# Patient Record
Sex: Male | Born: 1945 | Race: Black or African American | Hispanic: No | Marital: Married | Smoking: Former smoker
Health system: Southern US, Community
[De-identification: ages and names within clinical notes are randomized; demographics above are authoritative.]

## PROBLEM LIST (undated history)

## (undated) DIAGNOSIS — I219 Acute myocardial infarction, unspecified: Secondary | ICD-10-CM

## (undated) DIAGNOSIS — I509 Heart failure, unspecified: Secondary | ICD-10-CM

## (undated) DIAGNOSIS — K219 Gastro-esophageal reflux disease without esophagitis: Secondary | ICD-10-CM

## (undated) DIAGNOSIS — I10 Essential (primary) hypertension: Secondary | ICD-10-CM

## (undated) DIAGNOSIS — I739 Peripheral vascular disease, unspecified: Secondary | ICD-10-CM

## (undated) DIAGNOSIS — E785 Hyperlipidemia, unspecified: Secondary | ICD-10-CM

## (undated) DIAGNOSIS — E559 Vitamin D deficiency, unspecified: Secondary | ICD-10-CM

## (undated) DIAGNOSIS — I214 Non-ST elevation (NSTEMI) myocardial infarction: Secondary | ICD-10-CM

## (undated) DIAGNOSIS — I2510 Atherosclerotic heart disease of native coronary artery without angina pectoris: Secondary | ICD-10-CM

## (undated) HISTORY — DX: Non-ST elevation (NSTEMI) myocardial infarction: I21.4

## (undated) HISTORY — DX: Peripheral vascular disease, unspecified: I73.9

## (undated) HISTORY — DX: Gastro-esophageal reflux disease without esophagitis: K21.9

## (undated) HISTORY — DX: Hyperlipidemia, unspecified: E78.5

---
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
## 2001-07-07 ENCOUNTER — Ambulatory Visit (HOSPITAL_COMMUNITY): Admission: RE | Admit: 2001-07-07 | Discharge: 2001-07-07 | Payer: Self-pay | Admitting: Pulmonary Disease

## 2001-07-22 ENCOUNTER — Ambulatory Visit (HOSPITAL_COMMUNITY): Admission: RE | Admit: 2001-07-22 | Discharge: 2001-07-22 | Payer: Self-pay | Admitting: Pulmonary Disease

## 2001-07-26 ENCOUNTER — Ambulatory Visit (HOSPITAL_COMMUNITY): Admission: RE | Admit: 2001-07-26 | Discharge: 2001-07-26 | Payer: Self-pay | Admitting: Internal Medicine

## 2001-10-26 ENCOUNTER — Ambulatory Visit (HOSPITAL_COMMUNITY): Admission: RE | Admit: 2001-10-26 | Discharge: 2001-10-26 | Payer: Self-pay | Admitting: Pulmonary Disease

## 2001-11-05 ENCOUNTER — Ambulatory Visit (HOSPITAL_COMMUNITY): Admission: RE | Admit: 2001-11-05 | Discharge: 2001-11-05 | Payer: Self-pay | Admitting: Pulmonary Disease

## 2003-03-08 ENCOUNTER — Ambulatory Visit (HOSPITAL_COMMUNITY): Admission: RE | Admit: 2003-03-08 | Discharge: 2003-03-08 | Payer: Self-pay | Admitting: Urology

## 2003-03-08 ENCOUNTER — Encounter: Payer: Self-pay | Admitting: Urology

## 2004-12-16 ENCOUNTER — Ambulatory Visit (HOSPITAL_COMMUNITY): Admission: RE | Admit: 2004-12-16 | Discharge: 2004-12-16 | Payer: Self-pay | Admitting: *Deleted

## 2004-12-23 HISTORY — PX: CARDIAC CATHETERIZATION: SHX172

## 2005-02-27 ENCOUNTER — Ambulatory Visit (HOSPITAL_COMMUNITY): Admission: RE | Admit: 2005-02-27 | Discharge: 2005-02-28 | Payer: Self-pay | Admitting: Cardiovascular Disease

## 2005-02-27 HISTORY — PX: FEMORAL ARTERY STENT: SHX1583

## 2005-09-29 ENCOUNTER — Ambulatory Visit (HOSPITAL_COMMUNITY): Admission: RE | Admit: 2005-09-29 | Discharge: 2005-09-29 | Payer: Self-pay | Admitting: Internal Medicine

## 2007-08-25 ENCOUNTER — Encounter: Payer: Self-pay | Admitting: Emergency Medicine

## 2007-08-25 ENCOUNTER — Observation Stay (HOSPITAL_COMMUNITY): Admission: AD | Admit: 2007-08-25 | Discharge: 2007-08-26 | Payer: Self-pay | Admitting: Cardiology

## 2007-08-26 HISTORY — PX: CARDIAC CATHETERIZATION: SHX172

## 2008-01-14 ENCOUNTER — Ambulatory Visit (HOSPITAL_COMMUNITY): Admission: RE | Admit: 2008-01-14 | Discharge: 2008-01-14 | Payer: Self-pay | Admitting: Pulmonary Disease

## 2008-05-27 ENCOUNTER — Emergency Department (HOSPITAL_COMMUNITY): Admission: EM | Admit: 2008-05-27 | Discharge: 2008-05-27 | Payer: Self-pay | Admitting: Emergency Medicine

## 2008-08-09 ENCOUNTER — Ambulatory Visit (HOSPITAL_COMMUNITY): Admission: RE | Admit: 2008-08-09 | Discharge: 2008-08-09 | Payer: Self-pay | Admitting: Urology

## 2009-12-04 ENCOUNTER — Ambulatory Visit (HOSPITAL_COMMUNITY): Admission: RE | Admit: 2009-12-04 | Discharge: 2009-12-04 | Payer: Self-pay | Admitting: General Surgery

## 2009-12-09 HISTORY — PX: COLONOSCOPY: SHX174

## 2010-01-01 ENCOUNTER — Ambulatory Visit (HOSPITAL_COMMUNITY): Admission: RE | Admit: 2010-01-01 | Discharge: 2010-01-01 | Payer: Self-pay | Admitting: Pulmonary Disease

## 2010-01-10 ENCOUNTER — Ambulatory Visit: Payer: Self-pay | Admitting: Internal Medicine

## 2010-01-10 ENCOUNTER — Ambulatory Visit (HOSPITAL_COMMUNITY): Admission: RE | Admit: 2010-01-10 | Discharge: 2010-01-10 | Payer: Self-pay | Admitting: Internal Medicine

## 2010-01-10 DIAGNOSIS — I739 Peripheral vascular disease, unspecified: Secondary | ICD-10-CM | POA: Insufficient documentation

## 2010-01-10 DIAGNOSIS — K219 Gastro-esophageal reflux disease without esophagitis: Secondary | ICD-10-CM

## 2010-01-10 DIAGNOSIS — M79609 Pain in unspecified limb: Secondary | ICD-10-CM | POA: Insufficient documentation

## 2010-02-19 ENCOUNTER — Ambulatory Visit: Payer: Self-pay | Admitting: Internal Medicine

## 2010-02-19 ENCOUNTER — Ambulatory Visit (HOSPITAL_COMMUNITY): Admission: RE | Admit: 2010-02-19 | Discharge: 2010-02-19 | Payer: Self-pay | Admitting: Internal Medicine

## 2010-02-19 HISTORY — PX: ESOPHAGOGASTRODUODENOSCOPY: SHX1529

## 2010-06-13 ENCOUNTER — Encounter (INDEPENDENT_AMBULATORY_CARE_PROVIDER_SITE_OTHER): Payer: Self-pay | Admitting: *Deleted

## 2010-08-27 ENCOUNTER — Emergency Department (HOSPITAL_COMMUNITY)
Admission: EM | Admit: 2010-08-27 | Discharge: 2010-08-28 | Payer: Self-pay | Source: Home / Self Care | Admitting: Emergency Medicine

## 2010-08-28 ENCOUNTER — Observation Stay (HOSPITAL_COMMUNITY)
Admission: EM | Admit: 2010-08-28 | Discharge: 2010-08-29 | Payer: Self-pay | Source: Home / Self Care | Attending: Cardiovascular Disease | Admitting: Cardiovascular Disease

## 2010-08-28 ENCOUNTER — Encounter (INDEPENDENT_AMBULATORY_CARE_PROVIDER_SITE_OTHER): Payer: Self-pay | Admitting: Cardiovascular Disease

## 2010-08-28 DIAGNOSIS — I2510 Atherosclerotic heart disease of native coronary artery without angina pectoris: Secondary | ICD-10-CM

## 2010-08-28 HISTORY — DX: Atherosclerotic heart disease of native coronary artery without angina pectoris: I25.10

## 2010-08-28 HISTORY — PX: CORONARY STENT INTERVENTION: CATH118234

## 2010-08-28 HISTORY — PX: CARDIAC CATHETERIZATION: SHX172

## 2010-08-28 HISTORY — PX: LEFT HEART CATH AND CORONARY ANGIOGRAPHY: CATH118249

## 2010-08-28 LAB — DIFFERENTIAL
Basophils Absolute: 0 10*3/uL (ref 0.0–0.1)
Basophils Relative: 0 % (ref 0–1)
Eosinophils Absolute: 0.1 10*3/uL (ref 0.0–0.7)
Eosinophils Relative: 1 % (ref 0–5)
Lymphocytes Relative: 53 % — ABNORMAL HIGH (ref 12–46)
Lymphs Abs: 3 10*3/uL (ref 0.7–4.0)
Monocytes Absolute: 0.3 10*3/uL (ref 0.1–1.0)
Monocytes Relative: 5 % (ref 3–12)
Neutro Abs: 2.4 10*3/uL (ref 1.7–7.7)
Neutrophils Relative %: 42 % — ABNORMAL LOW (ref 43–77)

## 2010-08-28 LAB — CBC
HCT: 38.6 % — ABNORMAL LOW (ref 39.0–52.0)
Hemoglobin: 13.6 g/dL (ref 13.0–17.0)
MCH: 32.9 pg (ref 26.0–34.0)
MCHC: 35.2 g/dL (ref 30.0–36.0)
MCV: 93.2 fL (ref 78.0–100.0)
Platelets: 231 10*3/uL (ref 150–400)
RBC: 4.14 MIL/uL — ABNORMAL LOW (ref 4.22–5.81)
RDW: 12.6 % (ref 11.5–15.5)
WBC: 5.8 10*3/uL (ref 4.0–10.5)

## 2010-08-28 LAB — POCT CARDIAC MARKERS
CKMB, poc: <1 ng/mL — ABNORMAL LOW (ref 1.0–8.0)
CKMB, poc: 1.5 ng/mL (ref 1.0–8.0)
Myoglobin, poc: 128 ng/mL (ref 12–200)
Myoglobin, poc: 131 ng/mL (ref 12–200)
Troponin i, poc: <0.05 ng/mL (ref 0.00–0.09)
Troponin i, poc: <0.05 ng/mL (ref 0.00–0.09)

## 2010-08-28 LAB — BASIC METABOLIC PANEL
BUN: 12 mg/dL (ref 6–23)
CO2: 27 mEq/L (ref 19–32)
Calcium: 10 mg/dL (ref 8.4–10.5)
Chloride: 105 mEq/L (ref 96–112)
Creatinine, Ser: 1.41 mg/dL (ref 0.4–1.5)
GFR calc Af Amer: >60 mL/min (ref >60)
GFR calc non Af Amer: 51 mL/min — ABNORMAL LOW (ref >60)
Glucose, Bld: 97 mg/dL (ref 70–99)
Potassium: 4.2 mEq/L (ref 3.5–5.1)
Sodium: 138 mEq/L (ref 135–145)

## 2010-08-28 LAB — PROTIME-INR
INR: 1.02 (ref 0.00–1.49)
Prothrombin Time: 13.6 seconds (ref 11.6–15.2)

## 2010-08-28 LAB — APTT: aPTT: 29 seconds (ref 24–37)

## 2010-08-28 LAB — D-DIMER, QUANTITATIVE: D-Dimer, Quant: 0.22 ug/mL-FEU (ref 0.00–0.48)

## 2010-09-02 LAB — GLUCOSE, CAPILLARY
Glucose-Capillary: 100 mg/dL — ABNORMAL HIGH (ref 70–99)
Glucose-Capillary: 157 mg/dL — ABNORMAL HIGH (ref 70–99)
Glucose-Capillary: 202 mg/dL — ABNORMAL HIGH (ref 70–99)
Glucose-Capillary: 113 mg/dL — ABNORMAL HIGH (ref 70–99)
Glucose-Capillary: 62 mg/dL — ABNORMAL LOW (ref 70–99)
Glucose-Capillary: 125 mg/dL — ABNORMAL HIGH (ref 70–99)
Glucose-Capillary: 126 mg/dL — ABNORMAL HIGH (ref 70–99)
Glucose-Capillary: 171 mg/dL — ABNORMAL HIGH (ref 70–99)

## 2010-09-02 LAB — CBC
HCT: 39.2 % (ref 39.0–52.0)
HCT: 44.2 % (ref 39.0–52.0)
Hemoglobin: 13 g/dL (ref 13.0–17.0)
Hemoglobin: 14.9 g/dL (ref 13.0–17.0)
MCH: 31.7 pg (ref 26.0–34.0)
MCHC: 33.2 g/dL (ref 30.0–36.0)
MCHC: 33.7 g/dL (ref 30.0–36.0)
MCV: 95.6 fL (ref 78.0–100.0)
Platelets: 234 10*3/uL (ref 150–400)
RBC: 4.1 MIL/uL — ABNORMAL LOW (ref 4.22–5.81)
RDW: 12.5 % (ref 11.5–15.5)
WBC: 5.8 10*3/uL (ref 4.0–10.5)
WBC: 7.3 10*3/uL (ref 4.0–10.5)

## 2010-09-02 LAB — BASIC METABOLIC PANEL
Calcium: 9.7 mg/dL (ref 8.4–10.5)
GFR calc Af Amer: 59 mL/min — ABNORMAL LOW (ref >60)
GFR calc non Af Amer: 49 mL/min — ABNORMAL LOW (ref >60)
Potassium: 3.8 mEq/L (ref 3.5–5.1)
Sodium: 137 mEq/L (ref 135–145)

## 2010-09-02 LAB — MRSA PCR SCREENING: MRSA by PCR: NEGATIVE

## 2010-09-02 LAB — T4, FREE: Free T4: 0.89 ng/dL (ref 0.80–1.80)

## 2010-09-02 LAB — CARDIAC PANEL(CRET KIN+CKTOT+MB+TROPI)
CK, MB: 3.4 ng/mL (ref 0.3–4.0)
CK, MB: 3.5 ng/mL (ref 0.3–4.0)
Relative Index: 1 (ref 0.0–2.5)
Total CK: 324 U/L — ABNORMAL HIGH (ref 7–232)
Troponin I: 0.01 ng/mL (ref 0.00–0.06)

## 2010-09-02 LAB — TSH: TSH: 1.218 u[IU]/mL (ref 0.350–4.500)

## 2010-09-02 LAB — MAGNESIUM: Magnesium: 2 mg/dL (ref 1.5–2.5)

## 2010-09-02 LAB — HEMOGLOBIN A1C: Mean Plasma Glucose: 140 mg/dL — ABNORMAL HIGH (ref <117)

## 2010-09-02 LAB — BRAIN NATRIURETIC PEPTIDE: Pro B Natriuretic peptide (BNP): <30 pg/mL (ref 0.0–100.0)

## 2010-09-05 LAB — POCT ACTIVATED CLOTTING TIME: Activated Clotting Time: 435 seconds

## 2010-09-10 ENCOUNTER — Observation Stay (HOSPITAL_COMMUNITY)
Admission: EM | Admit: 2010-09-10 | Discharge: 2010-09-11 | Disposition: A | Discharge status: Home / Self Care | Payer: 59 | Attending: Internal Medicine | Admitting: Internal Medicine

## 2010-09-10 DIAGNOSIS — R079 Chest pain, unspecified: Principal | ICD-10-CM | POA: Insufficient documentation

## 2010-09-10 DIAGNOSIS — E785 Hyperlipidemia, unspecified: Secondary | ICD-10-CM | POA: Insufficient documentation

## 2010-09-10 DIAGNOSIS — R0602 Shortness of breath: Secondary | ICD-10-CM | POA: Insufficient documentation

## 2010-09-10 DIAGNOSIS — E119 Type 2 diabetes mellitus without complications: Secondary | ICD-10-CM | POA: Insufficient documentation

## 2010-09-10 DIAGNOSIS — J449 Chronic obstructive pulmonary disease, unspecified: Secondary | ICD-10-CM | POA: Insufficient documentation

## 2010-09-10 DIAGNOSIS — I509 Heart failure, unspecified: Secondary | ICD-10-CM | POA: Insufficient documentation

## 2010-09-10 DIAGNOSIS — I2510 Atherosclerotic heart disease of native coronary artery without angina pectoris: Secondary | ICD-10-CM | POA: Insufficient documentation

## 2010-09-10 DIAGNOSIS — J4489 Other specified chronic obstructive pulmonary disease: Secondary | ICD-10-CM | POA: Insufficient documentation

## 2010-09-10 DIAGNOSIS — R42 Dizziness and giddiness: Secondary | ICD-10-CM | POA: Insufficient documentation

## 2010-09-10 LAB — COMPREHENSIVE METABOLIC PANEL WITH GFR
ALT: 21 U/L (ref 0–53)
AST: 22 U/L (ref 0–37)
Albumin: 4 g/dL (ref 3.5–5.2)
Alkaline Phosphatase: 39 U/L (ref 39–117)
Chloride: 104 meq/L (ref 96–112)
GFR calc Af Amer: >60 mL/min (ref >60)
Potassium: 3.8 meq/L (ref 3.5–5.1)
Sodium: 137 meq/L (ref 135–145)
Total Bilirubin: 1.1 mg/dL (ref 0.3–1.2)

## 2010-09-10 LAB — URINALYSIS, ROUTINE W REFLEX MICROSCOPIC
Ketones, ur: NEGATIVE mg/dL
Nitrite: NEGATIVE
Protein, ur: NEGATIVE mg/dL
Urine Glucose, Fasting: NEGATIVE mg/dL
Urobilinogen, UA: 0.2 mg/dL (ref 0.0–1.0)

## 2010-09-10 LAB — CBC
HCT: 37.4 % — ABNORMAL LOW (ref 39.0–52.0)
Hemoglobin: 12.9 g/dL — ABNORMAL LOW (ref 13.0–17.0)
Hemoglobin: 12.3 g/dL — ABNORMAL LOW (ref 13.0–17.0)
MCH: 31.9 pg (ref 26.0–34.0)
MCHC: 34.5 g/dL (ref 30.0–36.0)
MCHC: 34 g/dL (ref 30.0–36.0)
MCV: 92.6 fL (ref 78.0–100.0)
Platelets: 206 K/uL (ref 150–400)
Platelets: 184 10*3/uL (ref 150–400)
RBC: 4.04 MIL/uL — ABNORMAL LOW (ref 4.22–5.81)
RDW: 12.2 % (ref 11.5–15.5)
RDW: 12.4 % (ref 11.5–15.5)
WBC: 5.6 10*3/uL (ref 4.0–10.5)

## 2010-09-10 LAB — DIFFERENTIAL
Basophils Absolute: 0 10*3/uL (ref 0.0–0.1)
Basophils Relative: 0 % (ref 0–1)
Eosinophils Absolute: 0 K/uL (ref 0.0–0.7)
Eosinophils Relative: 0 % (ref 0–5)
Lymphocytes Relative: 35 % (ref 12–46)
Lymphs Abs: 2 K/uL (ref 0.7–4.0)
Monocytes Absolute: 0.3 10*3/uL (ref 0.1–1.0)
Monocytes Relative: 6 % (ref 3–12)
Neutro Abs: 3.3 10*3/uL (ref 1.7–7.7)
Neutrophils Relative %: 59 % (ref 43–77)

## 2010-09-10 LAB — COMPREHENSIVE METABOLIC PANEL
BUN: 11 mg/dL (ref 6–23)
CO2: 26 mEq/L (ref 19–32)
Calcium: 9.9 mg/dL (ref 8.4–10.5)
Creatinine, Ser: 1.05 mg/dL (ref 0.4–1.5)
GFR calc non Af Amer: >60 mL/min (ref >60)
Glucose, Bld: 138 mg/dL — ABNORMAL HIGH (ref 70–99)
Total Protein: 6.9 g/dL (ref 6.0–8.3)

## 2010-09-10 LAB — PROTIME-INR
INR: 1.1 (ref 0.00–1.49)
Prothrombin Time: 14.4 seconds (ref 11.6–15.2)

## 2010-09-10 LAB — APTT: aPTT: 26 seconds (ref 24–37)

## 2010-09-10 LAB — POCT CARDIAC MARKERS
CKMB, poc: 1.7 ng/mL (ref 1.0–8.0)
Myoglobin, poc: 77.2 ng/mL (ref 12–200)

## 2010-09-10 LAB — TROPONIN I: Troponin I: <0.01 ng/mL (ref 0.00–0.06)

## 2010-09-10 LAB — GLUCOSE, CAPILLARY
Glucose-Capillary: 130 mg/dL — ABNORMAL HIGH (ref 70–99)
Glucose-Capillary: 140 mg/dL — ABNORMAL HIGH (ref 70–99)

## 2010-09-10 NOTE — Letter (Signed)
Summary: EGD ORDER  EGD ORDER   Imported By: Ave Filter 01/10/2010 11:53:33  _____________________________________________________________________  External Attachment:    Type:   Image     Comment:   External Document

## 2010-09-10 NOTE — Letter (Signed)
Summary: STAT DOPPLER LEG ORDER  STAT DOPPLER LEG ORDER   Imported By: Ave Filter 01/10/2010 11:52:44  _____________________________________________________________________  External Attachment:    Type:   Image     Comment:   External Document

## 2010-09-10 NOTE — Letter (Signed)
Summary: Recall Office Visit  [facility]  [address]   [place], [place] [address]   Phone: [phone]  Fax: [phone]      June 13, 2010   [name] [address] [place], [place]  [address] 13-Aug-1945   Dear [name],   According to our records, it is time for you to schedule a follow-up office visit with [place].   At your convenience, please call [phone] to schedule an office visit. If you have any questions, concerns, or feel that this letter is in error, we would appreciate your call.   Sincerely,    Diana Eves  [facility] Ph: [phone]   Fax: [phone]

## 2010-09-10 NOTE — Assessment & Plan Note (Signed)
Summary: ACID REFLUX,HURTS TO SWALLOW/SS   Visit Type:  Initial Consult Referring Provider:  Dr Juanetta Gosling Primary Care Provider:  Dr Rulon Sera  Chief Complaint:  acid reflux and burning in throat.  History of Present Illness: 65 y/o black male w/ hx GERD over 10 yrs.  Well-controlled on pantroprazole x 5 yrs.  Started fenofribic acid x 55month ago.  Started having breakthrough chest pain, indigestion, heartburn worse than ever before.  Saw Dr Juanetta Gosling and took z-pack & prednisone, off x 1 week now.  Lots of belching.  Denies dysphagia or odynophagia.  c/o nausea without vomiting.  Occ has regurg.  Appetite ok.  Denies lower abd pain.  BM q1-2 days, without rectal bleeding or melena.  Wt loss 15# intentionally w/ walking 3 mi per day & cutting back on calories.  Taking Gaviscon & pepcid w good relief.  Cut back on daily caffeine use.  Also c/o severe left calf pain, worse w/ movement x 3 days.  Denies redness, swelling or warmth.  Has not been seen or told Dr Juanetta Gosling yet.  Hx PVD, followed by Dr Allyson Sabal.    Current Problems (verified): 1)  Pvd  (ICD-443.9) 2)  Gastroesophageal Reflux Disease, Severe  (ICD-530.81) 3)  Calf Pain, Left  (ICD-729.5)  Current Medications (verified): 1)  Ramipril 5 Mg Caps (Ramipril) .... Once Daily 2)  Glyburide-Metformin 5-500 Mg Tabs (Glyburide-Metformin) .... Once Daily 3)  Plavix 75 Mg Tabs (Clopidogrel Bisulfate) .... Once Daily 4)  Fexofenadine Hcl 180 Mg Tabs (Fexofenadine Hcl) .... Once Daily 5)  Pantoprazole Sodium 40 Mg Tbec (Pantoprazole Sodium) .... Once Daily 6)  Trilipix 135 Mg Cpdr (Choline Fenofibrate) .... Once Daily  Allergies (verified): 1)  ! Darvocet 2)  ! Pcn 3)  ! Diflucan (Fluconazole)  Past History:  Past Medical History: PVD s/p stent left leg Diabetes Hyperlipidemia Hypertension Colonoscopy Dr Lovell Sheehan 12/2009->normal 2002 Dr Elmer Ramp GERD  Past Surgical History: Unremarkable  Family History: No known family history of  colorectal carcinoma, IBD, liver or chronic GI problems.  Social History: married 3 grown healthy children [employer] Alcohol Use - no Daily Caffeine Use Patient is a former smoker. 50 pkyr, quit 23yrs ago Illicit Drug Use - no Patient gets regular exercise. Smoking Status:  quit Drug Use:  no Does Patient Exercise:  yes  Review of Systems General:  Complains of fatigue; denies fever, chills, sweats, anorexia, weakness, malaise, weight loss, and sleep disorder. CV:  Denies angina, palpitations, syncope, dyspnea on exertion, orthopnea, PND, peripheral edema, and claudication. Resp:  Denies dyspnea at rest, dyspnea with exercise, cough, sputum, wheezing, coughing up blood, and pleurisy. GI:  Denies difficulty swallowing, pain on swallowing, jaundice, gas/bloating, bloody BM's, black BMs, and fecal incontinence. GU:  Denies urinary burning, blood in urine, urinary frequency, urinary hesitancy, nocturnal urination, and urinary incontinence. MS:  Denies joint pain / LOM, joint swelling, joint stiffness, joint deformity, low back pain, muscle weakness, muscle cramps, muscle atrophy, leg pain at night, leg pain with exertion, and shoulder pain / LOM hand / wrist pain (CTS); c/o left calf pain x 3 days, no swelling or erythema. Derm:  Denies rash, itching, dry skin, hives, moles, warts, and unhealing ulcers. Psych:  Denies depression, anxiety, memory loss, suicidal ideation, hallucinations, paranoia, phobia, and confusion. Heme:  Denies bruising, bleeding, and enlarged lymph nodes.  Vital Signs:  Patient profile:   65 year old male Height:      72 inches Weight:      225 pounds  BMI:     30.63 Temp:     97.7 degrees F oral Pulse rate:   60 / minute BP sitting:   128 / 84  (left arm) Cuff size:   regular  Vitals Entered By: Hendricks Limes LPN (January 10, 1609 11:02 AM)  Physical Exam  General:  Well developed, well nourished, no acute distress. Head:  Normocephalic and  atraumatic. Eyes:  Sclera clear, no icterus. Ears:  Normal auditory acuity. Nose:  No deformity, discharge,  or lesions. Mouth:  No deformity or lesions, dentition normal. Neck:  Supple; no masses or thyromegaly. Lungs:  Clear throughout to auscultation. Heart:  Regular rate and rhythm; no murmurs, rubs,  or bruits. Abdomen:  Soft, nontender and nondistended. No masses, hepatosplenomegaly or hernias noted. Normal bowel sounds.without guarding and without rebound.   Msk:  Symmetrical with no gross deformities. Normal posture. Pulses:  Normal pulses noted. Extremities:  Post calf tenderness on exam left LE, no warmth or erythema.  Negative Homan's.   trace ankle edma B/ Neurologic:  Alert and  oriented x4;  grossly normal neurologically. Skin:  Intact without significant lesions or rashes. Cervical Nodes:  No significant cervical adenopathy. Psych:  Alert and cooperative. Normal mood and affect.  Impression & Recommendations:  Problem # 1:  GASTROESOPHAGEAL REFLUX DISEASE, SEVERE (ICD-530.81) 65 y/o black male here for evaluation of refractory GERD.  Will need EGD to r/o erosive/ulcerative esophagitis, PUD & malignancy.  EGD to be performed by Dr. Jonathon Bellows in the near future.  I have discussed risks and benefits which include, but are not limited to, bleeding, infection, perforation, or medication reaction.  The patient agrees with this plan and consent will be obtained.  Orders: Consultation Level III (96045)  Problem # 2:  CALF PAIN, LEFT (ICD-729.5) Stat doppler Ultrasound today to r/o DVT Left lower extremity at [facility] revealed SVT greater saphenous.  I contacted Dr Juanetta Gosling @1330  who will follow-up w/ management.

## 2010-09-11 ENCOUNTER — Observation Stay (HOSPITAL_COMMUNITY): Payer: 59

## 2010-09-11 HISTORY — PX: CARDIAC CATHETERIZATION: SHX172

## 2010-09-11 HISTORY — PX: LEFT HEART CATH AND CORONARY ANGIOGRAPHY: CATH118249

## 2010-09-11 LAB — CARDIAC PANEL(CRET KIN+CKTOT+MB+TROPI): Troponin I: <0.01 ng/mL (ref 0.00–0.06)

## 2010-09-11 LAB — GLUCOSE, CAPILLARY
Glucose-Capillary: 128 mg/dL — ABNORMAL HIGH (ref 70–99)
Glucose-Capillary: 149 mg/dL — ABNORMAL HIGH (ref 70–99)
Glucose-Capillary: 153 mg/dL — ABNORMAL HIGH (ref 70–99)

## 2010-09-11 LAB — CBC
MCHC: 33.3 g/dL (ref 30.0–36.0)
Platelets: 177 10*3/uL (ref 150–400)
RDW: 12.7 % (ref 11.5–15.5)
WBC: 5.8 10*3/uL (ref 4.0–10.5)

## 2010-09-11 LAB — MRSA PCR SCREENING: MRSA by PCR: NEGATIVE

## 2010-09-16 NOTE — Procedures (Signed)
NAMEOVIE, CORNELIO NO.:  [id number]  MEDICAL RECORD NO.:  [id number]           PATIENT TYPE:  I  LOCATION:  2927                         FACILITY:  [facility]  PHYSICIAN:  Landry Corporal, MD DATE OF BIRTH:  June 13, 1946  DATE OF PROCEDURE:  09/11/2010 DATE OF DISCHARGE:  09/11/2010                           CARDIAC CATHETERIZATION   PRIMARY CARDIOLOGIST:  Nanetta Batty, MD  PRIMARY CARE PHYSICIAN:  Annia Friendly. Hill, MD in [place], [place].  PROCEDURE PERFORMED:  Selective native coronary angiography.  INDICATIONS:  Chest pain status post recent PCI.  BRIEF HISTORY:  Mr. Valdes is a 65 year old gentleman with a history of peripheral vascular disease status post stenting two of his SFAs in the past.  He was admitted on the August 27, 2010, with signs and symptoms concerning for unstable angina.  He did rule out for myocardial infarction, but he did undergo cardiac catheterization where he was noted to have a 99% stenosis in the first obtuse marginal branch of a large circumflex system.  This was treated with a Mini Vision 2.0 x 50 mm bare-metal stent of size 2.25 mm with a noncompliant balloon.  The patient has noted having some nagging chest discomfort since discharge, and came in the day preceding this procedure with the more episodes of chest discomfort.  He is referred for diagnostic catheterization to reassess the status of his recent stent that it shows no complications.  The procedure with risks, benefits, alternatives, and indication of procedure were explained to the patient in detail.  Informed consent was obtained with a signed form placed on the chart, and the patient's questions answered.  PROCEDURE:  The patient was brought to the second floor of [facility] cardiac  catheterization lab, prepped and draped in usual sterile fashion.  After a modified Allen/Barbeau, the study was performed demonstrating adequate perfusion of the right hand via the  ulnar artery collaterals.  The right wrist was then draped out for radial access. The patient was sedated with intravenous Versed and fentanyl, and the right wrist was anesthetized using 1% subcutaneous lidocaine, and the right radial artery was accessed using the Seldinger technique with placement of 5-French Glidesheath via micropuncture access.  The sheath was aspirated, flushed, and infiltrated with 10 mL of standard radial cocktail as described below.  The patient had been given treatment of enoxaparin subcutaneously in the morning and therefore was not given any additional anticoagulation for the procedure.  A 5-French TIG 4.0 diagnostic catheter was advanced over a wire into the ascending aorta and was used to engage first the left and right coronary artery.  Multiple angiographic views of the left and right coronary artery system were obtained.  The catheter was then removed completely out of the body over a wire without any complications.  Left heart catheterization with left ventriculogram was not performed as he just had one done recently.  There is no evidence of pullback gradient at that time, and there was normal ejection fraction noted on left ventriculogram.  After the catheter was removed completely out of the body, the sheath was removed with a TR band  placed at 12 o'clock noon with 15 mL of air.  The plan will be for this to be removed sccording to the post PCI protocol, since the patient was given treatment doses of Lovenox.  CATHETERIZATION STATISTICS: 1. For sedation, a total of 1 mg IV Versed and 50 mcg of IV fentanyl     were given. 2. Contrast, 30 mL was given. 3. Radial cocktail consisting of 400 mcg of nitroglycerin, 2 mL of 1%     lidocaine, and 5 mg of Versed diluted in 10 mL of normal saline.  HEMODYNAMICS:  The aortic pressure was 88/59 mmHg with a mean of  72 mmHg.  SELECTIVE CORONARY ANGIOGRAPHY DATA: 1. Left main was a short vessel that bifurcates  immediately into a     large caliber LAD and then circumflex branch. 2. LAD is angiographically normal and is very tortuous in this     proximal nature.  It gives rise to two diagonal branches.  The     second of which is more prominent.  There is a slight myocardial     bridging just after the second diagonal branch, but no significant     stenosis noted.  There is no significant angiographic disease in     the LAD system. 3. Left circumflex gives rise to several marked large caliber obtuse     marginal branches, the first of which underwent PCI on August 28, 2010.  The stent in this vessel was widely patent with brisk TIMI 3     flow.  There is mild impingement of a small superior branch in     this, but there is still TIMI 3 flow in this vessel.  The rest of     the circumflex system has minimal luminal irregularities in the     interventricular groove circumflex goes on to provide what appeared     to be part of the left posterolateral system in almost a codominant     manner.  The second obtuse marginal is a large branching vessel     reached down near the inferior border of the heart and is free of     any significant disease. 4. Right coronary artery is a moderate caliber vessel.  It does appear     to be dominant, and has only minimal luminal irregularities and a     small posterolateral system, and posterior descending artery.  IMPRESSION: 1. Widely patent bare-metal stent to the first obtuse marginal with     TIMI 3 flow distally. 2. No notable additional coronary artery disease to suggest any     coronary artery disease explanation for his chest pain as an     anginal equivalent.  RECOMMENDATIONS: 1. He will receive a standard post radial care, and the TR band     removed according to the post PCI protocol based on his Lovenox     treatment.  He will be transferred to the 2900 step-down unit just     for bed availability.  We will likely discharge later on today,  and     he will follow up with Dr. Allyson Sabal in a week or two at the [facility]     office at the [facility]. 2. He also was borderline hypotensive and somewhat dizzy with that.     We may actually need to back off his beta-blocker dose and     potentially discontinue the beta-blocker dose on discharge  as he     was notably dizzy with this prior to admission.          ______________________________ Landry Corporal, MD     DWH/MEDQ  D:  09/11/2010  T:  09/12/2010  Job:  [id number]  cc:   Nanetta Batty, M.D. Annia Friendly. Loleta Chance, MD  Electronically Signed by Bryan Lemma MD on 09/16/2010 12:26:18 AM

## 2010-09-20 ENCOUNTER — Ambulatory Visit (HOSPITAL_COMMUNITY): Payer: 59 | Attending: Cardiovascular Disease

## 2010-09-20 DIAGNOSIS — Z9861 Coronary angioplasty status: Secondary | ICD-10-CM | POA: Insufficient documentation

## 2010-09-20 DIAGNOSIS — I2510 Atherosclerotic heart disease of native coronary artery without angina pectoris: Secondary | ICD-10-CM | POA: Insufficient documentation

## 2010-09-20 DIAGNOSIS — I200 Unstable angina: Secondary | ICD-10-CM | POA: Insufficient documentation

## 2010-09-20 DIAGNOSIS — Z5189 Encounter for other specified aftercare: Secondary | ICD-10-CM | POA: Insufficient documentation

## 2010-09-20 NOTE — Procedures (Signed)
NAMEADRIELL, [name]                 ACCOUNT NO.:  [id number]  MEDICAL RECORD NO.:  [id number]          PATIENT TYPE:  INP  LOCATION:  2925                         FACILITY:  [facility]  PHYSICIAN:  Nanetta Batty, M.D.   DATE OF BIRTH:  1946/05/27  DATE OF PROCEDURE:  08/28/2010 DATE OF DISCHARGE:                           CARDIAC CATHETERIZATION   PROCEDURE:  Cardiac catheterization/percutaneous coronary intervention stent placement.  OPERATOR:  Nanetta Batty, MD  INDICATIONS FOR PROCEDURE:  [name] is a 65 year old mildly overweight African American male admitted 1:30 this morning with unstable angina. His history is remarkable for known PVOD status post stenting of his SFAs.  His other problems include diabetes, hypertension, and hypertension.  He ruled out for myocardial infarction.  He presents now for diagnostic coronary arteriography to define his anatomy and rule out ischemic etiology.  DESCRIPTION OF PROCEDURE:  The patient was brought to the second floor [place] cardiac cath lab in a postabsorptive state.  He was premedicated with p.o. Valium, IV Versed, and fentanyl.  His right groin was prepped and shaved in the usual sterile fashion.  Xylocaine 1% was used for local anesthesia.  A 5-French sheath was inserted into the right femoral artery using the standard Seldinger technique.  A 5-French right and left Judkins diagnostic catheters as well as a 5-French pigtail catheter were used for selective coronary angiography, left ventriculography respectively.  Visipaque dye was used for the entirety of the case.  Retrograde aortic, left ventricular pullback pressures were recorded.  HEMODYNAMICS: 1. Aortic systolic pressure 98 and diastolic pressure 59. 2. Left ventricular systolic pressure 103 and end-diastolic pressure     12.  SELECTIVE CORONARY ANGIOGRAPHY: 1. Left main normal. 2. LAD normal. 3. Left circumflex; left circumflex gave off several moderate-sized   marginal branches.  The first marginal branch had 99% proximal     stenosis with TIMI 1 flow. 4. Right coronary; dominant and free of significant disease. 5. Left ventriculography; RAO left ventriculogram was performed using     25 mL of Visipaque dye at 12 mL per second.  The overall LVEF is     estimated at greater than 60% without focal wall motion     abnormalities.  IMPRESSION:  [name] has a high-grade obtuse marginal 1 lesion, probably responsible for symptoms.  We will proceed with percutaneous coronary intervention and stenting.  The 5-French sheath was exchanged over wire for a 6-French sheath.  The patient was on aspirin and Plavix, received an additional 300 mg of p.o. Plavix and Angiomax bolus with an ACT of 435.  Using a 6-French XB-4 guide catheter along with 0.014 x 190 [employer] soft wire and 2.0 x 12 TREK angioplasty was performed.  Stenting was performed with a 2.0 x 15 Mini Vision at 14 atmospheres and postdilatation with a 2.25 x 12 [place] TREK at 16 atmospheres (2.33 mm) resulting reduction of 99% stenosis to 0% residual.  The patient tolerated the procedure well without hemodynamic or electrocardiographic sequelae.  IMPRESSION:  Successful percutaneous coronary intervention and stenting of the obtuse marginal 1 using bare metal stent and Angiomax.  The guidewire  and catheter were removed.  The sheath was sewn securely in place.  The patient left the lab in stable condition.  He will be gently hydrated overnight, treated with aspirin and Plavix, and discharged home in the morning.  He left the lab in stable condition.     Nanetta Batty, M.D.     JB/MEDQ  D:  08/28/2010  T:  08/29/2010  Job:  [id number]  cc:   Second Floor [facility] Annia Friendly. Loleta Chance, MD  Electronically Signed by Nanetta Batty M.D. on 09/20/2010 08:06:30 AM

## 2010-09-20 NOTE — Discharge Summary (Signed)
[name], [name]                 ACCOUNT NO.:  [id number]  MEDICAL RECORD NO.:  [id number]           PATIENT TYPE:  I  LOCATION:  2927                         FACILITY:  [facility]  PHYSICIAN:  Nanetta Batty, M.D.   DATE OF BIRTH:  Jul 17, 1946  DATE OF ADMISSION:  09/10/2010 DATE OF DISCHARGE:  09/11/2010                              DISCHARGE SUMMARY   DISCHARGE DIAGNOSES: 1. Chest pain, worrisome for unstable angina, catheterization this     admission revealing patent first obtuse marginal site. 2. Known coronary artery disease with recent admission and unstable     angina with subsequent catheterization and first obtuse marginal     stenting with a drug-eluting stent on August 28, 2010. 3. Type 2 non-insulin-dependent diabetes. 4. Dyslipidemia with statin intolerance. 5. Preserved left ventricular function. 6. Chronic obstructive pulmonary disease by x-ray.  HOSPITAL COURSE:  [name] is a pleasant 65 year old male who was just admitted in January 2012, with unstable angina as a transfer from [facility].  He was cathed.  He had a 99% OM, but no other significant disease.  He underwent OM dilatation and stenting by Dr. Allyson Sabal on August 28, 2010.  He was discharged home a day or so later in stable condition.  We did feel he had some bronchitis and sent him home on Z- Pak for a couple of days.  He saw Dr. Rennis Golden in the office on September 09, 2010, and had complained of a nagging left-sided ache.  Dr. Rennis Golden reassured him that this was different from his anginal symptoms and the patient agreed with that assessment.  The next day on September 10, 2010, he mentioned to someone at work that he had left-sided chest pain.  They suggested that he take a nitroglycerin.  He did this and he says his symptoms got better and his coworkers suggested that he come to the emergency room.  He was seen in the emergency room.  His EKG showed no acute changes.  His enzymes were negative.  He was started  on Lovenox and admitted.  Initial plan was to proceed with a [facility], but the patient was seen by Dr. Herbie Baltimore the next morning on September 11, 2010, and was still having chest pain, so we decided to proceed with diagnostic catheterization.  This showed patency of the OM1 site.  The patient will be discharged later today.  He did have a radial cath.  He had received his Lovenox the morning of this case, so he will be kept until 2112 p.m.  We did add a very low-dose beta-blocker as he has baseline bradycardia.  LABORATORY DATA:  White count 5.8, hemoglobin 11.9, hematocrit 35.7, platelets 177.  Sodium 137, potassium 3.8, BUN 11, creatinine 1.05. LFTs are normal.  CK-MB and troponins are negative x4.  Urinalysis is unremarkable.  Chest x-ray shows COPD with no other acute changes.  EKG shows sinus rhythm, sinus bradycardia without acute changes.  DISPOSITION:  The patient is discharged in stable condition.  The patient wants to follow up with Dr. Allyson Sabal in [place] and this will be arranged.  Please see med  rec for complete discharge medications.     Abelino Derrick, P.A.   ______________________________ Nanetta Batty, M.D.    Lenard Lance  D:  09/11/2010  T:  09/12/2010  Job:  [id number]  Electronically Signed by Corine Shelter P.A. on 09/17/2010 12:28:25 PM Electronically Signed by Nanetta Batty M.D. on 09/20/2010 08:06:32 AM

## 2010-09-23 ENCOUNTER — Ambulatory Visit (HOSPITAL_COMMUNITY): Payer: 59 | Attending: Cardiovascular Disease

## 2010-09-23 DIAGNOSIS — Z5189 Encounter for other specified aftercare: Secondary | ICD-10-CM | POA: Insufficient documentation

## 2010-09-23 DIAGNOSIS — I200 Unstable angina: Secondary | ICD-10-CM | POA: Insufficient documentation

## 2010-09-23 DIAGNOSIS — I2510 Atherosclerotic heart disease of native coronary artery without angina pectoris: Secondary | ICD-10-CM | POA: Insufficient documentation

## 2010-09-23 DIAGNOSIS — Z9861 Coronary angioplasty status: Secondary | ICD-10-CM | POA: Insufficient documentation

## 2010-09-25 ENCOUNTER — Ambulatory Visit (HOSPITAL_COMMUNITY): Payer: 59 | Attending: Cardiovascular Disease

## 2010-09-25 DIAGNOSIS — Z5189 Encounter for other specified aftercare: Secondary | ICD-10-CM | POA: Insufficient documentation

## 2010-09-25 DIAGNOSIS — I2510 Atherosclerotic heart disease of native coronary artery without angina pectoris: Secondary | ICD-10-CM | POA: Insufficient documentation

## 2010-09-25 DIAGNOSIS — Z9861 Coronary angioplasty status: Secondary | ICD-10-CM | POA: Insufficient documentation

## 2010-09-25 DIAGNOSIS — I200 Unstable angina: Secondary | ICD-10-CM | POA: Insufficient documentation

## 2010-09-25 NOTE — H&P (Signed)
[name], [name]                 ACCOUNT NO.:  [id number]  MEDICAL RECORD NO.:  [id number]          PATIENT TYPE:  OBV  LOCATION:  1830                         FACILITY:  [facility]  PHYSICIAN:  [place] Kaylon Laroche, MD         DATE OF BIRTH:  1945/11/14  DATE OF ADMISSION:  09/10/2010 DATE OF DISCHARGE:                             HISTORY & PHYSICAL   CHIEF COMPLAINT:  Chest pain.  HISTORY OF PRESENT ILLNESS:  Mr. 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  He was recently admitted August 28, 2010 as a transfer from [facility].  He presented there with complaints of substernal chest pain and dyspnea on exertion for several days.  He was admitted and had diagnostic catheterization which revealed a 99% OM1 but no other significant disease.  The OM1 was dilated and stented by Dr. Allyson Sabal.  The patient felt improved but continues to have some nagging left upper chest pain.  He does not have dyspnea on exertion or exertional chest pain.  He does say when he takes nitroglycerin it feels better.  He mentioned this at work today and they told him to come to the emergency room where he is now seen.  His troponins are negative. He is admitted now for further evaluation.  PAST MEDICAL HISTORY:  Remarkable for: 1. Previous catheterization July 2009 which showed mild coronary     artery disease in the LAD. 2. He has had a previous left lower extremity PTA in 2005. 3. He has dyslipidemia but is intolerant to statins. 4. He has type 2 non-insulin-dependent diabetes.  MEDICATIONS:  His current medications are: 1. Allegra p.r.n. 2. Altace 5 mg a day. 3. Aspirin 325 mg a day. 4. Dexilant 30 mg a day. 5. Glyburide/metformin 5/500 b.i.d. 6. Lopressor 25 b.i.d. 7. Plavix 75 mg a day.  ALLERGIES:  He has reported allergies to PENICILLIN, DARVOCET, and LEVAQUIN.  SOCIAL HISTORY:  He is a nonsmoker, he is here with his son.  FAMILY HISTORY:  Unremarkable for immediate family coronary  artery disease.  REVIEW OF SYSTEMS:  Essentially unremarkable, except noted above.  PHYSICAL EXAMINATION:  VITAL SIGNS:  Blood pressure 140/70, pulse 86, temperature 98.3. GENERAL:  He is a well-developed, well-nourished African American male in no acute distress. HEENT: Normocephalic.  Extraocular movements are intact.  Sclerae are anicteric.  Lids and conjunctivae are within normal limits.  NECK: Without bruit or JVD. CHEST:  Clear to auscultation, percussion. CARDIAC:  Regular rate and rhythm without murmur, rub, or gallop. Normal S1. S2. ABDOMEN:  Not tender, not distended. EXTREMITIES:  Without edema.  He has a small area of ecchymosis in the right femoral artery site but no hematoma.  Distal pulses are intact. NEURO:  Grossly intact.  He is awake, alert, oriented, cooperative. Moves all extremities without obvious deficit. SKIN:  Cool and dry.  LABORATORY DATA:  Sodium 137, potassium 3.8, BUN 11, creatinine 1.05. White count 5.6, hemoglobin 12.9, hematocrit 37.4, platelets 206. Troponin is negative.  Chest x-ray shows COPD.  EKG shows sinus rhythm without acute changes.  IMPRESSION: 1.  Left chest pain, rule out cardiac. 2. Recent admission with unstable angina by history and subsequent     catheterization in obtuse marginal 1 stenting with a drug-eluting     stent. 3. Type 2 non-insulin-dependent diabetes. 4. Dyslipidemia with statin intolerance. 5. Renal insufficiency, his creatinine was 1.46 at discharge, although     today it is 1.05. 6. Preserved left ventricular function. 7. Chronic obstructive pulmonary disease by chest x-ray  PLAN:  The patient will be admitted for observation.  He may need diagnostic catheterization versus Lexiscan Myoview pending his enzymes.     Abelino Derrick, P.A.   ______________________________ [place] Annamary Buschman, MD    LKK/MEDQ  D:  09/10/2010  T:  09/10/2010  Job:  [id number]  cc:   Annia Friendly. Loleta Chance, MD  Electronically Signed by Corine Shelter P.A. on 09/17/2010 12:28:10 PM Electronically Signed by Kirtland Bouchard. Bryn Saline M.D. on 09/25/2010 10:10:08 AM

## 2010-09-25 NOTE — Discharge Summary (Signed)
  NAMECRAYTON, SAVARESE                 ACCOUNT NO.:  [id number]  MEDICAL RECORD NO.:  [id number]           PATIENT TYPE:  I  LOCATION:  2927                         FACILITY:  [facility]  PHYSICIAN:  [place] Anarie Kalish, MD         DATE OF BIRTH:  Feb 22, 1946  DATE OF ADMISSION:  09/10/2010 DATE OF DISCHARGE:  09/11/2010                              DISCHARGE SUMMARY   ADDENDUM  Please note, Mr. Catania is aware that he will be out of work, so he will not return to work until after his visit with Dr. Allyson Sabal in [place], September 20, 2010 at 1:45 p.m. and our office is faxing a work note to that office as I dictate this.  This will give Mr. Tucciarone a time to recover from his bronchitis and recover from any chest pain that he may have.  The patient is agreeable to that amount of time out of work.  He would like to do cardiac rehab, unsure if he will be able to with his work schedule.  If he is able to get into a rehab program the week of February 6, then he can go ahead and start cardiac rehab, but not to go to work until after February 10.     Darcella Gasman. Annie Paras, N.P.   ______________________________ [place] Gaelan Glennon, MD    LRI/MEDQ  D:  09/11/2010  T:  09/12/2010  Job:  [id number]  cc:   Nanetta Batty, M.D. Annia Friendly. Loleta Chance, MD  Electronically Signed by Nada Boozer N.P. on 09/17/2010 10:53:57 AM Electronically Signed by Kirtland Bouchard. Yocelin Vanlue M.D. on 09/25/2010 10:09:56 AM

## 2010-09-27 ENCOUNTER — Encounter (HOSPITAL_COMMUNITY): Payer: 59

## 2010-09-30 ENCOUNTER — Ambulatory Visit (HOSPITAL_COMMUNITY): Payer: 59

## 2010-10-02 ENCOUNTER — Ambulatory Visit (HOSPITAL_COMMUNITY): Payer: 59

## 2010-10-04 ENCOUNTER — Ambulatory Visit (HOSPITAL_COMMUNITY): Payer: 59

## 2010-10-07 ENCOUNTER — Ambulatory Visit (HOSPITAL_COMMUNITY): Payer: 59

## 2010-10-09 ENCOUNTER — Ambulatory Visit (HOSPITAL_COMMUNITY): Payer: 59

## 2010-10-11 ENCOUNTER — Ambulatory Visit (HOSPITAL_COMMUNITY): Payer: 59 | Attending: Cardiovascular Disease

## 2010-10-11 DIAGNOSIS — Z9861 Coronary angioplasty status: Secondary | ICD-10-CM | POA: Insufficient documentation

## 2010-10-11 DIAGNOSIS — Z5189 Encounter for other specified aftercare: Secondary | ICD-10-CM | POA: Insufficient documentation

## 2010-10-11 DIAGNOSIS — I200 Unstable angina: Secondary | ICD-10-CM | POA: Insufficient documentation

## 2010-10-11 DIAGNOSIS — I2510 Atherosclerotic heart disease of native coronary artery without angina pectoris: Secondary | ICD-10-CM | POA: Insufficient documentation

## 2010-10-14 ENCOUNTER — Ambulatory Visit (HOSPITAL_COMMUNITY): Payer: 59

## 2010-10-16 ENCOUNTER — Ambulatory Visit (HOSPITAL_COMMUNITY): Payer: 59

## 2010-10-18 ENCOUNTER — Ambulatory Visit (HOSPITAL_COMMUNITY): Payer: 59

## 2010-10-21 ENCOUNTER — Ambulatory Visit (HOSPITAL_COMMUNITY): Payer: 59

## 2010-10-23 ENCOUNTER — Ambulatory Visit (HOSPITAL_COMMUNITY): Payer: 59

## 2010-10-25 ENCOUNTER — Ambulatory Visit (HOSPITAL_COMMUNITY): Payer: 59

## 2010-10-27 LAB — GLUCOSE, CAPILLARY: Glucose-Capillary: 142 mg/dL — ABNORMAL HIGH (ref 70–99)

## 2010-10-28 ENCOUNTER — Ambulatory Visit (HOSPITAL_COMMUNITY): Payer: 59

## 2010-10-30 ENCOUNTER — Ambulatory Visit (HOSPITAL_COMMUNITY): Payer: 59

## 2010-11-01 ENCOUNTER — Ambulatory Visit (HOSPITAL_COMMUNITY): Payer: 59

## 2010-11-04 ENCOUNTER — Ambulatory Visit (HOSPITAL_COMMUNITY): Payer: 59

## 2010-11-06 ENCOUNTER — Ambulatory Visit (HOSPITAL_COMMUNITY): Payer: 59

## 2010-11-08 ENCOUNTER — Ambulatory Visit (HOSPITAL_COMMUNITY): Payer: 59

## 2010-11-11 ENCOUNTER — Ambulatory Visit (HOSPITAL_COMMUNITY): Payer: 59 | Attending: Cardiovascular Disease

## 2010-11-11 DIAGNOSIS — Z9861 Coronary angioplasty status: Secondary | ICD-10-CM | POA: Insufficient documentation

## 2010-11-11 DIAGNOSIS — I200 Unstable angina: Secondary | ICD-10-CM | POA: Insufficient documentation

## 2010-11-11 DIAGNOSIS — I2510 Atherosclerotic heart disease of native coronary artery without angina pectoris: Secondary | ICD-10-CM | POA: Insufficient documentation

## 2010-11-11 DIAGNOSIS — Z5189 Encounter for other specified aftercare: Secondary | ICD-10-CM | POA: Insufficient documentation

## 2010-11-12 NOTE — Discharge Summary (Signed)
[name], [name]                 ACCOUNT NO.:  [id number]  MEDICAL RECORD NO.:  [id number]          PATIENT TYPE:  INP  LOCATION:  2925                         FACILITY:  [facility]  PHYSICIAN:  Nanetta Batty, M.D.   DATE OF BIRTH:  Feb 23, 1946  DATE OF ADMISSION:  08/28/2010 DATE OF DISCHARGE:  08/29/2010                              DISCHARGE SUMMARY   DISCHARGE DIAGNOSES: 1. Unstable angina. 2. Mild congestive heart failure secondary to ischemia, resolved. 3. Bronchitis, improving. 4. Diabetes mellitus type 2, stable. 5. Normal left ventricular function. 6. Allergy to STATINS, unable to give. 7. Peripheral vascular disease with history of percutaneous     transluminal angioplasty and stent.  DISCHARGE CONDITION:  Improved.  PROCEDURES: 1. Combined left heart cath on August 28, 2010 by Dr. Nanetta Batty. 2. PTCA and stent deployment to the OM1 with a bare-metal stent     reducing 99% stenosis to 0.  HISTORY OF PRESENT ILLNESS:  A 65 year old gentleman, who is normally followed by Dr. Nanetta Batty and primary care by Dr. Mirna Mires presented to [facility] Emergency Room with chest pain, dyspnea on exertion of several days' duration.  He is also complained of decreased stamina, nausea, and diaphoresis.  The symptoms were resolved with rest. He also had chest pain now with rest.  He would wake at night, and the night of admission, this chest pain awoken him from sleep.  He has had a negative cardiac functional study in the past.  He also had a heart cath 3 years ago that was stable.  Other history includes diabetes and peripheral vascular disease with stent to the left lower extremity in 2005.  He was admitted to rule out MI.  His initial chest x-ray showed vascular congestion and also some perihilar elevation.  On followup, chest x-ray only showed atelectasis.  The patient was diuresed with Bumex 2 mg IV twice a day for his possible infiltrate versus atelectasis.  He was  placed on Rocephin and Zithromax, but the patient does not have pneumonia and he had been treated for upper respiratory infection, we felt this is bronchitis, and he is being discharged with Zithromax.  DISCHARGE MEDICATIONS:  New are Zithromax, he will continue his Plavix, and increase his aspirin.  No statin secondary to allergy.  DISCHARGE INSTRUCTIONS:  No work for 2 weeks.  He will see Dr. Rennis Golden back in [place] for a one-time visit and then follow up with Dr. Allyson Sabal thereafter.  OTHER INSTRUCTIONS: 1. Increase activity slowly.  May shower.  No lifting for 1 week.  No     driving for 2 days.  No sexual activity for 2 days. 2. Low-sodium, heart-healthy diabetic diet.  Wash cath site with soap     and water.  Call us if any bleeding, swelling, or drainage. 3. See Dr. Rennis Golden on September 09, 2010 at 9:45 a.m.  Please note the patient was complaining of cramping in his hands and legs, most likely related to the diuretics, which have been discontinued.  LABORATORY DATA:  Hemoglobin at discharge 14.9, hematocrit 44.2, WBC 7.3, and platelets 230.  He did not  have an elevated white count the whole admission, neutrophils on admission were 42, and lymphs were 53. Pro time 13.6, INR of 1.02, PTT 29, and D-dimer was 0.22.  Accu-Cheks here has been stable, running from 113-170.  Chemistry:  Sodium 137, potassium 3.8, chloride 97, CO2 of 30, glucose 133, BUN 13, creatinine 1.46, calcium 9.7, magnesium 2.0.  Hemoglobin A1c 6.5.  Cardiac enzymes:  CKs were elevated 324, 327, 332.  MBs were all negative at 3.4, 3.5, and 3.0.  Troponin I's were all negative at 0.01 and less than 0.01.  Negative myocardial infarction.  BNP was low, less than 30.  Free T4 was 0.89.  TSH was 1.218.  MRSA was also negative.  Two-D echo revealed EF of 65%.  There were no regional wall motion abnormalities.  He had grade 1 diastolic dysfunction, trivial pericardial effusion.  RADIOLOGY:  Initial chest x-ray at  [facility]; pulmonary vascular congestion, most notable centrally.  Question of eventration right hemidiaphragm versus overlap structures, a right base mass.  They recommended two-view x-ray.  Portable chest x-ray was done the next day with mild bibasilar atelectasis versus less likely infiltrates and COPD. PA and lateral chest x-ray done on August 29, 2010; COPD, no acute cardiopulmonary disease, improved aeration in the lung bases compared to yesterday.  No edema, no effusion, no pneumonia.  No heart failure. There is a metal foreign body overlying the right chest, which is unchanged.  The patient is stable, ambulated.  Initially, sats ran a little low, but in the morning, ambulated and the sats remained elevated.  He is stable to return home now.  He will follow up as an outpatient as described. He will hold his metformin for 2 days, so it does not interfere with a cath dye.     Darcella Gasman. Annie Paras, N.P.   ______________________________ Nanetta Batty, M.D.    LRI/MEDQ  D:  08/29/2010  T:  08/30/2010  Job:  [id number]  cc:   Nanetta Batty, M.D. Annia Friendly. Loleta Chance, MD  Electronically Signed by Nada Boozer N.P. on 10/15/2010 05:40:45 PM Electronically Signed by Nanetta Batty M.D. on 10/23/2010 01:57:25 PM

## 2010-11-13 ENCOUNTER — Ambulatory Visit (HOSPITAL_COMMUNITY): Payer: 59

## 2010-11-15 ENCOUNTER — Ambulatory Visit (HOSPITAL_COMMUNITY): Payer: 59

## 2010-11-18 ENCOUNTER — Ambulatory Visit (HOSPITAL_COMMUNITY): Payer: 59

## 2010-11-20 ENCOUNTER — Ambulatory Visit (HOSPITAL_COMMUNITY): Payer: 59

## 2010-11-22 ENCOUNTER — Ambulatory Visit (HOSPITAL_COMMUNITY): Payer: 59

## 2010-11-25 ENCOUNTER — Ambulatory Visit (HOSPITAL_COMMUNITY): Payer: 59

## 2010-11-27 ENCOUNTER — Ambulatory Visit (HOSPITAL_COMMUNITY): Payer: 59

## 2010-11-29 ENCOUNTER — Ambulatory Visit (HOSPITAL_COMMUNITY): Payer: 59

## 2010-12-02 ENCOUNTER — Ambulatory Visit (HOSPITAL_COMMUNITY): Payer: 59

## 2010-12-04 ENCOUNTER — Ambulatory Visit (HOSPITAL_COMMUNITY): Payer: 59

## 2010-12-06 ENCOUNTER — Ambulatory Visit (HOSPITAL_COMMUNITY): Payer: 59

## 2010-12-09 ENCOUNTER — Ambulatory Visit (HOSPITAL_COMMUNITY): Payer: 59

## 2010-12-11 ENCOUNTER — Ambulatory Visit (HOSPITAL_COMMUNITY): Payer: 59 | Attending: Cardiovascular Disease

## 2010-12-11 DIAGNOSIS — Z9861 Coronary angioplasty status: Secondary | ICD-10-CM | POA: Insufficient documentation

## 2010-12-11 DIAGNOSIS — I200 Unstable angina: Secondary | ICD-10-CM | POA: Insufficient documentation

## 2010-12-11 DIAGNOSIS — I2510 Atherosclerotic heart disease of native coronary artery without angina pectoris: Secondary | ICD-10-CM | POA: Insufficient documentation

## 2010-12-11 DIAGNOSIS — Z5189 Encounter for other specified aftercare: Secondary | ICD-10-CM | POA: Insufficient documentation

## 2010-12-13 ENCOUNTER — Encounter (HOSPITAL_COMMUNITY): Payer: 59 | Attending: Cardiovascular Disease

## 2010-12-13 DIAGNOSIS — Z9861 Coronary angioplasty status: Secondary | ICD-10-CM | POA: Insufficient documentation

## 2010-12-13 DIAGNOSIS — Z5189 Encounter for other specified aftercare: Secondary | ICD-10-CM | POA: Insufficient documentation

## 2010-12-13 DIAGNOSIS — I200 Unstable angina: Secondary | ICD-10-CM | POA: Insufficient documentation

## 2010-12-13 DIAGNOSIS — I2510 Atherosclerotic heart disease of native coronary artery without angina pectoris: Secondary | ICD-10-CM | POA: Insufficient documentation

## 2010-12-16 ENCOUNTER — Ambulatory Visit (HOSPITAL_COMMUNITY): Payer: 59

## 2010-12-24 NOTE — Discharge Summary (Signed)
[name], [name]                 ACCOUNT NO.:  [id number]   MEDICAL RECORD NO.:  [id number]          PATIENT TYPE:  INP   LOCATION:  3736                         FACILITY:  [facility]   PHYSICIAN:  Nanetta Batty, M.D.   DATE OF BIRTH:  04-18-1946   DATE OF ADMISSION:  08/25/2007  DATE OF DISCHARGE:  08/26/2007                               DISCHARGE SUMMARY   DISCHARGE DIAGNOSES:  1. Chest pain with multiple cardiac risk factors, catheterization this      admission showing normal coronaries and normal left ventricular      function.  2. Non-insulin-dependent diabetes.  3. Dyslipidemia with a history of statin intolerance.  4. Peripheral vascular disease with previous left SFA stenting in      2006.  5. Gastroesophageal reflux.   HOSPITAL COURSE:  The patient is a 65 year old male followed by Dr.  Allyson Sabal and Dr. Juanetta Gosling with peripheral vascular disease.  He has had a  previous catheterization in July 2006 after an abnormal Myoview that  showed no significant coronary disease.  The patient presented August 25, 2007, with chest discomfort worrisome for unstable angina.  He was  admitted to telemetry.  He was seen by Dr. Lynnea Ferrier on admission.  He was  put on Lovenox and set up for diagnostic catheterization,  which was  done August 26, 2007.  This revealed no significant coronary disease  and preserved LV function with an EF of 50%.  Enzymes were negative.  His troponin was elevated at 636, but his MB was negative for an MI.   Labs:  White count 5.8, hemoglobin 12.3, hematocrit 35.7, platelets 195.  Sodium 137, potassium 3.7, BUN 12, creatinine 1.0.  LFTs were normal.  D-  dimer was less than 0.22.  Hemoglobin A1c is pending.  TSH is pending.  BNP was 30.  UA was unremarkable.  Pro time is 1.2.  EKG shows sinus  rhythm, sinus bradycardia, first-degree AV block.   DISCHARGE MEDICATIONS:  Glyburide/metformin 5/500; we will put this on  hold until August 29, 2007.  Ramipril 5 mg a  day, Plavix 75 mg a day,  fexofenadine 180 mg a day, and Protonix 40 mg a day.  We did put him  on low-dose beta blocker when he was admitted but since he does not have  coronary disease and he is bradycardic at baseline we will go ahead and  stop this at discharge.   He will follow up with Dr. Allyson Sabal and Dr. Juanetta Gosling as an outpatient.  He  was StarClosed.      Abelino Derrick, P.A.      Nanetta Batty, M.D.  Electronically Signed    LKK/MEDQ  D:  08/26/2007  T:  08/26/2007  Job:  [id number]   cc:   Nanetta Batty, M.D.  Edward L. Juanetta Gosling, M.D.

## 2010-12-24 NOTE — Cardiovascular Report (Signed)
NAMEJOZEPH, [name]                 ACCOUNT NO.:  [id number]   MEDICAL RECORD NO.:  [id number]          PATIENT TYPE:  INP   LOCATION:  3736                         FACILITY:  [facility]   PHYSICIAN:  Cristy Hilts. Jacinto Halim, MD       DATE OF BIRTH:  [birth year]   DATE OF PROCEDURE:  08/26/2007  DATE OF DISCHARGE:  08/26/2007                            CARDIAC CATHETERIZATION   PROCEDURES PERFORMED:  1. Left ventriculography.  2. Selective right and left coronary arteriography.  3. Right femoral arteriography and closure of the right femoral      arterial access with Star close.   INDICATIONS:  Mr. Viola Placeres is a 61-year gentleman with diabetes,  hyperlipidemia who has known peripheral arterial disease and had  undergone stenting to his left SFA in the past.  He was admitted to  [facility] with chest pain suggestive of unstable angina.  Given his multiple cardiovascular factors, he was brought to the  catheterization lab to evaluate his coronary anatomy.   HEMODYNAMIC DATA.:  Left ventricle pressure was 107/15, end diastolic  pressure of 24 mmHg.  Aortic pressure was 108/67 with mean of 84  milliseconds.  There was no pressure gradient across the aortic valve.   ANGIOGRAPHIC DATA:  Left ventricle:  Left ventricular systolic function  was in the lower limit of normal with mild global hypokinesis.  Ejection  fraction estimated around 50%.  There was no significant mitral  regurgitation.   Right coronary artery is a large dominant vessel.  Has mild luminal  irregularity, but otherwise no significant stenosis.  There was no  plaque rupture or haziness in the right coronary artery.   Left main coronary artery:  Left main coronary artery is a large vessel,  immediately bifurcates.   Circumflex:  Circumflex was a large-caliber vessel.  Gives origin to  large obtuse marginal and OM-2 and a small OM-3.  It has mild luminal  irregularity.   Left anterior descending.  Gives origin to a small  to moderate-sized  diagonal one, a small diagonal two and a very large diagonal three which  works like an optional LAD.  The LAD system has mild luminal  irregularity, but no significant stenosis.   IMPRESSION:  1. No significant coronary artery disease by cardiac catheterization.  2. Low normal LV systolic function, ejection fraction 50%.  Elevated      left ventricle and diastolic pressure.  I suspect this is related      to diabetes microvascular disease and obesity.   RECOMMENDATIONS:  Continue risk modification as indicated.  A total of  145 mL of contrast was utilized for diagnostic angiography.   TECHNIQUE OF THE PROCEDURE:  Under usual sterile precautions, using a 6-  French right femoral arterial access, 6-French multipurpose B2 catheter  was advanced to the ascending aorta and then into the left ventricle.  Left ventriculography was performed in LAO and RAO projection.  Catheter  pulled back to the ascending aorta, right coronary selectively engaged  and angiography was performed.  Then the left main coronary artery was  selective engaged  and angiography was performed.  Then the catheter was  pulled out of body and a 6-French angled pigtail catheter was introduced  into the left ventricle over a J-wire and therefore the above was  performed both in LAO and RAO projection.  The catheter was then pulled  out of body in the usual fashion.  Right femoral arteriography was  performed through the arterial access sheath and access closed with Star  close with excellent hemostasis.  The patient also suffered no immediate  complications.      Cristy Hilts. Jacinto Halim, MD  Electronically Signed     JRG/MEDQ  D:  08/26/2007  T:  08/27/2007  Job:  [id number]   cc:   Dani Gobble, MD  Gaspar Garbe, M.D.

## 2010-12-27 NOTE — Cardiovascular Report (Signed)
[name], LIZOTTE NO.:  [id number]   MEDICAL RECORD NO.:  [id number]          PATIENT TYPE:  OIB   LOCATION:  4706                         FACILITY:  [facility]   PHYSICIAN:  Nanetta Batty, M.D.   DATE OF BIRTH:  05/24/1946   DATE OF PROCEDURE:  02/27/2005  DATE OF DISCHARGE:                              CARDIAC CATHETERIZATION   Mr. Schexnayder is a 65 year old moderately overweight African American male with a  history of diabetes, borderline hypertension, hyperlipidemia, obstructive  sleep apnea.  He underwent cardiac catheterization on Dec 23, 2004, at  [facility] because of a mildly abnormal Cardiolite which was  entirely normal.  He was found to have a 70% mid left SFA lesion with  Dopplers that suggested this to be physiologically significant.  He does  complain of left lower extremity claudication.  He presents now for  angiography and percutaneous intervention.   DESCRIPTION OF PROCEDURE:  The patient was brought to the sixth floor [facility] in the postabsorptive state.  He  was premedicated with p.o. Valium.  His right groin was prepped and shaved  in the usual sterile fashion.  1% Xylocaine was used for local anesthesia.  A 5 French sheath was inserted into the right femoral artery using standard  Seldinger technique.  A 5 [place] tennis racquet catheter, cross over  catheter, were used for mid stream and distal abdominal aortography and bi-  femoral run off.  Visipaque dye was used for the entirety of the case.  Retrograde aortic pressures were monitored during the case.   ANGIOGRAPHIC RESULTS  1.  Abdominal aorta normal.  2.  Left lower extremity:  70% segmental mid left SFA with three vessel run      off.  3.  Right lower extremity:  Normal.   The patient received 3500 units heparin intravenously.  Contralateral access  was obtained with a short IMA catheter, angled glide-wire, followed by a  Wholey  wire, and a 7 French Terumo cross over sheath.  Visipaque dye was  used for the entirety of the intervention.  The Wholey was advanced down the  SFA across the lesion.  Predilatation was performed with a 4 by 4 Powerflex  at minimal pressures.  Stenting with a 7 by 4 Smart and post dilatation with  a 6 by 4 Powerflex at 4 atmospheres.  Resulting reduction of 70% segmental  mid left SFA stenosis to 0% residual.  The patient tolerated the procedure  well.  The guide-wire was removed.  ACT was measured at less than 200.  The  sheath  was removed and pressure was held on the groin to achieve hemostasis.  The  patient left the lab in stable condition.  He will be hydrated overnight and  discharged home in the morning.  He will have follow up Dopplers and ABIs  after which we will see him back in the office for follow up.  He left the  lab in stable condition.       JB/MEDQ  D:  02/27/2005  T:  02/27/2005  Job:  [id number]   cc:   Tesfaye D. Felecia Shelling, MD  [address]  [place]  [place] [address]  Fax: [phone]   Dani Gobble, MD  Fax: [phone]

## 2010-12-27 NOTE — Op Note (Signed)
[facility]  Patient:    [name], [name] Visit Number: [id number] MRN: [id number]          Service Type: END Location: DAY Attending Physician:  Jonathon Bellows Dictated by:   Roetta Sessions, M.D. Proc. Date: 07/26/01 Admit Date:  07/26/2001                             Operative Report  PROCEDURE:  Screening colonoscopy.  ENDOSCOPIST:  Roetta Sessions, M.D.  INDICATION FOR PROCEDURE:  Patient is a 65 year old gentleman with no bowel symptoms, referred by Dr. Kari Baars for colorectal cancer screening.  He has no family history of colorectal neoplasia and has never had his colon imaged.  Colonoscopy is now being done as a standard screening maneuver.  This approach has been discussed with [name] at length at the bedside.  Potential risks, benefits and alternatives have been reviewed and questions answered.  I feel he is low risk for conscious sedation with Versed and Demerol.  DESCRIPTION OF PROCEDURE:  Patient was placed in the left lateral decubitus position.  O2 saturation, blood pressure, pulse and respirations were monitored throughout the entire procedure.  Conscious sedation:  Versed 3 mg IV, Demerol 75 mg IV in divided doses.  INSTRUMENT:  Olympus therapeutic colonoscope.  FINDINGS:  Digital rectal examination revealed no abnormalities.  ENDOSCOPIC FINDINGS:  Prep was good.  Rectum:  Examination of the rectal mucosa including a retroflexed view of the anal verge revealed no abnormalities.  Colon:  Colonic mucosa was surveyed from the rectosigmoid junction through the left, transverse and right colon to the area of the appendiceal orifice, ileocecal valve and cecum.  No colonic mucosal abnormalities were noted upon upon advancing the scope to the cecum.  From the level of the cecum and ileocecal valve (see photos), the scope was slowly withdrawn and all previously mentioned mucosal surfaces were again seen and again no abnormalities  were observed.  Patient tolerated the procedure well and was reactive at endoscopy.  IMPRESSION: 1. Normal rectum. 2. Normal colon.  RECOMMENDATIONS: 1. Follow up with Dr. Kari Baars. 2. Repeat colonoscopy in 10 years. Dictated by:   Roetta Sessions, M.D. Attending Physician:  Jonathon Bellows DD:  07/26/01 TD:  07/26/01 Job: [id number]

## 2011-02-07 ENCOUNTER — Emergency Department (HOSPITAL_COMMUNITY)
Admission: EM | Admit: 2011-02-07 | Discharge: 2011-02-07 | Disposition: A | Discharge status: Nursing Home (Includes ICF) | Payer: 59 | Source: Home / Self Care | Attending: Emergency Medicine | Admitting: Emergency Medicine

## 2011-02-07 ENCOUNTER — Emergency Department (HOSPITAL_COMMUNITY): Payer: 59

## 2011-02-07 ENCOUNTER — Inpatient Hospital Stay (HOSPITAL_COMMUNITY)
Admission: AD | Admit: 2011-02-07 | Discharge: 2011-02-11 | DRG: 251 | Disposition: A | Discharge status: Home / Self Care | Payer: 59 | Source: Other Acute Inpatient Hospital | Attending: Cardiovascular Disease | Admitting: Cardiovascular Disease

## 2011-02-07 DIAGNOSIS — R0609 Other forms of dyspnea: Secondary | ICD-10-CM | POA: Insufficient documentation

## 2011-02-07 DIAGNOSIS — I509 Heart failure, unspecified: Secondary | ICD-10-CM | POA: Insufficient documentation

## 2011-02-07 DIAGNOSIS — J4489 Other specified chronic obstructive pulmonary disease: Secondary | ICD-10-CM | POA: Diagnosis present

## 2011-02-07 DIAGNOSIS — R079 Chest pain, unspecified: Secondary | ICD-10-CM | POA: Insufficient documentation

## 2011-02-07 DIAGNOSIS — I2510 Atherosclerotic heart disease of native coronary artery without angina pectoris: Secondary | ICD-10-CM | POA: Diagnosis present

## 2011-02-07 DIAGNOSIS — E785 Hyperlipidemia, unspecified: Secondary | ICD-10-CM | POA: Diagnosis present

## 2011-02-07 DIAGNOSIS — Y831 Surgical operation with implant of artificial internal device as the cause of abnormal reaction of the patient, or of later complication, without mention of misadventure at the time of the procedure: Secondary | ICD-10-CM | POA: Diagnosis present

## 2011-02-07 DIAGNOSIS — E119 Type 2 diabetes mellitus without complications: Secondary | ICD-10-CM | POA: Diagnosis present

## 2011-02-07 DIAGNOSIS — I200 Unstable angina: Secondary | ICD-10-CM | POA: Diagnosis present

## 2011-02-07 DIAGNOSIS — T82897A Other specified complication of cardiac prosthetic devices, implants and grafts, initial encounter: Principal | ICD-10-CM | POA: Diagnosis present

## 2011-02-07 DIAGNOSIS — Z79899 Other long term (current) drug therapy: Secondary | ICD-10-CM | POA: Insufficient documentation

## 2011-02-07 DIAGNOSIS — Z7982 Long term (current) use of aspirin: Secondary | ICD-10-CM

## 2011-02-07 DIAGNOSIS — R0989 Other specified symptoms and signs involving the circulatory and respiratory systems: Secondary | ICD-10-CM | POA: Diagnosis present

## 2011-02-07 DIAGNOSIS — J449 Chronic obstructive pulmonary disease, unspecified: Secondary | ICD-10-CM | POA: Diagnosis present

## 2011-02-07 DIAGNOSIS — I739 Peripheral vascular disease, unspecified: Secondary | ICD-10-CM | POA: Diagnosis present

## 2011-02-07 DIAGNOSIS — I10 Essential (primary) hypertension: Secondary | ICD-10-CM | POA: Insufficient documentation

## 2011-02-07 DIAGNOSIS — K219 Gastro-esophageal reflux disease without esophagitis: Secondary | ICD-10-CM | POA: Insufficient documentation

## 2011-02-07 LAB — TROPONIN I: Troponin I: <0.3 ng/mL (ref <0.30)

## 2011-02-07 LAB — BASIC METABOLIC PANEL
CO2: 27 mEq/L (ref 19–32)
Calcium: 9.9 mg/dL (ref 8.4–10.5)
Potassium: 3.3 mEq/L — ABNORMAL LOW (ref 3.5–5.1)
Sodium: 140 mEq/L (ref 135–145)

## 2011-02-07 LAB — GLUCOSE, CAPILLARY
Glucose-Capillary: 103 mg/dL — ABNORMAL HIGH (ref 70–99)
Glucose-Capillary: 91 mg/dL (ref 70–99)
Glucose-Capillary: 136 mg/dL — ABNORMAL HIGH (ref 70–99)
Glucose-Capillary: 75 mg/dL (ref 70–99)

## 2011-02-07 LAB — CARDIAC PANEL(CRET KIN+CKTOT+MB+TROPI)
CK, MB: 4.7 ng/mL — ABNORMAL HIGH (ref 0.3–4.0)
CK, MB: 3.9 ng/mL (ref 0.3–4.0)
Relative Index: 1.5 (ref 0.0–2.5)
Relative Index: 1.4 (ref 0.0–2.5)
Total CK: 322 U/L — ABNORMAL HIGH (ref 7–232)
Total CK: 283 U/L — ABNORMAL HIGH (ref 7–232)
Troponin I: <0.3 ng/mL (ref <0.30)
Troponin I: <0.3 ng/mL (ref <0.30)

## 2011-02-07 LAB — COMPREHENSIVE METABOLIC PANEL
ALT: 16 U/L (ref 0–53)
AST: 19 U/L (ref 0–37)
Albumin: 3.3 g/dL — ABNORMAL LOW (ref 3.5–5.2)
CO2: 29 mEq/L (ref 19–32)
Calcium: 9.2 mg/dL (ref 8.4–10.5)
GFR calc non Af Amer: >60 mL/min (ref >60)
Sodium: 140 mEq/L (ref 135–145)

## 2011-02-07 LAB — CBC
MCH: 32.6 pg (ref 26.0–34.0)
MCH: 33 pg (ref 26.0–34.0)
Platelets: 168 10*3/uL (ref 150–400)
Platelets: 176 10*3/uL (ref 150–400)
RBC: 4.03 MIL/uL — ABNORMAL LOW (ref 4.22–5.81)
RBC: 4.23 MIL/uL (ref 4.22–5.81)
WBC: 5.4 10*3/uL (ref 4.0–10.5)
WBC: 6.1 10*3/uL (ref 4.0–10.5)

## 2011-02-07 LAB — CK TOTAL AND CKMB (NOT AT ARMC)
CK, MB: 7.8 ng/mL (ref 0.3–4.0)
Relative Index: 1.6 (ref 0.0–2.5)
Total CK: 474 U/L — ABNORMAL HIGH (ref 7–232)

## 2011-02-07 LAB — PROTIME-INR
INR: 1.1 (ref 0.00–1.49)
Prothrombin Time: 14.4 seconds (ref 11.6–15.2)

## 2011-02-07 LAB — HEPARIN LEVEL (UNFRACTIONATED): Heparin Unfractionated: 0.24 IU/mL — ABNORMAL LOW (ref 0.30–0.70)

## 2011-02-08 ENCOUNTER — Inpatient Hospital Stay (HOSPITAL_COMMUNITY): Payer: 59

## 2011-02-08 LAB — LIPID PANEL
LDL Cholesterol: 96 mg/dL (ref 0–99)
Total CHOL/HDL Ratio: 4.1 RATIO
VLDL: 26 mg/dL (ref 0–40)

## 2011-02-08 LAB — CBC
MCHC: 34.6 g/dL (ref 30.0–36.0)
Platelets: 184 10*3/uL (ref 150–400)
RDW: 12.5 % (ref 11.5–15.5)

## 2011-02-08 LAB — GLUCOSE, CAPILLARY
Glucose-Capillary: 116 mg/dL — ABNORMAL HIGH (ref 70–99)
Glucose-Capillary: 158 mg/dL — ABNORMAL HIGH (ref 70–99)
Glucose-Capillary: 92 mg/dL (ref 70–99)

## 2011-02-08 LAB — CARDIAC PANEL(CRET KIN+CKTOT+MB+TROPI)
CK, MB: 3.7 ng/mL (ref 0.3–4.0)
Total CK: 257 U/L — ABNORMAL HIGH (ref 7–232)

## 2011-02-08 MED ORDER — TECHNETIUM TC 99M TETROFOSMIN IV KIT
30.0000 | PACK | Freq: Once | INTRAVENOUS | Status: AC | PRN
  Administered 2011-02-08: 30 via INTRAVENOUS

## 2011-02-08 MED ORDER — TECHNETIUM TC 99M TETROFOSMIN IV KIT
10.0000 | PACK | Freq: Once | INTRAVENOUS | Status: AC | PRN
  Administered 2011-02-08: 10 via INTRAVENOUS

## 2011-02-09 ENCOUNTER — Other Ambulatory Visit (HOSPITAL_COMMUNITY): Payer: 59

## 2011-02-09 LAB — CBC
MCV: 93.3 fL (ref 78.0–100.0)
Platelets: 187 10*3/uL (ref 150–400)
RBC: 4.3 MIL/uL (ref 4.22–5.81)
RDW: 12.7 % (ref 11.5–15.5)
WBC: 7.1 10*3/uL (ref 4.0–10.5)

## 2011-02-09 LAB — HEPARIN LEVEL (UNFRACTIONATED): Heparin Unfractionated: 0.38 IU/mL (ref 0.30–0.70)

## 2011-02-09 LAB — GLUCOSE, CAPILLARY
Glucose-Capillary: 140 mg/dL — ABNORMAL HIGH (ref 70–99)
Glucose-Capillary: 62 mg/dL — ABNORMAL LOW (ref 70–99)
Glucose-Capillary: 101 mg/dL — ABNORMAL HIGH (ref 70–99)

## 2011-02-10 HISTORY — PX: LEFT HEART CATH AND CORONARY ANGIOGRAPHY: CATH118249

## 2011-02-10 HISTORY — PX: CORONARY BALLOON ANGIOPLASTY: CATH118233

## 2011-02-10 HISTORY — PX: CARDIAC CATHETERIZATION: SHX172

## 2011-02-10 LAB — CBC
HCT: 40.4 % (ref 39.0–52.0)
Hemoglobin: 13.8 g/dL (ref 13.0–17.0)
MCH: 31.8 pg (ref 26.0–34.0)
MCV: 93.1 fL (ref 78.0–100.0)
Platelets: 187 10*3/uL (ref 150–400)
RBC: 4.34 MIL/uL (ref 4.22–5.81)
WBC: 5.9 10*3/uL (ref 4.0–10.5)

## 2011-02-10 LAB — BASIC METABOLIC PANEL
CO2: 29 mEq/L (ref 19–32)
Calcium: 9.9 mg/dL (ref 8.4–10.5)
Chloride: 105 mEq/L (ref 96–112)
Creatinine, Ser: 1.08 mg/dL (ref 0.50–1.35)
Glucose, Bld: 112 mg/dL — ABNORMAL HIGH (ref 70–99)

## 2011-02-10 LAB — GLUCOSE, CAPILLARY
Glucose-Capillary: 123 mg/dL — ABNORMAL HIGH (ref 70–99)
Glucose-Capillary: 118 mg/dL — ABNORMAL HIGH (ref 70–99)

## 2011-02-10 LAB — HEPARIN LEVEL (UNFRACTIONATED): Heparin Unfractionated: 0.61 IU/mL (ref 0.30–0.70)

## 2011-02-11 LAB — CBC
HCT: 38.2 % — ABNORMAL LOW (ref 39.0–52.0)
Platelets: 182 10*3/uL (ref 150–400)
RBC: 4.09 MIL/uL — ABNORMAL LOW (ref 4.22–5.81)
RDW: 12.7 % (ref 11.5–15.5)
WBC: 6.5 10*3/uL (ref 4.0–10.5)

## 2011-02-11 LAB — GLUCOSE, CAPILLARY

## 2011-02-11 LAB — BASIC METABOLIC PANEL
Chloride: 105 mEq/L (ref 96–112)
GFR calc Af Amer: >60 mL/min (ref >60)
GFR calc non Af Amer: >60 mL/min (ref >60)
Glucose, Bld: 136 mg/dL — ABNORMAL HIGH (ref 70–99)
Potassium: 4.3 mEq/L (ref 3.5–5.1)
Sodium: 138 mEq/L (ref 135–145)

## 2011-02-25 NOTE — Cardiovascular Report (Signed)
[name], [name] NO.:  [id number]  MEDICAL RECORD NO.:  [id number]  LOCATION:  6525                         FACILITY:  [facility]  PHYSICIAN:  Nicki Guadalajara, M.D.     DATE OF BIRTH:  [birth year]  DATE OF PROCEDURE:  02/10/2011 DATE OF DISCHARGE:                           CARDIAC CATHETERIZATION   INDICATIONS:  [name] is a 65 year old African American gentleman who has known coronary as well as peripheral vascular disease, status post remote left SFA PTA and stenting in July 2006.  In January 2012, he had placement of a 2.0 x 15-mm MiniVision stent in his circumflex marginal vessel.  The patient has a history of diabetes mellitus, hyperlipidemia, and COPD.  He was recently admitted to [facility] with chest pain suggestive of unstable angina.  He now presents for cardiac catheterization.  PROCEDURE:  After premedication with Versed 2 mg plus fentanyl 25 mcg, the patient was prepped and draped in usual fashion.  His right femoral artery was punctured anteriorly and a 5-French sheath was inserted without difficulty.  Diagnostic catheterization was done utilizing 5- [place] Judkins for left and right coronary catheters.  A 5-French pigtail catheter was used for left ventriculography.  With a demonstration of 95% in-stent restenosis in the circumflex marginal stent, the decision was made to perform intervention.  Arterial sheath was exchanged for 6-French sheath.  Bivalirudin was administered in bolus plus infusion.  The patient had been on chronic Plavix, but received an additional 150 mg oral dose in the laboratory.  He did receive IC nitroglycerin down the coronary artery.  A 6-French Voda 3.5 guide was used for the guiding catheter.  A Prowater wire was used and was able to be passed down into the circumflex marginal vessel.  Initial attempts with insertion of a 2.0 x 10-mm cutting balloon were made and there was unsuccessful to cross the  subtotal in-stent restenotic lesion. The cutting balloon was then removed.  A 1.5 x 12-mm Mini-Trek balloon was then inserted and multiple dilatations were made at the ostium and within the stented segment of this marginal vessel.  The Mini-Trek balloon was then removed and the 2.0 x 10-mm cutting balloon was then reinserted and at this time, I was able to cross into the stenotic region.  Multiple dilatations were made within the stented segment up to a maximum of 9 atmospheres.  In addition at the ostium and just beyond the stented segment, low-level dilatation was made at 3 and 4 atmospheres.  Scout angiography confirmed excellent angiographic result. There was brisk TIMI 3 flow.  There was no evidence for dissection.  HEMODYNAMIC DATA:  Central aortic pressure was 122/62.  Left ventricular pressure was 122/10.  ANGIOGRAPHIC DATA:  Left main coronary artery was angiographically normal and bifurcated into an LAD and left circumflex system.  The LAD was angiographically normal and gave rise to two major diagonal vessels and several septal perforating arteries.  Circumflex vessel was large-caliber AV groove circumflex system.  There was 95% in-stent restenosis in the first marginal branch.  There was mild 10-20% narrowing in the AV groove circumflex and left atrial circumflex branch.  The right coronary artery  was angiographically normal.  Biplane cine left ventriculography revealed preserved global LV function.  On the LAO projection, there was focal mid posterolateral hypocontractility.  Following coronary intervention to the circumflex marginal vessel with PTCA, and cutting balloon arthrotomy, the 95% in-stent restenosis was reduced to 0%.  There was brisk TIMI 3 flow.  There was no evidence for dissection.          ______________________________ Nicki Guadalajara, M.D.     TK/MEDQ  D:  02/10/2011  T:  02/11/2011  Job:  [id number]  cc:   Nanetta Batty, M.D. Annia Friendly. Loleta Chance,  MD  Electronically Signed by Nicki Guadalajara M.D. on 02/25/2011 03:30:52 PM

## 2011-03-05 NOTE — Discharge Summary (Signed)
  [name], [name] NO.:  [id number]  MEDICAL RECORD NO.:  [id number]  LOCATION:  6525                         FACILITY:  [facility]  PHYSICIAN:  [name], M.D.   DATE OF BIRTH:  1946/07/28  DATE OF ADMISSION:  02/07/2011 DATE OF DISCHARGE:  02/11/2011                              DISCHARGE SUMMARY   DISCHARGE DIAGNOSES: 1. Unstable angina, abnormal Myoview this admission, followed by     catheterization and subsequent intervention with cutting balloon     to an in-stent restenosis to an obtuse marginal 1 stent. 2. Known coronary disease with obtuse marginal 1 bare-metal stenting     January 2012 with documented patency of the site on early relook     January 2012. 3. Good left ventricular function. 4. Type 2 non-insulin diabetes. 5. Vascular disease with history of left superficial femoral artery     stenting 2006. 6. Treated hypertension. 7. History of dyslipidemia with statin intolerance. HOSPITAL COURSE:  [name] is a pleasant 65 year old male followed by Dr. Allyson Sabal and Dr. Loleta Chance with coronary disease and vascular disease.  He presented on February 07, 2011 with chest pain worrisome for unstable angina.  He was admitted to telemetry, started on heparin and nitrates. His enzymes were negative.  He was set up for a Myoview which was positive suggesting lateral ischemia.  It was decided to proceed with diagnostic catheterization.  This was done February 10, 2011.  This revealed a normal RCA, normal left main, normal LAD with a large diagonal and normal circumflex with 95% in-stent restenosis in the OM1 branch.  This was treated with a cutting balloon by Dr. Tresa Endo.  The patient tolerated procedure well and we feel he can be discharged February 11, 2011.  He will follow up with Dr. Allyson Sabal as an outpatient.  He has been instructed not to take his metformin for 48 hours.  Please see med rec for complete discharge medications.  LABS AT DISCHARGE:  White count 6.5,  hemoglobin 13.2, hematocrit 38.2, platelets 182,000.  Sodium 138, potassium 4.3, BUN 13, creatinine 1.12.  OTHER LABS:  Liver functions were normal.  INR was 1.10.  CKs were elevated at 474 but the MB fraction, relative index was 1.6, and troponins were negative.  Hemoglobin A1c was 6.2.  Cholesterol is 162, HDL 40, LDL 96.  Chest x-ray showed no evidence of acute disease.  DISPOSITION:  The patient is discharged in stable condition.  As an outpatient we may want to try and consider Crestor 5 mg twice a week, he can discuss this with Dr. Allyson Sabal.     [name], P.A.   ______________________________ [name], M.D.    [name]  D:  02/11/2011  T:  02/11/2011  Job:  [id number]  cc:   [name], M.D.  Electronically Signed by [name] P.A. on 02/13/2011 12:00:06 PM Electronically Signed by [name] M.D. on 03/05/2011 03:17:56 PM

## 2011-05-01 LAB — BASIC METABOLIC PANEL
BUN: 15
BUN: 11
CO2: 29
CO2: 28
Chloride: 104
Chloride: 106
Creatinine, Ser: 1.12
GFR calc non Af Amer: >60
Glucose, Bld: 102 — ABNORMAL HIGH
Potassium: 3.5
Potassium: 3.9

## 2011-05-01 LAB — COMPREHENSIVE METABOLIC PANEL
ALT: 19
Albumin: 3.3 — ABNORMAL LOW
Alkaline Phosphatase: 32 — ABNORMAL LOW
BUN: 12
Calcium: 8.7
Potassium: 3.7
Sodium: 137
Total Protein: 5.9 — ABNORMAL LOW

## 2011-05-01 LAB — DIFFERENTIAL
Basophils Absolute: 0
Basophils Relative: 1
Eosinophils Absolute: 0
Eosinophils Relative: 1
Monocytes Absolute: 0.4

## 2011-05-01 LAB — PROTIME-INR
INR: 1.1
Prothrombin Time: 15.2
Prothrombin Time: 14.1

## 2011-05-01 LAB — CBC
HCT: 36.7 — ABNORMAL LOW
HCT: 35.7 — ABNORMAL LOW
Hemoglobin: 12.7 — ABNORMAL LOW
MCHC: 34.5
MCHC: 34.4
MCV: 92.3
MCV: 93.7
Platelets: 221
Platelets: 195
RDW: 13.2
WBC: 4.8

## 2011-05-01 LAB — POCT CARDIAC MARKERS: Operator id: 267321

## 2011-05-01 LAB — APTT: aPTT: 39 — ABNORMAL HIGH

## 2011-05-01 LAB — TROPONIN I: Troponin I: 0.01

## 2011-05-01 LAB — URINALYSIS, ROUTINE W REFLEX MICROSCOPIC
Bilirubin Urine: NEGATIVE
Hgb urine dipstick: NEGATIVE
Nitrite: NEGATIVE
Specific Gravity, Urine: 1.018
Urobilinogen, UA: 0.2
pH: 7

## 2011-05-01 LAB — CK TOTAL AND CKMB (NOT AT ARMC)
Total CK: 636 — ABNORMAL HIGH
Total CK: 577 — ABNORMAL HIGH

## 2011-05-01 LAB — TSH: TSH: 0.993

## 2011-05-01 LAB — HEMOGLOBIN A1C: Hgb A1c MFr Bld: 6.4 — ABNORMAL HIGH

## 2011-07-16 ENCOUNTER — Inpatient Hospital Stay (HOSPITAL_COMMUNITY)
Admission: EM | Admit: 2011-07-16 | Discharge: 2011-07-18 | DRG: 247 | Disposition: A | Discharge status: Home / Self Care | Payer: 59 | Attending: Cardiovascular Disease | Admitting: Cardiovascular Disease

## 2011-07-16 ENCOUNTER — Other Ambulatory Visit: Payer: Self-pay

## 2011-07-16 ENCOUNTER — Emergency Department (HOSPITAL_COMMUNITY): Payer: 59

## 2011-07-16 DIAGNOSIS — Z9861 Coronary angioplasty status: Secondary | ICD-10-CM | POA: Diagnosis present

## 2011-07-16 DIAGNOSIS — Y831 Surgical operation with implant of artificial internal device as the cause of abnormal reaction of the patient, or of later complication, without mention of misadventure at the time of the procedure: Secondary | ICD-10-CM | POA: Diagnosis present

## 2011-07-16 DIAGNOSIS — Z7982 Long term (current) use of aspirin: Secondary | ICD-10-CM

## 2011-07-16 DIAGNOSIS — Z955 Presence of coronary angioplasty implant and graft: Secondary | ICD-10-CM

## 2011-07-16 DIAGNOSIS — I509 Heart failure, unspecified: Secondary | ICD-10-CM | POA: Diagnosis present

## 2011-07-16 DIAGNOSIS — I200 Unstable angina: Secondary | ICD-10-CM | POA: Diagnosis present

## 2011-07-16 DIAGNOSIS — I2510 Atherosclerotic heart disease of native coronary artery without angina pectoris: Secondary | ICD-10-CM | POA: Diagnosis present

## 2011-07-16 DIAGNOSIS — E785 Hyperlipidemia, unspecified: Secondary | ICD-10-CM | POA: Diagnosis present

## 2011-07-16 DIAGNOSIS — E1151 Type 2 diabetes mellitus with diabetic peripheral angiopathy without gangrene: Secondary | ICD-10-CM | POA: Diagnosis present

## 2011-07-16 DIAGNOSIS — E119 Type 2 diabetes mellitus without complications: Secondary | ICD-10-CM | POA: Diagnosis present

## 2011-07-16 DIAGNOSIS — E118 Type 2 diabetes mellitus with unspecified complications: Secondary | ICD-10-CM | POA: Diagnosis present

## 2011-07-16 DIAGNOSIS — T82897A Other specified complication of cardiac prosthetic devices, implants and grafts, initial encounter: Principal | ICD-10-CM | POA: Diagnosis present

## 2011-07-16 DIAGNOSIS — I10 Essential (primary) hypertension: Secondary | ICD-10-CM | POA: Diagnosis present

## 2011-07-16 HISTORY — DX: Atherosclerotic heart disease of native coronary artery without angina pectoris: I25.10

## 2011-07-16 HISTORY — DX: Essential (primary) hypertension: I10

## 2011-07-16 HISTORY — DX: Heart failure, unspecified: I50.9

## 2011-07-16 LAB — COMPREHENSIVE METABOLIC PANEL
AST: 18 U/L (ref 0–37)
BUN: 10 mg/dL (ref 6–23)
CO2: 29 mEq/L (ref 19–32)
Chloride: 105 mEq/L (ref 96–112)
Creatinine, Ser: 0.99 mg/dL (ref 0.50–1.35)
GFR calc Af Amer: >90 mL/min (ref >90)
GFR calc non Af Amer: 85 mL/min — ABNORMAL LOW (ref >90)
Glucose, Bld: 100 mg/dL — ABNORMAL HIGH (ref 70–99)
Total Bilirubin: 0.8 mg/dL (ref 0.3–1.2)

## 2011-07-16 LAB — BASIC METABOLIC PANEL
CO2: 28 mEq/L (ref 19–32)
Glucose, Bld: 102 mg/dL — ABNORMAL HIGH (ref 70–99)
Potassium: 4.1 mEq/L (ref 3.5–5.1)
Sodium: 139 mEq/L (ref 135–145)

## 2011-07-16 LAB — CBC
HCT: 42.2 % (ref 39.0–52.0)
HCT: 40.9 % (ref 39.0–52.0)
Hemoglobin: 14.5 g/dL (ref 13.0–17.0)
MCV: 94.7 fL (ref 78.0–100.0)
RBC: 4.45 MIL/uL (ref 4.22–5.81)
RBC: 4.32 MIL/uL (ref 4.22–5.81)
WBC: 6.7 10*3/uL (ref 4.0–10.5)

## 2011-07-16 LAB — POCT I-STAT TROPONIN I

## 2011-07-16 LAB — HEMOGLOBIN A1C
Hgb A1c MFr Bld: 6 % — ABNORMAL HIGH (ref <5.7)
Mean Plasma Glucose: 126 mg/dL — ABNORMAL HIGH (ref <117)

## 2011-07-16 LAB — CARDIAC PANEL(CRET KIN+CKTOT+MB+TROPI): Relative Index: 1.7 (ref 0.0–2.5)

## 2011-07-16 LAB — GLUCOSE, CAPILLARY

## 2011-07-16 MED ORDER — RAMIPRIL 5 MG PO CAPS
5.0000 mg | ORAL_CAPSULE | Freq: Every day | ORAL | Status: DC
  Administered 2011-07-17 – 2011-07-18 (×2): 5 mg via ORAL
  Filled 2011-07-16 (×3): qty 1

## 2011-07-16 MED ORDER — NITROGLYCERIN 0.4 MG SL SUBL
0.4000 mg | SUBLINGUAL_TABLET | SUBLINGUAL | Status: DC | PRN
  Administered 2011-07-16 (×2): 0.4 mg via SUBLINGUAL

## 2011-07-16 MED ORDER — ONDANSETRON HCL 4 MG/2ML IJ SOLN: 4.0000 mg | Freq: Four times a day (QID) | INTRAMUSCULAR | Status: DC | PRN

## 2011-07-16 MED ORDER — ACETAMINOPHEN 325 MG PO TABS: 650.0000 mg | ORAL_TABLET | ORAL | Status: DC | PRN

## 2011-07-16 MED ORDER — CLOPIDOGREL BISULFATE 75 MG PO TABS
75.0000 mg | ORAL_TABLET | ORAL | Status: AC
  Administered 2011-07-16: 75 mg via ORAL

## 2011-07-16 MED ORDER — CLOPIDOGREL BISULFATE 75 MG PO TABS
75.0000 mg | ORAL_TABLET | Freq: Every day | ORAL | Status: DC
  Filled 2011-07-16 (×2): qty 1

## 2011-07-16 MED ORDER — PITAVASTATIN CALCIUM 2 MG PO TABS: 1.0000 | ORAL_TABLET | ORAL | Status: DC

## 2011-07-16 MED ORDER — NITROGLYCERIN 0.4 MG SL SUBL
SUBLINGUAL_TABLET | SUBLINGUAL | Status: AC
  Filled 2011-07-16: qty 25

## 2011-07-16 MED ORDER — METFORMIN HCL 500 MG PO TABS
500.0000 mg | ORAL_TABLET | Freq: Every day | ORAL | Status: DC
  Filled 2011-07-16 (×2): qty 1

## 2011-07-16 MED ORDER — ASPIRIN 81 MG PO CHEW
CHEWABLE_TABLET | ORAL | Status: AC
  Administered 2011-07-16: 324 mg
  Filled 2011-07-16: qty 4

## 2011-07-16 MED ORDER — ASPIRIN EC 325 MG PO TBEC: 325.0000 mg | DELAYED_RELEASE_TABLET | Freq: Every day | ORAL | Status: DC

## 2011-07-16 MED ORDER — PANTOPRAZOLE SODIUM 40 MG PO TBEC
40.0000 mg | DELAYED_RELEASE_TABLET | Freq: Every day | ORAL | Status: DC
  Filled 2011-07-16: qty 1

## 2011-07-16 MED ORDER — ASPIRIN 325 MG PO TABS: 325.0000 mg | ORAL_TABLET | Freq: Every day | ORAL | Status: DC

## 2011-07-16 MED ORDER — METOPROLOL SUCCINATE ER 25 MG PO TB24
25.0000 mg | ORAL_TABLET | Freq: Every day | ORAL | Status: DC
  Administered 2011-07-17 – 2011-07-18 (×2): 25 mg via ORAL
  Filled 2011-07-16 (×3): qty 1

## 2011-07-16 MED ORDER — METOPROLOL SUCCINATE ER 25 MG PO TB24
25.0000 mg | ORAL_TABLET | ORAL | Status: AC
  Administered 2011-07-16: 25 mg via ORAL
  Filled 2011-07-16 (×2): qty 1

## 2011-07-16 MED ORDER — GLYBURIDE-METFORMIN 5-500 MG PO TABS: 1.0000 | ORAL_TABLET | Freq: Every day | ORAL | Status: DC

## 2011-07-16 MED ORDER — GLYBURIDE 5 MG PO TABS
5.0000 mg | ORAL_TABLET | Freq: Every day | ORAL | Status: DC
  Filled 2011-07-16 (×2): qty 1

## 2011-07-16 MED ORDER — ASPIRIN 81 MG PO CHEW
324.0000 mg | CHEWABLE_TABLET | ORAL | Status: DC
  Filled 2011-07-16: qty 1

## 2011-07-16 MED ORDER — PANTOPRAZOLE SODIUM 40 MG PO TBEC
40.0000 mg | DELAYED_RELEASE_TABLET | ORAL | Status: AC
  Administered 2011-07-16: 40 mg via ORAL
  Filled 2011-07-16: qty 1

## 2011-07-16 NOTE — ED Notes (Signed)
Patient transported to X-ray 

## 2011-07-16 NOTE — ED Notes (Addendum)
Per pt report, pt with sudden onset of chest pain at 1300 2 hours after eating salad and apple.  Pt describes pain as intermittent dull with burning sensation.  Per pt report, pt took one sublingual nitroglycerin at 1300 today which completely resolved pain.  Per pt report, pain returned about 1 hour ago.  Pt currently endorses pain at this time.  EKG done prior to arrival in pt treatment room.

## 2011-07-16 NOTE — ED Notes (Signed)
Pt presents with sudden onset of L sided chest pain while seated at work today at 1300.  Pt reports pain is to L side, has been intermittently sharp but now is a dull ache.  Pt reports he was seated at work at time of onset.  -shortness of breath, nausea or diaphoresis.  Pt reports episode began with a "burning" in chest.  Pt reports h/o stent placement in January and revised in June with similar symptoms except shortness of breath.

## 2011-07-16 NOTE — ED Notes (Signed)
Pt with no acute distress at this time. Per pt report, pt with decreased pain and increased comfort.  No needs requested when assistance offered at this time.  MD at bedside for assessment.

## 2011-07-16 NOTE — ED Notes (Addendum)
Charge RN from 3700 down to check patient's status.  Per 3700 Charge RN, report can be called for pt once pt is pain free. Per 3700 Charge RN, pt cannot go to 3700 if pain persists. Pt report dull burning sensation in chest at this time.  Pt endorses 3/10 pain.

## 2011-07-16 NOTE — H&P (Signed)
Reason for Admission: Chest pain/Unstable angina  Chief complaint: Chest pain   HPI: This is a 65 y.o. male with a past medical history significant for coronary artery disease status post stent to the first oblique marginal artery performed in January of 2012 (15 x 2.0 mm mini vision stent performed by Dr. Nanetta Batty). Repeat cardiac catheterization performed in February for recurrence of symptoms showed a widely patent stent. In July the patient again returned with anterior chest "burning" and a nuclear stress that showed evidence of ischemia. Cardiac catheterization confirmed the presence of 90% in-stent restenosis which was treated by Dr. Daphene Jaeger with Cutting Balloon angioplasty.   He has been asymptomatic until today when he developed chest pain at rest improved with sublingual nitroglycerin. He has been walking on a daily basis and this is the first time he has experienced a recurrence of the burning-like chest discomfort since the last coronary procedure. Previous episodes of angina were associated with shortness of breath on exertion which has not been manifest on this admission.  He does not smoke and has diabetes mellitus which is reportedly well controlled. Unfortunately has been intolerant of most statins. Most recently he started taking Livalo 4 mg daily and had to reduce the dose to 2 mg daily since he again developed leg pain and cramps. He has tried virtually every other statin in the past.  PMHx:  Past Medical History  Diagnosis Date  . Coronary artery disease   . Diabetes mellitus   . Hypertension   . CHF (congestive heart failure)    Past Surgical History  Procedure Date  . Coronary stent placement   . Femoral artery stent     FAMHx: No family history on file.there is no family history of premature coronary disease.  SOCHx:  reports that he has quit smoking. He does not have any smokeless tobacco history on file. He reports that he does not drink alcohol or use  illicit drugs. He is married. He works as a Research scientist (medical).   ALLERGIES: Allergies  Allergen Reactions  . Fluconazole   . Penicillins   . Propoxyphene N-Acetaminophen     ROS: Constitutional: negative for chills, fevers and night sweats Eyes: negative for visual disturbance Ears, nose, mouth, throat, and face: negative for epistaxis, hearing loss, hoarseness, sore throat and tinnitus Respiratory: negative for cough, dyspnea on exertion, hemoptysis, sputum and wheezing Cardiovascular: negative for claudication, lower extremity edema, orthopnea, palpitations, paroxysmal nocturnal dyspnea and syncope Gastrointestinal: negative for abdominal pain, constipation, diarrhea, dysphagia, melena, nausea and vomiting Genitourinary:negative Integument/breast: negative Hematologic/lymphatic: negative for bleeding, easy bruising and petechiae Musculoskeletal:positive for myalgias Neurological: negative for dizziness, gait problems, headaches, seizures, speech problems, tremors and weakness Behavioral/Psych: negative for anxiety, depression and mood swings Endocrine: negative for diabetic symptoms including polydipsia, polyphagia and polyuria   HOME MEDICATIONS:  (Not in a hospital admission)  HOSPITAL MEDICATIONS: Prior to Admission: reviewed  VITALS: Blood pressure 153/77, pulse 54, temperature 98.3 F (36.8 C), temperature source Oral, resp. rate 16, SpO2 100.00%.  PHYSICAL EXAM: General appearance: alert, cooperative and no distress Neck: no adenopathy, no carotid bruit, no JVD, supple, symmetrical, trachea midline and thyroid not enlarged, symmetric, no tenderness/mass/nodules Lungs: clear to auscultation bilaterally Heart: regular rate and rhythm, S1, S2 normal, no murmur, click, rub or gallop Abdomen: soft, non-tender; bowel sounds normal; no masses,  no organomegaly Extremities: extremities normal, atraumatic, no cyanosis or edema Pulses: 2+ and symmetric; A small chronic cyst  is located over the radial artery  on the right and predated previous radial cath Skin: Skin color, texture, turgor normal. No rashes or lesions Neurologic: Alert and oriented X 3, normal strength and tone. Normal symmetric reflexes. Normal coordination and gait  LABS: Results for orders placed during the hospital encounter of 07/16/11 (from the past 48 hour(s))  CBC     Status: Normal   Collection Time   07/16/11  4:18 PM      Component Value Range Comment   WBC 6.2  4.0 - 10.5 (K/uL)    RBC 4.45  4.22 - 5.81 (MIL/uL)    Hemoglobin 14.5  13.0 - 17.0 (g/dL)    HCT 82.9  56.2 - 13.0 (%)    MCV 94.8  78.0 - 100.0 (fL)    MCH 32.6  26.0 - 34.0 (pg)    MCHC 34.4  30.0 - 36.0 (g/dL)    RDW 86.5  78.4 - 69.6 (%)    Platelets 189  150 - 400 (K/uL)   BASIC METABOLIC PANEL     Status: Abnormal   Collection Time   07/16/11  4:18 PM      Component Value Range Comment   Sodium 139  135 - 145 (mEq/L)    Potassium 4.1  3.5 - 5.1 (mEq/L)    Chloride 105  96 - 112 (mEq/L)    CO2 28  19 - 32 (mEq/L)    Glucose, Bld 102 (*) 70 - 99 (mg/dL)    BUN 11  6 - 23 (mg/dL)    Creatinine, Ser 2.95  0.50 - 1.35 (mg/dL)    Calcium 28.4  8.4 - 10.5 (mg/dL)    GFR calc non Af Amer 85 (*) >90 (mL/min)    GFR calc Af Amer >90  >90 (mL/min)   POCT I-STAT TROPONIN I     Status: Normal   Collection Time   07/16/11  4:44 PM      Component Value Range Comment   Troponin i, poc 0.00  0.00 - 0.08 (ng/mL)    Comment 3              IMAGING: Dg Chest 2 View  07/16/2011  *RADIOLOGY REPORT*  Clinical Data: Chest pain.  CHEST - 2 VIEW  Comparison: Chest x-ray 02/07/2011.  Findings: The cardiac silhouette, mediastinal and hilar contours are within normal limits and stable.  There are chronic bronchitic type lung changes but no acute overlying pulmonary process.  No pleural effusion.  The bony thorax is intact.  IMPRESSION: Chronic lung changes but no acute overlying pulmonary process.  Original Report Authenticated By: P.  Loralie Champagne, M.D.    IMPRESSION: 1. Unstable angina pectoris in a patient with known previous instent restenosis and diabetes mellitus. The risk of repeat restenosis is therefore at least 60%. The timing is slightly premature at 5 months this is the most likely diagnosis.  There are no high-risk features. There are no electrocardiographic changes. The cardiac enzymes are normal (at least the initial set) and he no longer has ongoing chest discomfort.for now we will not start heparin or Integrilin.  Recommend left heart catheterization and coronary angiography tomorrow. If the diagnosis of in stent restenosis confirmed options include a referral for intracoronary brachii therapy (this will require transfer to [facility]) for placement of a drug-eluting stent. The latter option is not very attractive due to the small size of the initial bare-metal stent (only 2 mm in diameter). Since this is the only vessel involved and it is not of  critical importance single-vessel coronary bypass surgery while considered is not felt to be an optimal solution.  The risks and benefits of diagnostic cardiac catheterization and potential repeat percutaneous intervention were discussed in detail he understands and agrees to proceed.  His inability to tolerate statin therapy continues to be a big problem. While it may not be directly involved in his recurrent restenosis it does place him at high risk of recurrent coronary disease in the future.  He has implemented the number of healthy lifestyle changes and is managed with 15 pounds since his last hospitalization. He does appear to be committed to healthy lifestyle changes.  RECOMMENDATION: 1. Repeat coronary angiography in a.m. He prefers radial approach. We'll schedule with Dr. Herbie Baltimore.  Time Spent Directly with Patient: 60 minutes  Thurmon Fair, MD, [facility] [phone] 07/16/2011, 6:10 PM

## 2011-07-16 NOTE — ED Notes (Signed)
Patient transported from X-ray 

## 2011-07-16 NOTE — ED Provider Notes (Signed)
History     CSN: [id number] Arrival date & time: 07/16/2011  2:07 PM   First MD Initiated Contact with Patient 07/16/11 1630      Chief Complaint  Patient presents with  . Chest Pain    Patient is a 65 y.o. male presenting with chest pain. The history is provided by the patient.  Chest Pain The chest pain began 3 - 5 hours ago. Episode Length: a minute. Chest pain occurs frequently. The chest pain is unchanged. Associated with: nothing. The severity of the pain is moderate. The quality of the pain is described as burning. The pain does not radiate. Exacerbated by: nothing. Pertinent negatives for primary symptoms include no shortness of breath and no dizziness.  Pertinent negatives for associated symptoms include no diaphoresis and no weakness. He tried nitroglycerin for the symptoms.   Pt tells me this is very similar to prior episodes of his ACS  Past Medical History  Diagnosis Date  . Coronary artery disease   . Diabetes mellitus   . Hypertension   . CHF (congestive heart failure)     Past Surgical History  Procedure Date  . Coronary stent placement   . Femoral artery stent     No family history on file.  History  Substance Use Topics  . Smoking status: Former Games developer  . Smokeless tobacco: Not on file  . Alcohol Use: No      Review of Systems  Constitutional: Negative for diaphoresis.  Respiratory: Negative for shortness of breath.   Cardiovascular: Positive for chest pain.  Neurological: Negative for dizziness and weakness.  All other systems reviewed and are negative.    Allergies  Fluconazole; Penicillins; and Propoxyphene n-acetaminophen  Home Medications   Current Outpatient Rx  Name Route Sig Dispense Refill  . ASPIRIN EC 325 MG PO TBEC Oral Take 325 mg by mouth daily.      Marland Kitchen CLOPIDOGREL BISULFATE 75 MG PO TABS Oral Take 75 mg by mouth daily.      . DEXLANSOPRAZOLE 60 MG PO CPDR Oral Take 60 mg by mouth daily.      . GLYBURIDE-METFORMIN 5-500 MG  PO TABS Oral Take 1 tablet by mouth daily with breakfast.      . METOPROLOL SUCCINATE ER 25 MG PO TB24 Oral Take 25 mg by mouth daily.      Marland Kitchen NITROGLYCERIN 0.4 MG SL SUBL Sublingual Place 0.4 mg under the tongue every 5 (five) minutes as needed. For chest pain     . PITAVASTATIN CALCIUM 2 MG PO TABS Oral Take 1 tablet by mouth 2 (two) times a week. Mondays and Thursdays     . RAMIPRIL 5 MG PO CAPS Oral Take 5 mg by mouth daily.        BP 157/72  Pulse 60  Temp(Src) 98.3 F (36.8 C) (Oral)  Resp 16  SpO2 100%  Physical Exam  CONSTITUTIONAL: Well developed/well nourished HEAD AND FACE: Normocephalic/atraumatic EYES: EOMI/PERRL ENMT: Mucous membranes moist NECK: supple no meningeal signs CV: S1/S2 noted, no murmurs/rubs/gallops noted LUNGS: Lungs are clear to auscultation bilaterally, no apparent distress ABDOMEN: soft, nontender, no rebound or guarding GU:no cva tenderness NEURO: Pt is awake/alert, moves all extremitiesx4 EXTREMITIES: pulses normal, full ROM SKIN: warm, color normal PSYCH: no abnormalities of mood noted   ED Course  Procedures   Labs Reviewed  I-STAT TROPONIN I  CBC  BASIC METABOLIC PANEL   8:65 PM Pt stable at this time, will start cardiac workup  5:37 PM  Pt given ASA No longer having CP D/w dr Benedict Needy, will see patient in ED Pt stable in the ED  MDM  Nursing notes reviewed and considered in documentation All labs/vitals reviewed and considered Previous records reviewed and considered xrays reviewed and considered        Date: 07/16/2011  Rate: 63  Rhythm: normal sinus rhythm  QRS Axis: normal  Intervals: normal  ST/T Wave abnormalities: nonspecific ST changes  Conduction Disutrbances:none  Narrative Interpretation:   Old EKG Reviewed: unchanged    Joya Gaskins, MD 07/16/11 [phone]

## 2011-07-16 NOTE — ED Notes (Signed)
Pt to be admitted.  Pt still without bed assignment at this time. Plan of care discussed with pt. Pt verbalized understanding.

## 2011-07-17 ENCOUNTER — Other Ambulatory Visit: Payer: Self-pay

## 2011-07-17 ENCOUNTER — Encounter (HOSPITAL_COMMUNITY)
Admission: EM | Disposition: A | Discharge status: Home / Self Care | Payer: Self-pay | Source: Home / Self Care | Attending: Cardiovascular Disease

## 2011-07-17 HISTORY — PX: LEFT HEART CATHETERIZATION WITH CORONARY ANGIOGRAM: SHX5451

## 2011-07-17 HISTORY — PX: CARDIAC CATHETERIZATION: SHX172

## 2011-07-17 HISTORY — PX: CORONARY STENT INTERVENTION: CATH118234

## 2011-07-17 LAB — GLUCOSE, CAPILLARY
Glucose-Capillary: 134 mg/dL — ABNORMAL HIGH (ref 70–99)
Glucose-Capillary: 119 mg/dL — ABNORMAL HIGH (ref 70–99)
Glucose-Capillary: 119 mg/dL — ABNORMAL HIGH (ref 70–99)

## 2011-07-17 LAB — CARDIAC PANEL(CRET KIN+CKTOT+MB+TROPI)
CK, MB: 3.4 ng/mL (ref 0.3–4.0)
Relative Index: 1.3 (ref 0.0–2.5)
Total CK: 313 U/L — ABNORMAL HIGH (ref 7–232)
Total CK: 291 U/L — ABNORMAL HIGH (ref 7–232)
Troponin I: <0.3 ng/mL (ref <0.30)

## 2011-07-17 LAB — LIPID PANEL: Total CHOL/HDL Ratio: 4.8 RATIO

## 2011-07-17 LAB — POCT ACTIVATED CLOTTING TIME: Activated Clotting Time: 788 seconds

## 2011-07-17 SURGERY — LEFT HEART CATHETERIZATION WITH CORONARY ANGIOGRAM
Anesthesia: LOCAL

## 2011-07-17 MED ORDER — SODIUM CHLORIDE 0.9 % IV SOLN: 250.0000 mL | INTRAVENOUS | Status: DC | PRN

## 2011-07-17 MED ORDER — ASPIRIN 81 MG PO CHEW: 324.0000 mg | CHEWABLE_TABLET | ORAL | Status: DC

## 2011-07-17 MED ORDER — SODIUM CHLORIDE 0.9 % IV SOLN: 1.0000 mL/kg/h | INTRAVENOUS | Status: AC

## 2011-07-17 MED ORDER — MORPHINE SULFATE 2 MG/ML IJ SOLN
1.0000 mg | INTRAMUSCULAR | Status: DC | PRN
  Administered 2011-07-17 (×2): 1 mg via INTRAVENOUS

## 2011-07-17 MED ORDER — ACETAMINOPHEN 325 MG PO TABS
650.0000 mg | ORAL_TABLET | ORAL | Status: DC | PRN
  Administered 2011-07-17 – 2011-07-18 (×3): 650 mg via ORAL
  Filled 2011-07-17: qty 1
  Filled 2011-07-17 (×2): qty 2

## 2011-07-17 MED ORDER — SODIUM CHLORIDE 0.9 % IV SOLN
INTRAVENOUS | Status: DC
  Administered 2011-07-17: 02:00:00 via INTRAVENOUS

## 2011-07-17 MED ORDER — VERAPAMIL HCL 2.5 MG/ML IV SOLN
INTRAVENOUS | Status: AC
  Filled 2011-07-17: qty 2

## 2011-07-17 MED ORDER — CLOPIDOGREL BISULFATE 75 MG PO TABS
300.0000 mg | ORAL_TABLET | ORAL | Status: AC
  Administered 2011-07-17: 300 mg via ORAL
  Filled 2011-07-17: qty 4

## 2011-07-17 MED ORDER — MIDAZOLAM HCL 2 MG/2ML IJ SOLN
INTRAMUSCULAR | Status: AC
  Filled 2011-07-17: qty 2

## 2011-07-17 MED ORDER — ASPIRIN 81 MG PO CHEW
324.0000 mg | CHEWABLE_TABLET | ORAL | Status: AC
  Administered 2011-07-17: 324 mg via ORAL

## 2011-07-17 MED ORDER — FENTANYL CITRATE 0.05 MG/ML IJ SOLN
INTRAMUSCULAR | Status: AC
  Filled 2011-07-17: qty 2

## 2011-07-17 MED ORDER — NITROGLYCERIN 0.2 MG/ML ON CALL CATH LAB
INTRAVENOUS | Status: AC
  Filled 2011-07-17: qty 1

## 2011-07-17 MED ORDER — BIVALIRUDIN 250 MG IV SOLR
INTRAVENOUS | Status: AC
  Filled 2011-07-17: qty 250

## 2011-07-17 MED ORDER — SODIUM CHLORIDE 0.9 % IJ SOLN
3.0000 mL | Freq: Two times a day (BID) | INTRAMUSCULAR | Status: DC
  Administered 2011-07-18: 3 mL via INTRAVENOUS

## 2011-07-17 MED ORDER — ONDANSETRON HCL 4 MG/2ML IJ SOLN: 4.0000 mg | Freq: Four times a day (QID) | INTRAMUSCULAR | Status: DC | PRN

## 2011-07-17 MED ORDER — LIDOCAINE HCL (PF) 1 % IJ SOLN
INTRAMUSCULAR | Status: AC
  Filled 2011-07-17: qty 30

## 2011-07-17 MED ORDER — MORPHINE SULFATE 4 MG/ML IJ SOLN
INTRAMUSCULAR | Status: AC
  Filled 2011-07-17: qty 1

## 2011-07-17 MED ORDER — NITROGLYCERIN IN D5W 200-5 MCG/ML-% IV SOLN
10.0000 ug/min | INTRAVENOUS | Status: DC
  Administered 2011-07-17: 10 ug/min via INTRAVENOUS

## 2011-07-17 MED ORDER — CLOPIDOGREL BISULFATE 75 MG PO TABS
75.0000 mg | ORAL_TABLET | Freq: Every day | ORAL | Status: DC
  Administered 2011-07-18: 75 mg via ORAL

## 2011-07-17 MED ORDER — SODIUM CHLORIDE 0.9 % IJ SOLN: 3.0000 mL | Freq: Two times a day (BID) | INTRAMUSCULAR | Status: DC

## 2011-07-17 MED ORDER — SODIUM CHLORIDE 0.9 % IJ SOLN: 3.0000 mL | INTRAMUSCULAR | Status: DC | PRN

## 2011-07-17 MED ORDER — HEPARIN SODIUM (PORCINE) 1000 UNIT/ML IJ SOLN
INTRAMUSCULAR | Status: AC
  Filled 2011-07-17: qty 1

## 2011-07-17 MED ORDER — HEPARIN (PORCINE) IN NACL 2-0.9 UNIT/ML-% IJ SOLN
INTRAMUSCULAR | Status: AC
  Filled 2011-07-17: qty 2000

## 2011-07-17 MED ORDER — NITROGLYCERIN IN D5W 200-5 MCG/ML-% IV SOLN
INTRAVENOUS | Status: AC
  Filled 2011-07-17: qty 250

## 2011-07-17 MED ORDER — ASPIRIN 81 MG PO CHEW
81.0000 mg | CHEWABLE_TABLET | Freq: Every day | ORAL | Status: DC
  Administered 2011-07-18: 81 mg via ORAL
  Filled 2011-07-17 (×2): qty 1

## 2011-07-17 NOTE — Op Note (Signed)
[facility]     CARDIAC CATHETERIZATION REPORT  NAME: @PTNAME @  MRN: [id number] DOB: Mar 26, 1946  ADMIT DATE:   Performing Cardiologist: QMVHQIO,NGEXB W  Primary Physician: Evlyn Courier, MD Primary Cardiologist:  Nathanial Millman, MD  Procedures Performed:  Left Heart Catheterization via 5 Fr Right Radial Artery access  Left Ventriculography, (RAO1)1 ml/sec for 33 ml total contrast  Native Coronary Angiography  Percutaneous Coronary Artery Intervention on proximal OM1 in-stent restenosis with a 2.25 mm x 20 mm Promus DES; final diameter 2.38 mm  Indication(s): Unstable Angina  CAD - s/p BMS (2.0 mm x 15 mm) to prox OM1 (Jan 2012) --> ISR treated with Cutting Balllon PTCA (02/2011)  Diabetes  Dyslipidemia  Hypertension   History: This is a 65 y.o. male with a past medical history significant for coronary artery disease status post stent to the first oblique marginal artery performed in January of 2012 (15 x 2.0 mm mini vision stent performed by Dr. Nanetta Batty). Repeat cardiac catheterization performed in February for recurrence of symptoms showed a widely patent stent. In July the patient again returned with anterior chest "burning" and a nuclear stress that showed evidence of ischemia. Cardiac catheterization confirmed the presence of 90% in-stent restenosis which was treated by Dr. Daphene Jaeger with Cutting Balloon angioplasty. He has been asymptomatic until 07/16/11 when he developed chest pain at rest improved with sublingual nitroglycerin. He has been walking on a daily basis and this is the first time he has experienced a recurrence of the burning-like chest discomfort since the last coronary procedure. Previous episodes of angina were associated with shortness of breath on exertion which has not been manifest on this admission.   He is now referred for invasive cardiac evaluation.  Consent: The procedure with Risks/Benefits/Alternatives and  Indications was reviewed with the patent.  All questions were answered.   Risks / Complications include, but not limited to: Death, MI, CVA/TIA, VF/VT (with defibrillation), Bradycardia (need for temporary pacer placement), contrast induced nephropathy, bleeding / bruising / hematoma / pseudoaneurysm, vascular or coronary injury (with possible emergent CT or Vascular Surgery), adverse medication reactions, infection.    The patient voiced understanding and agreed to proceed.  Consent for signed by MD and patient with RN witness -- placed on chart.  Procedure: The patient was brought to the 2nd Floor [facility] in the fasting state and prepped and draped in the usual sterile fashion for (Right groin or radial) access.  A modified Allen's test with plethysmography was performed on the right wrist demonstrating adequate Ulnar Artery collateral flow.   Sterile technique was used including antiseptics, cap, gloves, gown, hand hygiene, mask and sheet. Skin prep: Chlorhexidine;  Time Out: Verified patient identification, verified procedure, site/side was marked, verified correct patient position, special equipment/implants available, medications/allergies/relevent history reviewed, required imaging and test results available.  Performed  The right wrist was anesthetized with 1% subcutaneous Lidocaine.  The right radial artery was accessed using the Seldinger Technique with placement of a 5 Fr Glide Sheath. The sheath was aspirated and flushed.  Then a total of 10 ml of standard Radial Artery Cocktail (see medications) was infused.  Radial Cocktail: 5 mg Verapamil, 400 mcg NTG, 2 ml 2% Lidocaine  A 5 Fr TIG 4.0 Catheter was advanced of over a Long Exchange Safety J wire into the ascending Aorta.  The catheter was used to engage the Right and Left coronary artery.  Multiple cineangiographic views of both the  Left and Right coronary artery system(s) were performed.   This catheter  was then exchanged over the long Safety J wire and advanced across the Aortic Valve over a wire.  LV hemodynamics were measured and Left Ventriculography was performed.LV hemodynamics were then re-sampled, and the catheter was pulled back across the Aortic Valve for measurement of "pull-back" gradient.  The catheter was removed completely out of the body over the [employer] J wire.  Hemodynamics:  Central Aortic Pressure: 148/98 mmHg  LV Pressure / LV End diastolic Pressure: 148/20 mmHg; 18 mmHg  Left Ventriculogram:  LVEF:  55-60%  Wall motion:  No focal abnormalities  Coronary Angiographic Data:   Dominance: Right, with extensive Left Posterolateral Circulation  Left Main:  Large caliber, branches into LAD & LCx,angiographically normal  Left Anterior Descending (LAD):  Large to moderate caliber, angiographically normal, several proximal and mid septal perforator trunks and 2 major diagonal branches  1st diagonal (D1):  Moderate caliber, angiographically normal  2nd diagonal (D2):  Moderate-small caliber, angiographically normal  Circumflex (LCx):  Large caliber, 2 small proximal branches including an atrial branch, courses into the AV-Groove after moderate caliber OM1, then has several small-moderate caliber  LPL branches.  Minimal luminal irregularities with the exception of OM1 and small LPL1.  1st obtuse marginal:  Smalll-moderate caliber, just at proximal superior branch which is immediately proximal to the previously placed stent, there is a hazy ~80% defect the continues ~2/3 the way into the stent, up to a second small caliber inferior branch.  Right Coronary Artery: Moderate caliber, torutous with diffuse luminal irregularities including proximal 20-30% lesions.  Bifurcates distally into small Right Posterior Descending Artery (RPDA) and  Right Atrio-ventricular Groove Branch (RPAVB) that only elaborates 2 small RPL branches.  RPDA:  Small caliber, angiographically  normal  RPAVB-RPL: Small caliber, angiographically normal  PERCUTANEOUS CORONARY INTERVENTION PROCEDURE  After reviewing the initial cineangiography images, the culprit lesion in the proximal OM1 was identified, and the decision was made to proceed with percutaneous coronary interventoin with direct stenting with a DES due to this being his second admission for restenosis of a BMS.  The 5Fr Sheath was exchanged over a wire for a 6Fr sheath.   A weight based bolus of IV Angiomax was administered and the drip was continued until completion of the procedure.   Oral 300mg  Clopidogrel was administered prior to the procedure.  The 6Fr Voda Left 3.5 Guide catheter was advanced over a J-wire and used to engage the right Coronary Artery.  An ACT of > 200 Sec was confirmed prior to advancing the Guidewire.  Initially, a Prowater wire was advanced easily into the OM1 beyond the lesion, however, this did not provide adequate support to advance the stent into the branch vessel.  I then used a PT2MS Medium weight wire as a second buddy wire.  I was able to advance the stent over this wire most of the way into the OM1, unfortunately, the wire was pulled back while removing the Prowater wire --likelydue to the wires being twisted together.  I the re-attempted to perform this step with the PT2MS as the main wire and the Prowater as the buddy wire, but was not able to advance the stent.  I then chose to switch to a [employer] wire (heavy weight) which was easily advanced into the OM1, and the stent passed easily.  The position was verified angiogaphically in 2 views to ensure that the stent did not encroach on  the main-stem LCx.  The stent was then deployed, and the ballon was re-inflated for post-dilation.  IC Nitroglycerin injections were administered prior to using the Sport wire and after stent deployment. Angiography in several views pre-and post wire removal showed excellent stent expansion and no  evidence of dissection or perforation.  Lesion #1:  Prox OM1 In-stent restenosis  Pre-PCI Stenosis: 80 % Post-PCI Stenosis: 0 %     TIMI 3 flow       TIMI 3 flow  Guide Catheter: VL3.5  Guidewire: HT S'port  STENT: Promus DES 2.5 mm x 20 mm   1st Inflation:  12 Atm for 45 Sec   2nd Inflation: 16 Atm for 100 Sec -- final diameter: 2.38 mm   Post deployment angiography did not reveal evidence of dissection or perforation. Excellent stent expansion.  The sheath was removed in the Cath Lab with a TR band placed at 12ml Air at 1326 hrs. Reverse Allen's test with plethysmography revealed non-occlusive hemostasis.  The patient was transported to the PACU in stable condition.  The patient  was stable before, during and following the procedure.  Patient did tolerate procedure well.  There were not complications. EBL:<59ml  Medications:  Premedication: 5mg   Valium  Sedation:  6 mg IV Versed, 150 mcg IV Fentanyl  Contrast: Omnipaque  Radial Cocktail: 5 mg Verapamil, 400 mcg NTG, 2 ml 2% Lidocaine  IV Heparin: 5000 Units after sheath placement  Angiomax bolus & drip: (0.75mg /kg; 1.75 mg/kg/hr)  IC Nitroglycerin: x 2  Impression: 1.  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 2.  Successful PCI of prox OM1 lesion with Promus DES 2.25 mm x 20 mm (post dilated to 2.38 mm). 3.  Preserved LV Function. EF ~55-60%.  Plan: 1.  Monitor overnight, IV Nitroglycerin, as he may well have recoil spasm after expanding the previous stent. 2.  Anticipate discharge in AM on current medication.   3.  Dual Antiplatelet for at least 1 year.  Consider checking P2Y12 level pre-discharge.  The case and results was discussed with the patient.. The case and results was not discussed with the patient's PCP. The case and results was discussed with the patient's Cardiologist.  Time Spend Directly with Patient:  2hr 30  minutes  HARDING,DAVID W, M.D., M.S. [facility] [address] [place], [place]  [address]  [phone]  07/17/2011 6:58 PM

## 2011-07-17 NOTE — Brief Op Note (Signed)
07/16/2011 - 07/17/2011  1:41 PM  PATIENT:  [name]  65 y.o. male with a past medical history significant for coronary artery disease status post stent to the first oblique marginal artery performed in January of 2012 (15 x 2.0 mm mini vision stent performed by Dr. Nanetta Batty). Repeat cardiac catheterization performed in February for recurrence of symptoms showed a widely patent stent. In July the patient again returned with anterior chest "burning" and a nuclear stress that showed evidence of ischemia. Cardiac catheterization confirmed the presence of 90% in-stent restenosis which was treated by Dr. Daphene Jaeger with Cutting Balloon angioplasty.  He has been asymptomatic until today when he developed chest pain at rest improved with sublingual nitroglycerin. He has been walking on a daily basis and this is the first time he has experienced a recurrence of the burning-like chest discomfort since the last coronary procedure. Previous episodes of angina were associated with shortness of breath on exertion which has not been manifest on this admission.  PRE-OPERATIVE DIAGNOSIS:  Unstable Angina  POST-OPERATIVE DIAGNOSIS:  Unstable Angina.  -- s/p successful PCI of 80% proximal OM1 stent ISR with Promus DES 2.25 mm x 20 mm.  PROCEDURES:    LEFT HEART CATHETERIZATION WITH CORONARY ANGIOGRAPHY  - via 5Fr Right Radial Artery Access  LEFT VENTRICULOGRAM  PERCUTANEOUS CORONARY INTERVENTION OF PROXIMAL OM1 BRANCH IN-STENT RESTENOSIS   INTRACORONARY NITROGLYCERIN INJECTION -- 6Fr Right Radial Artery Access.  Surgeon(s): Marykay Lex, MD,MS Julieanne Manson, MD  ASSISTANTS:  Kristeen Miss, RN  ANESTHESIA:   local and IV sedation; Fentanyl , Versed 6 mg  EBL:    <5ml  BLOOD ADMINISTERED:none  DEVICES: Promus DES 2.25 mm x 20 mm - final diameter 2.38 mm. DIAGNOSTIC CATHETER: 5Fr TIG 4.0, 5 Fr Angled Pigtail catheter GUIDE CATHETER: 6Fr VodaL 3.5; final Guidewire: [employer]  Sport  LOCAL MEDICATIONS USED:  LIDOCAINE 2 ml MEDICATIONS:   IC NITROGLYCERIN - x 3 Radial Cocktail: 5 mg Verapamil, 400 mcg NTG, 2 ml 2% Lidocaine IV Heparin: 5000 Units IV Angiomax: Bolus (0.75mg  / kg); drip (1.75mg /kg/hr) - during PCI.  TOURNIQUET: TR Band, 12 ATM, 1326 hrs.  DICTATION: .Note written in EPIC  PLAN OF CARE: Monitor overnight in post procedure ward, 2500.  Anticipate d/c in AM.  PATIENT DISPOSITION:  PACU - hemodynamically stable.   Delay start of Pharmacological VTE agent (>24hrs) due to surgical blood loss or risk of bleeding:  N/A.   Marykay Lex, M.D., M.S. [facility] [address] [place], [place]  [address]  [phone]  07/17/2011 1:46 PM

## 2011-07-17 NOTE — Progress Notes (Signed)
Patient ID: DONTAVIUS KEIM, male   DOB: [birth year], 65 y.o.   MRN: [id number]  [facility] DAILY PROGRESS NOTE  NAME:  [name]   MRN: [id number] DOB:  May 03, 1946   ADMIT DATE: 07/16/2011   Patient Description   This is a 65 y.o. male with a past medical history significant for coronary artery disease status post stent to the first oblique marginal artery performed in January of 2012 (15 x 2.0 mm mini vision stent performed by Dr. Nanetta Batty). Repeat cardiac catheterization performed in February for recurrence of symptoms showed a widely patent stent. In July the patient again returned with anterior chest "burning" and a nuclear stress that showed evidence of ischemia. Cardiac catheterization confirmed the presence of 90% in-stent restenosis which was treated by Dr. Daphene Jaeger with Cutting Balloon angioplasty.   He has been asymptomatic until 07/16/11 when he developed chest pain at rest improved with sublingual nitroglycerin. He has been walking on a daily basis and this is the first time he has experienced a recurrence of the burning-like chest discomfort since the last coronary procedure. Previous episodes of angina were associated with shortness of breath on exertion which has not been manifest on this admission.    Subjective: Rested well o/n  No further episodes of CP since ED.  Objective:  Temp:  [97.7 F (36.5 C)-98.3 F (36.8 C)] 97.7 F (36.5 C) (12/06 0554) Pulse Rate:  [53-73] 60  (12/06 0554) Resp:  [16-18] 18  (12/06 0554) BP: (113-157)/(54-77) 118/73 mmHg (12/06 0554) SpO2:  [95 %-100 %] 95 % (12/06 0554) Weight:  [96.9 kg (213 lb 10 oz)-97.3 kg (214 lb 8.1 oz)] 214 lb 8.1 oz (97.3 kg) (12/06 0659) Weight change:   Intake/Output from previous day: 12/05 0701 - 12/06 0700 In: -  Out: 500 [Urine:500]  Intake/Output Summary (Last 24 hours) at 07/17/11 0817 Last data filed at 07/16/11 2000  Gross per 24 hour  Intake      0 ml  Output     500 ml  Net   -500 ml    Physical Exam: General appearance: alert, cooperative, appears stated age and no distress Neck: no adenopathy, no carotid bruit, no JVD, supple, symmetrical, trachea midline and thyroid not enlarged, symmetric, no tenderness/mass/nodules Lungs: clear to auscultation bilaterally and non-labored, good air movement. Heart: regular rate and rhythm, S1, S2 normal, no murmur, click, rub or gallop Abdomen: soft, non-tender; bowel sounds normal; no masses,  no organomegaly Extremities: extremities normal, atraumatic, no cyanosis or edema Pulses: 2+ and symmetric Skin: Skin color, texture, turgor normal. No rashes or lesions Neurologic: Alert and oriented X 3, normal strength and tone. Normal symmetric reflexes. Normal coordination and gait  Lab Results: CBC    Component Value Date/Time   WBC 6.7 07/16/2011 1841   RBC 4.32 07/16/2011 1841   HGB 14.3 07/16/2011 1841   HCT 40.9 07/16/2011 1841   PLT 177 07/16/2011 1841   MCV 94.7 07/16/2011 1841   MCH 33.1 07/16/2011 1841   MCHC 35.0 07/16/2011 1841   RDW 12.4 07/16/2011 1841   LYMPHSABS 2.0 09/10/2010 1457   MONOABS 0.3 09/10/2010 1457   EOSABS 0.0 09/10/2010 1457   BASOSABS 0.0 09/10/2010 1457   CMP     Component Value Date/Time   NA 139 07/16/2011 1841   K 4.3 07/16/2011 1841   CL 105 07/16/2011 1841   CO2 29 07/16/2011 1841   GLUCOSE 100* 07/16/2011 1841   BUN 10  07/16/2011 1841   CREATININE 0.99 07/16/2011 1841   CALCIUM 10.1 07/16/2011 1841   PROT 7.2 07/16/2011 1841   ALBUMIN 3.8 07/16/2011 1841   AST 18 07/16/2011 1841   ALT 19 07/16/2011 1841   ALKPHOS 52 07/16/2011 1841   BILITOT 0.8 07/16/2011 1841   GFRNONAA 85* 07/16/2011 1841   GFRAA >90 07/16/2011 1841   troponins - r/o MI  Imaging: CXR - reviewed, no acute abnormalities  Cath:  Films reviewed. NST:  01/2011 - pre PTCA showed Inferolateral Ischemia  Clinical Course: Admitted over night -IV Heparin  No further chest pain since arrival to ED    [facility]  Reviewed    . aspirin      . aspirin  324 mg Oral NOW  . aspirin  324 mg Oral Pre-Cath  . aspirin EC  325 mg Oral Daily  . clopidogrel  300 mg Oral Pre-Cath  . clopidogrel  75 mg Oral Q breakfast  . clopidogrel  75 mg Oral To Major  . glyBURIDE  5 mg Oral Q breakfast  . metFORMIN  500 mg Oral Q breakfast  . metoprolol succinate  25 mg Oral Daily  . metoprolol succinate  25 mg Oral To Major  . nitroGLYCERIN      . pantoprazole  40 mg Oral Q1200  . pantoprazole  40 mg Oral To Major  . Pitavastatin Calcium  1 tablet Oral 2 times weekly  . ramipril  5 mg Oral Daily  . sodium chloride  3 mL Intravenous Q12H  . DISCONTD: aspirin  324 mg Oral Pre-Cath  . DISCONTD: aspirin  325 mg Oral Daily  . DISCONTD: glyBURIDE-metformin  1 tablet Oral Q breakfast      . sodium chloride 100 mL/hr at 07/17/11 1191     Assessment/Plan:  Principal Problem:  *Unstable angina pectoris Active Problems:  CAD (coronary artery disease), native coronary artery  Stented coronary artery  DM (diabetes mellitus)  Dyslipidemia  Statin intolerance  Proceed with cardiac catheterization today.  He has requested radial access.  Dr Royann Shivers discussed the risks/benefits/ alternatives / complications of the procedure with him.  He agrees to proceed.   He has been on dual antiplatelet agents since his peripheral PTA.   Continue with oral hypoglycenic post cath   He is intolerant of statins, but has embraced lifestyle modifications.  May need to consider fibrate or other alternative upon d/c.   Time Spent Directly with Patient:  10 minutes  Length of Stay:  LOS: 1 day    HARDING,DAVID W, M.D., M.S. [facility] [address] [place], [place]  [address]  [phone]  07/17/2011 8:17 AM

## 2011-07-18 ENCOUNTER — Other Ambulatory Visit: Payer: Self-pay

## 2011-07-18 LAB — CARDIAC PANEL(CRET KIN+CKTOT+MB+TROPI)
CK, MB: 3.5 ng/mL (ref 0.3–4.0)
Relative Index: 1.7 (ref 0.0–2.5)
Troponin I: <0.3 ng/mL (ref <0.30)

## 2011-07-18 LAB — BASIC METABOLIC PANEL
GFR calc Af Amer: 79 mL/min — ABNORMAL LOW (ref >90)
GFR calc non Af Amer: 68 mL/min — ABNORMAL LOW (ref >90)
Glucose, Bld: 111 mg/dL — ABNORMAL HIGH (ref 70–99)
Potassium: 4 mEq/L (ref 3.5–5.1)
Sodium: 138 mEq/L (ref 135–145)

## 2011-07-18 LAB — CBC
Hemoglobin: 13.7 g/dL (ref 13.0–17.0)
MCHC: 33.4 g/dL (ref 30.0–36.0)
RDW: 12.5 % (ref 11.5–15.5)
WBC: 6.8 10*3/uL (ref 4.0–10.5)

## 2011-07-18 LAB — GLUCOSE, CAPILLARY
Glucose-Capillary: 135 mg/dL — ABNORMAL HIGH (ref 70–99)
Glucose-Capillary: 153 mg/dL — ABNORMAL HIGH (ref 70–99)

## 2011-07-18 MED ORDER — ACETAMINOPHEN 325 MG PO TABS: 650.0000 mg | ORAL_TABLET | ORAL | Status: AC | PRN

## 2011-07-18 MED ORDER — GLYBURIDE-METFORMIN 5-500 MG PO TABS: 1.0000 | ORAL_TABLET | Freq: Every day | ORAL | Status: DC

## 2011-07-18 MED ORDER — ASPIRIN 81 MG PO CHEW: 81.0000 mg | CHEWABLE_TABLET | Freq: Every day | ORAL | Status: DC

## 2011-07-18 MED FILL — Dextrose Inj 5%: INTRAVENOUS | Qty: 50 | Status: AC

## 2011-07-18 NOTE — Discharge Summary (Signed)
Patient ID: [name],  MRN: [id number], DOB/AGE: 64-Aug-1947 64 y.o.  Admit date: 07/16/2011 Discharge date: 07/18/2011  Primary Care Provider: Dr [name]. [name] Primary Cardiologist: Dr [name]  Discharge Diagnoses Principal Problem:  *Unstable angina pectoris Active Problems:  CAD ISR OM1 Rx'd with DES this admission  DM (diabetes mellitus)  Dyslipidemia  Stented coronary artery  Statin intolerance    Procedures: PCI 07/17/11  History of Present Illness: This is a 65 y.o. male with a past medical history significant for coronary artery disease status post stent to the first oblique marginal artery performed in January of 2012 (15 x 2.0 mm mini vision stent performed by Dr. Nanetta Lucas). Repeat cardiac catheterization performed in February for recurrence of symptoms showed a widely patent stent. In July the patient again returned with anterior chest "burning" and a nuclear stress that showed evidence of ischemia. Cardiac catheterization confirmed the presence of 90% in-stent restenosis which was treated by Dr. Daphene Lucas with Cutting Balloon angioplasty. The patient developed recurrent angina and is admitted for cardiac cath.   Hospital Course [name] is a 65 year old male who has a history of coronary disease. He had an OM bare-metal stent in January 2012. He was restudied a month later and this was patent. He has no other significant coronary disease. He had recurrent pain in July of 2012 and a Myoview at that time was positive for ischemia. Catheterization confirmed in-stent restenosis at that time which was treated with a cutting balloon angioplasty. The patient presented now with recurrent angina. He was taken to the cath lab for further evaluation. Catheterization showed in-stent restenosis once again. As time he was treated with a drug-eluting stent. We feel he can be discharged 07/18/2011.  Discharge Vitals:  Blood pressure 122/67, pulse 68, temperature 98.4 F (36.9 C), temperature  source Oral, resp. rate 29, height 6' (1.829 m), weight 94.7 kg (208 lb 12.4 oz), SpO2 93.00%.    Labs: Results for orders placed during the hospital encounter of 07/16/11 (from the past 48 hour(s))  CBC     Status: Normal   Collection Time   07/16/11  4:18 PM      Component Value Range Comment   WBC 6.2  4.0 - 10.5 (K/uL)    RBC 4.45  4.22 - 5.81 (MIL/uL)    Hemoglobin 14.5  13.0 - 17.0 (g/dL)    HCT 08.6  57.8 - 46.9 (%)    MCV 94.8  78.0 - 100.0 (fL)    MCH 32.6  26.0 - 34.0 (pg)    MCHC 34.4  30.0 - 36.0 (g/dL)    RDW 62.9  52.8 - 41.3 (%)    Platelets 189  150 - 400 (K/uL)   BASIC METABOLIC PANEL     Status: Abnormal   Collection Time   07/16/11  4:18 PM      Component Value Range Comment   Sodium 139  135 - 145 (mEq/L)    Potassium 4.1  3.5 - 5.1 (mEq/L)    Chloride 105  96 - 112 (mEq/L)    CO2 28  19 - 32 (mEq/L)    Glucose, Bld 102 (*) 70 - 99 (mg/dL)    BUN 11  6 - 23 (mg/dL)    Creatinine, Ser 2.44  0.50 - 1.35 (mg/dL)    Calcium 01.0  8.4 - 10.5 (mg/dL)    GFR calc non Af Amer 85 (*) >90 (mL/min)    GFR calc Af Amer >90  >90 (mL/min)  POCT I-STAT TROPONIN I     Status: Normal   Collection Time   07/16/11  4:44 PM      Component Value Range Comment   Troponin i, poc 0.00  0.00 - 0.08 (ng/mL)    Comment 3            CARDIAC PANEL(CRET KIN+CKTOT+MB+TROPI)     Status: Abnormal   Collection Time   07/16/11  6:41 PM      Component Value Range Comment   Total CK 262 (*) 7 - 232 (U/L)    CK, MB 4.5 (*) 0.3 - 4.0 (ng/mL)    Troponin I <0.30  <0.30 (ng/mL)    Relative Index 1.7  0.0 - 2.5    PROTIME-INR     Status: Normal   Collection Time   07/16/11  6:41 PM      Component Value Range Comment   Prothrombin Time 13.4  11.6 - 15.2 (seconds)    INR 1.00  0.00 - 1.49    CBC     Status: Normal   Collection Time   07/16/11  6:41 PM      Component Value Range Comment   WBC 6.7  4.0 - 10.5 (K/uL)    RBC 4.32  4.22 - 5.81 (MIL/uL)    Hemoglobin 14.3  13.0 - 17.0 (g/dL)      HCT 16.1  09.6 - 52.0 (%)    MCV 94.7  78.0 - 100.0 (fL)    MCH 33.1  26.0 - 34.0 (pg)    MCHC 35.0  30.0 - 36.0 (g/dL)    RDW 04.5  40.9 - 81.1 (%)    Platelets 177  150 - 400 (K/uL)   COMPREHENSIVE METABOLIC PANEL     Status: Abnormal   Collection Time   07/16/11  6:41 PM      Component Value Range Comment   Sodium 139  135 - 145 (mEq/L)    Potassium 4.3  3.5 - 5.1 (mEq/L)    Chloride 105  96 - 112 (mEq/L)    CO2 29  19 - 32 (mEq/L)    Glucose, Bld 100 (*) 70 - 99 (mg/dL)    BUN 10  6 - 23 (mg/dL)    Creatinine, Ser 9.14  0.50 - 1.35 (mg/dL)    Calcium 78.2  8.4 - 10.5 (mg/dL)    Total Protein 7.2  6.0 - 8.3 (g/dL)    Albumin 3.8  3.5 - 5.2 (g/dL)    AST 18  0 - 37 (U/L)    ALT 19  0 - 53 (U/L)    Alkaline Phosphatase 52  39 - 117 (U/L)    Total Bilirubin 0.8  0.3 - 1.2 (mg/dL)    GFR calc non Af Amer 85 (*) >90 (mL/min)    GFR calc Af Amer >90  >90 (mL/min)   HEMOGLOBIN A1C     Status: Abnormal   Collection Time   07/16/11  6:41 PM      Component Value Range Comment   Hemoglobin A1C 6.0 (*) <5.7 (%)    Mean Plasma Glucose 126 (*) <117 (mg/dL)   GLUCOSE, CAPILLARY     Status: Abnormal   Collection Time   07/16/11 11:31 PM      Component Value Range Comment   Glucose-Capillary 175 (*) 70 - 99 (mg/dL)    Comment 1 Notify RN     CARDIAC PANEL(CRET KIN+CKTOT+MB+TROPI)     Status: Abnormal   Collection Time   07/16/11  11:54 PM      Component Value Range Comment   Total CK 313 (*) 7 - 232 (U/L)    CK, MB 4.1 (*) 0.3 - 4.0 (ng/mL)    Troponin I <0.30  <0.30 (ng/mL)    Relative Index 1.3  0.0 - 2.5    CARDIAC PANEL(CRET KIN+CKTOT+MB+TROPI)     Status: Abnormal   Collection Time   07/17/11  5:00 AM      Component Value Range Comment   Total CK 291 (*) 7 - 232 (U/L)    CK, MB 3.8  0.3 - 4.0 (ng/mL)    Troponin I <0.30  <0.30 (ng/mL)    Relative Index 1.3  0.0 - 2.5    LIPID PANEL     Status: Abnormal   Collection Time   07/17/11  5:00 AM      Component Value Range  Comment   Cholesterol 190  0 - 200 (mg/dL)    Triglycerides 161 (*) <150 (mg/dL)    HDL 40  >09 (mg/dL)    Total CHOL/HDL Ratio 4.8      VLDL 59 (*) 0 - 40 (mg/dL)    LDL Cholesterol 91  0 - 99 (mg/dL)   GLUCOSE, CAPILLARY     Status: Abnormal   Collection Time   07/17/11  7:41 AM      Component Value Range Comment   Glucose-Capillary 134 (*) 70 - 99 (mg/dL)   POCT ACTIVATED CLOTTING TIME     Status: Normal   Collection Time   07/17/11 12:20 PM      Component Value Range Comment   Activated Clotting Time 788     GLUCOSE, CAPILLARY     Status: Abnormal   Collection Time   07/17/11  2:04 PM      Component Value Range Comment   Glucose-Capillary 119 (*) 70 - 99 (mg/dL)   GLUCOSE, CAPILLARY     Status: Abnormal   Collection Time   07/17/11  6:41 PM      Component Value Range Comment   Glucose-Capillary 119 (*) 70 - 99 (mg/dL)   CARDIAC PANEL(CRET KIN+CKTOT+MB+TROPI)     Status: Normal   Collection Time   07/17/11  9:18 PM      Component Value Range Comment   Total CK 216  7 - 232 (U/L)    CK, MB 3.4  0.3 - 4.0 (ng/mL)    Troponin I <0.30  <0.30 (ng/mL)    Relative Index 1.6  0.0 - 2.5    GLUCOSE, CAPILLARY     Status: Abnormal   Collection Time   07/17/11  9:18 PM      Component Value Range Comment   Glucose-Capillary 162 (*) 70 - 99 (mg/dL)    Comment 1 Notify RN      Comment 2 Documented in Chart     CARDIAC PANEL(CRET KIN+CKTOT+MB+TROPI)     Status: Normal   Collection Time   07/18/11  3:54 AM      Component Value Range Comment   Total CK 198  7 - 232 (U/L)    CK, MB 3.5  0.3 - 4.0 (ng/mL)    Troponin I <0.30  <0.30 (ng/mL)    Relative Index 1.8  0.0 - 2.5    CBC     Status: Normal   Collection Time   07/18/11  3:54 AM      Component Value Range Comment   WBC 6.8  4.0 - 10.5 (K/uL)    RBC  4.29  4.22 - 5.81 (MIL/uL)    Hemoglobin 13.7  13.0 - 17.0 (g/dL)    HCT 16.1  09.6 - 04.5 (%)    MCV 95.6  78.0 - 100.0 (fL)    MCH 31.9  26.0 - 34.0 (pg)    MCHC 33.4  30.0 -  36.0 (g/dL)    RDW 40.9  81.1 - 91.4 (%)    Platelets 169  150 - 400 (K/uL)   BASIC METABOLIC PANEL     Status: Abnormal   Collection Time   07/18/11  3:54 AM      Component Value Range Comment   Sodium 138  135 - 145 (mEq/L)    Potassium 4.0  3.5 - 5.1 (mEq/L)    Chloride 104  96 - 112 (mEq/L)    CO2 28  19 - 32 (mEq/L)    Glucose, Bld 111 (*) 70 - 99 (mg/dL)    BUN 12  6 - 23 (mg/dL)    Creatinine, Ser 7.82  0.50 - 1.35 (mg/dL)    Calcium 9.4  8.4 - 10.5 (mg/dL)    GFR calc non Af Amer 68 (*) >90 (mL/min)    GFR calc Af Amer 79 (*) >90 (mL/min)   GLUCOSE, CAPILLARY     Status: Abnormal   Collection Time   07/18/11  8:12 AM      Component Value Range Comment   Glucose-Capillary 135 (*) 70 - 99 (mg/dL)   GLUCOSE, CAPILLARY     Status: Abnormal   Collection Time   07/18/11 12:58 PM      Component Value Range Comment   Glucose-Capillary 153 (*) 70 - 99 (mg/dL)   CARDIAC PANEL(CRET KIN+CKTOT+MB+TROPI)     Status: Normal   Collection Time   07/18/11  1:09 PM      Component Value Range Comment   Total CK 201  7 - 232 (U/L)    CK, MB 3.4  0.3 - 4.0 (ng/mL)    Troponin I <0.30  <0.30 (ng/mL)    Relative Index 1.7  0.0 - 2.5      Disposition:  Follow-up Information    Follow up with [name],GERALD K .         Discharge Medications:  Current Discharge Medication List    START taking these medications   Details  acetaminophen (TYLENOL) 325 MG tablet Take 2 tablets (650 mg total) by mouth every 4 (four) hours as needed. Qty: 30 tablet    aspirin 81 MG chewable tablet Chew 1 tablet (81 mg total) by mouth daily.      CONTINUE these medications which have CHANGED   Details  glyBURIDE-metformin (GLUCOVANCE) 5-500 MG per tablet Take 1 tablet by mouth daily with breakfast.      CONTINUE these medications which have NOT CHANGED   Details  clopidogrel (PLAVIX) 75 MG tablet Take 75 mg by mouth daily.      dexlansoprazole (DEXILANT) 60 MG capsule Take 60 mg by mouth daily.       losartan (COZAAR) 25 MG tablet Take 25 mg by mouth daily.      metoprolol succinate (TOPROL-XL) 25 MG 24 hr tablet Take 25 mg by mouth daily.      nitroGLYCERIN (NITROSTAT) 0.4 MG SL tablet Place 0.4 mg under the tongue every 5 (five) minutes as needed. For chest pain     Pitavastatin Calcium (LIVALO) 2 MG TABS Take 1 tablet by mouth 2 (two) times a week. Mondays and Thursdays  ramipril (ALTACE) 5 MG capsule Take 5 mg by mouth daily.        STOP taking these medications     aspirin EC 325 MG tablet         Outstanding Labs/Studies: none  Duration of Discharge Encounter: Greater than 30 minutes including physician time.  Jolene Provost PA-C 07/18/2011 3:02 PM  I saw [name] and performed his catheterization and PCI.  He did have some residual chest pain shortly after the procedure, but has not had positive biomarkers or ECG changes.  The pain was mild and relieved with NTG & morphine.  He was seen this AM by Dr. Rennis Golden, who felt that he was stable for discharge as he was pain free at rest and with ambulation.  The post PCI pain was most likely due to coronary artery stretch related spasm after aggressive dilation of the stent and not stent thrombosis. Per Dr. Blanchie Dessert assessment, he is stable for discharge to home with planned follow-up with Dr. Allyson Lucas.  Marykay Lex, M.D., M.S. [facility] [address] [place], [place]  [address]  [phone]  07/18/2011 3:52 PM

## 2011-07-18 NOTE — Progress Notes (Signed)
Pt. Seen and examined. Agree with the NP/PA-C note as written.  Ok for d/c today. Keep on Plavix. Has a DES now. Follow-up in the office in a week or so.  Chrystie Nose, MD Attending Cardiologist [facility]

## 2011-07-18 NOTE — Progress Notes (Signed)
Subjective:  No chest pain.  Objective:  Vital Signs in the last 24 hours: Temp:  [97.7 F (36.5 C)-98.2 F (36.8 C)] 98.2 F (36.8 C) (12/07 0700) Pulse Rate:  [53-69] 59  (12/07 0700) Resp:  [10-21] 20  (12/07 0700) BP: (102-122)/(50-67) 122/67 mmHg (12/07 0700) SpO2:  [94 %-99 %] 99 % (12/07 0700) Weight:  [94.7 kg (208 lb 12.4 oz)] 208 lb 12.4 oz (94.7 kg) (12/07 0600)  Intake/Output from previous day:  Intake/Output Summary (Last 24 hours) at 07/18/11 1134 Last data filed at 07/18/11 0700  Gross per 24 hour  Intake    500 ml  Output    725 ml  Net   -225 ml    Physical Exam: General appearance: alert, cooperative and no distress Lungs: clear to auscultation bilaterally Heart: regular rate and rhythm, S1, S2 normal, no murmur, click, rub or gallop Rt wrist without hematoma   Rate: 60  Rhythm: normal sinus rhythm  Lab Results:  Basename 07/18/11 0354 07/16/11 1841  WBC 6.8 6.7  HGB 13.7 14.3  PLT 169 177    Basename 07/18/11 0354 07/16/11 1841  NA 138 139  K 4.0 4.3  CL 104 105  CO2 28 29  GLUCOSE 111* 100*  BUN 12 10  CREATININE 1.12 0.99    Basename 07/18/11 0354 07/17/11 2118  TROPONINI <0.30 <0.30   Hepatic Function Panel  Basename 07/16/11 1841  PROT 7.2  ALBUMIN 3.8  AST 18  ALT 19  ALKPHOS 52  BILITOT 0.8  BILIDIR --  IBILI --    Basename 07/17/11 0500  CHOL 190    Basename 07/16/11 1841  INR 1.00    Imaging: Dg Chest 2 View  07/16/2011  *RADIOLOGY REPORT*  Clinical Data: Chest pain.  CHEST - 2 VIEW  Comparison: Chest x-ray 02/07/2011.  Findings: The cardiac silhouette, mediastinal and hilar contours are within normal limits and stable.  There are chronic bronchitic type lung changes but no acute overlying pulmonary process.  No pleural effusion.  The bony thorax is intact.  IMPRESSION: Chronic lung changes but no acute overlying pulmonary process.  Original Report Authenticated By: P. Loralie Champagne, M.D.    Cardiac  Studies:  Assessment/Plan:   Principal Problem:  *Unstable angina pectoris  Active Problems:  CAD, ISR RCA BMS Rx'd with DES this adm  DM (diabetes mellitus)  Dyslipidemia, statin intol  Stented coronary artery, RCA BMS [id number], ISR July 2012 Rx'd with HSRA  PVD, Lt SFA PTA 2006   Plan- D/C today. ? Change Plavix to Brylenta or Effient, will discuss with MD.   Corine Shelter PA-C 07/18/2011, 11:34 AM

## 2011-07-18 NOTE — Progress Notes (Signed)
Pt placed on 2L O2 for desaturations into the 80's while sleeping.  Will continue to assess.  Pt sats are fine when awake.

## 2011-07-18 NOTE — Progress Notes (Signed)
CARDIAC REHAB PHASE I   PRE:  Rate/Rhythm: 58 SB  BP:  Supine: 122/67  Sitting:   Standing:    SaO2: 99 2L  MODE:  Ambulation: 680 ft   POST:  Rate/Rhythem: 76 SR  BP:  Supine:   Sitting: 149/69  Standing:    SaO2: 99 RA 0750-0840 Tolerated ambulation well without c/o of cp or SOB. Completed discharge education with pt. He declines Outpt. CRP not interested he has done it in the past.  Beatrix Fetters

## 2011-07-29 ENCOUNTER — Emergency Department (HOSPITAL_COMMUNITY): Payer: 59

## 2011-07-29 ENCOUNTER — Encounter (HOSPITAL_COMMUNITY): Payer: Self-pay | Admitting: *Deleted

## 2011-07-29 ENCOUNTER — Other Ambulatory Visit: Payer: Self-pay

## 2011-07-29 ENCOUNTER — Emergency Department (HOSPITAL_COMMUNITY)
Admission: EM | Admit: 2011-07-29 | Discharge: 2011-07-30 | Disposition: A | Discharge status: Home / Self Care | Payer: 59 | Attending: Emergency Medicine | Admitting: Emergency Medicine

## 2011-07-29 DIAGNOSIS — E119 Type 2 diabetes mellitus without complications: Secondary | ICD-10-CM | POA: Insufficient documentation

## 2011-07-29 DIAGNOSIS — Z87891 Personal history of nicotine dependence: Secondary | ICD-10-CM | POA: Insufficient documentation

## 2011-07-29 DIAGNOSIS — I509 Heart failure, unspecified: Secondary | ICD-10-CM | POA: Insufficient documentation

## 2011-07-29 DIAGNOSIS — I2510 Atherosclerotic heart disease of native coronary artery without angina pectoris: Secondary | ICD-10-CM | POA: Insufficient documentation

## 2011-07-29 DIAGNOSIS — I10 Essential (primary) hypertension: Secondary | ICD-10-CM | POA: Insufficient documentation

## 2011-07-29 DIAGNOSIS — I498 Other specified cardiac arrhythmias: Secondary | ICD-10-CM | POA: Insufficient documentation

## 2011-07-29 DIAGNOSIS — R079 Chest pain, unspecified: Secondary | ICD-10-CM | POA: Insufficient documentation

## 2011-07-29 DIAGNOSIS — I440 Atrioventricular block, first degree: Secondary | ICD-10-CM | POA: Insufficient documentation

## 2011-07-29 DIAGNOSIS — M542 Cervicalgia: Secondary | ICD-10-CM | POA: Insufficient documentation

## 2011-07-29 LAB — TROPONIN I: Troponin I: <0.3 ng/mL (ref <0.30)

## 2011-07-29 LAB — BASIC METABOLIC PANEL
CO2: 34 mEq/L — ABNORMAL HIGH (ref 19–32)
Calcium: 10.1 mg/dL (ref 8.4–10.5)
Potassium: 3.6 mEq/L (ref 3.5–5.1)
Sodium: 142 mEq/L (ref 135–145)

## 2011-07-29 LAB — POCT I-STAT TROPONIN I: Troponin i, poc: 0 ng/mL (ref 0.00–0.08)

## 2011-07-29 LAB — DIFFERENTIAL
Basophils Absolute: 0 10*3/uL (ref 0.0–0.1)
Eosinophils Relative: 0 % (ref 0–5)
Lymphocytes Relative: 46 % (ref 12–46)
Neutro Abs: 2.3 10*3/uL (ref 1.7–7.7)
Neutrophils Relative %: 46 % (ref 43–77)

## 2011-07-29 LAB — CBC
MCV: 95.5 fL (ref 78.0–100.0)
Platelets: 172 10*3/uL (ref 150–400)
RBC: 3.99 MIL/uL — ABNORMAL LOW (ref 4.22–5.81)
RDW: 12.1 % (ref 11.5–15.5)
WBC: 5 10*3/uL (ref 4.0–10.5)

## 2011-07-29 MED ORDER — MORPHINE SULFATE 2 MG/ML IJ SOLN
2.0000 mg | Freq: Once | INTRAMUSCULAR | Status: AC
  Administered 2011-07-29: 2 mg via INTRAVENOUS
  Filled 2011-07-29: qty 1

## 2011-07-29 MED ORDER — ONDANSETRON HCL 4 MG/2ML IJ SOLN
4.0000 mg | Freq: Once | INTRAMUSCULAR | Status: AC
  Administered 2011-07-29: 4 mg via INTRAVENOUS
  Filled 2011-07-29: qty 2

## 2011-07-29 MED ORDER — ASPIRIN 81 MG PO CHEW
324.0000 mg | CHEWABLE_TABLET | Freq: Once | ORAL | Status: AC
  Administered 2011-07-29: 324 mg via ORAL
  Filled 2011-07-29: qty 4

## 2011-07-29 MED ORDER — SODIUM CHLORIDE 0.9 % IV SOLN
Freq: Once | INTRAVENOUS | Status: AC
  Administered 2011-07-29: 22:00:00 via INTRAVENOUS

## 2011-07-29 NOTE — ED Provider Notes (Signed)
History   Scribed for [employer]. Colon Branch, MD, the patient was seen in room APA19/APA19 . This chart was scribed by Lewanda Rife.   CSN: [id number] Arrival date & time: 07/29/2011  8:11 PM   First MD Initiated Contact with Patient 07/29/11 2048      Chief Complaint  Patient presents with  . Chest Pain    (Consider location/radiation/quality/duration/timing/severity/associated sxs/prior treatment) HPI [name] is a 65 y.o. male with a hx of CAD, diabetes, hypertension and CHF who presents to the Emergency Department complaining of a burning and aching chest pain. Pt describes the pain as waxing and waning. The chest pain began at rest, unlike previous episodes where the pain began with exertion.The onset was acute and began after walking and eating while pt was at rest. Pt reports pain in the right side of neck at rest and no pain there when active. Pt reports taking an 3 nitroglycerin today with little relief. Pt rates the pain at a 4-5 on arrival and has improved currently at rest.  Pt denies associated shortness of breath. Pt had  BMS placed in OMI  in January 2012 with restenosis and intervention in July 2012 and subsequent DES placed on December 7th 2012.   Cardio Gery Pray  PCP [place]   Past Medical History  Diagnosis Date  . Coronary artery disease   . Diabetes mellitus   . Hypertension   . CHF (congestive heart failure)     Past Surgical History  Procedure Date  . Coronary stent placement   . Femoral artery stent     History reviewed. No pertinent family history.  History  Substance Use Topics  . Smoking status: Former Games developer  . Smokeless tobacco: Not on file  . Alcohol Use: No      Review of Systems  HENT: Positive for neck pain (right neck pain).   Respiratory: Negative for shortness of breath.   Cardiovascular: Positive for chest pain (aching chest pain ).  All other systems reviewed and are negative.    Allergies  Levaquin; Penicillins;  Propoxyphene n-acetaminophen; Statins; and Tape  Home Medications   Current Outpatient Rx  Name Route Sig Dispense Refill  . ACETAMINOPHEN 325 MG PO TABS Oral Take 2 tablets (650 mg total) by mouth every 4 (four) hours as needed. 30 tablet   . ASPIRIN 81 MG PO CHEW Oral Chew 1 tablet (81 mg total) by mouth daily.    Marland Kitchen CLOPIDOGREL BISULFATE 75 MG PO TABS Oral Take 75 mg by mouth daily.      . DEXLANSOPRAZOLE 60 MG PO CPDR Oral Take 60 mg by mouth daily.      . GLYBURIDE-METFORMIN 5-500 MG PO TABS Oral Take 1 tablet by mouth daily with breakfast.      Don't resume till 12/9  . LOSARTAN POTASSIUM 25 MG PO TABS Oral Take 25 mg by mouth daily.      Marland Kitchen METOPROLOL SUCCINATE ER 25 MG PO TB24 Oral Take 25 mg by mouth daily.      Marland Kitchen NITROGLYCERIN 0.4 MG SL SUBL Sublingual Place 0.4 mg under the tongue every 5 (five) minutes as needed. For chest pain     . PITAVASTATIN CALCIUM 2 MG PO TABS Oral Take 1 tablet by mouth 2 (two) times a week. Mondays and Thursdays     . RAMIPRIL 5 MG PO CAPS Oral Take 5 mg by mouth daily.        BP 123/65  Pulse 60  Temp(Src) 97.9 F (  36.6 C) (Oral)  Resp 20  Ht 6' (1.829 m)  Wt 210 lb (95.255 kg)  BMI 28.48 kg/m2  SpO2 99%  Physical Exam  Constitutional: He is oriented to person, place, and time. He appears well-developed and well-nourished.  HENT:  Head: Normocephalic and atraumatic.  Cardiovascular: Normal rate and regular rhythm.  Exam reveals no friction rub.   No murmur heard. Pulmonary/Chest: Effort normal and breath sounds normal.  Neurological: He is alert and oriented to person, place, and time.  Skin: Skin is warm and dry.  Psychiatric: He has a normal mood and affect. His behavior is normal.    ED Course  Procedures (including critical care time)  Oxygen saturation is 99% on room air, normal by my interpretation.   Results for orders placed during the hospital encounter of 07/29/11  CBC      Component Value Range   WBC 5.0  4.0 - 10.5  (K/uL)   RBC 3.99 (*) 4.22 - 5.81 (MIL/uL)   Hemoglobin 13.2  13.0 - 17.0 (g/dL)   HCT 45.4 (*) 09.8 - 52.0 (%)   MCV 95.5  78.0 - 100.0 (fL)   MCH 33.1  26.0 - 34.0 (pg)   MCHC 34.6  30.0 - 36.0 (g/dL)   RDW 11.9  14.7 - 82.9 (%)   Platelets 172  150 - 400 (K/uL)  DIFFERENTIAL      Component Value Range   Neutrophils Relative 46  43 - 77 (%)   Neutro Abs 2.3  1.7 - 7.7 (K/uL)   Lymphocytes Relative 46  12 - 46 (%)   Lymphs Abs 2.3  0.7 - 4.0 (K/uL)   Monocytes Relative 7  3 - 12 (%)   Monocytes Absolute 0.4  0.1 - 1.0 (K/uL)   Eosinophils Relative 0  0 - 5 (%)   Eosinophils Absolute 0.0  0.0 - 0.7 (K/uL)   Basophils Relative 0  0 - 1 (%)   Basophils Absolute 0.0  0.0 - 0.1 (K/uL)  BASIC METABOLIC PANEL      Component Value Range   Sodium 142  135 - 145 (mEq/L)   Potassium 3.6  3.5 - 5.1 (mEq/L)   Chloride 102  96 - 112 (mEq/L)   CO2 34 (*) 19 - 32 (mEq/L)   Glucose, Bld 190 (*) 70 - 99 (mg/dL)   BUN 12  6 - 23 (mg/dL)   Creatinine, Ser 5.62  0.50 - 1.35 (mg/dL)   Calcium 13.0  8.4 - 10.5 (mg/dL)   GFR calc non Af Amer 67 (*) >90 (mL/min)   GFR calc Af Amer 77 (*) >90 (mL/min)  TROPONIN I      Component Value Range   Troponin I <0.30  <0.30 (ng/mL)  POCT I-STAT TROPONIN I      Component Value Range   Troponin i, poc 0.00  0.00 - 0.08 (ng/mL)   Comment 3             Dg Chest Portable 1 View  07/29/2011  *RADIOLOGY REPORT*  Clinical Data: Left-sided chest pain  PORTABLE CHEST - 1 VIEW  Comparison: 07/16/2011  Findings: Heart size upper normal limits to mildly enlarged.  Mild interstitial prominence at the bases is similar to prior.  No focal consolidation.  No pleural effusion or pneumothorax. Aortic arch atherosclerotic calcification.  No acute osseous abnormality. Multilevel degenerative changes.  IMPRESSION: No focal consolidation.  Original Report Authenticated By: Waneta Martins, M.D.    Date: 07/30/2011  2017  Rate:59  Rhythm: sinus bradycardia  QRS Axis:  normal  Intervals: normal  ST/T Wave abnormalities: normal  Conduction Disutrbances:none  Narrative Interpretation: septal infarct, age undetermined  Old EKG Reviewed: unchanged  EKG #2  Date: 07/30/2011  0034  Rate:54  Rhythm: sinus bradycardia  QRS Axis: normal  Intervals: normal  ST/T Wave abnormalities: normal  Conduction Disutrbances:first-degree A-V block   Narrative Interpretation:   Old EKG Reviewed: changes noted c/w EKG #1 1st degree heart block now present    No diagnosis found.    MDM  Patient with recent DES placement 07/18/11 here with chest pain. Chest pain at rest, not with exertion. Did not respond to home NTG x 3. Pain free after morphine 2 mg. Troponin x 2 negative. No significant EKG changes. Spoke with Karleen Hampshire, [place] [facility] cardiology. Patient has a follow up appointment with Dr. Allyson Sabal 08/14/2011. He asked that third troponin be obtained. If negative, patient to call for appointment this week or next with Dr. Allyson Sabal. If troponin is positive, admit for rule out with SE consult tomorrow. [id number] Sign out to Dr. Lynelle Doctor.    I personally performed the services described in this documentation, which was scribed in my presence. The recorded information has been reviewed and considered.   MDM Reviewed: previous chart, nursing note and vitals Reviewed previous: labs, ECG and x-ray Interpretation: labs, ECG and x-ray Total time providing critical care: 30. Consults: cardiology      Nicoletta Dress. Colon Branch, MD 07/30/11 [phone]

## 2011-07-29 NOTE — ED Notes (Signed)
CP started 1945 Pt has had a new stent placement on 07/18/2011

## 2011-07-30 ENCOUNTER — Other Ambulatory Visit: Payer: Self-pay

## 2011-07-30 LAB — POCT I-STAT TROPONIN I

## 2011-07-30 MED ORDER — ISOSORBIDE MONONITRATE ER 30 MG PO TB24: 30.0000 mg | ORAL_TABLET | Freq: Every day | ORAL | Status: DC

## 2011-07-30 NOTE — ED Provider Notes (Signed)
Pt signed out to me pending third set of troponin.  If negative pt is to go home and follow up with his cardiologist.  Dr Colon Branch discussed the case with the cardiologist on call.   Physical Exam  BP 109/63  Pulse 63  Temp(Src) 97.9 F (36.6 C) (Oral)  Resp 11  Ht 6' (1.829 m)  Wt 210 lb (95.255 kg)  BMI 28.48 kg/m2  SpO2 94%  Physical Exam  ED Course  Procedures  MDM The repeat troponin test was normal. The patient will followup with his cardiologist. Instructed him to call later this morning to schedule a close followup appointment.      Celene Kras, MD 07/30/11 [phone]

## 2011-08-19 ENCOUNTER — Encounter: Payer: Self-pay | Admitting: Internal Medicine

## 2011-08-19 ENCOUNTER — Telehealth: Payer: Self-pay

## 2011-08-19 NOTE — Telephone Encounter (Signed)
As discussed with Ginger. Leave appt for Wednesday.

## 2011-08-19 NOTE — Telephone Encounter (Signed)
Pt had an appointment with you Wednesday at 3:30 but we needed to change it but the first appointment we have is on the 15 and he stated that he can not wait that long. He has reflux bad and was to talk to someone about it. Can we use your urgent slot Wednesday at 11:30?

## 2011-08-20 ENCOUNTER — Ambulatory Visit (INDEPENDENT_AMBULATORY_CARE_PROVIDER_SITE_OTHER): Payer: 59 | Admitting: Gastroenterology

## 2011-08-20 ENCOUNTER — Encounter: Payer: Self-pay | Admitting: Gastroenterology

## 2011-08-20 VITALS — BP 114/60 | HR 62 | Temp 97.4°F | Ht 72.0 in | Wt 213.2 lb

## 2011-08-20 DIAGNOSIS — R1011 Right upper quadrant pain: Secondary | ICD-10-CM

## 2011-08-20 DIAGNOSIS — K219 Gastro-esophageal reflux disease without esophagitis: Secondary | ICD-10-CM

## 2011-08-20 DIAGNOSIS — K5900 Constipation, unspecified: Secondary | ICD-10-CM

## 2011-08-20 MED ORDER — POLYETHYLENE GLYCOL 3350 17 G PO PACK: PACK | ORAL | Status: DC

## 2011-08-20 MED ORDER — DEXLANSOPRAZOLE 60 MG PO CPDR: 60.0000 mg | DELAYED_RELEASE_CAPSULE | Freq: Two times a day (BID) | ORAL | Status: DC

## 2011-08-20 NOTE — Progress Notes (Signed)
Primary Care Physician: Evlyn Courier, MD, MD  Primary Gastroenterologist:  Roetta Sessions, MD   Chief Complaint  Patient presents with  . Gastrophageal Reflux    HPI: [name] is a 66 y.o. male here for further evaluation of ?GERD. H/O chronic GERD had been maintained on Dexilant since EGD in 02/2010 for dysphagia. 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 Previously failed pantoprazole, aciphex, prevacid 24 hour.   Three cardiac caths last year for unstable angina.  The last one showed --> Impression: 1.  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 2.  Successful PCI of prox OM1 lesion with Promus DES 2.25 mm x 20 mm (post dilated to 2.38 mm). 3.  Preserved LV Function. EF ~55-60%.  ED 2-3 weeks ago for chest pain, ruled out for MI.    PP knife-like pain under right rib cage into back. Also with chest pain when turns neck to right. Some burning quality. Worse with meals. Morning thick mucous seemed to irritate stomach. Mucinex helping. No recent antibiotics. No dysphagia, diaphoresis, sob. Walk about two hours per day.   Pain worse with meals but sometimes better when eats initially if has fasted for awhile. Motrin for HA about 2-3 times per week.  Tried gas-x. Constipation recent. BM qod. No melena, brbpr. Abdominal pain somewhat better if has BM.    Current Outpatient Prescriptions  Medication Sig Dispense Refill  . aspirin EC 81 MG tablet Take 81 mg by mouth at bedtime.        . clopidogrel (PLAVIX) 75 MG tablet Take 75 mg by mouth daily.        . Coenzyme Q10 (CO Q 10 PO) Take 1 capsule by mouth daily.        . Cyanocobalamin (B-12 PO) Take 1 tablet by mouth daily.        Marland Kitchen dexlansoprazole (DEXILANT) 60 MG capsule Take 60 mg by mouth daily.        . fexofenadine (ALLEGRA) 180 MG tablet Take 180 mg by mouth daily.        Marland Kitchen glyBURIDE-metformin (GLUCOVANCE) 5-500 MG per  tablet Take 1 tablet by mouth every evening.        Marland Kitchen ibuprofen (ADVIL,MOTRIN) 200 MG tablet Take 400 mg by mouth daily as needed. For pain       . isosorbide mononitrate (IMDUR) 30 MG 24 hr tablet Take 1 tablet (30 mg total) by mouth daily.  30 tablet  0  . metoprolol succinate (TOPROL-XL) 25 MG 24 hr tablet Take 25 mg by mouth at bedtime.       . nitroGLYCERIN (NITROSTAT) 0.4 MG SL tablet Place 0.4 mg under the tongue every 5 (five) minutes as needed. For chest pain       . Pitavastatin Calcium (LIVALO) 2 MG TABS Take 1 tablet by mouth 2 (two) times a week. Mondays and Thursdays       . Pyridoxine HCl (B-6 PO) Take 1 tablet by mouth daily.        . ramipril (ALTACE) 5 MG capsule Take 5 mg by mouth daily.          Allergies as of 08/20/2011 - Review Complete 08/20/2011  Allergen Reaction Noted  . Levaquin (levofloxacin hemihydrate)  07/29/2011  . Penicillins    . Propoxyphene n-acetaminophen    . Statins  07/29/2011  . Tape  07/29/2011    ROS:  General: Negative for anorexia, weight  loss, fever, chills, fatigue, weakness. ENT: Negative for hoarseness, difficulty swallowing , nasal congestion. CV: See HPI. Negative for palpitations, dyspnea on exertion, peripheral edema.  Respiratory: Negative for dyspnea at rest, dyspnea on exertion, cough, sputum, wheezing.  GI: See history of present illness. GU:  Negative for dysuria, hematuria, urinary incontinence, urinary frequency, nocturnal urination.  Endo: Negative for unusual weight change.    Physical Examination:   BP 114/60  Pulse 62  Temp(Src) 97.4 F (36.3 C) (Temporal)  Ht 6' (1.829 m)  Wt 213 lb 3.2 oz (96.707 kg)  BMI 28.92 kg/m2  General: Well-nourished, well-developed in no acute distress.  Eyes: No icterus. Mouth: Oropharyngeal mucosa moist and pink , no lesions erythema or exudate. Lungs: Clear to auscultation bilaterally.  Heart: Regular rate and rhythm, no murmurs rubs or gallops.  Abdomen: Bowel sounds are  normal, nontender, nondistended, no hepatosplenomegaly or masses, no abdominal bruits or hernia , no rebound or guarding.   Extremities: No lower extremity edema. No clubbing or deformities. Neuro: Alert and oriented x 4   Skin: Warm and dry, no jaundice.   Psych: Alert and cooperative, normal mood and affect.  Labs:  Lab Results  Component Value Date   WBC 5.0 07/29/2011   HGB 13.2 07/29/2011   HCT 38.1* 07/29/2011   MCV 95.5 07/29/2011   PLT 172 07/29/2011   Lab Results  Component Value Date   ALT 19 07/16/2011   AST 18 07/16/2011   ALKPHOS 52 07/16/2011   BILITOT 0.8 07/16/2011   Lab Results  Component Value Date   CREATININE 1.14 07/29/2011   BUN 12 07/29/2011   NA 142 07/29/2011   K 3.6 07/29/2011   CL 102 07/29/2011   CO2 34* 07/29/2011   No results found for this basename: LIPASE    Imaging Studies: Dg Chest Portable 1 View  07/29/2011  *RADIOLOGY REPORT*  Clinical Data: Left-sided chest pain  PORTABLE CHEST - 1 VIEW  Comparison: 07/16/2011  Findings: Heart size upper normal limits to mildly enlarged.  Mild interstitial prominence at the bases is similar to prior.  No focal consolidation.  No pleural effusion or pneumothorax. Aortic arch atherosclerotic calcification.  No acute osseous abnormality. Multilevel degenerative changes.  IMPRESSION: No focal consolidation.  Original Report Authenticated By: Waneta Martins, M.D.

## 2011-08-20 NOTE — Patient Instructions (Signed)
We have scheduled you an abdominal ultrasound to rule out gallbladder disease.  Please increase Dexilant to 30 minutes before breakfast and 30 minutes before evening meal.  I chose not to start Nexium as it may decrease the ability of Plavix to thin your blood.   Diet for GERD or PUD Nutrition therapy can help ease the discomfort of gastroesophageal reflux disease (GERD) and peptic ulcer disease (PUD).  HOME CARE INSTRUCTIONS   Eat your meals slowly, in a relaxed setting.   Eat 5 to 6 small meals per day.   If a food causes distress, stop eating it for a period of time.  FOODS TO AVOID  Coffee, regular or decaffeinated.   Cola beverages, regular or low calorie.   Tea, regular or decaffeinated.   Pepper.   Cocoa.   High fat foods, including meats.   Butter, margarine, hydrogenated oil (trans fats).   Peppermint or spearmint (if you have GERD).   Fruits and vegetables if not tolerated.   Alcohol.   Nicotine (smoking or chewing). This is one of the most potent stimulants to acid production in the gastrointestinal tract.   Any food that seems to aggravate your condition.  If you have questions regarding your diet, ask your caregiver or a registered dietitian. TIPS  Lying flat may make symptoms worse. Keep the head of your bed raised 6 to 9 inches (15 to 23 cm) by using a foam wedge or blocks under the legs of the bed.   Do not lay down until 3 hours after eating a meal.   Daily physical activity may help reduce symptoms.  MAKE SURE YOU:   Understand these instructions.   Will watch your condition.   Will get help right away if you are not doing well or get worse.  Document Released: 07/28/2005 Document Revised: 04/09/2011 Document Reviewed: 12/11/2008 [facility] Patient Information 2012 [place], [place].

## 2011-08-20 NOTE — Assessment & Plan Note (Signed)
Recent constipation. Start Miralax 17g bid for three days then daily prn.

## 2011-08-20 NOTE — Assessment & Plan Note (Addendum)
Recent ruq pain radiating into back and chest. Some burning epigastric pain. Increase Dexilant to bid. Check abd u/s to rule out gallbladder disease. Antireflux measures discuss, handout provided.

## 2011-08-21 NOTE — Progress Notes (Signed)
Cc to PCP 

## 2011-08-25 ENCOUNTER — Ambulatory Visit (HOSPITAL_COMMUNITY)
Admission: RE | Admit: 2011-08-25 | Discharge: 2011-08-25 | Disposition: A | Discharge status: Home / Self Care | Payer: 59 | Source: Ambulatory Visit | Attending: Gastroenterology | Admitting: Gastroenterology

## 2011-08-25 DIAGNOSIS — R1011 Right upper quadrant pain: Secondary | ICD-10-CM | POA: Insufficient documentation

## 2011-08-28 NOTE — Progress Notes (Signed)
Quick Note:  No gallstones. If still with right rib cage pain, chest pain, upper abd pain after been on Dexilant bid for two weeks, then consider HIDA with fatty meal challenge to complete gb work-up. Patient needs to let us know how he is doing in next 1-2 weeks. ______

## 2011-09-02 NOTE — Progress Notes (Signed)
Quick Note:  Tried to call pt- left message with wife for return call ______

## 2011-09-03 NOTE — Progress Notes (Signed)
Quick Note:  Pt aware, said he is feeling better now and will call back and let us know how he is doing. ______

## 2012-04-29 ENCOUNTER — Other Ambulatory Visit: Payer: Self-pay | Admitting: Family Medicine

## 2012-04-29 DIAGNOSIS — R079 Chest pain, unspecified: Secondary | ICD-10-CM

## 2012-05-03 ENCOUNTER — Other Ambulatory Visit: Payer: Self-pay | Admitting: Family Medicine

## 2012-05-03 DIAGNOSIS — R079 Chest pain, unspecified: Secondary | ICD-10-CM

## 2012-05-04 ENCOUNTER — Ambulatory Visit
Admission: RE | Admit: 2012-05-04 | Discharge: 2012-05-04 | Disposition: A | Discharge status: Home / Self Care | Payer: 59 | Source: Ambulatory Visit | Attending: Family Medicine | Admitting: Family Medicine

## 2012-05-04 DIAGNOSIS — R079 Chest pain, unspecified: Secondary | ICD-10-CM

## 2012-05-04 MED ORDER — IOHEXOL 350 MG/ML SOLN
125.0000 mL | Freq: Once | INTRAVENOUS | Status: AC | PRN
  Administered 2012-05-04: 125 mL via INTRAVENOUS

## 2012-05-05 ENCOUNTER — Other Ambulatory Visit: Payer: 59

## 2012-07-10 ENCOUNTER — Emergency Department (HOSPITAL_COMMUNITY)
Admission: EM | Admit: 2012-07-10 | Discharge: 2012-07-10 | Disposition: A | Discharge status: Home / Self Care | Payer: 59 | Attending: Emergency Medicine | Admitting: Emergency Medicine

## 2012-07-10 ENCOUNTER — Encounter (HOSPITAL_COMMUNITY): Payer: Self-pay

## 2012-07-10 DIAGNOSIS — Z79899 Other long term (current) drug therapy: Secondary | ICD-10-CM | POA: Insufficient documentation

## 2012-07-10 DIAGNOSIS — Z9861 Coronary angioplasty status: Secondary | ICD-10-CM | POA: Insufficient documentation

## 2012-07-10 DIAGNOSIS — Z7901 Long term (current) use of anticoagulants: Secondary | ICD-10-CM | POA: Insufficient documentation

## 2012-07-10 DIAGNOSIS — I10 Essential (primary) hypertension: Secondary | ICD-10-CM | POA: Insufficient documentation

## 2012-07-10 DIAGNOSIS — K219 Gastro-esophageal reflux disease without esophagitis: Secondary | ICD-10-CM | POA: Insufficient documentation

## 2012-07-10 DIAGNOSIS — Z7982 Long term (current) use of aspirin: Secondary | ICD-10-CM | POA: Insufficient documentation

## 2012-07-10 DIAGNOSIS — I739 Peripheral vascular disease, unspecified: Secondary | ICD-10-CM | POA: Insufficient documentation

## 2012-07-10 DIAGNOSIS — I2510 Atherosclerotic heart disease of native coronary artery without angina pectoris: Secondary | ICD-10-CM | POA: Insufficient documentation

## 2012-07-10 DIAGNOSIS — J029 Acute pharyngitis, unspecified: Secondary | ICD-10-CM | POA: Insufficient documentation

## 2012-07-10 DIAGNOSIS — I509 Heart failure, unspecified: Secondary | ICD-10-CM | POA: Insufficient documentation

## 2012-07-10 DIAGNOSIS — E785 Hyperlipidemia, unspecified: Secondary | ICD-10-CM | POA: Insufficient documentation

## 2012-07-10 DIAGNOSIS — E119 Type 2 diabetes mellitus without complications: Secondary | ICD-10-CM | POA: Insufficient documentation

## 2012-07-10 DIAGNOSIS — Z87891 Personal history of nicotine dependence: Secondary | ICD-10-CM | POA: Insufficient documentation

## 2012-07-10 NOTE — ED Provider Notes (Signed)
History/physical exam/procedure(s) were performed by non-physician practitioner and as supervising physician I was immediately available for consultation/collaboration. I have reviewed all notes and am in agreement with care and plan.   Hilario Quarry, MD 07/10/12 [phone]

## 2012-07-10 NOTE — ED Notes (Signed)
Pt reports sore throat for 1 week, was seen by pmd on Monday and given flu shot.

## 2012-07-10 NOTE — ED Provider Notes (Signed)
History     CSN: [id number]  Arrival date & time 07/10/12  0805   First MD Initiated Contact with Patient 07/10/12 0840      Chief Complaint  Patient presents with  . Sore Throat    (Consider location/radiation/quality/duration/timing/severity/associated sxs/prior treatment) HPI Comments: States he had cold like sxs last week but that has improved.  Sore throat began 2 days ago.  Dr. Loleta Chance is PCP.  Patient is a 66 y.o. male presenting with pharyngitis. The history is provided by the patient. No language interpreter was used.  Sore Throat This is a new problem. Episode onset: 2 days ago. The problem occurs constantly. The problem has been unchanged. Associated symptoms include a sore throat. Pertinent negatives include no abdominal pain, chills, coughing, fever, nausea, swollen glands, vomiting or weakness. The symptoms are aggravated by swallowing. He has tried nothing for the symptoms.    Past Medical History  Diagnosis Date  . Coronary artery disease     s/p multiple caths 2012, stenting  . Diabetes mellitus   . Hypertension   . CHF (congestive heart failure)   . GERD (gastroesophageal reflux disease)   . PVD (peripheral vascular disease)     s/p left leg stent  . Hyperlipidemia     Past Surgical History  Procedure Date  . Coronary stent placement 2012 X2  . Femoral artery stent   . Esophagogastroduodenoscopy 02/19/10    probable occult cervical esophageal web and noncritical appearing Schatzi's ring/small hiatal hernia/otherwise normal  . Colonoscopy 12/2009    Dr. Louie Casa    Family History  Problem Relation Age of Onset  . Colon cancer Neg Hx   . Liver disease Neg Hx   . Inflammatory bowel disease Neg Hx     History  Substance Use Topics  . Smoking status: Former Smoker -- 50 years  . Smokeless tobacco: Not on file     Comment: quit 10 yrs ago  . Alcohol Use: No      Review of Systems  Constitutional: Negative for fever and chills.  HENT:  Positive for sore throat.   Respiratory: Negative for cough.   Gastrointestinal: Negative for nausea, vomiting, abdominal pain and diarrhea.  Neurological: Negative for weakness.  All other systems reviewed and are negative.    Allergies  Levaquin; Penicillins; Propoxyphene-acetaminophen; Statins; and Tape  Home Medications   Current Outpatient Rx  Name  Route  Sig  Dispense  Refill  . ASPIRIN EC 81 MG PO TBEC   Oral   Take 81 mg by mouth at bedtime.           . CLOPIDOGREL BISULFATE 75 MG PO TABS   Oral   Take 75 mg by mouth daily.           . CO Q 10 PO   Oral   Take 1 capsule by mouth daily.           . B-12 PO   Oral   Take 1 tablet by mouth daily.           . DEXLANSOPRAZOLE 60 MG PO CPDR   Oral   Take 1 capsule (60 mg total) by mouth 2 (two) times daily.   30 capsule   0   . FEXOFENADINE HCL 180 MG PO TABS   Oral   Take 180 mg by mouth daily.           . GLYBURIDE-METFORMIN 5-500 MG PO TABS   Oral   Take 1 tablet  by mouth every evening.           . IBUPROFEN 200 MG PO TABS   Oral   Take 400 mg by mouth daily as needed. For pain          . ISOSORBIDE MONONITRATE ER 30 MG PO TB24   Oral   Take 1 tablet (30 mg total) by mouth daily.   30 tablet   0   . METOPROLOL SUCCINATE ER 25 MG PO TB24   Oral   Take 25 mg by mouth at bedtime.          Marland Kitchen NITROGLYCERIN 0.4 MG SL SUBL   Sublingual   Place 0.4 mg under the tongue every 5 (five) minutes as needed. For chest pain          . PITAVASTATIN CALCIUM 2 MG PO TABS   Oral   Take 1 tablet by mouth 2 (two) times a week. Mondays and Thursdays          . POLYETHYLENE GLYCOL 3350 PO PACK      17 grams bid for three days, then once daily as needed for constipation   30 each   0   . B-6 PO   Oral   Take 1 tablet by mouth daily.           Marland Kitchen RAMIPRIL 5 MG PO CAPS   Oral   Take 5 mg by mouth daily.             BP 155/65  Pulse 63  Temp 98 F (36.7 C) (Oral)  Resp 20  Ht 6'  (1.829 m)  SpO2 99%  Physical Exam  Nursing note and vitals reviewed. Constitutional: He is oriented to person, place, and time. He appears well-developed and well-nourished.  HENT:  Head: Normocephalic and atraumatic.  Mouth/Throat: Uvula is midline and mucous membranes are normal. No uvula swelling. Posterior oropharyngeal erythema present. No oropharyngeal exudate, posterior oropharyngeal edema or tonsillar abscesses.  Eyes: EOM are normal.  Neck: Normal range of motion.  Cardiovascular: Normal rate, regular rhythm and intact distal pulses.   Pulmonary/Chest: Effort normal and breath sounds normal. No respiratory distress.  Abdominal: Soft. He exhibits no distension. There is no tenderness.  Musculoskeletal: Normal range of motion.  Lymphadenopathy:       Right cervical: No superficial cervical and no deep cervical adenopathy present.      Left cervical: No superficial cervical and no deep cervical adenopathy present.  Neurological: He is alert and oriented to person, place, and time.  Skin: Skin is warm and dry.  Psychiatric: He has a normal mood and affect. Judgment normal.    ED Course  Procedures (including critical care time)   Labs Reviewed  RAPID STREP SCREEN   No results found.   1. Pharyngitis       MDM  Strep screen neg Salt water gargles Chloraseptic Tylenol or ibuprofen F/u with PCP prn.        Evalina Field, [place] 07/10/12 [phone]

## 2012-07-21 ENCOUNTER — Other Ambulatory Visit (HOSPITAL_COMMUNITY): Payer: Self-pay | Admitting: Cardiovascular Disease

## 2012-07-21 DIAGNOSIS — R079 Chest pain, unspecified: Secondary | ICD-10-CM

## 2012-07-28 ENCOUNTER — Ambulatory Visit (HOSPITAL_COMMUNITY)
Admission: RE | Admit: 2012-07-28 | Discharge: 2012-07-28 | Disposition: A | Discharge status: Home / Self Care | Payer: Medicare Other | Source: Ambulatory Visit | Attending: Cardiovascular Disease | Admitting: Cardiovascular Disease

## 2012-07-28 DIAGNOSIS — E119 Type 2 diabetes mellitus without complications: Secondary | ICD-10-CM | POA: Insufficient documentation

## 2012-07-28 DIAGNOSIS — I10 Essential (primary) hypertension: Secondary | ICD-10-CM | POA: Insufficient documentation

## 2012-07-28 DIAGNOSIS — F17200 Nicotine dependence, unspecified, uncomplicated: Secondary | ICD-10-CM | POA: Insufficient documentation

## 2012-07-28 DIAGNOSIS — I2510 Atherosclerotic heart disease of native coronary artery without angina pectoris: Secondary | ICD-10-CM | POA: Insufficient documentation

## 2012-07-28 DIAGNOSIS — R079 Chest pain, unspecified: Secondary | ICD-10-CM | POA: Insufficient documentation

## 2012-07-28 MED ORDER — TECHNETIUM TC 99M SESTAMIBI GENERIC - CARDIOLITE
11.0000 | Freq: Once | INTRAVENOUS | Status: AC | PRN
  Administered 2012-07-28: 11 via INTRAVENOUS

## 2012-07-28 MED ORDER — TECHNETIUM TC 99M SESTAMIBI GENERIC - CARDIOLITE
30.6000 | Freq: Once | INTRAVENOUS | Status: AC | PRN
  Administered 2012-07-28: 31 via INTRAVENOUS

## 2012-07-28 NOTE — Procedures (Addendum)
[facility] [address] [place] [address] [place] [address] [phone]  Cardiology Nuclear Med Study  [name] is a 66 y.o. male     MRN : [id number]     DOB: [birth year]  Procedure Date: 07/28/2012  Nuclear Med Background Indication for Stress Test:  Evaluation for Ischemia, Stent Patency and PTCA Patency History:  CAD Cardiac Risk Factors: History of Smoking, Hypertension, Lipids and NIDDM  Symptoms:  Chest Pain   Nuclear Pre-Procedure Caffeine/Decaff Intake:  12:00am NPO After: 11:00am   IV Site: R Antecubital  IV 0.9% NS with Angio Cath:  22g  Chest Size (in):  44 IV Started by: Koren Shiver, CNMT  Height: 6' (1.829 m)  Cup Size: n/a  BMI:  Body mass index is 29.43 kg/(m^2). Weight:  217 lb (98.431 kg)   Tech Comments:  Patient held metoprolol and imdur 24 hours prior to test    Nuclear Med Study 1 or 2 day study: 1 day  Stress Test Type:  Stress  Order Authorizing Provider:  Nanetta Batty, MD   Resting Radionuclide: Technetium 2m Sestamibi  Resting Radionuclide Dose: 11.0 mCi   Stress Radionuclide:  Technetium 81m Sestamibi  Stress Radionuclide Dose: 30.6 mCi           Stress Protocol Rest HR: 67 Stress HR: 142  Rest BP: 172/92 Stress BP: 220/73  Exercise Time (min): 08:31 METS: 10.40          Dose of Adenosine (mg):  n/a Dose of Lexiscan: n/a mg  Dose of Atropine (mg): n/a Dose of Dobutamine: n/a mcg/kg/min (at max HR)  Stress Test Technologist: Ernestene Mention, CCT Nuclear Technologist: Gonzella Lex, CNMT   Rest Procedure:  Myocardial perfusion imaging was performed at rest 45 minutes following the intravenous administration of Technetium 2m Sestamibi. Stress Procedure:  The patient performed treadmill exercise using a Bruce  Protocol for 8 minutes and 31 seconds. The patient stopped due to patient complaint of his chest burning on the left side. Patient rated  his discomfort on a pain scale of 1 to 10 and  it was given a 4. The discomfort lasted for thirteen minutes then he became pain free.    There was also some ST depression during recovery in leads II, III, AVF, V5 and V6.  Technetium 45m Sestamibi was injected at peak exercise and myocardial perfusion imaging was performed after a brief delay.  Transient Ischemic Dilatation (Normal <1.22):  0.99 Lung/Heart Ratio (Normal <0.45):  0.30 QGS EDV:  124 ml QGS ESV:  49 ml LV Ejection Fraction: 60%  Signed by   Rest ECG: NSR - Normal EKG  Stress ECG: Insignificant upsloping ST segment depression.  QPS Raw Data Images:  Normal; no motion artifact; normal heart/lung ratio. Stress Images:  Mild inferoapical bowel artifact Rest Images:  moderate inferoapical bowel artifact Subtraction (SDS):  No evidence of ischemia.  Impression Exercise Capacity:  Good exercise capacity. BP Response:  Hypertensive blood pressure response. Clinical Symptoms:  Mild chest pain/dyspnea. ECG Impression:  Insignificant upsloping ST segment depression. Comparison with Prior Nuclear Study: New bowel attenuation artifact.   Overall Impression:  Low risk stress nuclear study.  Small amount of inferoseptal bowel artifact, worse at rest than stress. No reversible ischemia.   LV Wall Motion:  NL LV Function; NL Wall Motion  Chrystie Nose, MD, [facility] Attending Cardiologist [facility]  Chrystie Nose, MD  07/28/2012 4:25 PM

## 2012-08-03 ENCOUNTER — Emergency Department (HOSPITAL_COMMUNITY)
Admission: EM | Admit: 2012-08-03 | Discharge: 2012-08-04 | Disposition: A | Discharge status: Home / Self Care | Payer: Medicare Other | Attending: Emergency Medicine | Admitting: Emergency Medicine

## 2012-08-03 ENCOUNTER — Encounter (HOSPITAL_COMMUNITY): Payer: Self-pay | Admitting: *Deleted

## 2012-08-03 ENCOUNTER — Emergency Department (HOSPITAL_COMMUNITY): Payer: Medicare Other

## 2012-08-03 DIAGNOSIS — Z79899 Other long term (current) drug therapy: Secondary | ICD-10-CM | POA: Insufficient documentation

## 2012-08-03 DIAGNOSIS — R0789 Other chest pain: Secondary | ICD-10-CM | POA: Insufficient documentation

## 2012-08-03 DIAGNOSIS — E119 Type 2 diabetes mellitus without complications: Secondary | ICD-10-CM | POA: Insufficient documentation

## 2012-08-03 DIAGNOSIS — Z87891 Personal history of nicotine dependence: Secondary | ICD-10-CM | POA: Insufficient documentation

## 2012-08-03 DIAGNOSIS — E785 Hyperlipidemia, unspecified: Secondary | ICD-10-CM | POA: Insufficient documentation

## 2012-08-03 DIAGNOSIS — I2510 Atherosclerotic heart disease of native coronary artery without angina pectoris: Secondary | ICD-10-CM | POA: Insufficient documentation

## 2012-08-03 DIAGNOSIS — I739 Peripheral vascular disease, unspecified: Secondary | ICD-10-CM | POA: Insufficient documentation

## 2012-08-03 DIAGNOSIS — K219 Gastro-esophageal reflux disease without esophagitis: Secondary | ICD-10-CM | POA: Insufficient documentation

## 2012-08-03 DIAGNOSIS — R079 Chest pain, unspecified: Secondary | ICD-10-CM

## 2012-08-03 LAB — CBC
MCH: 32.7 pg (ref 26.0–34.0)
MCHC: 33.8 g/dL (ref 30.0–36.0)
MCV: 96.7 fL (ref 78.0–100.0)
Platelets: 182 10*3/uL (ref 150–400)
RBC: 4.22 MIL/uL (ref 4.22–5.81)

## 2012-08-03 LAB — BASIC METABOLIC PANEL
CO2: 31 mEq/L (ref 19–32)
Calcium: 9.5 mg/dL (ref 8.4–10.5)
Creatinine, Ser: 1.22 mg/dL (ref 0.50–1.35)

## 2012-08-03 LAB — TROPONIN I: Troponin I: <0.3 ng/mL (ref <0.30)

## 2012-08-03 MED ORDER — POTASSIUM CHLORIDE 20 MEQ PO PACK
20.0000 meq | PACK | Freq: Once | ORAL | Status: AC
  Administered 2012-08-03: 20 meq via ORAL
  Filled 2012-08-03: qty 1

## 2012-08-03 MED ORDER — ASPIRIN 325 MG PO TABS
325.0000 mg | ORAL_TABLET | ORAL | Status: AC
  Administered 2012-08-03: 325 mg via ORAL
  Filled 2012-08-03: qty 1

## 2012-08-03 NOTE — ED Notes (Signed)
Pt states that he has continued having transient chest pains lasting less than a minute. No pain now

## 2012-08-03 NOTE — ED Notes (Addendum)
Had chest pain last night relieved with ntg, recurred today 10 am and took ntg again, Took ntg last app 1 hour ago.  No sob, no nausea,  No sweats.  Says pain is sharp and intermittent.Marland Kitchen

## 2012-08-03 NOTE — ED Provider Notes (Signed)
History   This chart was scribed for Ricky Hutching, MD by Toya Smothers, ED Scribe. The patient was seen in room APA02/APA02. Patient's care was started at 2001.  CSN: [id number]  Arrival date & time 08/03/12  2001   First MD Initiated Contact with Patient 08/03/12 2008      Chief Complaint  Patient presents with  . Chest Pain   HPI  [name] is a 66 y.o. male seen by Dr. Loleta Chance, who presents to the Emergency Department complaining of 2 days of new, unchanged, intermittent, sudden onset, transient episodes of severe chest pain lasting in 1 minuet intervals. Pain is described as sharp and stabbing. No alleviators or aggravators. Typically healthy, CC represents a moderate deviation from baseline healthy. Despite taking 1 Nitroglycerine tablet yesterday and 3 tablet today, Pt reports only mild and temporary relief. No ASA prior to arrival. Pt is a former smoker, denying alcohol and illicit drug use. Medical Hx includes CAD, DM, HTN, SHF, GERD, and PVD. Surgical Hx includes coranary and femoral artery stent placement.   Past Medical History  Diagnosis Date  . Coronary artery disease     s/p multiple caths 2012, stenting  . Diabetes mellitus   . Hypertension   . CHF (congestive heart failure)   . GERD (gastroesophageal reflux disease)   . PVD (peripheral vascular disease)     s/p left leg stent  . Hyperlipidemia     Past Surgical History  Procedure Date  . Coronary stent placement 2012 X2  . Femoral artery stent   . Esophagogastroduodenoscopy 02/19/10    probable occult cervical esophageal web and noncritical appearing Schatzi's ring/small hiatal hernia/otherwise normal  . Colonoscopy 12/2009    Dr. Louie Casa    Family History  Problem Relation Age of Onset  . Colon cancer Neg Hx   . Liver disease Neg Hx   . Inflammatory bowel disease Neg Hx     History  Substance Use Topics  . Smoking status: Former Smoker -- 50 years  . Smokeless tobacco: Not on file     Comment:  quit 10 yrs ago  . Alcohol Use: No      Review of Systems  Allergies  Levaquin; Penicillins; Propoxyphene-acetaminophen; Statins; and Tape  Home Medications   Current Outpatient Rx  Name  Route  Sig  Dispense  Refill  . ASPIRIN EC 81 MG PO TBEC   Oral   Take 81 mg by mouth at bedtime.           . CLOPIDOGREL BISULFATE 75 MG PO TABS   Oral   Take 75 mg by mouth daily.           . CO Q 10 PO   Oral   Take 1 capsule by mouth daily.           . B-12 PO   Oral   Take 1 tablet by mouth daily.           . DEXLANSOPRAZOLE 60 MG PO CPDR   Oral   Take 1 capsule (60 mg total) by mouth 2 (two) times daily.   30 capsule   0   . FEXOFENADINE HCL 180 MG PO TABS   Oral   Take 180 mg by mouth daily.           . GLYBURIDE-METFORMIN 5-500 MG PO TABS   Oral   Take 1 tablet by mouth every evening.           . IBUPROFEN  200 MG PO TABS   Oral   Take 400 mg by mouth daily as needed. For pain          . ISOSORBIDE MONONITRATE ER 30 MG PO TB24   Oral   Take 1 tablet (30 mg total) by mouth daily.   30 tablet   0   . METOPROLOL SUCCINATE ER 25 MG PO TB24   Oral   Take 25 mg by mouth at bedtime.          Marland Kitchen NITROGLYCERIN 0.4 MG SL SUBL   Sublingual   Place 0.4 mg under the tongue every 5 (five) minutes as needed. For chest pain          . PITAVASTATIN CALCIUM 2 MG PO TABS   Oral   Take 1 tablet by mouth 2 (two) times a week. Mondays and Thursdays          . POLYETHYLENE GLYCOL 3350 PO PACK      17 grams bid for three days, then once daily as needed for constipation   30 each   0   . B-6 PO   Oral   Take 1 tablet by mouth daily.           Marland Kitchen RAMIPRIL 5 MG PO CAPS   Oral   Take 5 mg by mouth daily.             BP 145/76  Pulse 79  Temp 97.9 F (36.6 C) (Oral)  Resp 24  Ht 6' (1.829 m)  Wt 208 lb (94.348 kg)  BMI 28.21 kg/m2  SpO2 95%  Physical Exam  Nursing note and vitals reviewed. Constitutional: He is oriented to person, place,  and time. He appears well-developed and well-nourished.  HENT:  Head: Normocephalic and atraumatic.  Eyes: Conjunctivae normal and EOM are normal. Pupils are equal, round, and reactive to light.  Neck: Normal range of motion. Neck supple.  Cardiovascular: Normal rate, regular rhythm and normal heart sounds.   Pulmonary/Chest: Effort normal and breath sounds normal.  Abdominal: Soft. Bowel sounds are normal.  Musculoskeletal: Normal range of motion.  Neurological: He is alert and oriented to person, place, and time.  Skin: Skin is warm and dry.  Psychiatric: He has a normal mood and affect.    ED Course  Procedures DIAGNOSTIC STUDIES: Oxygen Saturation is 98% on room air, normal by my interpretation.    COORDINATION OF CARE: 20:11- Ordered ED EKG Once. 20:17- Evaluated Pt. Pt is awake, alert, and without distress. Pt reports arriving via personal transport. 20:32- Ordered DG Chest Port 1 View 1 time imaging. 20:32- Ordered Basic metabolic panel and CBC with Differential. 22:13- Rechecked Pt. Pt is feeling improved after treatment and observation in the ED.  Labs Reviewed  BASIC METABOLIC PANEL - Abnormal; Notable for the following:    Potassium 3.3 (*)     Glucose, Bld 110 (*)     GFR calc non Af Amer 61 (*)     GFR calc Af Amer 71 (*)     All other components within normal limits  CBC  TROPONIN I  TROPONIN I   Dg Chest Port 1 View  08/03/2012  *RADIOLOGY REPORT*  Clinical Data: Chest pain.  History of CHF and cardiac stents.  PORTABLE CHEST - 1 VIEW  Comparison: 07/29/2011.  CT 05/04/2012.  Findings: Stable appearance of the chest compared to prior.  No airspace disease.  No effusion.  Cardiopericardial silhouette and mediastinal contours are within normal limits.  Monitoring leads are projected over the chest. Radiopaque density over the lateral right chest appears similar to prior.  IMPRESSION: No acute cardiopulmonary disease.   Original Report Authenticated By: Andreas Newport, M.D.      No diagnosis found.  Date: 08/03/2012  Rate: 81  Rhythm: normal sinus rhythm  QRS Axis: normal  Intervals: normal  ST/T Wave abnormalities: normal  Conduction Disutrbances: 1st degree av block  Narrative Interpretation: unremarkable      MDM  Patient is hemodynamically stable.  Pain is very fleeting. First troponin negative.  Will get second troponin.  Discussed with Dr. Colon Branch     I personally performed the services described in this documentation, which was scribed in my presence. The recorded information has been reviewed and is accurate.    Ricky Hutching, MD 08/03/12 2250

## 2012-08-04 LAB — TROPONIN I: Troponin I: <0.3 ng/mL (ref <0.30)

## 2012-08-04 NOTE — ED Notes (Signed)
Pt has no complaints of pain at this time, vitals stable, pt is alert and oriented.

## 2012-08-04 NOTE — Progress Notes (Signed)
2310 Assumed care/disposition of patient who presented with fleeting chest pain. EKG, chest xray and first troponin negative. Awaiting second troponin.  0020 Second troponin negative.Patient has been resting comfortably.Reviewed results with patient. Discharged home.  Results for orders placed during the hospital encounter of 08/03/12  CBC      Component Value Range   WBC 6.2  4.0 - 10.5 K/uL   RBC 4.22  4.22 - 5.81 MIL/uL   Hemoglobin 13.8  13.0 - 17.0 g/dL   HCT 09.8  11.9 - 14.7 %   MCV 96.7  78.0 - 100.0 fL   MCH 32.7  26.0 - 34.0 pg   MCHC 33.8  30.0 - 36.0 g/dL   RDW 82.9  56.2 - 13.0 %   Platelets 182  150 - 400 K/uL  BASIC METABOLIC PANEL      Component Value Range   Sodium 139  135 - 145 mEq/L   Potassium 3.3 (*) 3.5 - 5.1 mEq/L   Chloride 102  96 - 112 mEq/L   CO2 31  19 - 32 mEq/L   Glucose, Bld 110 (*) 70 - 99 mg/dL   BUN 13  6 - 23 mg/dL   Creatinine, Ser 8.65  0.50 - 1.35 mg/dL   Calcium 9.5  8.4 - 78.4 mg/dL   GFR calc non Af Amer 61 (*) >90 mL/min   GFR calc Af Amer 71 (*) >90 mL/min  TROPONIN I      Component Value Range   Troponin I <0.30  <0.30 ng/mL  TROPONIN I      Component Value Range   Troponin I <0.30  <0.30 ng/mL

## 2012-10-25 ENCOUNTER — Telehealth: Payer: Self-pay | Admitting: *Deleted

## 2012-10-25 NOTE — Telephone Encounter (Signed)
Ms Toothman called today for her husband. He is currently on Dexilant, however it does not seem to be helping. They would like to either be seen or try some different medicine. Please follow up. Thank you.

## 2012-10-25 NOTE — Telephone Encounter (Signed)
Offer OV.  I'm not sure what else he has tried? If he has not tried Nexium or Zegerid, we could give samples of one of these.

## 2012-10-25 NOTE — Telephone Encounter (Signed)
Pt has not been seen since 08/30/11 by LSL. Per his U/S report he was doing well on dexilant after his ov. Since it has been over 1 year, do you want [place] to bring pt in for an ov first? Please advise.

## 2012-10-26 NOTE — Telephone Encounter (Signed)
Pt is aware of OV on 3/26 at 0930 with AS

## 2012-10-26 NOTE — Telephone Encounter (Signed)
Pt has not tried nexium. #5 boxes at the front desk. Pt aware.  Darl Pikes, please schedule pt appt. Thanks.

## 2012-11-03 ENCOUNTER — Ambulatory Visit (INDEPENDENT_AMBULATORY_CARE_PROVIDER_SITE_OTHER): Payer: 59 | Admitting: Gastroenterology

## 2012-11-03 ENCOUNTER — Encounter: Payer: Self-pay | Admitting: Gastroenterology

## 2012-11-03 VITALS — BP 124/69 | HR 58 | Temp 98.2°F | Ht 72.0 in | Wt 214.6 lb

## 2012-11-03 DIAGNOSIS — K5900 Constipation, unspecified: Secondary | ICD-10-CM

## 2012-11-03 DIAGNOSIS — K219 Gastro-esophageal reflux disease without esophagitis: Secondary | ICD-10-CM

## 2012-11-03 MED ORDER — DEXLANSOPRAZOLE 60 MG PO CPDR: 60.0000 mg | DELAYED_RELEASE_CAPSULE | Freq: Two times a day (BID) | ORAL | Status: DC

## 2012-11-03 NOTE — Progress Notes (Signed)
Faxed to PCP

## 2012-11-03 NOTE — Patient Instructions (Addendum)
Stop Nexium for now.   I have sent a refill to your pharmacy for Dexilant. Take this once in the morning and at night. You may not need it twice a day all the time, but if you do, you may take it.   If this still does not help, please let us know. We may ultimately have to try Nexium.  We will see you back in 2 years!  Diet for Gastroesophageal Reflux Disease, Adult Reflux (acid reflux) is when acid from your stomach flows up into the esophagus. When acid comes in contact with the esophagus, the acid causes irritation and soreness (inflammation) in the esophagus. When reflux happens often or so severely that it causes damage to the esophagus, it is called gastroesophageal reflux disease (GERD). Nutrition therapy can help ease the discomfort of GERD. FOODS OR DRINKS TO AVOID OR LIMIT  Smoking or chewing tobacco. Nicotine is one of the most potent stimulants to acid production in the gastrointestinal tract.  Caffeinated and decaffeinated coffee and black tea.  Regular or low-calorie carbonated beverages or energy drinks (caffeine-free carbonated beverages are allowed).   Strong spices, such as black pepper, white pepper, red pepper, cayenne, curry powder, and chili powder.  Peppermint or spearmint.  Chocolate.  High-fat foods, including meats and fried foods. Extra added fats including oils, butter, salad dressings, and nuts. Limit these to less than 8 tsp per day.  Fruits and vegetables if they are not tolerated, such as citrus fruits or tomatoes.  Alcohol.  Any food that seems to aggravate your condition. If you have questions regarding your diet, call your caregiver or a registered dietitian. OTHER THINGS THAT MAY HELP GERD INCLUDE:   Eating your meals slowly, in a relaxed setting.  Eating 5 to 6 small meals per day instead of 3 large meals.  Eliminating food for a period of time if it causes distress.  Not lying down until 3 hours after eating a meal.  Keeping the head of  your bed raised 6 to 9 inches (15 to 23 cm) by using a foam wedge or blocks under the legs of the bed. Lying flat may make symptoms worse.  Being physically active. Weight loss may be helpful in reducing reflux in overweight or obese adults.  Wear loose fitting clothing EXAMPLE MEAL PLAN This meal plan is approximately 2,000 calories based on https://www.bernard.org/ meal planning guidelines. Breakfast   cup cooked oatmeal.  1 cup strawberries.  1 cup low-fat milk.  1 oz almonds. Snack  1 cup cucumber slices.  6 oz yogurt (made from low-fat or fat-free milk). Lunch  2 slice whole-wheat bread.  2 oz sliced [place].  2 tsp mayonnaise.  1 cup blueberries.  1 cup snap peas. Snack  6 whole-wheat crackers.  1 oz string cheese. Dinner   cup brown rice.  1 cup mixed veggies.  1 tsp olive oil.  3 oz grilled fish. Document Released: 07/28/2005 Document Revised: 10/20/2011 Document Reviewed: 06/13/2011 [facility] Patient Information 2013 [place], [place].

## 2012-11-03 NOTE — Assessment & Plan Note (Signed)
Occasional, no rectal bleeding, TCS on file from 2011 by Dr. Lovell Sheehan. No history of polyps per patient. Unless otherwise indicated, TCS in 2021 for screening purposes. Discussed using Miralax prn, high fiber diet.

## 2012-11-03 NOTE — Assessment & Plan Note (Signed)
67 year old male with history of GERD, last EGD in 2011 as above. No dysphagia. He has tried/failed pantoprazole, Aciphex, and Prevacid. Last visit in Jan 2013 started on Dexilant with good control of symptoms until the past few months. Due to his use of Plavix, I would like to avoid Nexium in this gentleman. However, he had sampled Nexium with good results. For now, we will restart Dexilant, prescribed at BID dosing if needed. Follow GERD diet. May ultimately need to trial Nexium, but with his cardiac history, I do not want to possibly affect Plavix. Discussed this with patient, and he agrees with this plan. Return in 2 years or sooner if needed. Dexilant prescription BID provided.

## 2012-11-03 NOTE — Progress Notes (Signed)
Referring Provider: Mirna Mires, MD Primary Care Physician:  Evlyn Courier, MD Primary Gastroenterologist: Dr. Jena Gauss   Chief Complaint  Patient presents with  . Gastrophageal Reflux    HPI:   [name] is a pleasant 67 year old male presenting today with a history of GERD, dysphagia, abdominal pain, constipation. 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 He has failed pantoprazole, Aciphex, and Prevacid in the past. [place] of abdomen last year with no gallstones. We recently gave samples of Nexium to try, as he had called in stating Dexilant was not working.    He is now taking Nexium once a day. States his symptoms are improved with this regimen, better than Dexilant. No dysphagia. Denies abdominal pain. No N/V. Had a stress test in Dec, states his heart is fine. About 2 weeks ago noted pain in his chest, "moving". Trying to follow a reflux diet. Appetite is good. Weight is stable. States has dealt with constipation, BM usually once per day. When he was having reflux issues, would go every 2 days, and then it wouldn't be "normal". Noted as hard. Took 3 tbsp of baking soda, which cleaned him out and provided relief. No rectal bleeding.   Past Medical History  Diagnosis Date  . Coronary artery disease     s/p multiple caths 2012, stenting  . Diabetes mellitus   . Hypertension   . CHF (congestive heart failure)   . GERD (gastroesophageal reflux disease)   . PVD (peripheral vascular disease)     s/p left leg stent  . Hyperlipidemia     Past Surgical History  Procedure Laterality Date  . Coronary stent placement  2012 X2  . Femoral artery stent    . 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 Colonoscopy  12/2009    Dr. Louie Casa    Current Outpatient Prescriptions  Medication Sig Dispense Refill  .  aspirin (ECOTRIN) 325 MG EC tablet Take 325 mg by mouth daily.      . clopidogrel (PLAVIX) 75 MG tablet Take 75 mg by mouth daily.        . Coenzyme Q10 (CO Q 10) 100 MG CAPS Take 1 capsule by mouth daily.      Marland Kitchen esomeprazole (NEXIUM) 40 MG capsule Take 40 mg by mouth daily before breakfast.      . fexofenadine (ALLEGRA) 180 MG tablet Take 180 mg by mouth daily.        Marland Kitchen glyBURIDE-metformin (GLUCOVANCE) 5-500 MG per tablet Take 1 tablet by mouth every evening.        Marland Kitchen ibuprofen (ADVIL,MOTRIN) 200 MG tablet Take 400 mg by mouth daily as needed. For pain       . isosorbide mononitrate (IMDUR) 30 MG 24 hr tablet Take 30 mg by mouth daily.      . metoprolol succinate (TOPROL-XL) 50 MG 24 hr tablet Take 50 mg by mouth daily. Take with or immediately following a meal.      . nitroGLYCERIN (NITROSTAT) 0.4 MG SL tablet Place 0.4 mg under the tongue every 5 (five) minutes as needed. For chest pain       . pyridOXINE (VITAMIN B-6) 100 MG tablet Take 100 mg by mouth daily.      . ramipril (ALTACE) 5 MG capsule Take 5 mg by mouth daily.        . vitamin B-12 (CYANOCOBALAMIN) 1000  MCG tablet Take 1,000 mcg by mouth daily.      Marland Kitchen dexlansoprazole (DEXILANT) 60 MG capsule Take 60 mg by mouth daily.       No current facility-administered medications for this visit.    Allergies as of 11/03/2012 - Review Complete 11/03/2012  Allergen Reaction Noted  . Propoxyphene-acetaminophen    . Statins Other (See Comments) 07/29/2011  . Tape  07/29/2011  . Levaquin (levofloxacin hemihydrate) Rash 07/29/2011  . Penicillins Rash     Family History  Problem Relation Age of Onset  . Colon cancer Neg Hx   . Liver disease Neg Hx   . Inflammatory bowel disease Neg Hx     History   Social History  . Marital Status: Married    Spouse Name: N/A    Number of Children: 3  . Years of Education: N/A   Occupational History  . LAB TECH [employer]   Social History Main Topics  . Smoking status: Former Smoker  -- 50 years  . Smokeless tobacco: None     Comment: quit 10 yrs ago  . Alcohol Use: No  . Drug Use: No  . Sexually Active: No   Other Topics Concern  . None   Social History Narrative  . None    Review of Systems: Gen: Denies fever, chills, anorexia. Denies fatigue, weakness, weight loss.  CV: Denies chest pain, palpitations, syncope, peripheral edema, and claudication. Resp: Denies dyspnea at rest, cough, wheezing, coughing up blood, and pleurisy. GI: SEE HPI Derm: Denies rash, itching, dry skin Psych: Denies depression, anxiety, memory loss, confusion. No homicidal or suicidal ideation.  Heme: Denies bruising, bleeding, and enlarged lymph nodes.  Physical Exam: BP 124/69  Pulse 58  Temp(Src) 98.2 F (36.8 C) (Oral)  Ht 6' (1.829 m)  Wt 214 lb 9.6 oz (97.342 kg)  BMI 29.1 kg/m2 General:   Alert and oriented. No distress noted. Pleasant and cooperative.  Head:  Normocephalic and atraumatic. Eyes:  Conjuctiva clear without scleral icterus. Mouth:  Oral mucosa pink and moist. Good dentition. No lesions. Heart:  S1, S2 present without murmurs, rubs, or gallops. Regular rate and rhythm. Abdomen:  +BS, soft, non-tender and non-distended. No rebound or guarding. No HSM or masses noted. Msk:  Symmetrical without gross deformities. Normal posture. Extremities:  Without edema. Neurologic:  Alert and  oriented x4;  grossly normal neurologically. Skin:  Intact without significant lesions or rashes. Psych:  Alert and cooperative. Normal mood and affect.

## 2013-01-18 ENCOUNTER — Other Ambulatory Visit (HOSPITAL_COMMUNITY): Payer: Self-pay | Admitting: Cardiovascular Disease

## 2013-01-19 ENCOUNTER — Other Ambulatory Visit (HOSPITAL_COMMUNITY): Payer: Self-pay | Admitting: Cardiovascular Disease

## 2013-01-19 DIAGNOSIS — I340 Nonrheumatic mitral (valve) insufficiency: Secondary | ICD-10-CM

## 2013-01-20 NOTE — Telephone Encounter (Signed)
Still have not received his Altace 5mg  yet-Please call today-he is completely out of it-Call to [facility]-[phone]!

## 2013-01-24 ENCOUNTER — Telehealth (HOSPITAL_COMMUNITY): Payer: Self-pay | Admitting: Cardiovascular Disease

## 2013-01-27 ENCOUNTER — Ambulatory Visit (HOSPITAL_COMMUNITY): Payer: Medicare Other

## 2013-03-11 ENCOUNTER — Other Ambulatory Visit: Payer: Self-pay | Admitting: Cardiovascular Disease

## 2013-03-11 NOTE — Telephone Encounter (Signed)
Rx was sent to pharmacy electronically. 

## 2013-03-25 ENCOUNTER — Encounter: Payer: Self-pay | Admitting: Cardiovascular Disease

## 2013-03-28 ENCOUNTER — Ambulatory Visit (INDEPENDENT_AMBULATORY_CARE_PROVIDER_SITE_OTHER): Payer: Medicare Other | Admitting: Cardiovascular Disease

## 2013-03-28 ENCOUNTER — Encounter: Payer: Self-pay | Admitting: Cardiovascular Disease

## 2013-03-28 VITALS — BP 126/82 | HR 52 | Ht 72.0 in | Wt 216.0 lb

## 2013-03-28 DIAGNOSIS — I739 Peripheral vascular disease, unspecified: Secondary | ICD-10-CM

## 2013-03-28 DIAGNOSIS — E785 Hyperlipidemia, unspecified: Secondary | ICD-10-CM

## 2013-03-28 DIAGNOSIS — Z79899 Other long term (current) drug therapy: Secondary | ICD-10-CM

## 2013-03-28 DIAGNOSIS — Z9861 Coronary angioplasty status: Secondary | ICD-10-CM

## 2013-03-28 DIAGNOSIS — Z955 Presence of coronary angioplasty implant and graft: Secondary | ICD-10-CM

## 2013-03-28 DIAGNOSIS — I10 Essential (primary) hypertension: Secondary | ICD-10-CM

## 2013-03-28 MED ORDER — NITROGLYCERIN 0.4 MG SL SUBL: 0.4000 mg | SUBLINGUAL_TABLET | SUBLINGUAL | Status: DC | PRN

## 2013-03-28 NOTE — Assessment & Plan Note (Signed)
We will recheck a lipid and liver profile. This was last checked 07/22/12 revealing a total cholesterol of 162, LDL 103 and HDL of 37. He is statin intolerant on fenofibrate

## 2013-03-28 NOTE — Progress Notes (Signed)
03/28/2013 BURNETT SPRAY   06-06-1946  [id number]  Primary Physician Evlyn Courier, MD Primary Cardiologist: Runell Gess MD Roseanne Reno   HPI:  The patient is a 67 year old mildly overweight married [place] American male father of 3 who I last saw in the office 2 months ago. He has a history of PVOD status post left SFA, PTA and stenting by myself back in July of 2006 with subsequent improvement in his claudication and Dopplers. These were last done in April of last year and showed ABIs of greater than 1 bilaterally. His other problems include hypertension, hyperlipidemia, non-insulin-requiring diabetes and statin intolerance. I catheterized him August 28, 2010 and stented his first OM branch with a bare metal stent. He was readmitted September 09, 2010 with recurrent chest pain and was re-cathed by Dr. Herbie Baltimore via the right radial approach revealing a widely patent stent. He was admitted again June 29th through July 3rd with chest pain and ruled out for myocardial infarction. His stress test showed subtle lateral ischemia and cath performed by Dr. Tresa Endo February 10, 2012 revealed 95% proximal in-stent restenosis within the OM stent which Dr. Tresa Endo opened up with a cutting balloon. He had done well until December of last year when he was admitted again with chest pain. Dr. Herbie Baltimore recathed him revealing again in-stent restenosis which was "restented" with a Promus drug-eluting stent. He was having chest pain when I saw him back in December, however, since that time he has had minimal chest pain and he does walk 3-5 miles a day. His most recent liver profile performed in December revealed a total cholesterol of 162, LDL of 103, and HDL of 37. Since I saw him 6 months ago he is asymptomatic. He specifically denies chest pain or shortness of breath.      Current Outpatient Prescriptions  Medication Sig Dispense Refill  . aspirin (ECOTRIN) 325 MG EC tablet Take 325 mg by mouth daily.        . clopidogrel (PLAVIX) 75 MG tablet Take 75 mg by mouth daily.        . Coenzyme Q10 (CO Q 10) 100 MG CAPS Take 1 capsule by mouth daily.      Marland Kitchen dexlansoprazole (DEXILANT) 60 MG capsule Take 60 mg by mouth daily.      . fexofenadine (ALLEGRA) 180 MG tablet Take 180 mg by mouth daily.        Marland Kitchen glyBURIDE-metformin (GLUCOVANCE) 5-500 MG per tablet Take 1 tablet by mouth every evening.        Marland Kitchen ibuprofen (ADVIL,MOTRIN) 200 MG tablet Take 400 mg by mouth daily as needed. For pain       . isosorbide mononitrate (IMDUR) 30 MG 24 hr tablet TAKE 1 TABLET EVERY DAY.  30 tablet  6  . metoprolol succinate (TOPROL-XL) 50 MG 24 hr tablet TAKE ONE TABLET DAILY.  30 tablet  6  . nitroGLYCERIN (NITROSTAT) 0.4 MG SL tablet Place 0.4 mg under the tongue every 5 (five) minutes as needed. For chest pain       . pyridOXINE (VITAMIN B-6) 100 MG tablet Take 100 mg by mouth daily.      . ramipril (ALTACE) 5 MG capsule TAKE (1) CAPSULE BY MOUTH ONCE DAILY.  30 capsule  6  . vitamin B-12 (CYANOCOBALAMIN) 1000 MCG tablet Take 1,000 mcg by mouth daily.       No current facility-administered medications for this visit.    Allergies  Allergen Reactions  . Propoxyphene-Acetaminophen   .  Statins Other (See Comments)    Severe muscle cramping/aching/pain  . Tape     Blisters  . Levaquin [Levofloxacin Hemihydrate] Rash  . Penicillins Rash    History   Social History  . Marital Status: Married    Spouse Name: N/A    Number of Children: 3  . Years of Education: N/A   Occupational History  . LAB TECH [employer]   Social History Main Topics  . Smoking status: Former Smoker -- 50 years  . Smokeless tobacco: Not on file     Comment: quit 10 yrs ago  . Alcohol Use: No  . Drug Use: No  . Sexual Activity: No   Other Topics Concern  . Not on file   Social History Narrative  . No narrative on file     Review of Systems: General: negative for chills, fever, night sweats or weight changes.   Cardiovascular: negative for chest pain, dyspnea on exertion, edema, orthopnea, palpitations, paroxysmal nocturnal dyspnea or shortness of breath Dermatological: negative for rash Respiratory: negative for cough or wheezing Urologic: negative for hematuria Abdominal: negative for nausea, vomiting, diarrhea, bright red blood per rectum, melena, or hematemesis Neurologic: negative for visual changes, syncope, or dizziness All other systems reviewed and are otherwise negative except as noted above.    Blood pressure 126/82, pulse 52, height 6' (1.829 m), weight 216 lb (97.977 kg).  General appearance: alert and no distress Neck: no adenopathy, no carotid bruit, no JVD, supple, symmetrical, trachea midline and thyroid not enlarged, symmetric, no tenderness/mass/nodules Lungs: clear to auscultation bilaterally Heart: regular rate and rhythm, S1, S2 normal, no murmur, click, rub or gallop Extremities: extremities normal, atraumatic, no cyanosis or edema  EKG sinus bradycardia at 52 without ST or T wave changes  ASSESSMENT AND PLAN:   Stented coronary artery [name] has had multiple interventions in the past beginning in January 2012 foot where his OM branch was stented with a bare-metal stent. Dr. Tresa Endo M7/2/13 revealing a 95% proximal in-stent restenosis within the OM branch which was opened up with cutting balloon. Dr. Herbie Baltimore recath him revealing "in-stent restenosis and he was restented with a Promus drug-eluting stent. Since using Lasix months ago he denies chest pain or shortness of breath.  Dyslipidemia We will recheck a lipid and liver profile. This was last checked 07/22/12 revealing a total cholesterol of 162, LDL 103 and HDL of 37. He is statin intolerant on fenofibrate  PVD He has not had lower accomplished in a year, we will recheck these. He currently denies claudication.      Runell Gess MD FACP,FACC,FAHA, [facility] 03/28/2013 10:54 AM

## 2013-03-28 NOTE — Assessment & Plan Note (Signed)
He has not had lower accomplished in a year, we will recheck these. He currently denies claudication.

## 2013-03-28 NOTE — Assessment & Plan Note (Signed)
Mr. Koeppen has had multiple interventions in the past beginning in January 2012 foot where his OM branch was stented with a bare-metal stent. Dr. Tresa Endo M7/2/13 revealing a 95% proximal in-stent restenosis within the OM branch which was opened up with cutting balloon. Dr. Herbie Baltimore recath him revealing "in-stent restenosis and he was restented with a Promus drug-eluting stent. Since using Lasix months ago he denies chest pain or shortness of breath.

## 2013-03-28 NOTE — Patient Instructions (Signed)
  Your physician wants you to follow-up with him in : 1 year                                             and with an extender in : 6 months                    You will receive a reminder letter in the mail one month in advance. If you don't receive a letter, please call our office to schedule the follow-up appointment.   Your physician recommends that you return for lab work in: 1-2 weeks, fasting   Your physician has ordered the following tests: lower extremity arterial doppler

## 2013-04-13 ENCOUNTER — Other Ambulatory Visit: Payer: Self-pay | Admitting: Cardiovascular Disease

## 2013-04-13 LAB — LIPID PANEL

## 2013-04-13 LAB — HEPATIC FUNCTION PANEL
ALT: 13 U/L (ref 0–53)
Albumin: 4.1 g/dL (ref 3.5–5.2)
Bilirubin, Direct: 0.1 mg/dL (ref 0.0–0.3)
Indirect Bilirubin: 0.7 mg/dL (ref 0.0–0.9)

## 2013-04-14 ENCOUNTER — Encounter: Payer: Self-pay | Admitting: *Deleted

## 2013-04-20 ENCOUNTER — Ambulatory Visit (HOSPITAL_COMMUNITY)
Admission: RE | Admit: 2013-04-20 | Discharge: 2013-04-20 | Disposition: A | Discharge status: Home / Self Care | Payer: Medicare Other | Source: Ambulatory Visit | Attending: Cardiovascular Disease | Admitting: Cardiovascular Disease

## 2013-04-20 DIAGNOSIS — I739 Peripheral vascular disease, unspecified: Secondary | ICD-10-CM | POA: Insufficient documentation

## 2013-04-20 DIAGNOSIS — I70219 Atherosclerosis of native arteries of extremities with intermittent claudication, unspecified extremity: Secondary | ICD-10-CM

## 2013-04-20 NOTE — Progress Notes (Signed)
Arterial Duplex Lower Ext. Completed. Kaimana Lurz, BS, RDMS, RVT  

## 2013-05-02 ENCOUNTER — Telehealth: Payer: Self-pay | Admitting: *Deleted

## 2013-05-02 ENCOUNTER — Encounter: Payer: Self-pay | Admitting: *Deleted

## 2013-05-02 DIAGNOSIS — I739 Peripheral vascular disease, unspecified: Secondary | ICD-10-CM

## 2013-05-02 NOTE — Telephone Encounter (Signed)
Message copied by Marella Bile on Mon May 02, 2013 10:17 PM ------      Message from: Runell Gess      Created: Sun May 01, 2013 10:51 AM       No change from prior study. Repeat in 12 months. ------

## 2013-05-02 NOTE — Telephone Encounter (Signed)
Order placed for repeat lower extremity dopplers in 1 year 

## 2013-05-11 ENCOUNTER — Telehealth: Payer: Self-pay | Admitting: *Deleted

## 2013-05-11 DIAGNOSIS — E781 Pure hyperglyceridemia: Secondary | ICD-10-CM

## 2013-05-11 NOTE — Telephone Encounter (Signed)
Message copied by Marella Bile on Wed May 11, 2013  9:26 AM ------      Message from: Runell Gess      Created: Sun May 08, 2013 10:39 AM       Start tricor 145 and recheck 3 months ------

## 2013-05-26 LAB — LIPID PANEL
HDL: 34 mg/dL — ABNORMAL LOW (ref >39)
LDL Cholesterol: 90 mg/dL (ref 0–99)
Total CHOL/HDL Ratio: 4.8 Ratio

## 2013-05-30 ENCOUNTER — Encounter: Payer: Self-pay | Admitting: *Deleted

## 2013-06-08 ENCOUNTER — Telehealth: Payer: Self-pay | Admitting: Cardiovascular Disease

## 2013-06-08 NOTE — Telephone Encounter (Signed)
na

## 2013-06-09 ENCOUNTER — Other Ambulatory Visit: Payer: Self-pay | Admitting: Cardiovascular Disease

## 2013-06-09 NOTE — Telephone Encounter (Signed)
Rx was sent to pharmacy electronically. 

## 2013-07-18 ENCOUNTER — Other Ambulatory Visit: Payer: Self-pay | Admitting: Cardiovascular Disease

## 2013-08-18 ENCOUNTER — Other Ambulatory Visit: Payer: Self-pay | Admitting: Cardiovascular Disease

## 2013-08-18 ENCOUNTER — Other Ambulatory Visit: Payer: Self-pay | Admitting: *Deleted

## 2013-08-18 NOTE — Telephone Encounter (Signed)
Rx was sent to pharmacy electronically. 

## 2013-09-07 ENCOUNTER — Inpatient Hospital Stay (HOSPITAL_COMMUNITY)
Admission: EM | Admit: 2013-09-07 | Discharge: 2013-09-09 | DRG: 313 | Disposition: A | Discharge status: Home / Self Care | Payer: Medicare Other | Attending: Internal Medicine | Admitting: Internal Medicine

## 2013-09-07 ENCOUNTER — Emergency Department (HOSPITAL_COMMUNITY): Payer: Medicare Other

## 2013-09-07 ENCOUNTER — Encounter (HOSPITAL_COMMUNITY): Payer: Self-pay | Admitting: Emergency Medicine

## 2013-09-07 DIAGNOSIS — Z87891 Personal history of nicotine dependence: Secondary | ICD-10-CM

## 2013-09-07 DIAGNOSIS — R079 Chest pain, unspecified: Secondary | ICD-10-CM

## 2013-09-07 DIAGNOSIS — K219 Gastro-esophageal reflux disease without esophagitis: Secondary | ICD-10-CM | POA: Diagnosis present

## 2013-09-07 DIAGNOSIS — Z79899 Other long term (current) drug therapy: Secondary | ICD-10-CM

## 2013-09-07 DIAGNOSIS — E119 Type 2 diabetes mellitus without complications: Secondary | ICD-10-CM | POA: Diagnosis present

## 2013-09-07 DIAGNOSIS — I10 Essential (primary) hypertension: Secondary | ICD-10-CM | POA: Diagnosis present

## 2013-09-07 DIAGNOSIS — I509 Heart failure, unspecified: Secondary | ICD-10-CM | POA: Diagnosis present

## 2013-09-07 DIAGNOSIS — E1151 Type 2 diabetes mellitus with diabetic peripheral angiopathy without gangrene: Secondary | ICD-10-CM | POA: Diagnosis present

## 2013-09-07 DIAGNOSIS — Z955 Presence of coronary angioplasty implant and graft: Secondary | ICD-10-CM

## 2013-09-07 DIAGNOSIS — I739 Peripheral vascular disease, unspecified: Secondary | ICD-10-CM | POA: Diagnosis present

## 2013-09-07 DIAGNOSIS — E118 Type 2 diabetes mellitus with unspecified complications: Secondary | ICD-10-CM | POA: Diagnosis present

## 2013-09-07 DIAGNOSIS — Z9861 Coronary angioplasty status: Secondary | ICD-10-CM

## 2013-09-07 DIAGNOSIS — Z7982 Long term (current) use of aspirin: Secondary | ICD-10-CM

## 2013-09-07 DIAGNOSIS — I2510 Atherosclerotic heart disease of native coronary artery without angina pectoris: Secondary | ICD-10-CM | POA: Diagnosis present

## 2013-09-07 DIAGNOSIS — E785 Hyperlipidemia, unspecified: Secondary | ICD-10-CM | POA: Diagnosis present

## 2013-09-07 DIAGNOSIS — R0789 Other chest pain: Principal | ICD-10-CM | POA: Diagnosis present

## 2013-09-07 LAB — COMPREHENSIVE METABOLIC PANEL
ALBUMIN: 3.6 g/dL (ref 3.5–5.2)
ALK PHOS: 53 U/L (ref 39–117)
ALT: 19 U/L (ref 0–53)
AST: 26 U/L (ref 0–37)
BILIRUBIN TOTAL: 0.9 mg/dL (ref 0.3–1.2)
BUN: 17 mg/dL (ref 6–23)
CHLORIDE: 102 meq/L (ref 96–112)
CO2: 24 meq/L (ref 19–32)
CREATININE: 1.21 mg/dL (ref 0.50–1.35)
Calcium: 9.2 mg/dL (ref 8.4–10.5)
GFR calc Af Amer: 70 mL/min — ABNORMAL LOW (ref >90)
GFR calc non Af Amer: 61 mL/min — ABNORMAL LOW (ref >90)
Glucose, Bld: 146 mg/dL — ABNORMAL HIGH (ref 70–99)
POTASSIUM: 4.2 meq/L (ref 3.7–5.3)
Sodium: 138 mEq/L (ref 137–147)
Total Protein: 7.3 g/dL (ref 6.0–8.3)

## 2013-09-07 LAB — CBC WITH DIFFERENTIAL/PLATELET
BASOS PCT: 0 % (ref 0–1)
Basophils Absolute: 0 10*3/uL (ref 0.0–0.1)
Eosinophils Absolute: 0 10*3/uL (ref 0.0–0.7)
Eosinophils Relative: 1 % (ref 0–5)
HCT: 39.6 % (ref 39.0–52.0)
Hemoglobin: 13.6 g/dL (ref 13.0–17.0)
Lymphocytes Relative: 31 % (ref 12–46)
Lymphs Abs: 2.1 10*3/uL (ref 0.7–4.0)
MCH: 32.7 pg (ref 26.0–34.0)
MCHC: 34.3 g/dL (ref 30.0–36.0)
MCV: 95.2 fL (ref 78.0–100.0)
MONO ABS: 0.4 10*3/uL (ref 0.1–1.0)
Monocytes Relative: 6 % (ref 3–12)
NEUTROS PCT: 62 % (ref 43–77)
Neutro Abs: 4.2 10*3/uL (ref 1.7–7.7)
Platelets: 162 10*3/uL (ref 150–400)
RBC: 4.16 MIL/uL — ABNORMAL LOW (ref 4.22–5.81)
RDW: 13.2 % (ref 11.5–15.5)
WBC: 6.7 10*3/uL (ref 4.0–10.5)

## 2013-09-07 LAB — BASIC METABOLIC PANEL
BUN: 17 mg/dL (ref 6–23)
CALCIUM: 9.3 mg/dL (ref 8.4–10.5)
CO2: 27 mEq/L (ref 19–32)
CREATININE: 1.23 mg/dL (ref 0.50–1.35)
Chloride: 103 mEq/L (ref 96–112)
GFR, EST AFRICAN AMERICAN: 69 mL/min — AB (ref >90)
GFR, EST NON AFRICAN AMERICAN: 59 mL/min — AB (ref >90)
GLUCOSE: 144 mg/dL — AB (ref 70–99)
POTASSIUM: 4.1 meq/L (ref 3.7–5.3)
Sodium: 139 mEq/L (ref 137–147)

## 2013-09-07 LAB — PROTIME-INR
INR: 1.08 (ref 0.00–1.49)
Prothrombin Time: 13.8 seconds (ref 11.6–15.2)

## 2013-09-07 LAB — TROPONIN I
Troponin I: <0.3 ng/mL (ref <0.30)
Troponin I: <0.3 ng/mL (ref <0.30)
Troponin I: <0.3 ng/mL (ref <0.30)

## 2013-09-07 LAB — APTT: aPTT: 30 seconds (ref 24–37)

## 2013-09-07 LAB — MAGNESIUM: Magnesium: 1.9 mg/dL (ref 1.5–2.5)

## 2013-09-07 LAB — PRO B NATRIURETIC PEPTIDE: Pro B Natriuretic peptide (BNP): 42.4 pg/mL (ref 0–125)

## 2013-09-07 LAB — PHOSPHORUS: PHOSPHORUS: 2.4 mg/dL (ref 2.3–4.6)

## 2013-09-07 MED ORDER — CLOPIDOGREL BISULFATE 75 MG PO TABS: 75.0000 mg | ORAL_TABLET | Freq: Every day | ORAL | Status: DC

## 2013-09-07 MED ORDER — DM-GUAIFENESIN ER 30-600 MG PO TB12
1.0000 | ORAL_TABLET | Freq: Two times a day (BID) | ORAL | Status: DC
  Administered 2013-09-07 – 2013-09-09 (×4): 1 via ORAL
  Filled 2013-09-07 (×4): qty 1

## 2013-09-07 MED ORDER — LORATADINE 10 MG PO TABS
10.0000 mg | ORAL_TABLET | Freq: Every day | ORAL | Status: DC
  Administered 2013-09-08 – 2013-09-09 (×2): 10 mg via ORAL
  Filled 2013-09-07 (×2): qty 1

## 2013-09-07 MED ORDER — IBUPROFEN 800 MG PO TABS
400.0000 mg | ORAL_TABLET | Freq: Every day | ORAL | Status: DC | PRN
  Administered 2013-09-09: 400 mg via ORAL
  Filled 2013-09-07: qty 1

## 2013-09-07 MED ORDER — VITAMIN B-6 50 MG PO TABS
100.0000 mg | ORAL_TABLET | Freq: Every day | ORAL | Status: DC
  Administered 2013-09-08 – 2013-09-09 (×2): 100 mg via ORAL
  Filled 2013-09-07 (×2): qty 2

## 2013-09-07 MED ORDER — ACETAMINOPHEN 500 MG PO TABS: 1000.0000 mg | ORAL_TABLET | Freq: Four times a day (QID) | ORAL | Status: DC | PRN

## 2013-09-07 MED ORDER — SODIUM CHLORIDE 0.9 % IV SOLN
INTRAVENOUS | Status: DC
  Administered 2013-09-07: 19:00:00 via INTRAVENOUS

## 2013-09-07 MED ORDER — SODIUM CHLORIDE 0.9 % IJ SOLN: 3.0000 mL | Freq: Two times a day (BID) | INTRAMUSCULAR | Status: DC

## 2013-09-07 MED ORDER — GLYBURIDE-METFORMIN 5-500 MG PO TABS: 1.0000 | ORAL_TABLET | Freq: Every evening | ORAL | Status: DC

## 2013-09-07 MED ORDER — CO Q 10 100 MG PO CAPS: 1.0000 | ORAL_CAPSULE | Freq: Every day | ORAL | Status: DC

## 2013-09-07 MED ORDER — ENOXAPARIN SODIUM 100 MG/ML ~~LOC~~ SOLN
90.0000 mg | Freq: Two times a day (BID) | SUBCUTANEOUS | Status: DC
  Administered 2013-09-07 – 2013-09-08 (×3): 90 mg via SUBCUTANEOUS
  Filled 2013-09-07 (×3): qty 1

## 2013-09-07 MED ORDER — GLYBURIDE 5 MG PO TABS
5.0000 mg | ORAL_TABLET | Freq: Every evening | ORAL | Status: DC
  Administered 2013-09-07 – 2013-09-08 (×2): 5 mg via ORAL
  Filled 2013-09-07 (×2): qty 1

## 2013-09-07 MED ORDER — METOPROLOL SUCCINATE ER 50 MG PO TB24
50.0000 mg | ORAL_TABLET | Freq: Every day | ORAL | Status: DC
  Administered 2013-09-07: 50 mg via ORAL
  Filled 2013-09-07: qty 1

## 2013-09-07 MED ORDER — PANTOPRAZOLE SODIUM 40 MG PO TBEC: 40.0000 mg | DELAYED_RELEASE_TABLET | Freq: Every day | ORAL | Status: DC

## 2013-09-07 MED ORDER — ISOSORBIDE MONONITRATE ER 60 MG PO TB24
30.0000 mg | ORAL_TABLET | Freq: Every day | ORAL | Status: DC
  Administered 2013-09-07: 30 mg via ORAL
  Filled 2013-09-07: qty 1

## 2013-09-07 MED ORDER — ONDANSETRON HCL 4 MG PO TABS: 4.0000 mg | ORAL_TABLET | Freq: Four times a day (QID) | ORAL | Status: DC | PRN

## 2013-09-07 MED ORDER — ASPIRIN 81 MG PO CHEW
324.0000 mg | CHEWABLE_TABLET | Freq: Once | ORAL | Status: AC
  Administered 2013-09-07: 324 mg via ORAL
  Filled 2013-09-07: qty 4

## 2013-09-07 MED ORDER — RAMIPRIL 2.5 MG PO CAPS
5.0000 mg | ORAL_CAPSULE | Freq: Every day | ORAL | Status: DC
  Administered 2013-09-07: 5 mg via ORAL
  Filled 2013-09-07: qty 2

## 2013-09-07 MED ORDER — ASPIRIN EC 325 MG PO TBEC: 325.0000 mg | DELAYED_RELEASE_TABLET | Freq: Every day | ORAL | Status: DC

## 2013-09-07 MED ORDER — METFORMIN HCL 500 MG PO TABS
500.0000 mg | ORAL_TABLET | Freq: Every evening | ORAL | Status: DC
  Administered 2013-09-07 – 2013-09-08 (×2): 500 mg via ORAL
  Filled 2013-09-07 (×2): qty 1

## 2013-09-07 MED ORDER — MORPHINE SULFATE 2 MG/ML IJ SOLN
1.0000 mg | INTRAMUSCULAR | Status: DC | PRN
  Administered 2013-09-07 – 2013-09-08 (×3): 1 mg via INTRAVENOUS
  Filled 2013-09-07 (×4): qty 1

## 2013-09-07 MED ORDER — VITAMIN B-12 1000 MCG PO TABS
1000.0000 ug | ORAL_TABLET | Freq: Every day | ORAL | Status: DC
  Administered 2013-09-08 – 2013-09-09 (×2): 1000 ug via ORAL
  Filled 2013-09-07 (×2): qty 1

## 2013-09-07 MED ORDER — NITROGLYCERIN 0.4 MG SL SUBL: 0.4000 mg | SUBLINGUAL_TABLET | SUBLINGUAL | Status: DC | PRN

## 2013-09-07 MED ORDER — ONDANSETRON HCL 4 MG/2ML IJ SOLN: 4.0000 mg | Freq: Four times a day (QID) | INTRAMUSCULAR | Status: DC | PRN

## 2013-09-07 NOTE — Consult Note (Signed)
Primary cardiologist: Dr [name] Consulting cardiologist: Dr [name]  Clinical Summary [name] is a 68 y.o.male history of CAD, PAD with prior stenting, HTN, hyperlipidemia, and statin intolerance admitted with chest pain. Long history of CAD.  Prior cath Jan 2012 with BMS stent placed to OM, had an admission shortly after that with recurrent chest pain with repeat cath showing non-obstructive disease and patent stent. July 2012 admitted with chest pain once again, stress test indicated lateral ischemia, cath showed July 2012 showed 95% ISR of OM stent which was opened with a cutting balloon. Repeat admission Dec 2012 with chest pain, cath once again showed ISR of the OM that was restented with a DES.07/2012 MPI no evidence of ischemia  Presents with 3-4 day history of chest pain. Described as 5/10 tightness in midchest, can be associated with nausea and lightheadedness. Occurs at rest or with exertion. Lasts up to 30-40 minutes, sometimes worst with position, has improved with nitro before. Reports he remains physically active, continues to walk on his treadmill 3 miles daily at fast walking pace. Reports prior angina has felt like this before. He has been compliant with medcations including his ASA and plavix.  EKG shows normal sinus rhythm, cxr no acute process, trop neg x 1 Allergies  Allergen Reactions  . Propoxyphene N-Acetaminophen   . Statins Other (See Comments)    Severe muscle cramping/aching/pain  . Tape     Blisters  . Levaquin [Levofloxacin Hemihydrate] Rash  . Penicillins Rash    Medications Scheduled Medications: . aspirin  325 mg Oral Daily  . [START ON 09/08/2013] clopidogrel  75 mg Oral Q breakfast  . Co Q 10  1 capsule Oral Daily  . dextromethorphan-guaiFENesin  1 tablet Oral BID  . glyBURIDE-metformin  1 tablet Oral QPM  . isosorbide mononitrate  30 mg Oral Daily  . loratadine  10 mg Oral Daily  . metoprolol succinate  50 mg Oral Daily  .  pantoprazole  40 mg Oral Daily  . pyridOXINE  100 mg Oral Daily  . ramipril  5 mg Oral Daily  . sodium chloride  3 mL Intravenous Q12H  . vitamin B-12  1,000 mcg Oral Daily     Infusions: . sodium chloride       PRN Medications:  acetaminophen, ibuprofen, morphine injection, nitroGLYCERIN, ondansetron (ZOFRAN) IV, ondansetron   Past Medical History  Diagnosis Date  . Coronary artery disease     s/p multiple caths 2012, stenting  . Diabetes mellitus   . Hypertension   . CHF (congestive heart failure)   . GERD (gastroesophageal reflux disease)   . PVD (peripheral vascular disease)     s/p left leg stent  . Hyperlipidemia     Past Surgical History  Procedure Laterality Date  . Coronary stent placement  2012 X2  . Femoral artery stent    . 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 Colonoscopy  12/2009    Dr. Hampton Lucas    Family History  Problem Relation Age of Onset  . Colon cancer Neg Hx   . Liver disease Neg Hx   . Inflammatory bowel disease Neg Hx     Social History [name] reports that he has quit smoking. He does not have any smokeless tobacco history on file. [name] reports that he does not drink alcohol.  Review of Systems CONSTITUTIONAL: No weight loss, fever, chills, weakness  or fatigue.  HEENT: Eyes: No visual loss, blurred vision, double vision or yellow sclerae. No hearing loss, sneezing, congestion, runny nose or sore throat.  SKIN: No rash or itching.  CARDIOVASCULAR: per HPI RESPIRATORY: No shortness of breath, cough or sputum.  GASTROINTESTINAL: No anorexia, nausea, vomiting or diarrhea. No abdominal pain or blood.  GENITOURINARY: no polyuria, no dysuria NEUROLOGICAL: lightheadedness, dizziness MUSCULOSKELETAL: No muscle, back pain, joint pain or stiffness.  HEMATOLOGIC: No anemia, bleeding or bruising.  LYMPHATICS:  No enlarged nodes. No history of splenectomy.  PSYCHIATRIC: No history of depression or anxiety.      Physical Examination Blood pressure 125/64, pulse 54, temperature 97.7 F (36.5 C), temperature source Oral, resp. rate 20, SpO2 95.00%. No intake or output data in the 24 hours ending 09/07/13 1620  Cardiovascular: RRR, no m/r/g, no JVD  Respiratory:CTAB  GI: abdomen soft, NT, ND  MSK: extremities are warm, no edema  Neuro:A&Ox3, no focal deficits   Lab Results  Basic Metabolic Panel:  Recent Labs Lab 09/07/13 1303  NA 139  K 4.1  CL 103  CO2 27  GLUCOSE 144*  BUN 17  CREATININE 1.23  CALCIUM 9.3    Liver Function Tests: No results found for this basename: AST, ALT, ALKPHOS, BILITOT, PROT, ALBUMIN,  in the last 168 hours  CBC:  Recent Labs Lab 09/07/13 1303  WBC 6.7  NEUTROABS 4.2  HGB 13.6  HCT 39.6  MCV 95.2  PLT 162    Cardiac Enzymes:  Recent Labs Lab 09/07/13 1303  TROPONINI <0.30    BNP: No components found with this basename: POCBNP,    ECG: pending   Imaging Cath Dec 2012 Hemodynamics:  Central Aortic Pressure: 148/98 mmHg  LV Pressure / LV End diastolic Pressure: 563/14 mmHg; 18 mmHg  Left Ventriculogram:  LVEF: 55-60%  Wall motion: No focal abnormalities  Coronary Angiographic Data:  Dominance: Right, with extensive Left Posterolateral Circulation  Left Main: Large caliber, branches into LAD & LCx,angiographically normal  Left Anterior Descending (LAD): Large to moderate caliber, angiographically normal, several proximal and mid septal perforator trunks and 2 major diagonal branches  1st diagonal (D1): Moderate caliber, angiographically normal  2nd diagonal (D2): Moderate-small caliber, angiographically normal  Circumflex (LCx): Large caliber, 2 small proximal branches including an atrial branch, courses into the AV-Groove after moderate caliber OM1, then has several small-moderate caliber LPL branches. Minimal luminal  irregularities with the exception of OM1 and small LPL1.  1st obtuse marginal: Smalll-moderate caliber, just at proximal superior branch which is immediately proximal to the previously placed stent, there is a hazy ~80% defect the continues ~2/3 the way into the stent, up to a second small caliber inferior branch.  Right Coronary Artery: Moderate caliber, torutous with diffuse luminal irregularities including proximal 20-30% lesions. Bifurcates distally into small Right Posterior Descending Artery (RPDA) and Right Atrio-ventricular Groove Branch (RPAVB) that only elaborates 2 small RPL branches.  RPDA: Small caliber, angiographically normal  RPAVB-RPL: Small caliber, angiographically normal PERCUTANEOUS CORONARY INTERVENTION PROCEDURE  After reviewing the initial cineangiography images, the culprit lesion in the proximal OM1 was identified, and the decision was made to proceed with percutaneous coronary interventoin with direct stenting with a DES due to this being his second admission for restenosis of a BMS.  The 5Fr Sheath was exchanged over a wire for a 6Fr sheath.  A weight based bolus of IV Angiomax was administered and the drip was continued until completion of the procedure. Oral 344m Clopidogrel was administered prior to  the procedure.  The 6Fr Voda Left 3.5 Guide catheter was advanced over a J-wire and used to engage the right Coronary Artery. An ACT of > 200 Sec was confirmed prior to advancing the Guidewire.  Initially, a Prowater wire was advanced easily into the OM1 beyond the lesion, however, this did not provide adequate support to advance the stent into the branch vessel. I then used a PT2MS Medium weight wire as a second buddy wire. I was able to advance the stent over this wire most of the way into the OM1, unfortunately, the wire was pulled back while removing the Prowater wire --likelydue to the wires being twisted together. I the re-attempted to perform this step with the PT2MS as  the main wire and the Prowater as the buddy wire, but was not able to advance the stent. I then chose to switch to a [employer] wire (heavy weight) which was easily advanced into the OM1, and the stent passed easily. The position was verified angiogaphically in 2 views to ensure that the stent did not encroach on the main-stem LCx. The stent was then deployed, and the ballon was re-inflated for post-dilation. IC Nitroglycerin injections were administered prior to using the Sport wire and after stent deployment. Angiography in several views pre-and post wire removal showed excellent stent expansion and no evidence of dissection or perforation.  Lesion #1: Prox OM1 In-stent restenosis  Pre-PCI Stenosis: 80 % Post-PCI Stenosis: 0 %  TIMI 3 flow TIMI 3 flow  Guide Catheter: VL3.5 Guidewire: HT S'port  STENT: Promus DES 2.5 mm x 20 mm  1st Inflation: 12 Atm for 45 Sec  2nd Inflation: 16 Atm for 100 Sec -- final diameter: 2.38 mm  Post deployment angiography did not reveal evidence of dissection or perforation. Excellent stent expansion.  The sheath was removed in the Cath Lab with a TR band placed at 42m Air at 1326 hrs. Reverse Allen's test with plethysmography revealed non-occlusive hemostasis.  The patient was transported to the PACU in stable condition. The patient was stable before, during and following the procedure. Patient did tolerate procedure well. There were not complications.  EBL:<174m Medications:  Premedication: 67m267malium  Sedation: 6 mg IV Versed, 150 mcg IV Fentanyl  Contrast: 2267m49mnipaque  Radial Cocktail: 5 mg Verapamil, 400 mcg NTG, 2 ml 2% Lidocaine  IV Heparin: 5000 Units after sheath placement  Angiomax bolus & drip: (0.767mg68m 1.75 mg/kg/hr)  IC Nitroglycerin: 200mcg58m  Impression:  1. 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  2. Successful PCI of prox OM1 lesion with  Promus DES 2.25 mm x 20 mm (post dilated to 2.38 mm).  3. Preserved LV Function. EF ~55-60%.  Plan:  1. Monitor overnight, IV Nitroglycerin, as he may well have recoil spasm after expanding the previous stent.  2. Anticipate discharge in AM on current medication.  3. Dual Antiplatelet for at least 1 year. Consider checking P2Y12 level pre-discharge.   01/2011 Echo LVEF 55-65%, no WMAs, grade I diastolic dysfunction,   12/20179/4801o evidence of ischemia, good exercise function. Low risk  Impression/Recommendations  1. Chest pain - symptoms are very mixed for possible cardiac chest pain. No evidence of ACS by cardiac enzymes or EKG. He has a strong CAD history, primarily involving single vessel disease with problems with in-stent restenosis on muliple occasions of his OM stent.  - continue to cycle cardiac enzymes and troponins -  will make NPO tonight, depending on clinical course will decide in AM on possible need for ischemic evaluation - given high TIMI score in the setting of possible unstable angina, will start anticoagulation with lovenox 82m/kg bid     JCarlyle Lucas M.D., F.A.C.C.

## 2013-09-07 NOTE — Progress Notes (Signed)
PHARMACIST - PHYSICIAN ORDER COMMUNICATION  CONCERNING: P&T Medication Policy on Herbal Medications  DESCRIPTION:  This patient's order for:  Coenzyme Q10  has been noted.  This product(s) is classified as an "herbal" or natural product. Due to a lack of definitive safety studies or FDA approval, nonstandard manufacturing practices, plus the potential risk of unknown drug-drug interactions while on inpatient medications, the Pharmacy and Therapeutics Committee does not permit the use of "herbal" or natural products of this type within [facility].   ACTION TAKEN: The pharmacy department is unable to verify this order at this time and your patient has been informed of this safety policy. Please reevaluate patient's clinical condition at discharge and address if the herbal or natural product(s) should be resumed at that time.  Netta Cedars, PharmD, BCPS 09/07/2013@4 :24 PM

## 2013-09-07 NOTE — H&P (Addendum)
[facility] History and Physical  [name] [id number] DOB: October 16, 1945 DOA: 09/07/2013  Referring physician: ER physician PCP: Maggie Font, MD   Chief Complaint: chest pain  HPI:  68 year old male with past medical history of CAD s/p multiple cardiac interventions, stents, DM, hypertension, dyslipidemia who presented to [facility] ED 09/07/2013 with complaints of ongoing chest pain, intermittent for past few days prior to this admission. Pt described pain as sharp, 5/10 in intensity, at rest and relieved somewhat by nitroglycerin. Pt also reported some associated dizziness but no loss of consciousness. No palpitations, no shortness of breath. No fever, no chills, no cough. No abdominal pain, no nausea or vomiting. No constipation or diarrhea.  In ED, BP was 124/70, HR 51, Tmax 98.1 F and oxygen saturation 95% on room air. CXR did not reveal acute cardiopulmonary findings. Cardiac enzymes were WNL (1 set), no acute ischemic changes on 12 lead EKG. BNP was WNL. Blood work essentially unremarkable.  Assessment and Plan:  Principal Problem:   Chest pain - risk factors: CAD and previous stenting, DM, HTN, obesity - rule out ACS - cycle cardiac enzymes x 3 sets - obtain 2 D ECHO  - may require AC but will defer to cardio for recommendations  - may continue nitroglycerin for chest pain; also added morphine IV PRN every 4 hours for pain control - oxygen support via nasal canula to keep O2 saturation above 90% - continue aspirin and plavix Active Problems:   DM (diabetes mellitus) - check A1c - continue glyburide, metformin   HTN (hypertension) - continue imdur, metoprolol, ramipril - renal function WNL   GERD - continue protonix     Radiological Exams on Admission: Dg Chest Portable 1 View 09/07/2013   IMPRESSION: No acute cardiopulmonary disease.   Electronically Signed   By: Lajean Manes M.D.   On: 09/07/2013 13:30    Code Status: Full Family Communication: Pt at  bedside Disposition Plan: Admit for further evaluation  Leisa Lenz, MD  [facility] Pager [phone]  Review of Systems:  Constitutional: Negative for fever, chills and malaise/fatigue. Negative for diaphoresis.  HENT: Negative for hearing loss, ear pain, nosebleeds, congestion, sore throat, neck pain, tinnitus and ear discharge.   Eyes: Negative for blurred vision, double vision, photophobia, pain, discharge and redness.  Respiratory: Negative for cough, hemoptysis, sputum production, shortness of breath, wheezing and stridor.   Cardiovascular: positive for chest pain, no palpitations, orthopnea, claudication and leg swelling.  Gastrointestinal: Negative for nausea, vomiting and abdominal pain. Negative for heartburn, constipation, blood in stool and melena.  Genitourinary: Negative for dysuria, urgency, frequency, hematuria and flank pain.  Musculoskeletal: Negative for myalgias, back pain, joint pain and falls.  Skin: Negative for itching and rash.  Neurological: Negative for dizziness and weakness. Negative for tingling, tremors, sensory change, speech change, focal weakness, loss of consciousness and headaches.  Endo/Heme/Allergies: Negative for environmental allergies and polydipsia. Does not bruise/bleed easily.  Psychiatric/Behavioral: Negative for suicidal ideas. The patient is not nervous/anxious.      Past Medical History  Diagnosis Date  . Coronary artery disease     s/p multiple caths 2012, stenting  . Diabetes mellitus   . Hypertension   . CHF (congestive heart failure)   . GERD (gastroesophageal reflux disease)   . PVD (peripheral vascular disease)     s/p left leg stent  . Hyperlipidemia    Past Surgical History  Procedure Laterality Date  . Coronary stent placement  2012 X2  .  Femoral artery stent    . 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 Colonoscopy  12/2009    Dr. Hampton Abbot   Social History:  reports that he has quit smoking. He does not have any smokeless tobacco history on file. He reports that he does not drink alcohol or use illicit drugs.  Allergies  Allergen Reactions  . Propoxyphene N-Acetaminophen   . Statins Other (See Comments)    Severe muscle cramping/aching/pain  . Tape     Blisters  . Levaquin [Levofloxacin Hemihydrate] Rash  . Penicillins Rash    Family History  Problem Relation Age of Onset  . Colon cancer Neg Hx   . Liver disease Neg Hx   . Inflammatory bowel disease Neg Hx      Prior to Admission medications   Medication Sig Start Date End Date Taking? Authorizing Provider  acetaminophen (TYLENOL) 500 MG tablet Take 1,000 mg by mouth every 6 (six) hours as needed for mild pain.   Yes Historical Provider, MD  aspirin (ECOTRIN) 325 MG EC tablet Take 325 mg by mouth daily.   Yes Historical Provider, MD  clopidogrel (PLAVIX) 75 MG tablet TAKE ONE TABLET BY MOUTH ONCE DAILY. 06/09/13  Yes [name], MD  Coenzyme Q10 (CO Q 10) 100 MG CAPS Take 1 capsule by mouth daily.   Yes Historical Provider, MD  dexlansoprazole (DEXILANT) 60 MG capsule Take 60 mg by mouth 2 (two) times daily.  08/20/11  Yes Mahala Menghini, PA-C  dextromethorphan-guaiFENesin (MUCINEX DM) 30-600 MG per 12 hr tablet Take 1 tablet by mouth 2 (two) times daily.   Yes Historical Provider, MD  fexofenadine (ALLEGRA) 180 MG tablet Take 180 mg by mouth daily.     Yes Historical Provider, MD  glyBURIDE-metformin (GLUCOVANCE) 5-500 MG per tablet Take 1 tablet by mouth every evening.   07/20/11  Yes Luke K Kilroy, PA-C  ibuprofen (ADVIL,MOTRIN) 200 MG tablet Take 400 mg by mouth daily as needed. For pain    Yes Historical Provider, MD  isosorbide mononitrate (IMDUR) 30 MG 24 hr tablet TAKE 1 TABLET EVERY DAY. 08/18/13  Yes [name], MD  metoprolol succinate (TOPROL-XL) 50 MG 24 hr tablet TAKE ONE TABLET DAILY.  03/11/13  Yes [name], MD  nitroGLYCERIN (NITROSTAT) 0.4 MG SL tablet Place 1 tablet (0.4 mg total) under the tongue every 5 (five) minutes as needed. For chest pain 03/28/13  Yes [name], MD  pyridOXINE (VITAMIN B-6) 100 MG tablet Take 100 mg by mouth daily.   Yes Historical Provider, MD  ramipril (ALTACE) 5 MG capsule TAKE (1) CAPSULE BY MOUTH ONCE DAILY. 07/18/13  Yes [name], MD  vitamin B-12 (CYANOCOBALAMIN) 1000 MCG tablet Take 1,000 mcg by mouth daily.   Yes Historical Provider, MD   Physical Exam: Filed Vitals:   09/07/13 1335 09/07/13 1340 09/07/13 1342 09/07/13 1345  BP:  124/70 140/67 143/74  Pulse: 55 59 56 59  Temp: 97.7 F (36.5 C)     TempSrc: Oral     Resp:      SpO2:        Physical Exam  Constitutional: Appears well-developed and well-nourished. No distress.  HENT: Normocephalic. External right and left ear normal. Oropharynx is clear and moist.  Eyes: Conjunctivae and EOM are normal. PERRLA, no scleral icterus.  Neck: Normal ROM. Neck supple. No JVD. No tracheal deviation. No thyromegaly.  CVS: RRR, S1/S2 +, no murmurs, no gallops, no carotid bruit.  Pulmonary: Effort and breath sounds normal, no stridor, rhonchi, wheezes, rales.  Abdominal: Soft. BS +,  no distension, tenderness, rebound or guarding.  Musculoskeletal: Normal range of motion. No edema and no tenderness.  Lymphadenopathy: No lymphadenopathy noted, cervical, inguinal. Neuro: Alert. Normal reflexes, muscle tone coordination. No cranial nerve deficit. Skin: Skin is warm and dry. No rash noted. Not diaphoretic. No erythema. No pallor.  Psychiatric: Normal mood and affect. Behavior, judgment, thought content normal.   Labs on Admission:  Basic Metabolic Panel:  Recent Labs Lab 09/07/13 1303  NA 139  K 4.1  CL 103  CO2 27  GLUCOSE 144*  BUN 17  CREATININE 1.23  CALCIUM 9.3   Liver Function Tests: No results found for this basename: AST, ALT, ALKPHOS, BILITOT, PROT,  ALBUMIN,  in the last 168 hours No results found for this basename: LIPASE, AMYLASE,  in the last 168 hours No results found for this basename: AMMONIA,  in the last 168 hours CBC:  Recent Labs Lab 09/07/13 1303  WBC 6.7  NEUTROABS 4.2  HGB 13.6  HCT 39.6  MCV 95.2  PLT 162   Cardiac Enzymes:  Recent Labs Lab 09/07/13 1303  TROPONINI <0.30   BNP: No components found with this basename: POCBNP,  CBG: No results found for this basename: GLUCAP,  in the last 168 hours  If 7PM-7AM, please contact night-coverage [email or web address] Password [facility] 09/07/2013, 2:59 PM

## 2013-09-07 NOTE — ED Notes (Signed)
Pt states he had one nitro subL tablet pta. Pt states pain decreased from 5 to 2.

## 2013-09-07 NOTE — ED Provider Notes (Signed)
CSN: [id number]     Arrival date & time 09/07/13  1213 History  This chart was scribed for Shaune Pollack, MD by Rolanda Lundborg, ED Scribe. This patient was seen in room APA06/APA06 and the patient's care was started at 1:29 PM.    Chief Complaint  Patient presents with  . Chest Pain   Patient is a 68 y.o. male presenting with chest pain. The history is provided by the patient. No language interpreter was used.  Chest Pain Pain location:  L chest Pain radiates to:  Does not radiate Duration:  3 days Timing:  Intermittent Associated symptoms: dizziness and nausea   Associated symptoms: no cough, no fever, no shortness of breath and not vomiting    HPI Comments: LAREY HERTEL is a 68 y.o. male with a h/o DM who presents to the Emergency Department complaining of intermittent, non-radiating, left-sided CP onset 3-4 days ago with nausea, dizziness, lightheadedness, and blurred vision. Pt reports it feels like acid reflux but he does not get dizzy or nauseous with reflux. He describes the pain as crampy. He denies any pain currently. His last episode was 30 minutes ago. Pt states he had one nitro sublingual tablet today with some relief. He denies pain or swelling in the legs, h/o DVT or PE. He denies fevers, cough, SOB, vomiting, diarrhea. He is a former smoker, quit over 10 years ago. He last saw his PCP 3 months ago.   Last cardiology note from Dr. Gwenlyn Found August 2014 The patient is a 68 year old mildly overweight married African American male father of 3 who I last saw in the office 2 months ago. He has a history of PVOD status post left SFA, PTA and stenting by myself back in July of 2006 with subsequent improvement in his claudication and Dopplers. These were last done in April of last year and showed ABIs of greater than 1 bilaterally. His other problems include hypertension, hyperlipidemia, non-insulin-requiring diabetes and statin intolerance. I catheterized him August 28, 2010 and stented his  first OM branch with a bare metal stent. He was readmitted September 09, 2010 with recurrent chest pain and was re-cathed by Dr. Ellyn Hack via the right radial approach revealing a widely patent stent. He was admitted again June 29th through July 3rd with chest pain and ruled out for myocardial infarction. His stress test showed subtle lateral ischemia and cath performed by Dr. Claiborne Billings February 10, 2012 revealed 95% proximal in-stent restenosis within the OM stent which Dr. Claiborne Billings opened up with a cutting balloon. He had done well until December of last year when he was admitted again with chest pain. Dr. Ellyn Hack recathed him revealing again in-stent restenosis which was "restented" with a Promus drug-eluting stent. He was having chest pain when I saw him back in December, however, since that time he has had minimal chest pain and he does walk 3-5 miles a day. His most recent liver profile performed in December revealed a total cholesterol of 162, LDL of 103, and HDL of 37. Since I saw him 6 months ago he is asymptomatic. He specifically denies chest pain or shortness of breath.    Past Medical History  Diagnosis Date  . Coronary artery disease     s/p multiple caths 2012, stenting  . Diabetes mellitus   . Hypertension   . CHF (congestive heart failure)   . GERD (gastroesophageal reflux disease)   . PVD (peripheral vascular disease)     s/p left leg stent  . Hyperlipidemia  Past Surgical History  Procedure Laterality Date  . Coronary stent placement  2012 X2  . Femoral artery stent    . 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 Colonoscopy  12/2009    Dr. Hampton Abbot   Family History  Problem Relation Age of Onset  . Colon cancer Neg Hx   . Liver disease Neg Hx   . Inflammatory bowel disease Neg Hx    History  Substance Use Topics  . Smoking status: Former Smoker -- 62  years  . Smokeless tobacco: Not on file     Comment: quit 10 yrs ago  . Alcohol Use: No    Review of Systems  Constitutional: Negative for fever and chills.  Respiratory: Negative for cough and shortness of breath.   Cardiovascular: Positive for chest pain. Negative for leg swelling.  Gastrointestinal: Positive for nausea. Negative for vomiting and diarrhea.  Neurological: Positive for dizziness and light-headedness.  All other systems reviewed and are negative.    Allergies  Propoxyphene n-acetaminophen; Statins; Tape; Levaquin; and Penicillins  Home Medications   Current Outpatient Rx  Name  Route  Sig  Dispense  Refill  . acetaminophen (TYLENOL) 500 MG tablet   Oral   Take 1,000 mg by mouth every 6 (six) hours as needed for mild pain.         Marland Kitchen aspirin (ECOTRIN) 325 MG EC tablet   Oral   Take 325 mg by mouth daily.         . clopidogrel (PLAVIX) 75 MG tablet      TAKE ONE TABLET BY MOUTH ONCE DAILY.   30 tablet   6   . Coenzyme Q10 (CO Q 10) 100 MG CAPS   Oral   Take 1 capsule by mouth daily.         Marland Kitchen dexlansoprazole (DEXILANT) 60 MG capsule   Oral   Take 60 mg by mouth 2 (two) times daily.          Marland Kitchen dextromethorphan-guaiFENesin (MUCINEX DM) 30-600 MG per 12 hr tablet   Oral   Take 1 tablet by mouth 2 (two) times daily.         . fexofenadine (ALLEGRA) 180 MG tablet   Oral   Take 180 mg by mouth daily.           Marland Kitchen glyBURIDE-metformin (GLUCOVANCE) 5-500 MG per tablet   Oral   Take 1 tablet by mouth every evening.           Marland Kitchen ibuprofen (ADVIL,MOTRIN) 200 MG tablet   Oral   Take 400 mg by mouth daily as needed. For pain          . isosorbide mononitrate (IMDUR) 30 MG 24 hr tablet      TAKE 1 TABLET EVERY DAY.   30 tablet   7   . metoprolol succinate (TOPROL-XL) 50 MG 24 hr tablet      TAKE ONE TABLET DAILY.   30 tablet   6   . nitroGLYCERIN (NITROSTAT) 0.4 MG SL tablet   Sublingual   Place 1 tablet (0.4 mg total) under the  tongue every 5 (five) minutes as needed. For chest pain   25 tablet   11   . pyridOXINE (VITAMIN B-6) 100 MG tablet   Oral   Take 100 mg by mouth daily.         . ramipril (ALTACE) 5 MG capsule  TAKE (1) CAPSULE BY MOUTH ONCE DAILY.   30 capsule   6   . vitamin B-12 (CYANOCOBALAMIN) 1000 MCG tablet   Oral   Take 1,000 mcg by mouth daily.          BP 128/70  Pulse 58  Temp(Src) 98 F (36.7 C) (Oral)  Resp 18  SpO2 100% Physical Exam  Nursing note and vitals reviewed. Constitutional: He is oriented to person, place, and time. He appears well-developed and well-nourished.  HENT:  Head: Normocephalic and atraumatic.  Right Ear: External ear normal.  Left Ear: External ear normal.  Nose: Nose normal.  Mouth/Throat: Oropharynx is clear and moist.  Eyes: Conjunctivae and EOM are normal. Pupils are equal, round, and reactive to light.  Neck: Normal range of motion. Neck supple.  Cardiovascular: Normal rate, regular rhythm, normal heart sounds and intact distal pulses.   Pulmonary/Chest: Effort normal and breath sounds normal. No respiratory distress. He has no wheezes. He exhibits no tenderness.  Abdominal: Soft. Bowel sounds are normal. He exhibits no distension and no mass. There is no tenderness. There is no guarding.  Musculoskeletal: Normal range of motion.  Neurological: He is alert and oriented to person, place, and time. He has normal reflexes. He exhibits normal muscle tone. Coordination normal.  Skin: Skin is warm and dry.  Psychiatric: He has a normal mood and affect. His behavior is normal. Judgment and thought content normal.    ED Course  Procedures (including critical care time) Medications - No data to display  DIAGNOSTIC STUDIES: Oxygen Saturation is 100% on RA, normal by my interpretation.    COORDINATION OF CARE: 1:47 PM- Discussed treatment plan with pt. Pt agrees to plan.    Labs Review Labs Reviewed  CBC WITH DIFFERENTIAL - Abnormal;  Notable for the following:    RBC 4.16 (*)    All other components within normal limits  BASIC METABOLIC PANEL - Abnormal; Notable for the following:    Glucose, Bld 144 (*)    GFR calc non Af Amer 59 (*)    GFR calc Af Amer 69 (*)    All other components within normal limits  TROPONIN I   Imaging Review Dg Chest Portable 1 View  09/07/2013   CLINICAL DATA:  Chest pain and nausea.  EXAM: PORTABLE CHEST - 1 VIEW  COMPARISON:  08/03/2012  FINDINGS: Lungs are hyperexpanded. There is mild interstitial thickening that is stable. No consolidation or edema. No pleural effusion or pneumothorax.  Cardiac silhouette is normal in size. Normal mediastinal and hilar contours.  IMPRESSION: No acute cardiopulmonary disease.   Electronically Signed   By: Lajean Manes M.D.   On: 09/07/2013 13:30    EKG Interpretation    Date/Time:  Wednesday September 07 2013 12:30:55 EST Ventricular Rate:  52 PR Interval:  196 QRS Duration: 88 QT Interval:  416 QTC Calculation: 386 R Axis:   57 Text Interpretation:  Sinus bradycardia Otherwise normal ECG When compared with ECG of 03-Aug-2012 20:06, Vent. rate has decreased BY  29 BPM Confirmed by Randale Carvalho MD, Taitum Menton ([id number]) on 09/07/2013 1:30:23 PM            MDM  No diagnosis found. 68 y.o. Male with history of cad s/p multiple stents presents today with new onset chest pain for past 3-4 days.  No pain on my exam having relieved pte at home with nitro.  Patient without acute ischemic ekg changes and first troponin negative.  PMD is Dr. Berdine Addison and cardiologist  Dr. Gwenlyn Found.  Patient will require further evaluation of his unstable angina.    I personally performed the services described in this documentation, which was scribed in my presence. The recorded information has been reviewed and considered.   BNP and cardiology consult ordered per hospitalist request.   Discussed with Dr. Harl Bowie and he will see in consult   Shaune Pollack, MD 09/07/13 [phone]

## 2013-09-07 NOTE — ED Notes (Signed)
Pt c/o left side chest pain x3 days. Pt also reports dizziness and nausea. Denies SOB.

## 2013-09-08 ENCOUNTER — Inpatient Hospital Stay (HOSPITAL_COMMUNITY): Payer: Medicare Other

## 2013-09-08 ENCOUNTER — Encounter (HOSPITAL_COMMUNITY): Payer: Self-pay

## 2013-09-08 DIAGNOSIS — I517 Cardiomegaly: Secondary | ICD-10-CM

## 2013-09-08 DIAGNOSIS — R079 Chest pain, unspecified: Secondary | ICD-10-CM

## 2013-09-08 DIAGNOSIS — K219 Gastro-esophageal reflux disease without esophagitis: Secondary | ICD-10-CM

## 2013-09-08 HISTORY — PX: TRANSTHORACIC ECHOCARDIOGRAM: SHX275

## 2013-09-08 HISTORY — PX: NM MYOCAR PERF WALL MOTION: HXRAD629

## 2013-09-08 LAB — COMPREHENSIVE METABOLIC PANEL
ALBUMIN: 3.4 g/dL — AB (ref 3.5–5.2)
ALT: 17 U/L (ref 0–53)
AST: 21 U/L (ref 0–37)
Alkaline Phosphatase: 45 U/L (ref 39–117)
BUN: 14 mg/dL (ref 6–23)
CO2: 28 mEq/L (ref 19–32)
Calcium: 9.2 mg/dL (ref 8.4–10.5)
Chloride: 105 mEq/L (ref 96–112)
Creatinine, Ser: 1.23 mg/dL (ref 0.50–1.35)
GFR calc Af Amer: 69 mL/min — ABNORMAL LOW (ref >90)
GFR calc non Af Amer: 59 mL/min — ABNORMAL LOW (ref >90)
Glucose, Bld: 63 mg/dL — ABNORMAL LOW (ref 70–99)
POTASSIUM: 3.7 meq/L (ref 3.7–5.3)
Sodium: 141 mEq/L (ref 137–147)
Total Bilirubin: 0.9 mg/dL (ref 0.3–1.2)
Total Protein: 6.6 g/dL (ref 6.0–8.3)

## 2013-09-08 LAB — CBC
HCT: 39.2 % (ref 39.0–52.0)
Hemoglobin: 13.5 g/dL (ref 13.0–17.0)
MCH: 32.8 pg (ref 26.0–34.0)
MCHC: 34.4 g/dL (ref 30.0–36.0)
MCV: 95.4 fL (ref 78.0–100.0)
Platelets: 162 10*3/uL (ref 150–400)
RBC: 4.11 MIL/uL — AB (ref 4.22–5.81)
RDW: 13.2 % (ref 11.5–15.5)
WBC: 5.7 10*3/uL (ref 4.0–10.5)

## 2013-09-08 LAB — GLUCOSE, CAPILLARY
Glucose-Capillary: 76 mg/dL (ref 70–99)
Glucose-Capillary: 84 mg/dL (ref 70–99)

## 2013-09-08 LAB — HEMOGLOBIN A1C
Hgb A1c MFr Bld: 6.1 % — ABNORMAL HIGH (ref <5.7)
MEAN PLASMA GLUCOSE: 128 mg/dL — AB (ref <117)

## 2013-09-08 LAB — TROPONIN I

## 2013-09-08 MED ORDER — ASPIRIN EC 325 MG PO TBEC: 325.0000 mg | DELAYED_RELEASE_TABLET | ORAL | Status: DC

## 2013-09-08 MED ORDER — TECHNETIUM TC 99M SESTAMIBI GENERIC - CARDIOLITE
10.0000 | Freq: Once | INTRAVENOUS | Status: AC | PRN
  Administered 2013-09-08: 10 via INTRAVENOUS

## 2013-09-08 MED ORDER — RANOLAZINE ER 500 MG PO TB12
ORAL_TABLET | ORAL | Status: AC
  Filled 2013-09-08: qty 1

## 2013-09-08 MED ORDER — SODIUM CHLORIDE 0.9 % IJ SOLN
INTRAMUSCULAR | Status: AC
  Administered 2013-09-08: 10 mL via INTRAVENOUS
  Filled 2013-09-08: qty 10

## 2013-09-08 MED ORDER — PANTOPRAZOLE SODIUM 40 MG PO TBEC
40.0000 mg | DELAYED_RELEASE_TABLET | Freq: Every day | ORAL | Status: DC
  Administered 2013-09-08: 40 mg via ORAL
  Filled 2013-09-08: qty 1

## 2013-09-08 MED ORDER — RAMIPRIL 2.5 MG PO CAPS
5.0000 mg | ORAL_CAPSULE | Freq: Every day | ORAL | Status: DC
  Administered 2013-09-08: 5 mg via ORAL
  Filled 2013-09-08: qty 2

## 2013-09-08 MED ORDER — REGADENOSON 0.4 MG/5ML IV SOLN
INTRAVENOUS | Status: AC
  Administered 2013-09-08: 0.4 mg via INTRAVENOUS
  Filled 2013-09-08: qty 5

## 2013-09-08 MED ORDER — CLOPIDOGREL BISULFATE 75 MG PO TABS: 75.0000 mg | ORAL_TABLET | ORAL | Status: DC

## 2013-09-08 MED ORDER — CLOPIDOGREL BISULFATE 75 MG PO TABS
75.0000 mg | ORAL_TABLET | Freq: Every day | ORAL | Status: DC
  Administered 2013-09-08: 75 mg via ORAL
  Filled 2013-09-08: qty 1

## 2013-09-08 MED ORDER — ISOSORBIDE MONONITRATE ER 60 MG PO TB24
30.0000 mg | ORAL_TABLET | Freq: Every day | ORAL | Status: DC
  Administered 2013-09-08: 30 mg via ORAL
  Filled 2013-09-08: qty 1

## 2013-09-08 MED ORDER — METOPROLOL SUCCINATE ER 25 MG PO TB24: 25.0000 mg | ORAL_TABLET | Freq: Every day | ORAL | Status: DC

## 2013-09-08 MED ORDER — REGADENOSON 0.4 MG/5ML IV SOLN
0.4000 mg | Freq: Once | INTRAVENOUS | Status: DC
  Filled 2013-09-08: qty 5

## 2013-09-08 MED ORDER — TECHNETIUM TC 99M SESTAMIBI - CARDIOLITE
30.0000 | Freq: Once | INTRAVENOUS | Status: AC | PRN
  Administered 2013-09-08: 30 via INTRAVENOUS

## 2013-09-08 MED ORDER — RANOLAZINE ER 500 MG PO TB12
500.0000 mg | ORAL_TABLET | Freq: Two times a day (BID) | ORAL | Status: DC
  Administered 2013-09-08 – 2013-09-09 (×2): 500 mg via ORAL
  Filled 2013-09-08 (×8): qty 1

## 2013-09-08 MED ORDER — ASPIRIN EC 325 MG PO TBEC
325.0000 mg | DELAYED_RELEASE_TABLET | Freq: Every day | ORAL | Status: DC
  Administered 2013-09-08: 325 mg via ORAL
  Filled 2013-09-08: qty 1

## 2013-09-08 NOTE — Progress Notes (Signed)
PT Cancellation Note  Patient Details Name: [name] MRN: [id number] DOB: 01/08/1946   Cancelled Treatment:    Reason Eval/Treat Not Completed: PT screened, no needs identified, will sign off   Sable Feil 09/08/2013, 11:11 AM

## 2013-09-08 NOTE — Progress Notes (Signed)
Stress reviewed, low to intermediate risk study. Evidence of inferoseptal defect that may be due to artifact, cannot rule out ischemia. Continue medical therapy, will start ranexa 500mg  bid, likely discharge tomorrow if no recurrent pain with follow up with his primary cardiologist Dr Gwenlyn Found.   Carlyle Dolly MD

## 2013-09-08 NOTE — Progress Notes (Signed)
Stress Lab Nurses Notes - [name]  [name] 09/08/2013 Reason for doing test: CAD and Chest Pain Type of test: Wille Glaser Nurse performing test: Gerrit Halls, RN Nuclear Medicine Tech: Melburn Hake Echo Tech: Not Applicable MD performing test: Dr. Lynann Beaver.Lawrence NP Family MD: Dr. Berdine Addison Test explained and consent signed: yes IV started: Saline lock flushed, No redness or edema and Saline lock from floor Symptoms: stomach discomfort Treatment/Intervention: None Reason test stopped: protocol completed After recovery IV was: No redness or edema and Saline Lock flushed Patient to return to [place]. Med at : 13:40 Patient discharged: Transported back to room 326 via wc Patient's Condition upon discharge was: stable Comments: During test BP 131/69 & HR 97.  Recovery BP 135/70 & HR 75.  Symptoms resolved in recovery.  [name]

## 2013-09-08 NOTE — Progress Notes (Signed)
Inpatient Diabetes Program Recommendations  AACE/ADA: New Consensus Statement on Inpatient Glycemic Control (2013)  Target Ranges:  Prepandial:   less than 140 mg/dL      Peak postprandial:   less than 180 mg/dL (1-2 hours)      Critically ill patients:  140 - 180 mg/dL   Results for [name], [name] (MRN [id number]) as of 09/08/2013 09:12  Ref. Range 09/07/2013 13:03 09/07/2013 13:03 09/08/2013 03:10  Glucose Latest Range: 70-99 mg/dL 146 (H) 144 (H) 63 (L)  Results for [name], [name] (MRN [id number]) as of 09/08/2013 09:12  Ref. Range 09/08/2013 07:58  Glucose-Capillary Latest Range: 70-99 mg/dL 76    Diabetes history: DM2 Outpatient Diabetes medications: Glucovance 5-500 mg every evening  Current orders for Inpatient glycemic control: Metformin 500 mg Qevening and Glyburide 5 mg Qevening  Inpatient Diabetes Program Recommendations Correction (SSI): Please order CBGs with Novolog sensitive correction ACHS. Oral Agents: Please discontinue Glyburide while inpatient especially since patient is NPO at this time in order to prevent further hypoglycemic episodes. HgbA1C: Noted A1C has been ordered and is pending.  Thanks, Barnie Alderman, RN, MSN, CCRN Diabetes Coordinator Inpatient Diabetes Program [phone] (Team Pager) [phone] ([facility] office) [phone] [facility] office)

## 2013-09-08 NOTE — Progress Notes (Signed)
Patient taken down for stress test at this time. 

## 2013-09-08 NOTE — Progress Notes (Signed)
UR completed 

## 2013-09-08 NOTE — Progress Notes (Signed)
[facility] PROGRESS NOTE  [name] WUX:[id number] DOB: [birth year] DOA: 09/07/2013 PCP: Maggie Font, MD  Brief narrative: 68 year old male with past medical history of CAD s/p multiple cardiac interventions, stents, DM, hypertension, dyslipidemia who presented to [facility] ED 09/07/2013 with complaints of ongoing chest pain, intermittent for past few days prior to this admission. In ED, BP was 124/70, HR 51, Tmax 98.1 F and oxygen saturation 95% on room air. CXR did not reveal acute cardiopulmonary findings. Cardiac enzymes were WNL (1 set), no acute ischemic changes on 12 lead EKG. BNP was WNL. Blood work essentially unremarkable.   Assessment and Plan:   Principal Problem:  Chest pain  - risk factors: CAD and previous stenting, DM, HTN, obesity  - cardiac enzymes WNL so far - appreciate cardio following - follow up 2 D ECHO results - Lovenox per pahrmacy - may require AC but will defer to cardio for recommendations  - continue nitro and morphine IV PRN every 4 hours for pain control  - oxygen support via nasal canula to keep O2 saturation above 90%  - continue aspirin and plavix  Active Problems:  DM (diabetes mellitus)  - A1c - pending   - continue glyburide, metformin  HTN (hypertension)  - continue imdur, metoprolol, ramipril  - renal function WNL  GERD  - continue protonix   Code Status: Full  Family Communication: No family at the bedside  Disposition Plan: home when stable    Leisa Lenz, MD  [facility] Pager [phone]  If 7PM-7AM, please contact night-coverage [email or web address] Password [email or web address] 09/08/2013, 2:21 PM   LOS: 1 day   Consultants:  Cardiology   Procedures:  None   Antibiotics:  None   HPI/Subjective: Still with intermittent chest pain.  Objective: Filed Vitals:   09/07/13 1622 09/07/13 2032 09/08/13 0500 09/08/13 0543  BP: 124/64 124/69  114/64  Pulse: 51 51  50  Temp: 98.1 F (36.7 C) 97.3 F (36.3 C)  97.9 F (36.6 C)   TempSrc: Oral Oral  Oral  Resp: 20 20  20   Height: 6' (1.829 m)     Weight: 94 kg (207 lb 3.7 oz)  94 kg (207 lb 3.7 oz)   SpO2: 96% 96%  97%    Intake/Output Summary (Last 24 hours) at 09/08/13 1421 Last data filed at 09/07/13 1800  Gross per 24 hour  Intake    240 ml  Output      0 ml  Net    240 ml    Exam:   General:  Pt is alert, follows commands appropriately, not in acute distress  Cardiovascular: Regular rate and rhythm, S1/S2, no murmurs, no rubs, no gallops  Respiratory: Clear to auscultation bilaterally, no wheezing, no crackles, no rhonchi  Abdomen: Soft, non tender, non distended, bowel sounds present, no guarding  Extremities: No edema, pulses DP and PT palpable bilaterally  Neuro: Grossly nonfocal  Data Reviewed: Basic Metabolic Panel:  Recent Labs Lab 09/07/13 1303 09/08/13 0310  NA 139  138 141  K 4.1  4.2 3.7  CL 103  102 105  CO2 27  24 28   GLUCOSE 144*  146* 63*  BUN 17  17 14   CREATININE 1.23  1.21 1.23  CALCIUM 9.3  9.2 9.2  MG 1.9  --   PHOS 2.4  --    Liver Function Tests:  Recent Labs Lab 09/07/13 1303 09/08/13 0310  AST 26 21  ALT 19 17  ALKPHOS 53 45  BILITOT  0.9 0.9  PROT 7.3 6.6  ALBUMIN 3.6 3.4*   No results found for this basename: LIPASE, AMYLASE,  in the last 168 hours No results found for this basename: AMMONIA,  in the last 168 hours CBC:  Recent Labs Lab 09/07/13 1303 09/08/13 0310  WBC 6.7 5.7  NEUTROABS 4.2  --   HGB 13.6 13.5  HCT 39.6 39.2  MCV 95.2 95.4  PLT 162 162   Cardiac Enzymes:  Recent Labs Lab 09/07/13 1303 09/07/13 1542 09/07/13 2115 09/08/13 0310  TROPONINI <0.30 <0.30 <0.30 <0.30   BNP: No components found with this basename: POCBNP,  CBG:  Recent Labs Lab 09/08/13 0758  GLUCAP 76    No results found for this or any previous visit (from the past 240 hour(s)).   Studies: Dg Chest Portable 1 View  09/07/2013   CLINICAL DATA:  Chest pain and nausea.  EXAM:  PORTABLE CHEST - 1 VIEW  COMPARISON:  08/03/2012  FINDINGS: Lungs are hyperexpanded. There is mild interstitial thickening that is stable. No consolidation or edema. No pleural effusion or pneumothorax.  Cardiac silhouette is normal in size. Normal mediastinal and hilar contours.  IMPRESSION: No acute cardiopulmonary disease.   Electronically Signed   By: Lajean Manes M.D.   On: 09/07/2013 13:30    Scheduled Meds: . aspirin  325 mg Oral q1800  . clopidogrel  75 mg Oral q1800  . dextromethorphan-guaiFENesin  1 tablet Oral BID  . enoxaparin  90 mg Subcutaneous Q12H  . glyBURIDE  5 mg Oral QPM   And  . metFORMIN  500 mg Oral QPM  . isosorbide mononitrate  30 mg Oral q1800  . loratadine  10 mg Oral Daily  . pantoprazole  40 mg Oral q1800  . pyridOXINE  100 mg Oral Daily  . ramipril  5 mg Oral q1800  . [START ON 09/09/2013] regadenoson  0.4 mg Intravenous Once  . sodium chloride  3 mL Intravenous Q12H  . vitamin B-12  1,000 mcg Oral Daily   Continuous Infusions: . sodium chloride 20 mL/hr at 09/07/13 5916

## 2013-09-08 NOTE — Progress Notes (Signed)
*  PRELIMINARY RESULTS* Echocardiogram 2D Echocardiogram has been performed.  [place], [place] 09/08/2013, 5:09 PM

## 2013-09-08 NOTE — Progress Notes (Signed)
Patient ID: [name], male   DOB: 1946/02/23, 68 y.o.   MRN: [id number]     Subjective:   Mild transient pain this morning that has now resolved.    Objective:   Temp:  [97.3 F (36.3 C)-98.1 F (36.7 C)] 97.9 F (36.6 C) (01/29 0543) Pulse Rate:  [50-59] 50 (01/29 0543) Resp:  [18-20] 20 (01/29 0543) BP: (114-143)/(64-74) 114/64 mmHg (01/29 0543) SpO2:  [95 %-100 %] 97 % (01/29 0543) Weight:  [207 lb 3.7 oz (94 kg)] 207 lb 3.7 oz (94 kg) (01/29 0500) Last BM Date: 09/06/13  Filed Weights   09/07/13 1622 09/08/13 0500  Weight: 207 lb 3.7 oz (94 kg) 207 lb 3.7 oz (94 kg)    Intake/Output Summary (Last 24 hours) at 09/08/13 1019 Last data filed at 09/07/13 1800  Gross per 24 hour  Intake    240 ml  Output      0 ml  Net    240 ml    Telemetry: sinus bradycardia  Exam:  General: NAD  Resp: CTAB  Cardiac: regular, brady   GI: abdomen soft, NT, ND  MSK: no LE edema  Neuro: no focal deficits   Lab Results:  Basic Metabolic Panel:  Recent Labs Lab 09/07/13 1303 09/08/13 0310  NA 139  138 141  K 4.1  4.2 3.7  CL 103  102 105  CO2 27  24 28   GLUCOSE 144*  146* 63*  BUN 17  17 14   CREATININE 1.23  1.21 1.23  CALCIUM 9.3  9.2 9.2  MG 1.9  --     Liver Function Tests:  Recent Labs Lab 09/07/13 1303 09/08/13 0310  AST 26 21  ALT 19 17  ALKPHOS 53 45  BILITOT 0.9 0.9  PROT 7.3 6.6  ALBUMIN 3.6 3.4*    CBC:  Recent Labs Lab 09/07/13 1303 09/08/13 0310  WBC 6.7 5.7  HGB 13.6 13.5  HCT 39.6 39.2  MCV 95.2 95.4  PLT 162 162    Cardiac Enzymes:  Recent Labs Lab 09/07/13 1542 09/07/13 2115 09/08/13 0310  TROPONINI <0.30 <0.30 <0.30    BNP:  Recent Labs  09/07/13 1303  PROBNP 42.4    Coagulation:  Recent Labs Lab 09/07/13 1303  INR 1.08    ECG:   Medications:   Scheduled Medications: . aspirin  325 mg Oral q1800  . clopidogrel  75 mg Oral q1800  . dextromethorphan-guaiFENesin  1 tablet Oral BID    . enoxaparin  90 mg Subcutaneous Q12H  . glyBURIDE  5 mg Oral QPM   And  . metFORMIN  500 mg Oral QPM  . isosorbide mononitrate  30 mg Oral Daily  . loratadine  10 mg Oral Daily  . pantoprazole  40 mg Oral Daily  . pyridOXINE  100 mg Oral Daily  . ramipril  5 mg Oral Daily  . [START ON 09/09/2013] regadenoson  0.4 mg Intravenous Once  . sodium chloride  3 mL Intravenous Q12H  . vitamin B-12  1,000 mcg Oral Daily     Infusions: . sodium chloride 20 mL/hr at 09/07/13 1852     PRN Medications:  acetaminophen, ibuprofen, morphine injection, nitroGLYCERIN, ondansetron (ZOFRAN) IV, ondansetron     Assessment/Plan    1. Chest pain  - symptoms are very mixed for possible cardiac chest pain. No evidence of ACS by cardiac enzymes or EKG. He has a strong CAD history, primarily involving single vessel disease with problems with in-stent restenosis on  muliple occasions of his OM stent.  - troponins remain negative - given high TIMI score in the setting of possible unstable angina, he is on anticoagulation with lovenox 1mg /kg bid  - follow up lexiscan - pt with sinus bradycardia, will hold beta blocker for the time being. Likely discharge on Toprol XL  25mg  daily. He is not on statin due to intolerance.       Carlyle Dolly, M.D., F.A.C.C.

## 2013-09-09 DIAGNOSIS — I10 Essential (primary) hypertension: Secondary | ICD-10-CM

## 2013-09-09 MED ORDER — METOPROLOL SUCCINATE ER 25 MG PO TB24
12.5000 mg | ORAL_TABLET | Freq: Every day | ORAL | Status: DC
  Administered 2013-09-09: 12.5 mg via ORAL
  Filled 2013-09-09: qty 1

## 2013-09-09 MED ORDER — RANOLAZINE ER 500 MG PO TB12: 500.0000 mg | ORAL_TABLET | Freq: Two times a day (BID) | ORAL | Status: DC

## 2013-09-09 MED ORDER — METOPROLOL SUCCINATE ER 25 MG PO TB24: 12.5000 mg | ORAL_TABLET | Freq: Every day | ORAL | Status: DC

## 2013-09-09 MED ORDER — METOPROLOL SUCCINATE ER 25 MG PO TB24: 25.0000 mg | ORAL_TABLET | Freq: Every day | ORAL | Status: DC

## 2013-09-09 MED ORDER — METOPROLOL SUCCINATE 12.5 MG HALF TABLET: 12.5000 mg | ORAL_TABLET | Freq: Every day | ORAL | Status: DC

## 2013-09-09 NOTE — Progress Notes (Signed)
Stress test shows questionable inferoseptal artifact vs infarct with mild-moderate peri-infarct ischemia as described in the report. There are no high risk features on his stress test. At this time recommend continue medical management, started on ranexa as additional antianginal. We have decreased his Toprol XL to 12.5mg  daily as he has had some noted sinus brady in the 50s during admission and noted some dizziness at times at home. He has follow up with his primary cardiologist 09/27/13, at that time can reassess symptoms. Cardiology will sign off of inpatient care, call with questions.     Carlyle Dolly MD

## 2013-09-09 NOTE — Progress Notes (Signed)
AVS reviewed with patient.  Patient verbalized understanding of discharge instructions, medications, new dosing of Metoprolol 12.5 mg daily and Ranexa and physician follow-up (PCP and Cardiologist).  Patient's IV removed.  Site WNL.  Reports all belongings intact and in possession at time of discharge.  Patient stable awaiting wheelchair for discharge.

## 2013-09-09 NOTE — Progress Notes (Signed)
Follow-up appointment with Dr. Iona Beard on Friday, 09/23/2013 at 11 a.m.

## 2013-09-09 NOTE — Discharge Summary (Signed)
Physician Discharge Summary  [name] XTG:[id number] DOB: 1945-09-12 DOA: 09/07/2013  PCP: Maggie Font, MD  Admit date: 09/07/2013 Discharge date: 09/09/2013  Time spent: 35 minutes  Recommendations for Outpatient Follow-up:  1. Follow up with Dr. Gwenlyn Found as scheduled  Discharge Diagnoses:  Principal Problem:   Chest pain Active Problems:   PVD   GASTROESOPHAGEAL REFLUX DISEASE, SEVERE   CAD ISR OM1 Rx'd with DES this admission   Stented coronary artery   DM (diabetes mellitus)   Dyslipidemia   HTN (hypertension)   Discharge Condition: improved  Diet recommendation: low salt, low carb  Filed Weights   09/07/13 1622 09/08/13 0500 09/09/13 0500  Weight: 94 kg (207 lb 3.7 oz) 94 kg (207 lb 3.7 oz) 94.1 kg (207 lb 7.3 oz)    History of present illness:  68 year old male with past medical history of CAD s/p multiple cardiac interventions, stents, DM, hypertension, dyslipidemia who presented to [facility] 09/07/2013 with complaints of ongoing chest pain, intermittent for past few days prior to this admission. Pt described pain as sharp, 5/10 in intensity, at rest and relieved somewhat by nitroglycerin. Pt also reported some associated dizziness but no loss of consciousness. No palpitations, no shortness of breath. No fever, no chills, no cough. No abdominal pain, no nausea or vomiting. No constipation or diarrhea.  In ED, BP was 124/70, HR 51, Tmax 98.1 F and oxygen saturation 95% on room air. CXR did not reveal acute cardiopulmonary findings. Cardiac enzymes were WNL (1 set), no acute ischemic changes on 12 lead EKG. BNP was WNL. Blood work essentially unremarkable.   Hospital Course:  This patient was admitted to the hospital with chest pain. He ruled out for ACS with negative cardiac markers. He was seen by cardiology and underwent echocardiogram as well as stress testing. Results indicated questionable inferoseptal artifact versus infarct with mild to moderate peri-infarct ischemia.  There were no high-risk features noted on his stress test. Recommendations were to continue medical management. He was started on Ranexa. His Toprol was decreased to 12.5 mg daily as he did have some sinus bradycardia with a heart rate in the 50s on admission and did report some dizziness at times at home. The patient is recommended to follow up with his cardiologist on 09/27/13 as previously scheduled. No further inpatient cardiac workup is recommended. He's been cleared for discharge by cardiology.  The patient does not have any chest pain or shortness of breath at this time.  Procedures: Echo: - Left ventricle: The cavity size was normal. Wall thickness was increased in a pattern of mild LVH. Systolic function was normal. The estimated ejection fraction was in the range of 50% to 55%. Probable hypokinesis of the midinferoseptal myocardium. Doppler parameters are consistent with abnormal left ventricular relaxation (grade 1 diastolic dysfunction). - Aortic valve: Mildly calcified annulus. Trileaflet. No significant regurgitation. - Mitral valve: Calcified annulus. - Left atrium: The atrium was mildly dilated. - Right atrium: Central venous pressure: 42mm Hg (est). - Atrial septum: No defect or patent foramen ovale was identified. - Tricuspid valve: Trivial regurgitation. - Pulmonary arteries: Systolic pressure could not be accurately estimated. - Pericardium, extracardiac: There was no pericardial effusion. Impressions:  - Mild LVH with LVEF 50-55%, probable mid inferoseptal hypokinesis, grade 1 diastolic dysfunction. MAC with trivial mitral regurgitation. Mild left atrial enlargement. Unable to assess PASP.    Consultations:  cardiology  Discharge Exam: Filed Vitals:   09/09/13 1004  BP:   Pulse: 61  Temp:  Resp:     General: NAD Cardiovascular: s1, s2 rrr Respiratory: CTA B  Discharge Instructions  Discharge Orders   Future Appointments Provider Department Dept  Phone   09/27/2013 10:20 AM Erlene Quan, PA-C [facility] [phone]   Future Orders Complete By Expires   Call MD for:  difficulty breathing, headache or visual disturbances  As directed    Call MD for:  severe uncontrolled pain  As directed    Call MD for:  temperature >100.4  As directed    Diet - low sodium heart healthy  As directed    Increase activity slowly  As directed        Medication List         acetaminophen 500 MG tablet  Commonly known as:  TYLENOL  Take 1,000 mg by mouth every 6 (six) hours as needed for mild pain.     clopidogrel 75 MG tablet  Commonly known as:  PLAVIX  TAKE ONE TABLET BY MOUTH ONCE DAILY.     Co Q 10 100 MG Caps  Take 1 capsule by mouth daily.     dexlansoprazole 60 MG capsule  Commonly known as:  DEXILANT  Take 60 mg by mouth 2 (two) times daily.     dextromethorphan-guaiFENesin 30-600 MG per 12 hr tablet  Commonly known as:  MUCINEX DM  Take 1 tablet by mouth 2 (two) times daily.     ECOTRIN 325 MG EC tablet  Generic drug:  aspirin  Take 325 mg by mouth daily.     fexofenadine 180 MG tablet  Commonly known as:  ALLEGRA  Take 180 mg by mouth daily.     glyBURIDE-metformin 5-500 MG per tablet  Commonly known as:  GLUCOVANCE  Take 1 tablet by mouth every evening.     ibuprofen 200 MG tablet  Commonly known as:  ADVIL,MOTRIN  Take 400 mg by mouth daily as needed. For pain     isosorbide mononitrate 30 MG 24 hr tablet  Commonly known as:  IMDUR  TAKE 1 TABLET EVERY DAY.     metoprolol succinate 12.5 mg Tb24 24 hr tablet  Commonly known as:  TOPROL-XL  Take 0.5 tablets (12.5 mg total) by mouth daily.     nitroGLYCERIN 0.4 MG SL tablet  Commonly known as:  NITROSTAT  Place 1 tablet (0.4 mg total) under the tongue every 5 (five) minutes as needed. For chest pain     pyridOXINE 100 MG tablet  Commonly known as:  VITAMIN B-6  Take 100 mg by mouth daily.     ramipril 5 MG capsule  Commonly known as:  ALTACE   TAKE (1) CAPSULE BY MOUTH ONCE DAILY.     ranolazine 500 MG 12 hr tablet  Commonly known as:  RANEXA  Take 1 tablet (500 mg total) by mouth 2 (two) times daily.     vitamin B-12 1000 MCG tablet  Commonly known as:  CYANOCOBALAMIN  Take 1,000 mcg by mouth daily.       Allergies  Allergen Reactions  . Propoxyphene N-Acetaminophen   . Statins Other (See Comments)    Severe muscle cramping/aching/pain  . Tape     Blisters  . Levaquin [Levofloxacin Hemihydrate] Rash  . Penicillins Rash       Follow-up Information   Follow up with HILL,GERALD K, MD. Schedule an appointment as soon as possible for a visit in 2 weeks.   Specialty:  Family Medicine   Contact information:  [place] [address] [phone]       Follow up with [name], MD. Schedule an appointment as soon as possible for a visit in 2 weeks.   Specialty:  Cardiology   Contact information:   [address] [place] [address] [phone]        The results of significant diagnostics from this hospitalization (including imaging, microbiology, ancillary and laboratory) are listed below for reference.    Significant Diagnostic Studies: Nm Myocar Single W/spect W/wall Motion And Ef  09/08/2013   ADDENDUM REPORT: 09/08/2013 17:59  ADDENDUM: Images and processing reviewed in further detail. There is significant subdiaphragmatic tracer uptake that is more prominent in the post-injection images than the resting images, with the most significant effect adjacent to the inferoseptal wall. This defect may represent artifact given the fact that is has normal wall motion, however cannot rule out possible mild to moderate ischemia of this area.   Electronically Signed   By: Carlyle Dolly   On: 09/08/2013 17:59   09/08/2013   CLINICAL DATA:  68 year old male with known history of coronary artery disease referred for chest pain.  TECHNIQUE: Standard myocardial SPECT imaging  was performed after resting intravenous injection of 10 mCi Tc-18m sestamibi. Subsequently, intravenous infusion of Lexiscan was performed under the supervision of the Cardiology staff. At peak effect of the drug, 30 mCi Tc-102m sestamibi was injected intravenously and standard myocardial SPECT imaging was performed. Quantitative gated imaging was also performed to evaluate left ventricular wall motion, and estimate left ventricular ejection fraction.  COMPARISON:  None.  FINDINGS: Pharmacological stress  Baseline EKG showed normal sinus rhythm. After injection, heart rate increased from 57 beats per min T 90 degrees 7 beats per min. Blood pressure increased from 126/80 to 135/70 mm of mercury. The test was stopped after injection was complete, the patient did not experience any chest pain. The post-injection EKG showed no evidence of ischemia and no significant arrhythmias.  Myocardial perfusion imaging  Raw images showed appropriate radiotracer uptake. There was a moderate-sized mildly reversible defect in the inferoseptal wall. There were no other perfusion defects.  Gated images showed end-diastolic volume [id number] mm, and systolic volume 54 mL, ejection fraction 58%. TID 0.97. There was normal wall motion.  IMPRESSION: 1.  Abnormal Lexiscan MPI for ischemia  2. Low to intermediate risk study based on area of myocardium at jeopardy  3.  Left ventricular function is normal with normal wall motion  EXAM:  87-YEAR-OLD MALE WITH A KNOWN HISTORY OF CORONARY ARTERY DISEASE REFERRED FOR CHEST PAIN.: EXAM: 69-YEAR-OLD MALE WITH A KNOWN HISTORY OF CORONARY ARTERY DISEASE REFERRED FOR CHEST PAIN. MYOCARDIAL IMAGING WITH SPECT (REST AND PHARMACOLOGIC-STRESS)  GATED LEFT VENTRICULAR WALL MOTION STUDY  LEFT VENTRICULAR EJECTION FRACTION  Electronically Signed: By: Carlyle Dolly On: 09/08/2013 17:42   Dg Chest Portable 1 View  09/07/2013   CLINICAL DATA:  Chest pain and nausea.  EXAM: PORTABLE CHEST - 1 VIEW  COMPARISON:   08/03/2012  FINDINGS: Lungs are hyperexpanded. There is mild interstitial thickening that is stable. No consolidation or edema. No pleural effusion or pneumothorax.  Cardiac silhouette is normal in size. Normal mediastinal and hilar contours.  IMPRESSION: No acute cardiopulmonary disease.   Electronically Signed   By: Lajean Manes M.D.   On: 09/07/2013 13:30    Microbiology: No results found for this or any previous visit (from the past 240 hour(s)).   Labs: Basic Metabolic Panel:  Recent Labs Lab 09/07/13 1303 09/08/13 0310  NA 139  138 141  K 4.1  4.2 3.7  CL 103  102 105  CO2 27  24 28   GLUCOSE 144*  146* 63*  BUN 17  17 14   CREATININE 1.23  1.21 1.23  CALCIUM 9.3  9.2 9.2  MG 1.9  --   PHOS 2.4  --    Liver Function Tests:  Recent Labs Lab 09/07/13 1303 09/08/13 0310  AST 26 21  ALT 19 17  ALKPHOS 53 45  BILITOT 0.9 0.9  PROT 7.3 6.6  ALBUMIN 3.6 3.4*   No results found for this basename: LIPASE, AMYLASE,  in the last 168 hours No results found for this basename: AMMONIA,  in the last 168 hours CBC:  Recent Labs Lab 09/07/13 1303 09/08/13 0310  WBC 6.7 5.7  NEUTROABS 4.2  --   HGB 13.6 13.5  HCT 39.6 39.2  MCV 95.2 95.4  PLT 162 162   Cardiac Enzymes:  Recent Labs Lab 09/07/13 1303 09/07/13 1542 09/07/13 2115 09/08/13 0310  TROPONINI <0.30 <0.30 <0.30 <0.30   BNP: BNP (last 3 results)  Recent Labs  09/07/13 1303  PROBNP 42.4   CBG:  Recent Labs Lab 09/08/13 0758 09/08/13 1731  GLUCAP 76 84       Signed:  Uva Runkel  [facility] 09/09/2013, 1:06 PM

## 2013-09-20 ENCOUNTER — Telehealth: Payer: Self-pay | Admitting: *Deleted

## 2013-09-20 NOTE — Telephone Encounter (Signed)
Message forwarded to K. Vogel, RN to discuss w/ Dr. Berry.   

## 2013-09-20 NOTE — Telephone Encounter (Signed)
Pt called stating that he is having issues with his Ranexa. Pt seeing Lurena Joiner on 2/17

## 2013-09-20 NOTE — Telephone Encounter (Signed)
Returned call and pt verified x 2.  Pt stated he was put on Ranexa a couple of weeks ago.  Pt c/o having trouble urinating and having dizziness.  Pt also c/o constipation.  Pt informed difficulty urinating should be evaluated by PCP, but other symptoms likely r/t Ranexa.  Pt confirmed he is taking Imdu 30 mg daily and has had to take one NTG daily since Saturday for CP.  Pt informed RN will discuss w/ MD and call him back.  In the meantime, pt agreed to get BP checked as he does not have a monitor at home.  Pt also agreed not to drive if having dizziness.  Message printed and placed on DOD (Dr. Claiborne Billings) cart to review and advise, as primary cardiologist is out of the office.

## 2013-09-20 NOTE — Telephone Encounter (Signed)
Please advise 

## 2013-09-21 NOTE — Telephone Encounter (Signed)
I reviewed with Dr Gwenlyn Found.  He advised that patient needs to be seen.   I spoke with patient.  He feels good today!  No dizziness.  No chest pain in the past two days. I offered patient an appt with Dr Gwenlyn Found 2-13, but patient refused.  He said that he was feeling much better and he is still taking the Ranexa.  He will keep his ROV and call back if symptoms return.

## 2013-09-27 ENCOUNTER — Ambulatory Visit: Payer: Medicare Other | Admitting: Cardiology

## 2013-10-06 ENCOUNTER — Encounter: Payer: Self-pay | Admitting: *Deleted

## 2013-10-07 ENCOUNTER — Ambulatory Visit: Payer: Medicare Other | Admitting: Cardiology

## 2013-10-10 ENCOUNTER — Encounter: Payer: Self-pay | Admitting: Cardiovascular Disease

## 2013-10-11 ENCOUNTER — Encounter: Payer: Self-pay | Admitting: Cardiovascular Disease

## 2013-10-11 ENCOUNTER — Ambulatory Visit (INDEPENDENT_AMBULATORY_CARE_PROVIDER_SITE_OTHER): Payer: Medicare Other | Admitting: Cardiovascular Disease

## 2013-10-11 ENCOUNTER — Ambulatory Visit: Payer: Medicare Other | Admitting: Physician Assistant

## 2013-10-11 VITALS — BP 142/80 | HR 61 | Ht 72.0 in | Wt 207.8 lb

## 2013-10-11 DIAGNOSIS — I2510 Atherosclerotic heart disease of native coronary artery without angina pectoris: Secondary | ICD-10-CM

## 2013-10-11 DIAGNOSIS — I739 Peripheral vascular disease, unspecified: Secondary | ICD-10-CM

## 2013-10-11 DIAGNOSIS — I10 Essential (primary) hypertension: Secondary | ICD-10-CM

## 2013-10-11 DIAGNOSIS — Z789 Other specified health status: Secondary | ICD-10-CM

## 2013-10-11 MED ORDER — RANOLAZINE ER 500 MG PO TB12: 500.0000 mg | ORAL_TABLET | Freq: Two times a day (BID) | ORAL | Status: DC

## 2013-10-11 NOTE — Patient Instructions (Signed)
Your physician wants you to follow-up in: 6 months with an extender and 1 year with Dr Berry. You will receive a reminder letter in the mail two months in advance. If you don't receive a letter, please call our office to schedule the follow-up appointment.  

## 2013-10-11 NOTE — Assessment & Plan Note (Signed)
Well-controlled on current medications 

## 2013-10-11 NOTE — Assessment & Plan Note (Signed)
That is post left SFA PTA and stenting by myself 04/30/05. We have been following his lower extremities by duplex ultrasound which were performed September of last year revealing ABI is greater than 1 bilaterally. Patent left SFA stent. He denies claudication.

## 2013-10-11 NOTE — Assessment & Plan Note (Signed)
Statin intolerant 

## 2013-10-11 NOTE — Progress Notes (Signed)
10/11/2013 [name]   09/01/1945  [id number]  Primary Physician Maggie Font, MD Primary Cardiologist: [name] MD [name]   HPI:  The patient is a 68 year old mildly overweight married [place] American male father of 3 who I last saw in the office 2 months ago. He has a history of PVOD status post left SFA, PTA and stenting by myself back in July of 2006 with subsequent improvement in his claudication and Dopplers. These were last done in April of last year and showed ABIs of greater than 1 bilaterally. His other problems include hypertension, hyperlipidemia, non-insulin-requiring diabetes and statin intolerance. I catheterized him August 28, 2010 and stented his first OM branch with a bare metal stent. He was readmitted September 09, 2010 with recurrent chest pain and was re-cathed by Dr. Ellyn Hack via the right radial approach revealing a widely patent stent. He was admitted again June 29th through July 3rd with chest pain and ruled out for myocardial infarction. His stress test showed subtle lateral ischemia and cath performed by Dr. Claiborne Billings February 10, 2012 revealed 95% proximal in-stent restenosis within the OM stent which Dr. Claiborne Billings opened up with a cutting balloon. He had done well until December of last year when he was admitted again with chest pain. Dr. Ellyn Hack recathed him revealing again in-stent restenosis which was "restented" with a Promus drug-eluting stent. He was having chest pain when I saw him back in December, however, since that time he has had minimal chest pain and he does walk 3-5 miles a day. His most recent liver profile performed 05/29/13 revealed a total cholesterol 162, LDL of 90 and HDL of 34. He was recently admitted with chest pain/rule out MI 08/30/13. This is either negative. 2-D echo was essentially unremarkable with mild inferoseptal hypokinesia and a Myoview stress test was read as low risk. His medications were adjusted. Ranexa was added..he has  had no recurrent chest pain. He also denies claudication.     Current Outpatient Prescriptions  Medication Sig Dispense Refill  . acetaminophen (TYLENOL) 500 MG tablet Take 1,000 mg by mouth every 6 (six) hours as needed for mild pain.      Marland Kitchen aspirin (ECOTRIN) 325 MG EC tablet Take 325 mg by mouth daily.      . clopidogrel (PLAVIX) 75 MG tablet TAKE ONE TABLET BY MOUTH ONCE DAILY.  30 tablet  6  . Coenzyme Q10 (CO Q 10) 100 MG CAPS Take 400 mg by mouth daily.       Marland Kitchen dexlansoprazole (DEXILANT) 60 MG capsule Take 60 mg by mouth 2 (two) times daily.       Marland Kitchen dextromethorphan-guaiFENesin (MUCINEX DM) 30-600 MG per 12 hr tablet Take 1 tablet by mouth 2 (two) times daily.      . fexofenadine (ALLEGRA) 180 MG tablet Take 180 mg by mouth daily.        Marland Kitchen glyBURIDE-metformin (GLUCOVANCE) 5-500 MG per tablet Take 1 tablet by mouth every evening.        Marland Kitchen ibuprofen (ADVIL,MOTRIN) 200 MG tablet Take 400 mg by mouth daily as needed. For pain       . isosorbide mononitrate (IMDUR) 30 MG 24 hr tablet TAKE 1 TABLET EVERY DAY.  30 tablet  7  . metoprolol succinate (TOPROL-XL) 25 MG 24 hr tablet Take 0.5 tablets (12.5 mg total) by mouth daily.  30 tablet  1  . nitroGLYCERIN (NITROSTAT) 0.4 MG SL tablet Place 1 tablet (0.4 mg total) under the  tongue every 5 (five) minutes as needed. For chest pain  25 tablet  11  . pyridOXINE (VITAMIN B-6) 100 MG tablet Take 100 mg by mouth daily.      . ramipril (ALTACE) 5 MG capsule TAKE (1) CAPSULE BY MOUTH ONCE DAILY.  30 capsule  6  . ranolazine (RANEXA) 500 MG 12 hr tablet Take 1 tablet (500 mg total) by mouth 2 (two) times daily.  60 tablet  1  . TOCOPHEROLS-TOCOTRIENOLS PO Take 1 tablet by mouth daily.      . vitamin B-12 (CYANOCOBALAMIN) 1000 MCG tablet Take 1,000 mcg by mouth daily.       No current facility-administered medications for this visit.    Allergies  Allergen Reactions  . Propoxyphene N-Acetaminophen   . Statins Other (See Comments)    Severe muscle  cramping/aching/pain  . Tape     Blisters  . Levaquin [Levofloxacin Hemihydrate] Rash  . Penicillins Rash    History   Social History  . Marital Status: Married    Spouse Name: N/A    Number of Children: 3  . Years of Education: N/A   Occupational History  . LAB TECH [employer]   Social History Main Topics  . Smoking status: Former Smoker -- 65 years  . Smokeless tobacco: Not on file     Comment: quit 10 yrs ago  . Alcohol Use: No  . Drug Use: No  . Sexual Activity: No   Other Topics Concern  . Not on file   Social History Narrative  . No narrative on file     Review of Systems: General: negative for chills, fever, night sweats or weight changes.  Cardiovascular: negative for chest pain, dyspnea on exertion, edema, orthopnea, palpitations, paroxysmal nocturnal dyspnea or shortness of breath Dermatological: negative for rash Respiratory: negative for cough or wheezing Urologic: negative for hematuria Abdominal: negative for nausea, vomiting, diarrhea, bright red blood per rectum, melena, or hematemesis Neurologic: negative for visual changes, syncope, or dizziness All other systems reviewed and are otherwise negative except as noted above.    Blood pressure 142/80, pulse 61, height 6' (1.829 m), weight 94.257 kg (207 lb 12.8 oz).  General appearance: alert and no distress Neck: no adenopathy, no carotid bruit, no JVD, supple, symmetrical, trachea midline and thyroid not enlarged, symmetric, no tenderness/mass/nodules Lungs: clear to auscultation bilaterally Heart: regular rate and rhythm, S1, S2 normal, no murmur, click, rub or gallop Extremities: extremities normal, atraumatic, no cyanosis or edema  EKG normal sinus rhythm at 61 without ST or T wave changes  ASSESSMENT AND PLAN:   CAD ISR OM1 Rx'd with DES this admission History of known CAD status post stenting of his first OM branch with a bare-metal stent in 2012. He's had multiple interventions  since with quick in-stent restenosis" really intervened on by doctors Georgina Peer and Ellyn Hack. He was recently admitted with chest pain 09/07/13 for 2 days. He ruled out for myocardial infarction. A 2-D echo trivial to mild inferior septal wall motion amount he had a Myoview stress test was read as low risk. He was treated medically and his medications were adjusted. Ranexa was added.he has had no recurrent chest pain.  PVD That is post left SFA PTA and stenting by myself 04/30/05. We have been following his lower extremities by duplex ultrasound which were performed September of last year revealing ABI is greater than 1 bilaterally. Patent left SFA stent. He denies claudication.  Statin intolerance Statin intolerant  HTN (hypertension) Well-controlled  on current medications      [name] MD [facility], [facility] 10/11/2013 9:38 AM

## 2013-10-11 NOTE — Assessment & Plan Note (Signed)
History of known CAD status post stenting of his first OM branch with a bare-metal stent in 2012. He's had multiple interventions since with quick in-stent restenosis" really intervened on by doctors Georgina Peer and Ellyn Hack. He was recently admitted with chest pain 09/07/13 for 2 days. He ruled out for myocardial infarction. A 2-D echo trivial to mild inferior septal wall motion amount he had a Myoview stress test was read as low risk. He was treated medically and his medications were adjusted. Ranexa was added.he has had no recurrent chest pain.

## 2013-11-16 ENCOUNTER — Other Ambulatory Visit: Payer: Self-pay | Admitting: Gastroenterology

## 2014-01-12 ENCOUNTER — Other Ambulatory Visit: Payer: Self-pay | Admitting: Cardiovascular Disease

## 2014-01-12 NOTE — Telephone Encounter (Signed)
Pt called and said he still have not gotten his Metoprolol. Please call to [place].

## 2014-01-12 NOTE — Telephone Encounter (Signed)
Rx was sent to pharmacy electronically. 

## 2014-02-08 ENCOUNTER — Other Ambulatory Visit: Payer: Self-pay | Admitting: Cardiovascular Disease

## 2014-02-08 NOTE — Telephone Encounter (Signed)
Rx was sent to pharmacy electronically. 

## 2014-02-16 ENCOUNTER — Other Ambulatory Visit: Payer: Self-pay | Admitting: Cardiovascular Disease

## 2014-02-16 NOTE — Telephone Encounter (Signed)
Rx was sent to pharmacy electronically. 

## 2014-03-18 ENCOUNTER — Other Ambulatory Visit: Payer: Self-pay | Admitting: Cardiovascular Disease

## 2014-03-20 NOTE — Telephone Encounter (Signed)
Rx refill sent to patient pharmacy   

## 2014-04-04 ENCOUNTER — Other Ambulatory Visit (HOSPITAL_COMMUNITY): Payer: Self-pay | Admitting: Urology

## 2014-04-04 DIAGNOSIS — N400 Enlarged prostate without lower urinary tract symptoms: Secondary | ICD-10-CM

## 2014-04-06 ENCOUNTER — Ambulatory Visit (HOSPITAL_COMMUNITY)
Admission: RE | Admit: 2014-04-06 | Discharge: 2014-04-06 | Disposition: A | Discharge status: Home / Self Care | Payer: Medicare Other | Source: Ambulatory Visit | Attending: Urology | Admitting: Urology

## 2014-04-06 DIAGNOSIS — R9389 Abnormal findings on diagnostic imaging of other specified body structures: Secondary | ICD-10-CM | POA: Diagnosis not present

## 2014-04-06 DIAGNOSIS — R300 Dysuria: Secondary | ICD-10-CM | POA: Insufficient documentation

## 2014-04-06 DIAGNOSIS — N400 Enlarged prostate without lower urinary tract symptoms: Secondary | ICD-10-CM

## 2014-07-19 ENCOUNTER — Encounter (HOSPITAL_COMMUNITY): Payer: Self-pay | Admitting: Cardiology

## 2014-08-16 ENCOUNTER — Encounter: Payer: Self-pay | Admitting: Cardiovascular Disease

## 2014-08-16 ENCOUNTER — Ambulatory Visit (INDEPENDENT_AMBULATORY_CARE_PROVIDER_SITE_OTHER): Payer: PPO | Admitting: Cardiovascular Disease

## 2014-08-16 VITALS — BP 132/82 | HR 64 | Ht 72.0 in | Wt 220.9 lb

## 2014-08-16 DIAGNOSIS — E785 Hyperlipidemia, unspecified: Secondary | ICD-10-CM

## 2014-08-16 DIAGNOSIS — Z955 Presence of coronary angioplasty implant and graft: Secondary | ICD-10-CM

## 2014-08-16 DIAGNOSIS — I10 Essential (primary) hypertension: Secondary | ICD-10-CM

## 2014-08-16 DIAGNOSIS — Z79899 Other long term (current) drug therapy: Secondary | ICD-10-CM

## 2014-08-16 DIAGNOSIS — I739 Peripheral vascular disease, unspecified: Secondary | ICD-10-CM

## 2014-08-16 NOTE — Assessment & Plan Note (Signed)
History of hypertension with blood pressure measured today at 132/82. He is on metoprolol and ramipril. Continue current meds at current dosing.

## 2014-08-16 NOTE — Assessment & Plan Note (Signed)
History of left SFA stenting by myself 02/27/05 with three-vessel runoff bilaterally. His left lower extremity Doppler studies were over one year ago. He denies claudication. We will repeat lower extremity arterial Doppler studies.

## 2014-08-16 NOTE — Assessment & Plan Note (Signed)
History of hyperlipidemia. The patient is statin intolerant. His last liver profile one year ago revealed a total cholesterol 162, LDL of 90 and HDL of 34.

## 2014-08-16 NOTE — Progress Notes (Signed)
08/16/2014 [name]   [birth year]  [id number]  Primary Physician Maggie Font, MD Primary Cardiologist: [name] MD [name]   HPI:  The patient is a 69 year old mildly overweight married [place] American male father of 3 who I last saw in the office 10 months ago. He has a history of PVOD status post left SFA, PTA and stenting by myself back in July of 2006 with subsequent improvement in his claudication and Dopplers. These were last done in April of last year and showed ABIs of greater than 1 bilaterally. His other problems include hypertension, hyperlipidemia, non-insulin-requiring diabetes and statin intolerance. I catheterized him August 28, 2010 and stented his first OM branch with a bare metal stent. He was readmitted September 09, 2010 with recurrent chest pain and was re-cathed by Dr. Ellyn Hack via the right radial approach revealing a widely patent stent. He was admitted again June 29th through July 3rd with chest pain and ruled out for myocardial infarction. His stress test showed subtle lateral ischemia and cath performed by Dr. Claiborne Billings February 10, 2012 revealed 95% proximal in-stent restenosis within the OM stent which Dr. Claiborne Billings opened up with a cutting balloon. He had done well until December of last year when he was admitted again with chest pain. Dr. Ellyn Hack recathed him revealing again in-stent restenosis which was "restented" with a Promus drug-eluting stent. He was having chest pain when I saw him back in December, however, since that time he has had minimal chest pain and he does walk 3-5 miles a day. His most recent liver profile performed 05/29/13 revealed a total cholesterol 162, LDL of 90 and HDL of 34. He was recently admitted with chest pain/rule out MI 08/30/13. This is either negative. 2-D echo was essentially unremarkable with mild inferoseptal hypokinesia and a Myoview stress test was read as low risk. His medications were adjusted. Ranexa was added..He  has had no recurrent chest pain since I saw him back in March. He also denies claudication.   Current Outpatient Prescriptions  Medication Sig Dispense Refill  . acetaminophen (TYLENOL) 500 MG tablet Take 1,000 mg by mouth every 6 (six) hours as needed for mild pain.    Marland Kitchen aspirin (ECOTRIN) 325 MG EC tablet Take 325 mg by mouth daily.    . clopidogrel (PLAVIX) 75 MG tablet TAKE ONE TABLET BY MOUTH ONCE DAILY. 30 tablet 8  . Coenzyme Q10 (CO Q 10) 100 MG CAPS Take 400 mg by mouth daily.     Marland Kitchen DEXILANT 60 MG capsule TAKE (1) CAPSULE BY MOUTH TWICE DAILY. 60 capsule 3  . dextromethorphan-guaiFENesin (MUCINEX DM) 30-600 MG per 12 hr tablet Take 1 tablet by mouth 2 (two) times daily.    . fexofenadine (ALLEGRA) 180 MG tablet Take 180 mg by mouth daily.      Marland Kitchen glipiZIDE-metformin (METAGLIP) 5-500 MG per tablet Take 1 tablet by mouth every evening.    Marland Kitchen ibuprofen (ADVIL,MOTRIN) 200 MG tablet Take 400 mg by mouth daily as needed. For pain     . isosorbide mononitrate (IMDUR) 30 MG 24 hr tablet TAKE 1 TABLET EVERY DAY. 30 tablet 5  . metoprolol succinate (TOPROL-XL) 25 MG 24 hr tablet TAKE 1/2 TABLET BY MOUTH DAILY. 15 tablet 9  . nitroGLYCERIN (NITROSTAT) 0.4 MG SL tablet Place 1 tablet (0.4 mg total) under the tongue every 5 (five) minutes as needed. For chest pain 25 tablet 11  . pyridOXINE (VITAMIN B-6) 100 MG tablet Take 100 mg by  mouth daily.    . ramipril (ALTACE) 5 MG capsule TAKE (1) CAPSULE BY MOUTH ONCE DAILY. 30 capsule 7  . ranolazine (RANEXA) 500 MG 12 hr tablet Take 1 tablet (500 mg total) by mouth 2 (two) times daily. 60 tablet 11  . TOCOPHEROLS-TOCOTRIENOLS PO Take 1 tablet by mouth daily.    . vitamin B-12 (CYANOCOBALAMIN) 1000 MCG tablet Take 1,000 mcg by mouth daily.     No current facility-administered medications for this visit.    Allergies  Allergen Reactions  . Propoxyphene N-Acetaminophen   . Statins Other (See Comments)    Severe muscle cramping/aching/pain  . Tape      Blisters  . Flomax [Tamsulosin Hcl] Rash  . Levaquin [Levofloxacin Hemihydrate] Rash  . Penicillins Rash    History   Social History  . Marital Status: Married    Spouse Name: N/A    Number of Children: 3  . Years of Education: N/A   Occupational History  . LAB TECH [employer]   Social History Main Topics  . Smoking status: Former Smoker -- 59 years  . Smokeless tobacco: Not on file     Comment: quit 10 yrs ago  . Alcohol Use: No  . Drug Use: No  . Sexual Activity: No   Other Topics Concern  . Not on file   Social History Narrative     Review of Systems: General: negative for chills, fever, night sweats or weight changes.  Cardiovascular: negative for chest pain, dyspnea on exertion, edema, orthopnea, palpitations, paroxysmal nocturnal dyspnea or shortness of breath Dermatological: negative for rash Respiratory: negative for cough or wheezing Urologic: negative for hematuria Abdominal: negative for nausea, vomiting, diarrhea, bright red blood per rectum, melena, or hematemesis Neurologic: negative for visual changes, syncope, or dizziness All other systems reviewed and are otherwise negative except as noted above.    Blood pressure 132/82, pulse 64, height 6' (1.829 m), weight 220 lb 14.4 oz (100.2 kg).  General appearance: alert and no distress Neck: no adenopathy, no carotid bruit, no JVD, supple, symmetrical, trachea midline and thyroid not enlarged, symmetric, no tenderness/mass/nodules Lungs: clear to auscultation bilaterally Heart: regular rate and rhythm, S1, S2 normal, no murmur, click, rub or gallop Extremities: extremities normal, atraumatic, no cyanosis or edema  EKG normal sinus rhythm 64 without ST or T-wave changes. I personally reviewed this EKG  ASSESSMENT AND PLAN:   Stented coronary artery History of CAD status post circumflex obtuse marginal branch stenting using a bare-metal stent 08/28/10. He had in-stent restenosis with 3  intervention by Dr. Claiborne Billings 02/10/12. He again was recathed by Dr. Ellyn Hack because of recurrent chest pain and had a Promus drug-eluting stent placed in the obtuse marginal branch. He currently denies chest pain or shortness of breath.  Peripheral vascular disease History of left SFA stenting by myself 02/27/05 with three-vessel runoff bilaterally. His left lower extremity Doppler studies were over one year ago. He denies claudication. We will repeat lower extremity arterial Doppler studies.  HTN (hypertension) History of hypertension with blood pressure measured today at 132/82. He is on metoprolol and ramipril. Continue current meds at current dosing.  Dyslipidemia History of hyperlipidemia. The patient is statin intolerant. His last liver profile one year ago revealed a total cholesterol 162, LDL of 90 and HDL of 34.      [name] MD FACP,FACC,FAHA, [facility] 08/16/2014 4:19 PM

## 2014-08-16 NOTE — Patient Instructions (Signed)
Lower extremity arterial doppler- During this test, ultrasound is used to evaluate arterial blood flow in the legs. Allow approximately one hour for this exam.   Dr. Gwenlyn Found has ordered for you to have lab work done soon, and your MUST be FASTING.  Your physician wants you to follow-up in 1 year with Dr. Gwenlyn Found. You will receive a reminder letter in the mail 2 months in advance. If you do not receive a letter, please call our office to schedule the follow-up appointment.

## 2014-08-16 NOTE — Assessment & Plan Note (Signed)
History of CAD status post circumflex obtuse marginal branch stenting using a bare-metal stent 08/28/10. He had in-stent restenosis with 3 intervention by Dr. Claiborne Billings 02/10/12. He again was recathed by Dr. Ellyn Hack because of recurrent chest pain and had a Promus drug-eluting stent placed in the obtuse marginal branch. He currently denies chest pain or shortness of breath.

## 2014-08-24 LAB — LIPID PANEL
CHOLESTEROL: 211 mg/dL — AB (ref 0–200)
HDL: 31 mg/dL — AB (ref >39)
LDL Cholesterol: 103 mg/dL — ABNORMAL HIGH (ref 0–99)
TRIGLYCERIDES: 387 mg/dL — AB (ref <150)
Total CHOL/HDL Ratio: 6.8 Ratio
VLDL: 77 mg/dL — AB (ref 0–40)

## 2014-08-24 LAB — HEPATIC FUNCTION PANEL
ALBUMIN: 3.7 g/dL (ref 3.5–5.2)
ALT: 21 U/L (ref 0–53)
AST: 20 U/L (ref 0–37)
Alkaline Phosphatase: 49 U/L (ref 39–117)
BILIRUBIN INDIRECT: 0.4 mg/dL (ref 0.2–1.2)
Bilirubin, Direct: 0.1 mg/dL (ref 0.0–0.3)
TOTAL PROTEIN: 7 g/dL (ref 6.0–8.3)
Total Bilirubin: 0.5 mg/dL (ref 0.2–1.2)

## 2014-08-31 ENCOUNTER — Ambulatory Visit (HOSPITAL_COMMUNITY)
Admission: RE | Admit: 2014-08-31 | Discharge: 2014-08-31 | Disposition: A | Discharge status: Home / Self Care | Payer: PPO | Source: Ambulatory Visit | Attending: Cardiology | Admitting: Cardiology

## 2014-08-31 DIAGNOSIS — I739 Peripheral vascular disease, unspecified: Secondary | ICD-10-CM | POA: Insufficient documentation

## 2014-08-31 NOTE — Progress Notes (Signed)
Lower Extremity Arterial Duplex Completed. °Brianna L Mazza,RVT °

## 2014-09-04 ENCOUNTER — Other Ambulatory Visit: Payer: Self-pay | Admitting: Gastroenterology

## 2014-09-05 NOTE — Progress Notes (Signed)
First need to bring triglycerides down.  Start him on tricor 145mg  daily and Lovaza 1 gm bid.  Can do OTC fish oil if Lovaza not covered on his insurance.  Repeat labs in 2 months.  I will see him after those labs.

## 2014-09-06 ENCOUNTER — Telehealth: Payer: Self-pay | Admitting: *Deleted

## 2014-09-06 ENCOUNTER — Other Ambulatory Visit: Payer: Self-pay | Admitting: *Deleted

## 2014-09-06 DIAGNOSIS — E785 Hyperlipidemia, unspecified: Secondary | ICD-10-CM

## 2014-09-06 DIAGNOSIS — Z79899 Other long term (current) drug therapy: Secondary | ICD-10-CM

## 2014-09-06 MED ORDER — FENOFIBRATE 145 MG PO TABS: 145.0000 mg | ORAL_TABLET | Freq: Every day | ORAL | Status: DC

## 2014-09-06 MED ORDER — OMEGA-3-ACID ETHYL ESTERS 1 G PO CAPS: 1.0000 g | ORAL_CAPSULE | Freq: Two times a day (BID) | ORAL | Status: DC

## 2014-09-06 NOTE — Telephone Encounter (Signed)
I spoke with patient about his labs and gave him the information from [place].  Patient verbalized understanding.  He has tried OTC fish oil in the past and got leg cramps from it.  He will try the RX lovaza.  RX sent to pharmacy along with tricor and lab slip mailed to patient.

## 2014-09-06 NOTE — Telephone Encounter (Signed)
-----   Message from Tommy Medal, [place] sent at 09/05/2014  2:26 PM EST ----- [name]  I just replied to a lab JB wanted to start on [place].  Not sure who the note went to [place], you or [place]!   Could you please start him on Tricor 145 qd and Lovaza 1 gm bid (or OTC if not covered) and repeat labs in 2 months.  I can see him after that if we still need to get LDL down at that time.  Thanks [employer]

## 2014-09-08 ENCOUNTER — Encounter: Payer: Self-pay | Admitting: *Deleted

## 2014-10-10 ENCOUNTER — Encounter: Payer: Self-pay | Admitting: Gastroenterology

## 2014-10-11 ENCOUNTER — Other Ambulatory Visit: Payer: Self-pay | Admitting: Cardiovascular Disease

## 2014-10-11 NOTE — Telephone Encounter (Signed)
Rx(s) sent to pharmacy electronically.  

## 2014-10-14 ENCOUNTER — Other Ambulatory Visit: Payer: Self-pay | Admitting: Cardiovascular Disease

## 2014-10-16 ENCOUNTER — Other Ambulatory Visit: Payer: Self-pay | Admitting: Cardiovascular Disease

## 2014-10-16 NOTE — Telephone Encounter (Signed)
Rx(s) sent to pharmacy electronically.  

## 2014-10-31 ENCOUNTER — Ambulatory Visit (INDEPENDENT_AMBULATORY_CARE_PROVIDER_SITE_OTHER): Payer: PPO | Admitting: Urology

## 2014-10-31 DIAGNOSIS — N400 Enlarged prostate without lower urinary tract symptoms: Secondary | ICD-10-CM

## 2014-11-07 ENCOUNTER — Other Ambulatory Visit: Payer: Self-pay | Admitting: Cardiovascular Disease

## 2014-11-07 NOTE — Telephone Encounter (Signed)
Rx has been sent to the pharmacy electronically. ° °

## 2014-11-08 ENCOUNTER — Ambulatory Visit (INDEPENDENT_AMBULATORY_CARE_PROVIDER_SITE_OTHER): Payer: PPO | Admitting: Gastroenterology

## 2014-11-08 ENCOUNTER — Encounter: Payer: Self-pay | Admitting: Gastroenterology

## 2014-11-08 VITALS — BP 131/72 | HR 66 | Temp 97.1°F | Ht 72.0 in | Wt 219.4 lb

## 2014-11-08 DIAGNOSIS — K219 Gastro-esophageal reflux disease without esophagitis: Secondary | ICD-10-CM

## 2014-11-08 DIAGNOSIS — R160 Hepatomegaly, not elsewhere classified: Secondary | ICD-10-CM

## 2014-11-08 NOTE — Progress Notes (Signed)
CC'ED TO PCP 

## 2014-11-08 NOTE — Assessment & Plan Note (Signed)
69 year old male with GERD, overall fairly well controlled with once daily dosing of Dexilant. He occasionally has used this twice a day when reflux symptoms exacerbated by dietary and behavior reasons. No concerning signs/symptoms. Will continue Dexilant once daily and return in 1 year or sooner if needed. Next screening colonoscopy 2021.

## 2014-11-08 NOTE — Assessment & Plan Note (Signed)
Query mild hepatomegaly on exam. Update US abdomen now.

## 2014-11-08 NOTE — Patient Instructions (Signed)
I have set you up for an ultrasound of your liver.   We will see you back in 1 year! Contact us with any issues in the meantime.

## 2014-11-08 NOTE — Progress Notes (Signed)
Referring Provider: Iona Beard, MD Primary Care Physician:  Maggie Font, MD  Primary GI: Dr. Gala Romney   Chief Complaint  Patient presents with  . Follow-up  . Gastrophageal Reflux    HPI:   [name] is a 69 y.o. male presenting today with a history of GERD and constipation. Last EGD was in July 2011 with cervical esophageal web and non-critical Schatzki's ring s/p dilation. He has failed pantoprazole, Aciphex, and Prevacid in the past. [place] of abdomen 2013 normal. Remote colonoscopy in 2002 by Dr. Gala Romney was normal, with last colonoscopy 2011 by Dr. Arnoldo Morale normal. Routine screening due again in 2021.   Taking Dexilant once a day mainly, sometimes twice a day but this is rare. No dysphagia. No abdominal pain. Denies N/V. Occasional constipation. Will sometimes take a mild laxative from over the counter, which resolves symptoms. No hematochezia. Some nocturnal reflux occasionally. Sits with his aunt during the day, who has dementia. Will eat around 8:30 or 9 in the evening then go to bed. Tries to avoid this.   Past Medical History  Diagnosis Date  . Coronary artery disease     s/p multiple caths 2012, stenting  . Diabetes mellitus   . Hypertension   . CHF (congestive heart failure)   . GERD (gastroesophageal reflux disease)   . PVD (peripheral vascular disease)     left SFA PTA & stenting in 02/2005 (Dr. Adora Fridge)  . Hyperlipidemia     Past Surgical History  Procedure Laterality Date  . Femoral artery stent  02/27/2005    L SFA stenting - [place] down SFA across lesion - predilatation with 4x4 Powerflex, stenting with 7x4 Smart, post-dilatation with 6x4 powerflex (Dr. Adora Fridge)  . 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 Colonoscopy  12/2009    Dr. Hampton Abbot  . Transthoracic echocardiogram  09/08/2013    EF 50-55%, mild LVH, grade 1 diastolic  dysfunction, mildly calcified AV annulus, calcified MV, LA mildly dilated,   . Nm myocar perf wall motion  09/08/2013    abnormal lexiscan - low to intermediate risk;   . Cardiac catheterization  12/23/2004    normal L main, normal LAD, normal L Cfx, RCA with 20% hypodense lesion in first end of vessel (Dr. Adora Fridge)  . Cardiac catheterization  08/26/2007    no significant CAD by cath, EF 50% (Dr. Jackie Plum)  . Cardiac catheterization  08/28/2010    stent to OM1 with 2.0x61mm BMS (Dr. Adora Fridge)  . Cardiac catheterization  09/11/2010    patent stent (Dr. Roni Bread)  . Cardiac catheterization  02/10/2011    95% prox in-stent restenosis within OM stent - opened with cutting balloon (Dr. Corky Downs)  . Cardiac catheterization  07/17/2011    in-stent restenosis - re-stented with Promus 2.25x9mm DES (Dr. Roni Bread)  . Left heart catheterization with coronary angiogram N/A 07/17/2011    Procedure: LEFT HEART CATHETERIZATION WITH CORONARY ANGIOGRAM;  Surgeon: Leonie Man, MD;  Location[facility] C[facility] LAB;  Service: Cardiovascular;  Laterality: N/A;  Right radial approach    Current Outpatient Prescriptions  Medication Sig Dispense Refill  . acetaminophen (TYLENOL) 500 MG tablet Take 1,000 mg by mouth every 6 (six) hours as needed for mild pain.    Marland Kitchen aspirin (ECOTRIN) 325 MG EC tablet Take 325 mg by mouth daily.    . clopidogrel (PLAVIX) 75 MG tablet TAKE ONE  TABLET BY MOUTH ONCE DAILY. 30 tablet 9  . Coenzyme Q10 (CO Q 10) 100 MG CAPS Take 400 mg by mouth daily.     Marland Kitchen DEXILANT 60 MG capsule TAKE (1) CAPSULE BY MOUTH TWICE DAILY. 60 capsule 5  . dextromethorphan-guaiFENesin (MUCINEX DM) 30-600 MG per 12 hr tablet Take 1 tablet by mouth as needed.     . fenofibrate (TRICOR) 145 MG tablet Take 1 tablet (145 mg total) by mouth daily. 30 tablet 5  . fexofenadine (ALLEGRA) 180 MG tablet Take 180 mg by mouth daily.      Marland Kitchen glipiZIDE-metformin (METAGLIP) 5-500 MG per tablet Take 1 tablet by mouth every evening.      Marland Kitchen ibuprofen (ADVIL,MOTRIN) 200 MG tablet Take 400 mg by mouth daily as needed. For pain     . isosorbide mononitrate (IMDUR) 30 MG 24 hr tablet Take 1 tablet (30 mg total) by mouth daily. 30 tablet 10  . metoprolol succinate (TOPROL-XL) 25 MG 24 hr tablet TAKE 1/2 TABLET BY MOUTH DAILY. 15 tablet 9  . nitroGLYCERIN (NITROSTAT) 0.4 MG SL tablet Place 1 tablet (0.4 mg total) under the tongue every 5 (five) minutes as needed. For chest pain 25 tablet 11  . omega-3 acid ethyl esters (LOVAZA) 1 G capsule Take 1 capsule (1 g total) by mouth 2 (two) times daily. 60 capsule 5  . pyridOXINE (VITAMIN B-6) 100 MG tablet Take 100 mg by mouth daily.    . ramipril (ALTACE) 5 MG capsule TAKE (1) CAPSULE BY MOUTH ONCE DAILY. 30 capsule 10  . RANEXA 500 MG 12 hr tablet TAKE (1) TABLET BY MOUTH TWICE DAILY. 60 tablet 9  . TOCOPHEROLS-TOCOTRIENOLS PO Take 1 tablet by mouth daily.    . vitamin B-12 (CYANOCOBALAMIN) 1000 MCG tablet Take 1,000 mcg by mouth daily.     No current facility-administered medications for this visit.    Allergies as of 11/08/2014 - Review Complete 11/08/2014  Allergen Reaction Noted  . Propoxyphene n-acetaminophen    . Statins Other (See Comments) 07/29/2011  . Tape  07/29/2011  . Flomax [tamsulosin hcl] Rash 08/16/2014  . Levaquin [levofloxacin hemihydrate] Rash 07/29/2011  . Penicillins Rash     Family History  Problem Relation Age of Onset  . Colon cancer Neg Hx   . Liver disease Neg Hx   . Inflammatory bowel disease Neg Hx   . Arrhythmia Mother 58    History   Social History  . Marital Status: Married    Spouse Name: N/A  . Number of Children: 3  . Years of Education: N/A   Occupational History  . LAB TECH [employer]   Social History Main Topics  . Smoking status: Former Smoker -- 51 years  . Smokeless tobacco: Not on file     Comment: quit 10 yrs ago  . Alcohol Use: No  . Drug Use: No  . Sexual Activity: No   Other Topics Concern  . None    Social History Narrative    Review of Systems: As mentioned in HPI.   Physical Exam: BP 131/72 mmHg  Pulse 66  Temp(Src) 97.1 F (36.2 C)  Ht 6' (1.829 m)  Wt 219 lb 6.4 oz (99.519 kg)  BMI 29.75 kg/m2 General:   Alert and oriented. No distress noted. Pleasant and cooperative.  Head:  Normocephalic and atraumatic. Eyes:  Conjuctiva clear without scleral icterus. Mouth:  Oral mucosa pink and moist. Good dentition. No lesions. Heart:  S1, S2 present without murmurs Abdomen:  +  BS, soft, non-tender and non-distended. No rebound or guarding. Query mild hepatomegaly but liver margin smooth Msk:  Symmetrical without gross deformities. Normal posture. Extremities:  Without edema. Neurologic:  Alert and  oriented x4;  grossly normal neurologically. Skin:  Intact without significant lesions or rashes. Psych:  Alert and cooperative. Normal mood and affect.

## 2014-11-10 ENCOUNTER — Ambulatory Visit (HOSPITAL_COMMUNITY)
Admission: RE | Admit: 2014-11-10 | Discharge: 2014-11-10 | Disposition: A | Discharge status: Home / Self Care | Payer: PPO | Source: Ambulatory Visit | Attending: Gastroenterology | Admitting: Gastroenterology

## 2014-11-10 DIAGNOSIS — R160 Hepatomegaly, not elsewhere classified: Secondary | ICD-10-CM | POA: Diagnosis present

## 2014-11-11 LAB — LIPID PANEL
CHOL/HDL RATIO: 3 ratio
Cholesterol: 182 mg/dL (ref 0–200)
HDL: 61 mg/dL (ref >=40)
LDL Cholesterol: 110 mg/dL — ABNORMAL HIGH (ref 0–99)
Triglycerides: 56 mg/dL (ref <150)
VLDL: 11 mg/dL (ref 0–40)

## 2014-11-11 LAB — HEPATIC FUNCTION PANEL
ALBUMIN: 4 g/dL (ref 3.5–5.2)
ALK PHOS: 37 U/L — AB (ref 39–117)
ALT: 29 U/L (ref 0–53)
AST: 26 U/L (ref 0–37)
Bilirubin, Direct: 0.1 mg/dL (ref 0.0–0.3)
Indirect Bilirubin: 0.5 mg/dL (ref 0.2–1.2)
Total Bilirubin: 0.6 mg/dL (ref 0.2–1.2)
Total Protein: 6.9 g/dL (ref 6.0–8.3)

## 2014-11-20 NOTE — Progress Notes (Signed)
Quick Note:  Good news! Liver normal. No further work-up. ______

## 2014-11-23 ENCOUNTER — Ambulatory Visit (INDEPENDENT_AMBULATORY_CARE_PROVIDER_SITE_OTHER): Payer: PPO | Admitting: Pharmacist Clinician (PhC)/ Clinical Pharmacy Specialist

## 2014-11-23 VITALS — Ht 72.0 in | Wt 221.0 lb

## 2014-11-23 DIAGNOSIS — E785 Hyperlipidemia, unspecified: Secondary | ICD-10-CM | POA: Diagnosis not present

## 2014-11-23 NOTE — Patient Instructions (Signed)
Continue to follow a high fiber diet.  Oatmeal, cheerios, beans, and high fiber cereals are good sources.  Continue with your gummy-fiber tablets.    Try Cholestoff by Osie Cheeks.  2 capsules daily.   Continue to exercise, stay active  Will repeat labs in 3 months.

## 2014-11-26 ENCOUNTER — Encounter: Payer: Self-pay | Admitting: Pharmacist Clinician (PhC)/ Clinical Pharmacy Specialist

## 2014-11-26 NOTE — Assessment & Plan Note (Addendum)
Since January labs pt has seen large improvement in TG and HDL, with small increase in LDL.  He is not interested in trying a PCSK-9 inhibitor at this time, but would prefer to continue with high fiber, low carb diet.  Reviewed with him foods that are beneficial in lowering cholesterol.  Also suggested he try Cholestoff from [facility], 2 capsules daily.  Will repeat labs in 3 months.  At that time will consider adding Zetia to see if we can get the LDL down closer to goal.

## 2014-11-26 NOTE — Progress Notes (Addendum)
11/26/2014 [name] [birth year] [id number]   HPI:  [name] is a 69 y.o. male patient of Dr Gwenlyn Found, who presents today for a lipid clinic evaluation.   RF:  SFA stent (2006), cardiac stents in 2012, 2013  Meds: fenofibrate, fish oil   Tried in the past:  Multiple statins caused leg cramps, including Livalo 2 mg twice weekly.  Will pull paper chart for more statin history.     Family history: maternal uncle deceased from MI at 4 as well as cousin at 78.    Diet: tries to follow healthy diet, no fried foods, very little in high-carb "white" foods, eats lots of fruits and vegetables  Exercise: walks 3-4 miles per day    Labs:   11/2014:  TC 182, TG 56, HDL 61, LDL 110 (fenofibrate, fish oil) 08/2014:  TC 211, TG 387, HDL 31, LDL 103 05/2013:  TC 162, TG 188, HDL 34, LDL 90   Current Outpatient Prescriptions  Medication Sig Dispense Refill  . acetaminophen (TYLENOL) 500 MG tablet Take 1,000 mg by mouth every 6 (six) hours as needed for mild pain.    Marland Kitchen aspirin (ECOTRIN) 325 MG EC tablet Take 325 mg by mouth daily.    . clopidogrel (PLAVIX) 75 MG tablet TAKE ONE TABLET BY MOUTH ONCE DAILY. 30 tablet 9  . Coenzyme Q10 (CO Q 10) 100 MG CAPS Take 400 mg by mouth daily.     Marland Kitchen DEXILANT 60 MG capsule TAKE (1) CAPSULE BY MOUTH TWICE DAILY. 60 capsule 5  . dextromethorphan-guaiFENesin (MUCINEX DM) 30-600 MG per 12 hr tablet Take 1 tablet by mouth as needed.     . fenofibrate (TRICOR) 145 MG tablet Take 1 tablet (145 mg total) by mouth daily. 30 tablet 5  . fexofenadine (ALLEGRA) 180 MG tablet Take 180 mg by mouth daily.      Marland Kitchen glipiZIDE-metformin (METAGLIP) 5-500 MG per tablet Take 1 tablet by mouth every evening.    Marland Kitchen ibuprofen (ADVIL,MOTRIN) 200 MG tablet Take 400 mg by mouth daily as needed. For pain     . isosorbide mononitrate (IMDUR) 30 MG 24 hr tablet Take 1 tablet (30 mg total) by mouth daily. 30 tablet 10  . metoprolol succinate (TOPROL-XL) 25 MG 24 hr tablet TAKE 1/2 TABLET  BY MOUTH DAILY. 15 tablet 9  . nitroGLYCERIN (NITROSTAT) 0.4 MG SL tablet Place 1 tablet (0.4 mg total) under the tongue every 5 (five) minutes as needed. For chest pain 25 tablet 11  . omega-3 acid ethyl esters (LOVAZA) 1 G capsule Take 1 capsule (1 g total) by mouth 2 (two) times daily. 60 capsule 5  . pyridOXINE (VITAMIN B-6) 100 MG tablet Take 100 mg by mouth daily.    . ramipril (ALTACE) 5 MG capsule TAKE (1) CAPSULE BY MOUTH ONCE DAILY. 30 capsule 10  . RANEXA 500 MG 12 hr tablet TAKE (1) TABLET BY MOUTH TWICE DAILY. 60 tablet 9  . TOCOPHEROLS-TOCOTRIENOLS PO Take 1 tablet by mouth daily.    . vitamin B-12 (CYANOCOBALAMIN) 1000 MCG tablet Take 1,000 mcg by mouth daily.     No current facility-administered medications for this visit.    Allergies  Allergen Reactions  . Propoxyphene N-Acetaminophen   . Statins Other (See Comments)    Severe muscle cramping/aching/pain  . Tape     Blisters  . Flomax [Tamsulosin Hcl] Rash  . Levaquin [Levofloxacin Hemihydrate] Rash  . Penicillins Rash    Past Medical History  Diagnosis Date  .  Coronary artery disease     s/p multiple caths 2012, stenting  . Diabetes mellitus   . Hypertension   . CHF (congestive heart failure)   . GERD (gastroesophageal reflux disease)   . PVD (peripheral vascular disease)     left SFA PTA & stenting in 02/2005 (Dr. Adora Fridge)  . Hyperlipidemia     Height 6' (1.829 m), weight 221 lb (100.245 kg).  Tommy Medal PharmD CPP [facility]   Addendum:  Pulled paper chart for history related to cholesterol medications:  Niaspan 500 mg 2qd    10/2004- 10/2005  Simvastatin 20mg  qd   01/2005 - 10/2005  Colesevelam 625mg  3 qd   11/2006 - 09/2007  Trilipix 135 qd               11/2009 -06/2010  Livalo 2 mg 2x/wk        08/2011 - 07/2012

## 2014-11-27 ENCOUNTER — Other Ambulatory Visit: Payer: Self-pay | Admitting: Cardiovascular Disease

## 2014-11-29 ENCOUNTER — Other Ambulatory Visit: Payer: Self-pay | Admitting: Cardiovascular Disease

## 2015-01-02 ENCOUNTER — Encounter: Payer: Self-pay | Admitting: Cardiovascular Disease

## 2015-02-14 ENCOUNTER — Other Ambulatory Visit: Payer: Self-pay | Admitting: Cardiovascular Disease

## 2015-02-14 NOTE — Telephone Encounter (Signed)
Rx(s) sent to pharmacy electronically.  

## 2015-04-20 ENCOUNTER — Other Ambulatory Visit: Payer: Self-pay | Admitting: Cardiovascular Disease

## 2015-04-21 LAB — LIPID PANEL W/O CHOL/HDL RATIO
CHOLESTEROL TOTAL: 229 mg/dL — AB (ref 100–199)
HDL: 48 mg/dL (ref >39)
LDL CALC: 155 mg/dL — AB (ref 0–99)
TRIGLYCERIDES: 131 mg/dL (ref 0–149)
VLDL Cholesterol Cal: 26 mg/dL (ref 5–40)

## 2015-04-21 LAB — HEPATIC FUNCTION PANEL
ALK PHOS: 39 IU/L (ref 39–117)
ALT: 19 IU/L (ref 0–44)
AST: 20 IU/L (ref 0–40)
Albumin: 4.2 g/dL (ref 3.6–4.8)
BILIRUBIN, DIRECT: 0.16 mg/dL (ref 0.00–0.40)
Bilirubin Total: 0.8 mg/dL (ref 0.0–1.2)
Total Protein: 7.3 g/dL (ref 6.0–8.5)

## 2015-05-03 ENCOUNTER — Ambulatory Visit (INDEPENDENT_AMBULATORY_CARE_PROVIDER_SITE_OTHER): Payer: PPO | Admitting: Pharmacist Clinician (PhC)/ Clinical Pharmacy Specialist

## 2015-05-03 ENCOUNTER — Encounter: Payer: Self-pay | Admitting: Pharmacist Clinician (PhC)/ Clinical Pharmacy Specialist

## 2015-05-03 VITALS — Ht 72.0 in | Wt 218.8 lb

## 2015-05-03 DIAGNOSIS — Z955 Presence of coronary angioplasty implant and graft: Secondary | ICD-10-CM | POA: Diagnosis not present

## 2015-05-03 DIAGNOSIS — E785 Hyperlipidemia, unspecified: Secondary | ICD-10-CM | POA: Diagnosis not present

## 2015-05-03 MED ORDER — SIMVASTATIN 20 MG PO TABS: 20.0000 mg | ORAL_TABLET | Freq: Every day | ORAL | Status: DC

## 2015-05-03 MED ORDER — NITROGLYCERIN 0.4 MG SL SUBL: 0.4000 mg | SUBLINGUAL_TABLET | SUBLINGUAL | Status: DC | PRN

## 2015-05-03 NOTE — Assessment & Plan Note (Signed)
Since last labs, LDL has increased almost 50 points.  Patient comes in today and states would like to try simvastatin again.  He took several years ago, was on it for almost 2 years before he developed myalgias.  We agreed to start at 20 mg and see if he can tolerate that.  He will repeat labs in 3 months.  Encouraged him to continue with healthy diet and exercise regimens.

## 2015-05-03 NOTE — Progress Notes (Signed)
05/03/2015 [name] [birth year] [id number]   HPI:  [name] is a 69 y.o. male patient of Dr Gwenlyn Found, who presents today for a lipid clinic follow up.  At his last visit in April we saw some improvements in triglycerides and HDL, with no changes in LDL.  He has tried several statins, as well as colesevelam, niacin and fenofibrate.  He most recently stopped the fenofibrate and fish oil, and has been working harder to control his numbers with dietary changes.  Today he returns with a higher triglycerides, although still below 150, and an LDL that increased from 110 to 155.     RF:  SFA stent (2006), cardiac stents in 2012, 2013  Meds: fenofibrate, fish oil   Tried in the past:  Multiple statins caused leg cramps, including Livalo 2 mg twice weekly.  Will pull paper chart for more statin history.     Family history: maternal uncle deceased from MI at 76 as well as cousin at 34.    Diet: tries to follow healthy diet, no fried foods, very little in high-carb "white" foods, eats lots of fruits and vegetables  Exercise: walks 3-4 miles per day    Labs:   04/2015:  TC 229, TG 131, HDL 48, LDL 155 (no meds) 11/2014:  TC 182, TG 56, HDL 61, LDL 110 (fenofibrate, fish oil) 08/2014:  TC 211, TG 387, HDL 31, LDL 103 05/2013:  TC 162, TG 188, HDL 34, LDL 90   Current Outpatient Prescriptions  Medication Sig Dispense Refill  . acetaminophen (TYLENOL) 500 MG tablet Take 1,000 mg by mouth every 6 (six) hours as needed for mild pain.    Marland Kitchen aspirin (ECOTRIN) 325 MG EC tablet Take 325 mg by mouth daily.    . clopidogrel (PLAVIX) 75 MG tablet TAKE ONE TABLET BY MOUTH ONCE DAILY. 30 tablet 9  . Coenzyme Q10 (CO Q 10) 100 MG CAPS Take 400 mg by mouth daily.     Marland Kitchen DEXILANT 60 MG capsule TAKE (1) CAPSULE BY MOUTH TWICE DAILY. 60 capsule 5  . dextromethorphan-guaiFENesin (MUCINEX DM) 30-600 MG per 12 hr tablet Take 1 tablet by mouth as needed.     . fexofenadine (ALLEGRA) 180 MG tablet Take 180 mg by mouth  daily.      Marland Kitchen glipiZIDE-metformin (METAGLIP) 5-500 MG per tablet Take 1 tablet by mouth every evening.    Marland Kitchen ibuprofen (ADVIL,MOTRIN) 200 MG tablet Take 400 mg by mouth daily as needed. For pain     . isosorbide mononitrate (IMDUR) 30 MG 24 hr tablet Take 1 tablet (30 mg total) by mouth daily. 30 tablet 10  . metoprolol succinate (TOPROL-XL) 25 MG 24 hr tablet TAKE 1/2 TABLET BY MOUTH DAILY. 15 tablet 8  . nitroGLYCERIN (NITROSTAT) 0.4 MG SL tablet Place 1 tablet (0.4 mg total) under the tongue every 5 (five) minutes as needed. For chest pain 25 tablet 11  . pyridOXINE (VITAMIN B-6) 100 MG tablet Take 100 mg by mouth daily.    . ramipril (ALTACE) 5 MG capsule TAKE (1) CAPSULE BY MOUTH ONCE DAILY. 30 capsule 10  . RANEXA 500 MG 12 hr tablet TAKE (1) TABLET BY MOUTH TWICE DAILY. 60 tablet 9  . simvastatin (ZOCOR) 20 MG tablet Take 1 tablet (20 mg total) by mouth at bedtime. 30 tablet 6  . TOCOPHEROLS-TOCOTRIENOLS PO Take 1 tablet by mouth daily.    . vitamin B-12 (CYANOCOBALAMIN) 1000 MCG tablet Take 1,000 mcg by mouth daily.  No current facility-administered medications for this visit.    Allergies  Allergen Reactions  . Propoxyphene N-Acetaminophen   . Statins Other (See Comments)    Severe muscle cramping/aching/pain  . Tape     Blisters  . Flomax [Tamsulosin Hcl] Rash  . Levaquin [Levofloxacin Hemihydrate] Rash  . Penicillins Rash    Past Medical History  Diagnosis Date  . Coronary artery disease     s/p multiple caths 2012, stenting  . Diabetes mellitus   . Hypertension   . CHF (congestive heart failure)   . GERD (gastroesophageal reflux disease)   . PVD (peripheral vascular disease)     left SFA PTA & stenting in 02/2005 (Dr. Adora Fridge)  . Hyperlipidemia     Height 6' (1.829 m), weight 218 lb 12.8 oz (99.247 kg).  Tommy Medal PharmD CPP [facility]   Addendum:  Pulled paper chart for history related to cholesterol medications:  Niaspan  500 mg 2qd    10/2004- 10/2005  Simvastatin 20mg  qd   01/2005 - 10/2005  Colesevelam 625mg  3 qd   11/2006 - 09/2007  Trilipix 135 qd               11/2009 -06/2010  Livalo 2 mg 2x/wk        08/2011 - 07/2012

## 2015-05-03 NOTE — Patient Instructions (Signed)
Restart simvastatin at 20 mg once daily.  Take this at bedtime for best results  Continue to follow a low fat diet, free of "white foods"  Continue with your exercise regimen.    Repeat labs after fist of the new year.  We will call you when those results come in.

## 2015-06-30 ENCOUNTER — Emergency Department (INDEPENDENT_AMBULATORY_CARE_PROVIDER_SITE_OTHER)
Admission: EM | Admit: 2015-06-30 | Discharge: 2015-06-30 | Disposition: A | Discharge status: Home / Self Care | Payer: PPO | Source: Home / Self Care | Attending: Family Medicine | Admitting: Family Medicine

## 2015-06-30 ENCOUNTER — Encounter (HOSPITAL_COMMUNITY): Payer: Self-pay | Admitting: Emergency Medicine

## 2015-06-30 DIAGNOSIS — J0101 Acute recurrent maxillary sinusitis: Secondary | ICD-10-CM

## 2015-06-30 MED ORDER — MOMETASONE FUROATE 50 MCG/ACT NA SUSP: 2.0000 | Freq: Every day | NASAL | Status: DC

## 2015-06-30 NOTE — Discharge Instructions (Signed)
Sinusitis, Adult  Sinusitis is redness, soreness, and puffiness (inflammation) of the air pockets in the bones of your face (sinuses). The redness, soreness, and puffiness can cause air and mucus to get trapped in your sinuses. This can allow germs to grow and cause an infection.   HOME CARE    Drink enough fluids to keep your pee (urine) clear or pale yellow.   Use a humidifier in your home.   Run a hot shower to create steam in the bathroom. Sit in the bathroom with the door closed. Breathe in the steam 3-4 times a day.   Put a warm, moist washcloth on your face 3-4 times a day, or as told by your doctor.   Use salt water sprays (saline sprays) to wet the thick fluid in your nose. This can help the sinuses drain.   Only take medicine as told by your doctor.  GET HELP RIGHT AWAY IF:    Your pain gets worse.   You have very bad headaches.   You are sick to your stomach (nauseous).   You throw up (vomit).   You are very sleepy (drowsy) all the time.   Your face is puffy (swollen).   Your vision changes.   You have a stiff neck.   You have trouble breathing.  MAKE SURE YOU:    Understand these instructions.   Will watch your condition.   Will get help right away if you are not doing well or get worse.     This information is not intended to replace advice given to you by your health care provider. Make sure you discuss any questions you have with your health care provider.     Document Released: 01/14/2008 Document Revised: 08/18/2014 Document Reviewed: 03/02/2012  Elsevier Interactive Patient Education 2016 [employer]

## 2015-06-30 NOTE — ED Provider Notes (Signed)
CSN: [id number]     Arrival date & time 06/30/15  1310 History   First MD Initiated Contact with Patient 06/30/15 1428     No chief complaint on file.  (Consider location/radiation/quality/duration/timing/severity/associated sxs/prior Treatment) HPI  Sinusitis: Patient here because of sinus pain. He has had similar presentation in the past. He c/o pain over his nose, behind her eyes and also on the front of his head, no facial swelling. He denies sneezing a lot or nasal congestion. He stated this is how his sinus problem typically starts. His pain ranges from 3/10 to 7/10 in severity started 3 days ago. He uses Motrin which help with his pain a bit.  Past Medical History  Diagnosis Date  . Coronary artery disease     s/p multiple caths 2012, stenting  . Diabetes mellitus   . Hypertension   . CHF (congestive heart failure)   . GERD (gastroesophageal reflux disease)   . PVD (peripheral vascular disease)     left SFA PTA & stenting in 02/2005 (Dr. Adora Fridge)  . Hyperlipidemia    Past Surgical History  Procedure Laterality Date  . Femoral artery stent  02/27/2005    L SFA stenting - Wholey down SFA across lesion - predilatation with 4x4 Powerflex, stenting with 7x4 Smart, post-dilatation with 6x4 powerflex (Dr. Adora Fridge)  . 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 Colonoscopy  12/2009    Dr. Hampton Abbot  . Transthoracic echocardiogram  09/08/2013    EF 50-55%, mild LVH, grade 1 diastolic dysfunction, mildly calcified AV annulus, calcified MV, LA mildly dilated,   . Nm myocar perf wall motion  09/08/2013    abnormal lexiscan - low to intermediate risk;   . Cardiac catheterization  12/23/2004    normal L main, normal LAD, normal L Cfx, RCA with 20% hypodense lesion in first end of vessel (Dr. Adora Fridge)  . Cardiac catheterization  08/26/2007    no significant CAD by cath,  EF 50% (Dr. Jackie Plum)  . Cardiac catheterization  08/28/2010    stent to OM1 with 2.0x49mm BMS (Dr. Adora Fridge)  . Cardiac catheterization  09/11/2010    patent stent (Dr. Roni Bread)  . Cardiac catheterization  02/10/2011    95% prox in-stent restenosis within OM stent - opened with cutting balloon (Dr. Corky Downs)  . Cardiac catheterization  07/17/2011    in-stent restenosis - re-stented with Promus 2.25x81mm DES (Dr. Roni Bread)  . Left heart catheterization with coronary angiogram N/A 07/17/2011    Procedure: LEFT HEART CATHETERIZATION WITH CORONARY ANGIOGRAM;  Surgeon: Leonie Man, MD;  Location: [facility] CATH [facility];  Service: Cardiovascular;  Laterality: N/A;  Right radial approach   Family History  Problem Relation Age of Onset  . Colon cancer Neg Hx   . Liver disease Neg Hx   . Inflammatory bowel disease Neg Hx   . Arrhythmia Mother 1   Social History  Substance Use Topics  . Smoking status: Former Smoker -- 49 years  . Smokeless tobacco: Not on file     Comment: quit 10 yrs ago  . Alcohol Use: No    Review of Systems  HENT: Negative for ear discharge, ear pain, facial swelling, nosebleeds and postnasal drip.   Respiratory: Negative.   Cardiovascular: Negative.   All other systems reviewed and are negative.   Allergies  Propoxyphene n-acetaminophen; Statins; Tape; Flomax; Levaquin; and Penicillins  Home Medications  Prior to Admission medications   Medication Sig Start Date End Date Taking? Authorizing Provider  acetaminophen (TYLENOL) 500 MG tablet Take 1,000 mg by mouth every 6 (six) hours as needed for mild pain.    Historical Provider, MD  aspirin (ECOTRIN) 325 MG EC tablet Take 325 mg by mouth daily.    Historical Provider, MD  clopidogrel (PLAVIX) 75 MG tablet TAKE ONE TABLET BY MOUTH ONCE DAILY. 11/07/14   [name], MD  Coenzyme Q10 (CO Q 10) 100 MG CAPS Take 400 mg by mouth daily.     Historical Provider, MD  DEXILANT 60 MG capsule TAKE (1) CAPSULE BY MOUTH  TWICE DAILY. 09/04/14   Orvil Feil, NP  dextromethorphan-guaiFENesin [facility] DM) 30-600 MG per 12 hr tablet Take 1 tablet by mouth as needed.     Historical Provider, MD  fexofenadine (ALLEGRA) 180 MG tablet Take 180 mg by mouth daily.      Historical Provider, MD  glipiZIDE-metformin (METAGLIP) 5-500 MG per tablet Take 1 tablet by mouth every evening.    Historical Provider, MD  ibuprofen (ADVIL,MOTRIN) 200 MG tablet Take 400 mg by mouth daily as needed. For pain     Historical Provider, MD  isosorbide mononitrate (IMDUR) 30 MG 24 hr tablet Take 1 tablet (30 mg total) by mouth daily. 10/16/14   [name], MD  metoprolol succinate (TOPROL-XL) 25 MG 24 hr tablet TAKE 1/2 TABLET BY MOUTH DAILY. 11/29/14   [name], MD  nitroGLYCERIN (NITROSTAT) 0.4 MG SL tablet Place 1 tablet (0.4 mg total) under the tongue every 5 (five) minutes as needed. For chest pain 05/03/15   [name], MD  pyridOXINE (VITAMIN B-6) 100 MG tablet Take 100 mg by mouth daily.    Historical Provider, MD  ramipril (ALTACE) 5 MG capsule TAKE (1) CAPSULE BY MOUTH ONCE DAILY. 10/16/14   [name], MD  RANEXA 500 MG 12 hr tablet TAKE (1) TABLET BY MOUTH TWICE DAILY. 10/11/14   [name], MD  simvastatin (ZOCOR) 20 MG tablet Take 1 tablet (20 mg total) by mouth at bedtime. 05/03/15   [name], MD  TOCOPHEROLS-TOCOTRIENOLS PO Take 1 tablet by mouth daily.    Historical Provider, MD  vitamin B-12 (CYANOCOBALAMIN) 1000 MCG tablet Take 1,000 mcg by mouth daily.    Historical Provider, MD   Meds Ordered and Administered this Visit  Medications - No data to display  BP 131/81 mmHg  Pulse 64  Temp(Src) 98.2 F (36.8 C) (Oral)  Resp 18  SpO2 98% No data found.   Physical Exam  Constitutional: He appears well-developed. No distress.  HENT:  Head: Normocephalic and atraumatic.  Right Ear: Tympanic membrane and external ear normal.  Left Ear: Tympanic membrane and external ear normal.  Nose: Right  sinus exhibits maxillary sinus tenderness and frontal sinus tenderness. Left sinus exhibits maxillary sinus tenderness and frontal sinus tenderness.    Mouth/Throat: Oropharynx is clear and moist. No oropharyngeal exudate.  Eyes: Conjunctivae are normal. Pupils are equal, round, and reactive to light. Right eye exhibits no discharge. Left eye exhibits no discharge.  Neck: Neck supple.  Cardiovascular: Normal rate, regular rhythm and normal heart sounds.   No murmur heard. Pulmonary/Chest: Effort normal and breath sounds normal. No respiratory distress. He has no wheezes.  Lymphadenopathy:    He has no cervical adenopathy.  Nursing note and vitals reviewed.   ED Course  Procedures (including critical care time)  Labs Review Labs  Reviewed - No data to display  Imaging Review No results found.   Visual Acuity Review  Right Eye Distance:   Left Eye Distance:   Bilateral Distance:    Right Eye Near:   Left Eye Near:    Bilateral Near:         MDM  No diagnosis found. Acute recurrent maxillary sinusitis   Nasonex prescribed. No antibiotic at this time since it has been going on less than 1 week. Continue Motrin prn pain. Return precaution discussed.    Kinnie Feil, MD 06/30/15 1440

## 2015-06-30 NOTE — ED Notes (Signed)
Sinus  Infection concerns, symptoms since 11/15

## 2015-08-07 ENCOUNTER — Other Ambulatory Visit: Payer: Self-pay | Admitting: Cardiovascular Disease

## 2015-08-07 NOTE — Telephone Encounter (Signed)
Rx request sent to pharmacy.  

## 2015-08-15 ENCOUNTER — Other Ambulatory Visit: Payer: Self-pay | Admitting: Cardiovascular Disease

## 2015-08-15 NOTE — Telephone Encounter (Signed)
Rx request sent to pharmacy.  

## 2015-08-20 ENCOUNTER — Other Ambulatory Visit: Payer: Self-pay | Admitting: Cardiovascular Disease

## 2015-08-20 DIAGNOSIS — I739 Peripheral vascular disease, unspecified: Secondary | ICD-10-CM

## 2015-08-22 ENCOUNTER — Other Ambulatory Visit: Payer: Self-pay | Admitting: Cardiovascular Disease

## 2015-08-22 DIAGNOSIS — E785 Hyperlipidemia, unspecified: Secondary | ICD-10-CM | POA: Diagnosis not present

## 2015-08-23 LAB — LIPID PANEL W/O CHOL/HDL RATIO
CHOLESTEROL TOTAL: 162 mg/dL (ref 100–199)
HDL: 47 mg/dL (ref >39)
LDL Calculated: 94 mg/dL (ref 0–99)
Triglycerides: 104 mg/dL (ref 0–149)
VLDL CHOLESTEROL CAL: 21 mg/dL (ref 5–40)

## 2015-08-23 LAB — HEPATIC FUNCTION PANEL
ALK PHOS: 51 IU/L (ref 39–117)
ALT: 27 IU/L (ref 0–44)
AST: 24 IU/L (ref 0–40)
Albumin: 3.9 g/dL (ref 3.6–4.8)
BILIRUBIN TOTAL: 0.9 mg/dL (ref 0.0–1.2)
Bilirubin, Direct: 0.2 mg/dL (ref 0.00–0.40)
Total Protein: 6.5 g/dL (ref 6.0–8.5)

## 2015-08-28 ENCOUNTER — Encounter: Payer: Self-pay | Admitting: Cardiovascular Disease

## 2015-08-28 ENCOUNTER — Ambulatory Visit (INDEPENDENT_AMBULATORY_CARE_PROVIDER_SITE_OTHER): Payer: PPO | Admitting: Cardiovascular Disease

## 2015-08-28 VITALS — BP 126/70 | HR 59 | Ht 72.0 in | Wt 219.3 lb

## 2015-08-28 DIAGNOSIS — Z789 Other specified health status: Secondary | ICD-10-CM

## 2015-08-28 DIAGNOSIS — E559 Vitamin D deficiency, unspecified: Secondary | ICD-10-CM | POA: Diagnosis not present

## 2015-08-28 DIAGNOSIS — I10 Essential (primary) hypertension: Secondary | ICD-10-CM

## 2015-08-28 DIAGNOSIS — Z889 Allergy status to unspecified drugs, medicaments and biological substances status: Secondary | ICD-10-CM

## 2015-08-28 DIAGNOSIS — R6882 Decreased libido: Secondary | ICD-10-CM | POA: Diagnosis not present

## 2015-08-28 DIAGNOSIS — Z955 Presence of coronary angioplasty implant and graft: Secondary | ICD-10-CM

## 2015-08-28 DIAGNOSIS — I739 Peripheral vascular disease, unspecified: Secondary | ICD-10-CM | POA: Diagnosis not present

## 2015-08-28 MED ORDER — EZETIMIBE 10 MG PO TABS: 10.0000 mg | ORAL_TABLET | Freq: Every day | ORAL | Status: DC

## 2015-08-28 NOTE — Assessment & Plan Note (Signed)
History of circumflex obtuse marginal branch stenting by myself with a vision bare metal stent jugular 2012. He's had several angiograms and interventions since that time by Dr. Claiborne Billings and Dr. Ellyn Hack. He was having some chest pain when he was not taking his Ranexa as prescribed recently but now his pain has resolved. He remains on dual antiplatelet therapy.

## 2015-08-28 NOTE — Assessment & Plan Note (Signed)
History of hyperlipidemia intolerant to statin therapy. He is tolerating simvastatin 20 mg a day with recent lipid profile performed 08/22/15 revealing an LDL of 94 and HDL of 47. He is still not at goal for secondary prevention. I'm going to add Zetia 10 mg a day and recheck

## 2015-08-28 NOTE — Assessment & Plan Note (Signed)
History of peripheral arterial disease status post left SFA PTA and stenting by myself in 2006. Recent Dopplers performed a year ago revealed this to be widely patent with ABIs near one bilaterally. He did have a moderately high frequency signal in his right common iliac artery. He denies claudication and scheduled for repeat Dopplers next week.

## 2015-08-28 NOTE — Patient Instructions (Addendum)
Medication Instructions:  Your physician has recommended you make the following change in your medication:  1) START Zetia 10 mg tablet by mouth ONCE daily    Labwork: Your physician recommends that you return for lab work in: [place] - March 2017 The lab can be found on the FIRST FLOOR of out building in [address]   Testing/Procedures: none  Follow-Up: Your physician wants you to follow-up in: 12 months with Dr. Gwenlyn Found. You will receive a reminder letter in the mail two months in advance. If you don't receive a letter, please call our office to schedule the follow-up appointment.   Any Other Special Instructions Will Be Listed Below (If Applicable).     If you need a refill on your cardiac medications before your next appointment, please call your pharmacy.

## 2015-08-28 NOTE — Assessment & Plan Note (Signed)
History of hypertension blood pressure measured at 126/70. He is on ramipril and metoprolol. Continue current meds at current dosing

## 2015-08-28 NOTE — Progress Notes (Signed)
08/28/2015 [name]   1945/09/11  [id number]  Primary Physician Maggie Font, MD Primary Cardiologist: [name] MD [name]   HPI:  The patient is a 70 year old mildly overweight married [place] American male father of 3 who I last saw in the office 08/16/14. He has a history of PVOD status post left SFA, PTA and stenting by myself back in July of 2006 with subsequent improvement in his claudication and Dopplers. These were last done in April of last year and showed ABIs of greater than 1 bilaterally. His other problems include hypertension, hyperlipidemia, non-insulin-requiring diabetes and statin intolerance. I catheterized him August 28, 2010 and stented his first OM branch with a bare metal stent. He was readmitted September 09, 2010 with recurrent chest pain and was re-cathed by Dr. Ellyn Hack via the right radial approach revealing a widely patent stent. He was admitted again June 29th through July 3rd with chest pain and ruled out for myocardial infarction. His stress test showed subtle lateral ischemia and cath performed by Dr. Claiborne Billings February 10, 2012 revealed 95% proximal in-stent restenosis within the OM stent which Dr. Claiborne Billings opened up with a cutting balloon. He had done well until December of last year when he was admitted again with chest pain. Dr. Ellyn Hack recathed him revealing again in-stent restenosis which was "restented" with a Promus drug-eluting stent. He was having chest pain when I saw him back in December, however, since that time he has had minimal chest pain and he does walk 3-5 miles a day. His most recent liver profile performed 08/22/15 revealed an LDL of 94 and HDL 47 on Zocor 20 mg a day. He was recently admitted with chest pain/rule out MI 08/30/13. This is either negative. 2-D echo was essentially unremarkable with mild inferoseptal hypokinesia and a Myoview stress test was read as low risk. His medications were adjusted. Ranexa was added..He has had no  recurrent chest pain since I saw him a year ago until recently when he was taking his Ranexa once a day instead of twice a day. He developed recurrent chest pain which resolved when he began taking his Ranexa as prescribed. He also denies claudication.   Current Outpatient Prescriptions  Medication Sig Dispense Refill  . acetaminophen (TYLENOL) 500 MG tablet Take 1,000 mg by mouth every 6 (six) hours as needed for mild pain.    Marland Kitchen aspirin (ECOTRIN) 325 MG EC tablet Take 325 mg by mouth daily.    . clopidogrel (PLAVIX) 75 MG tablet TAKE ONE TABLET BY MOUTH ONCE DAILY. 30 tablet 2  . Coenzyme Q10 (CO Q 10) 100 MG CAPS Take 400 mg by mouth daily.     Marland Kitchen DEXILANT 60 MG capsule TAKE (1) CAPSULE BY MOUTH TWICE DAILY. 60 capsule 5  . dextromethorphan-guaiFENesin (MUCINEX DM) 30-600 MG per 12 hr tablet Take 1 tablet by mouth as needed.     . fexofenadine (ALLEGRA) 180 MG tablet Take 180 mg by mouth daily.      Marland Kitchen glipiZIDE-metformin (METAGLIP) 5-500 MG per tablet Take 1 tablet by mouth every evening.    Marland Kitchen ibuprofen (ADVIL,MOTRIN) 200 MG tablet Take 400 mg by mouth daily as needed. For pain     . isosorbide mononitrate (IMDUR) 30 MG 24 hr tablet TAKE 1 TABLET EVERY DAY. 30 tablet 0  . metoprolol succinate (TOPROL-XL) 25 MG 24 hr tablet TAKE 1/2 TABLET BY MOUTH DAILY. 15 tablet 8  . mometasone (NASONEX) 50 MCG/ACT nasal spray Place 2 sprays  into the nose daily. 17 g 0  . nitroGLYCERIN (NITROSTAT) 0.4 MG SL tablet Place 1 tablet (0.4 mg total) under the tongue every 5 (five) minutes as needed. For chest pain 25 tablet 11  . pyridOXINE (VITAMIN B-6) 100 MG tablet Take 100 mg by mouth daily.    . ramipril (ALTACE) 5 MG capsule TAKE (1) CAPSULE BY MOUTH ONCE DAILY. 30 capsule 10  . RANEXA 500 MG 12 hr tablet TAKE (1) TABLET BY MOUTH TWICE DAILY. 60 tablet 9  . simvastatin (ZOCOR) 20 MG tablet Take 1 tablet (20 mg total) by mouth at bedtime. 30 tablet 6  . TOCOPHEROLS-TOCOTRIENOLS PO Take 1 tablet by mouth  daily.    . vitamin B-12 (CYANOCOBALAMIN) 1000 MCG tablet Take 1,000 mcg by mouth daily.    Marland Kitchen ezetimibe (ZETIA) 10 MG tablet Take 1 tablet (10 mg total) by mouth daily. 90 tablet 3   No current facility-administered medications for this visit.    Allergies  Allergen Reactions  . Propoxyphene N-Acetaminophen   . Statins Other (See Comments)    Severe muscle cramping/aching/pain  . Tape     Blisters  . Flomax [Tamsulosin Hcl] Rash  . Levaquin [Levofloxacin Hemihydrate] Rash  . Penicillins Rash    Social History   Social History  . Marital Status: Married    Spouse Name: N/A  . Number of Children: 3  . Years of Education: N/A   Occupational History  . LAB TECH [employer]   Social History Main Topics  . Smoking status: Former Smoker -- 37 years  . Smokeless tobacco: Not on file     Comment: quit 10 yrs ago  . Alcohol Use: No  . Drug Use: No  . Sexual Activity: No   Other Topics Concern  . Not on file   Social History Narrative     Review of Systems: General: negative for chills, fever, night sweats or weight changes.  Cardiovascular: negative for chest pain, dyspnea on exertion, edema, orthopnea, palpitations, paroxysmal nocturnal dyspnea or shortness of breath Dermatological: negative for rash Respiratory: negative for cough or wheezing Urologic: negative for hematuria Abdominal: negative for nausea, vomiting, diarrhea, bright red blood per rectum, melena, or hematemesis Neurologic: negative for visual changes, syncope, or dizziness All other systems reviewed and are otherwise negative except as noted above.    Blood pressure 126/70, pulse 59, height 6' (1.829 m), weight 219 lb 4.8 oz (99.474 kg).  General appearance: alert and no distress Neck: no adenopathy, no carotid bruit, no JVD, supple, symmetrical, trachea midline and thyroid not enlarged, symmetric, no tenderness/mass/nodules Lungs: clear to auscultation bilaterally Abdomen: soft, non-tender;  bowel sounds normal; no masses,  no organomegaly  EKG sinus bradycardia 59 without ST or T-wave changes. I personally reviewed this EKG  ASSESSMENT AND PLAN:   Stented coronary artery History of circumflex obtuse marginal branch stenting by myself with a vision bare metal stent jugular 2012. He's had several angiograms and interventions since that time by Dr. Claiborne Billings and Dr. Ellyn Hack. He was having some chest pain when he was not taking his Ranexa as prescribed recently but now his pain has resolved. He remains on dual antiplatelet therapy.  Peripheral vascular disease History of peripheral arterial disease status post left SFA PTA and stenting by myself in 2006. Recent Dopplers performed a year ago revealed this to be widely patent with ABIs near one bilaterally. He did have a moderately high frequency signal in his right common iliac artery. He denies claudication and  scheduled for repeat Dopplers next week.  Statin intolerance History of hyperlipidemia intolerant to statin therapy. He is tolerating simvastatin 20 mg a day with recent lipid profile performed 08/22/15 revealing an LDL of 94 and HDL of 47. He is still not at goal for secondary prevention. I'm going to add Zetia 10 mg a day and recheck  HTN (hypertension) History of hypertension blood pressure measured at 126/70. He is on ramipril and metoprolol. Continue current meds at current dosing      [name] MD [facility], [facility] 08/28/2015 9:09 AM

## 2015-09-03 ENCOUNTER — Ambulatory Visit (HOSPITAL_COMMUNITY)
Admission: RE | Admit: 2015-09-03 | Discharge: 2015-09-03 | Disposition: A | Discharge status: Home / Self Care | Payer: PPO | Source: Ambulatory Visit | Attending: Cardiovascular Disease | Admitting: Cardiovascular Disease

## 2015-09-03 DIAGNOSIS — E119 Type 2 diabetes mellitus without complications: Secondary | ICD-10-CM | POA: Diagnosis not present

## 2015-09-03 DIAGNOSIS — I700 Atherosclerosis of aorta: Secondary | ICD-10-CM | POA: Insufficient documentation

## 2015-09-03 DIAGNOSIS — I739 Peripheral vascular disease, unspecified: Secondary | ICD-10-CM | POA: Diagnosis not present

## 2015-09-03 DIAGNOSIS — I2510 Atherosclerotic heart disease of native coronary artery without angina pectoris: Secondary | ICD-10-CM | POA: Insufficient documentation

## 2015-09-03 DIAGNOSIS — E785 Hyperlipidemia, unspecified: Secondary | ICD-10-CM | POA: Diagnosis not present

## 2015-09-03 DIAGNOSIS — I10 Essential (primary) hypertension: Secondary | ICD-10-CM | POA: Diagnosis not present

## 2015-09-03 DIAGNOSIS — Z87891 Personal history of nicotine dependence: Secondary | ICD-10-CM | POA: Diagnosis not present

## 2015-09-03 DIAGNOSIS — I70201 Unspecified atherosclerosis of native arteries of extremities, right leg: Secondary | ICD-10-CM | POA: Insufficient documentation

## 2015-09-03 DIAGNOSIS — E291 Testicular hypofunction: Secondary | ICD-10-CM | POA: Diagnosis not present

## 2015-09-05 ENCOUNTER — Other Ambulatory Visit: Payer: Self-pay | Admitting: Gastroenterology

## 2015-09-13 ENCOUNTER — Other Ambulatory Visit: Payer: Self-pay | Admitting: Cardiovascular Disease

## 2015-09-13 NOTE — Telephone Encounter (Signed)
Rx request sent to pharmacy.  

## 2015-09-14 ENCOUNTER — Other Ambulatory Visit: Payer: Self-pay

## 2015-09-14 DIAGNOSIS — I739 Peripheral vascular disease, unspecified: Secondary | ICD-10-CM

## 2015-09-24 ENCOUNTER — Encounter: Payer: Self-pay | Admitting: Gastroenterology

## 2015-09-27 ENCOUNTER — Other Ambulatory Visit: Payer: Self-pay | Admitting: Cardiovascular Disease

## 2015-09-27 NOTE — Telephone Encounter (Signed)
Rx request sent to pharmacy.  

## 2015-10-08 ENCOUNTER — Other Ambulatory Visit: Payer: Self-pay | Admitting: Cardiovascular Disease

## 2015-10-08 DIAGNOSIS — J209 Acute bronchitis, unspecified: Secondary | ICD-10-CM | POA: Diagnosis not present

## 2015-10-08 NOTE — Telephone Encounter (Signed)
REFILL 

## 2015-10-12 ENCOUNTER — Other Ambulatory Visit: Payer: Self-pay | Admitting: Cardiology

## 2015-11-01 DIAGNOSIS — Z955 Presence of coronary angioplasty implant and graft: Secondary | ICD-10-CM | POA: Diagnosis not present

## 2015-11-01 DIAGNOSIS — I10 Essential (primary) hypertension: Secondary | ICD-10-CM | POA: Diagnosis not present

## 2015-11-01 DIAGNOSIS — E291 Testicular hypofunction: Secondary | ICD-10-CM | POA: Diagnosis not present

## 2015-11-01 DIAGNOSIS — E785 Hyperlipidemia, unspecified: Secondary | ICD-10-CM | POA: Diagnosis not present

## 2015-11-01 DIAGNOSIS — E559 Vitamin D deficiency, unspecified: Secondary | ICD-10-CM | POA: Diagnosis not present

## 2015-11-01 DIAGNOSIS — I739 Peripheral vascular disease, unspecified: Secondary | ICD-10-CM | POA: Diagnosis not present

## 2015-11-01 DIAGNOSIS — Z125 Encounter for screening for malignant neoplasm of prostate: Secondary | ICD-10-CM | POA: Diagnosis not present

## 2015-11-01 DIAGNOSIS — E1169 Type 2 diabetes mellitus with other specified complication: Secondary | ICD-10-CM | POA: Diagnosis not present

## 2015-11-12 ENCOUNTER — Other Ambulatory Visit: Payer: Self-pay | Admitting: *Deleted

## 2015-11-12 MED ORDER — CLOPIDOGREL BISULFATE 75 MG PO TABS
75.0000 mg | ORAL_TABLET | Freq: Every day | ORAL | Status: DC
Start: 2015-11-12 — End: 2015-12-07

## 2015-11-12 MED ORDER — RANOLAZINE ER 500 MG PO TB12: 500.0000 mg | ORAL_TABLET | Freq: Two times a day (BID) | ORAL | Status: DC

## 2015-11-22 ENCOUNTER — Other Ambulatory Visit: Payer: Self-pay | Admitting: Cardiovascular Disease

## 2015-11-22 NOTE — Telephone Encounter (Signed)
Rx(s) sent to pharmacy electronically.  

## 2015-12-07 ENCOUNTER — Other Ambulatory Visit: Payer: Self-pay | Admitting: Cardiovascular Disease

## 2015-12-07 NOTE — Telephone Encounter (Signed)
Rx(s) sent to pharmacy electronically.  

## 2016-01-24 ENCOUNTER — Encounter: Payer: Self-pay | Admitting: Gastroenterology

## 2016-01-24 ENCOUNTER — Ambulatory Visit (INDEPENDENT_AMBULATORY_CARE_PROVIDER_SITE_OTHER): Payer: PPO | Admitting: Gastroenterology

## 2016-01-24 VITALS — BP 119/63 | HR 63 | Temp 98.3°F | Ht 72.0 in | Wt 218.8 lb

## 2016-01-24 DIAGNOSIS — K219 Gastro-esophageal reflux disease without esophagitis: Secondary | ICD-10-CM | POA: Diagnosis not present

## 2016-01-24 NOTE — Progress Notes (Signed)
Referring Provider: Iona Beard, MD Primary Care Physician:  [name], MD  Primary GI: Dr. Gala Romney   Chief Complaint  Patient presents with  . Follow-up    HPI:   [name] is a 70 y.o. 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 Failed Protonix, Aciphex, Prevacid in the past. Last colonoscopy 2011 by Dr. Arnoldo Morale normal with repeat screening in 2021. On Dexilant for GERD last year. Due to concern for possible hepatomegaly on exam last year, [place] ordered and showed no evidence of this. He is here for routine follow-up now.   Now off [place] as insurance not covering. Protonix daily, which seems to work well. No dysphagia. No abdominal pain but notes a lot of gas and bloating. Has been taking a calcium supplement that is like a candy type of form that is "sugar-free". Has a good appetite. Has constipation. Takes a stool softener at night about 2-3 times per week, which seems to be doing well. No rectal bleeding.     Past Medical History  Diagnosis Date  . Coronary artery disease     s/p multiple caths 2012, stenting  . Diabetes mellitus   . Hypertension   . CHF (congestive heart failure) ([place])   . GERD (gastroesophageal reflux disease)   . PVD (peripheral vascular disease) ([place])     left SFA PTA & stenting in 02/2005 (Dr. Adora Fridge)  . Hyperlipidemia     Past Surgical History  Procedure Laterality Date  . Femoral artery stent  02/27/2005    L SFA stenting - [place] down SFA across lesion - predilatation with 4x4 Powerflex, stenting with 7x4 Smart, post-dilatation with 6x4 powerflex (Dr. Adora Fridge)  . 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 Colonoscopy  12/2009    Dr. Hampton Abbot  . Transthoracic echocardiogram  09/08/2013    EF  50-55%, mild LVH, grade 1 diastolic dysfunction, mildly calcified AV annulus, calcified MV, LA mildly dilated,   . Nm myocar perf wall motion  09/08/2013    abnormal lexiscan - low to intermediate risk;   . Cardiac catheterization  12/23/2004    normal L main, normal LAD, normal L Cfx, RCA with 20% hypodense lesion in first end of vessel (Dr. Adora Fridge)  . Cardiac catheterization  08/26/2007    no significant CAD by cath, EF 50% (Dr. Jackie Plum)  . Cardiac catheterization  08/28/2010    stent to OM1 with 2.0x53mm BMS (Dr. Adora Fridge)  . Cardiac catheterization  09/11/2010    patent stent (Dr. Roni Bread)  . Cardiac catheterization  02/10/2011    95% prox in-stent restenosis within OM stent - opened with cutting balloon (Dr. Corky Downs)  . Cardiac catheterization  07/17/2011    in-stent restenosis - re-stented with Promus 2.25x80mm DES (Dr. Roni Bread)  . Left heart catheterization with coronary angiogram N/A 07/17/2011    Procedure: LEFT HEART CATHETERIZATION WITH CORONARY ANGIOGRAM;  Surgeon: Leonie Man, MD;  Location: [facility] CATH LAB;  Service: Cardiovascular;  Laterality: N/A;  Right radial approach    Current Outpatient Prescriptions  Medication Sig Dispense Refill  . acetaminophen (TYLENOL) 500 MG tablet Take 1,000 mg by mouth every 6 (six) hours as needed for mild pain.    Marland Kitchen aspirin (ECOTRIN) 325 MG EC tablet Take 325 mg by  mouth daily.    . clopidogrel (PLAVIX) 75 MG tablet TAKE ONE TABLET BY MOUTH ONCE DAILY. 30 tablet 9  . Coenzyme Q10 (CO Q 10) 100 MG CAPS Take 400 mg by mouth daily.     Marland Kitchen dextromethorphan-guaiFENesin (MUCINEX DM) 30-600 MG per 12 hr tablet Take 1 tablet by mouth as needed.     . ezetimibe (ZETIA) 10 MG tablet Take 1 tablet (10 mg total) by mouth daily. 90 tablet 3  . fexofenadine (ALLEGRA) 180 MG tablet Take 180 mg by mouth daily.      Marland Kitchen glipiZIDE-metformin (METAGLIP) 5-500 MG per tablet Take 1 tablet by mouth every evening.    Marland Kitchen ibuprofen (ADVIL,MOTRIN) 200 MG tablet Take  400 mg by mouth daily as needed. For pain     . isosorbide mononitrate (IMDUR) 30 MG 24 hr tablet TAKE 1 TABLET EVERY DAY. 30 tablet 6  . metoprolol succinate (TOPROL-XL) 25 MG 24 hr tablet TAKE 1/2 TABLET BY MOUTH DAILY. 15 tablet 11  . mometasone (NASONEX) 50 MCG/ACT nasal spray Place 2 sprays into the nose daily. 17 g 0  . nitroGLYCERIN (NITROSTAT) 0.4 MG SL tablet Place 1 tablet (0.4 mg total) under the tongue every 5 (five) minutes as needed. For chest pain 25 tablet 11  . pantoprazole (PROTONIX) 40 MG tablet Take 40 mg by mouth daily.    Marland Kitchen pyridOXINE (VITAMIN B-6) 100 MG tablet Take 100 mg by mouth daily.    . ramipril (ALTACE) 5 MG capsule TAKE (1) CAPSULE BY MOUTH ONCE DAILY. 30 capsule 11  . ranolazine (RANEXA) 500 MG 12 hr tablet Take 1 tablet (500 mg total) by mouth 2 (two) times daily. 180 tablet 3  . simvastatin (ZOCOR) 20 MG tablet TAKE (1) TABLET BY MOUTH AT BEDTIME. 30 tablet 9  . TOCOPHEROLS-TOCOTRIENOLS PO Take 1 tablet by mouth daily.    . vitamin B-12 (CYANOCOBALAMIN) 1000 MCG tablet Take 1,000 mcg by mouth daily.     No current facility-administered medications for this visit.    Allergies as of 01/24/2016 - Review Complete 01/24/2016  Allergen Reaction Noted  . Propoxyphene n-acetaminophen    . Statins Other (See Comments) 07/29/2011  . Tape  07/29/2011  . Flomax [tamsulosin hcl] Rash 08/16/2014  . Levaquin [levofloxacin hemihydrate] Rash 07/29/2011  . Penicillins Rash     Family History  Problem Relation Age of Onset  . Colon cancer Neg Hx   . Liver disease Neg Hx   . Inflammatory bowel disease Neg Hx   . Arrhythmia Mother 37  . Colon polyps Neg Hx     Social History   Social History  . Marital Status: Married    Spouse Name: N/A  . Number of Children: 3  . Years of Education: N/A   Occupational History  . LAB TECH [employer]   Social History Main Topics  . Smoking status: Former Smoker -- 73 years  . Smokeless tobacco: None     Comment:  quit 10 yrs ago  . Alcohol Use: No  . Drug Use: No  . Sexual Activity: No   Other Topics Concern  . None   Social History Narrative    Review of Systems: Gen: Denies fever, chills, anorexia. Denies fatigue, weakness, weight loss.  CV: Denies chest pain, palpitations, syncope, peripheral edema, and claudication. Resp: Denies dyspnea at rest, cough, wheezing, coughing up blood, and pleurisy. GI: see HPI  Derm: Denies rash, itching, dry skin Psych: Denies depression, anxiety, memory loss, confusion. No homicidal  or suicidal ideation.  Heme: Denies bruising, bleeding, and enlarged lymph nodes.  Physical Exam: BP 119/63 mmHg  Pulse 63  Temp(Src) 98.3 F (36.8 C) (Oral)  Ht 6' (1.829 m)  Wt 218 lb 12.8 oz (99.247 kg)  BMI 29.67 kg/m2 General:   Alert and oriented. No distress noted. Pleasant and cooperative.  Head:  Normocephalic and atraumatic. Eyes:  Conjuctiva clear without scleral icterus. Mouth:  Oral mucosa pink and moist. Good dentition. No lesions. Heart:  S1, S2 present without murmurs, rubs, or gallops. Regular rate and rhythm. Abdomen:  +BS, soft, non-tender and non-distended. No rebound or guarding. No HSM or masses noted. Msk:  Symmetrical without gross deformities. Normal posture. Extremities:  Without edema. Neurologic:  Alert and  oriented x4;  grossly normal neurologically. Psych:  Alert and cooperative. Normal mood and affect.

## 2016-01-24 NOTE — Patient Instructions (Signed)
Please complete the stool sample and return to our office.   If it is positive, we will proceed with a colonoscopy.  Continue Protonix each morning on an empty stomach.  We will see you in 1 year.

## 2016-01-24 NOTE — Progress Notes (Signed)
CC'ED TO PCP 

## 2016-01-24 NOTE — Assessment & Plan Note (Signed)
70 year old male with GERD and doing well on Protonix once daily. No concerning upper or lower GI symptoms. Last colonoscopy in 2011 by Dr. Arnoldo Morale. Will check ifobt now. If positive, proceed with colonoscopy. Otherwise, return in 1 year regardless.

## 2016-01-28 ENCOUNTER — Ambulatory Visit (INDEPENDENT_AMBULATORY_CARE_PROVIDER_SITE_OTHER): Payer: PPO

## 2016-01-28 DIAGNOSIS — K219 Gastro-esophageal reflux disease without esophagitis: Secondary | ICD-10-CM | POA: Diagnosis not present

## 2016-01-28 LAB — IFOBT (OCCULT BLOOD): IFOBT: NEGATIVE

## 2016-01-28 NOTE — Progress Notes (Signed)
Pt returned IFOBT test and it was NEGATIVE 

## 2016-01-31 DIAGNOSIS — I10 Essential (primary) hypertension: Secondary | ICD-10-CM | POA: Diagnosis not present

## 2016-01-31 DIAGNOSIS — E559 Vitamin D deficiency, unspecified: Secondary | ICD-10-CM | POA: Diagnosis not present

## 2016-01-31 DIAGNOSIS — E1169 Type 2 diabetes mellitus with other specified complication: Secondary | ICD-10-CM | POA: Diagnosis not present

## 2016-01-31 DIAGNOSIS — E291 Testicular hypofunction: Secondary | ICD-10-CM | POA: Diagnosis not present

## 2016-02-04 DIAGNOSIS — E785 Hyperlipidemia, unspecified: Secondary | ICD-10-CM | POA: Diagnosis not present

## 2016-02-04 DIAGNOSIS — E1169 Type 2 diabetes mellitus with other specified complication: Secondary | ICD-10-CM | POA: Diagnosis not present

## 2016-02-04 DIAGNOSIS — E559 Vitamin D deficiency, unspecified: Secondary | ICD-10-CM | POA: Diagnosis not present

## 2016-02-04 DIAGNOSIS — I10 Essential (primary) hypertension: Secondary | ICD-10-CM | POA: Diagnosis not present

## 2016-02-13 NOTE — Progress Notes (Signed)
Quick Note:  Pt is aware. ______ 

## 2016-02-13 NOTE — Progress Notes (Signed)
Quick Note:  Heme negative. Return in 1 year. ______

## 2016-02-26 ENCOUNTER — Ambulatory Visit (INDEPENDENT_AMBULATORY_CARE_PROVIDER_SITE_OTHER): Payer: PPO | Admitting: Urology

## 2016-02-26 DIAGNOSIS — N401 Enlarged prostate with lower urinary tract symptoms: Secondary | ICD-10-CM

## 2016-04-15 DIAGNOSIS — M25562 Pain in left knee: Secondary | ICD-10-CM | POA: Diagnosis not present

## 2016-04-19 DIAGNOSIS — M25562 Pain in left knee: Secondary | ICD-10-CM | POA: Diagnosis not present

## 2016-04-25 DIAGNOSIS — M25562 Pain in left knee: Secondary | ICD-10-CM | POA: Diagnosis not present

## 2016-05-06 DIAGNOSIS — E1169 Type 2 diabetes mellitus with other specified complication: Secondary | ICD-10-CM | POA: Diagnosis not present

## 2016-05-06 DIAGNOSIS — E785 Hyperlipidemia, unspecified: Secondary | ICD-10-CM | POA: Diagnosis not present

## 2016-05-06 DIAGNOSIS — I10 Essential (primary) hypertension: Secondary | ICD-10-CM | POA: Diagnosis not present

## 2016-05-06 DIAGNOSIS — E559 Vitamin D deficiency, unspecified: Secondary | ICD-10-CM | POA: Diagnosis not present

## 2016-05-06 DIAGNOSIS — Z713 Dietary counseling and surveillance: Secondary | ICD-10-CM | POA: Diagnosis not present

## 2016-05-06 DIAGNOSIS — J209 Acute bronchitis, unspecified: Secondary | ICD-10-CM | POA: Diagnosis not present

## 2016-05-06 DIAGNOSIS — Z125 Encounter for screening for malignant neoplasm of prostate: Secondary | ICD-10-CM | POA: Diagnosis not present

## 2016-05-06 DIAGNOSIS — E291 Testicular hypofunction: Secondary | ICD-10-CM | POA: Diagnosis not present

## 2016-05-11 HISTORY — PX: OTHER SURGICAL HISTORY: SHX169

## 2016-05-20 DIAGNOSIS — Z23 Encounter for immunization: Secondary | ICD-10-CM | POA: Diagnosis not present

## 2016-05-27 ENCOUNTER — Telehealth: Payer: Self-pay | Admitting: *Deleted

## 2016-05-27 NOTE — Telephone Encounter (Signed)
Requesting surgical clearance:   1. Type of surgery: left knee scope  2. Surgeon: Dr Kathryne Hitch  3. Surgical date: pending  4. Medications that need to be held: aspirin and Plavix  5. CAD: Yes     6. I will defer to: Dr Pryor Ochoa Fax- [phone] Phone- [phone]  [phone]

## 2016-05-28 NOTE — Telephone Encounter (Signed)
Okay to interrupt antiplatelet therapy for arthroscopy

## 2016-05-29 NOTE — Telephone Encounter (Signed)
Encounter routed to Attn Sherri to number provided via EPIC.

## 2016-05-29 NOTE — Telephone Encounter (Signed)
Patient may hold Plavix and Aspirin for 7 days prior to procedure per pharmacy protocol.  Encounter re-faxed via EPIC.

## 2016-06-09 DIAGNOSIS — Y999 Unspecified external cause status: Secondary | ICD-10-CM | POA: Diagnosis not present

## 2016-06-09 DIAGNOSIS — M6752 Plica syndrome, left knee: Secondary | ICD-10-CM | POA: Diagnosis not present

## 2016-06-09 DIAGNOSIS — M94262 Chondromalacia, left knee: Secondary | ICD-10-CM | POA: Diagnosis not present

## 2016-06-09 DIAGNOSIS — S83232A Complex tear of medial meniscus, current injury, left knee, initial encounter: Secondary | ICD-10-CM | POA: Diagnosis not present

## 2016-06-09 DIAGNOSIS — S83242A Other tear of medial meniscus, current injury, left knee, initial encounter: Secondary | ICD-10-CM | POA: Diagnosis not present

## 2016-06-09 DIAGNOSIS — G8918 Other acute postprocedural pain: Secondary | ICD-10-CM | POA: Diagnosis not present

## 2016-06-09 DIAGNOSIS — M2242 Chondromalacia patellae, left knee: Secondary | ICD-10-CM | POA: Diagnosis not present

## 2016-06-17 DIAGNOSIS — M25562 Pain in left knee: Secondary | ICD-10-CM | POA: Diagnosis not present

## 2016-07-22 DIAGNOSIS — M25562 Pain in left knee: Secondary | ICD-10-CM | POA: Diagnosis not present

## 2016-07-23 DIAGNOSIS — H2513 Age-related nuclear cataract, bilateral: Secondary | ICD-10-CM | POA: Diagnosis not present

## 2016-07-23 DIAGNOSIS — E119 Type 2 diabetes mellitus without complications: Secondary | ICD-10-CM | POA: Diagnosis not present

## 2016-07-23 DIAGNOSIS — H40013 Open angle with borderline findings, low risk, bilateral: Secondary | ICD-10-CM | POA: Diagnosis not present

## 2016-07-31 DIAGNOSIS — E291 Testicular hypofunction: Secondary | ICD-10-CM | POA: Diagnosis not present

## 2016-07-31 DIAGNOSIS — Z0000 Encounter for general adult medical examination without abnormal findings: Secondary | ICD-10-CM | POA: Diagnosis not present

## 2016-07-31 DIAGNOSIS — Z23 Encounter for immunization: Secondary | ICD-10-CM | POA: Diagnosis not present

## 2016-07-31 DIAGNOSIS — E1169 Type 2 diabetes mellitus with other specified complication: Secondary | ICD-10-CM | POA: Diagnosis not present

## 2016-07-31 DIAGNOSIS — I739 Peripheral vascular disease, unspecified: Secondary | ICD-10-CM | POA: Diagnosis not present

## 2016-07-31 DIAGNOSIS — I10 Essential (primary) hypertension: Secondary | ICD-10-CM | POA: Diagnosis not present

## 2016-09-01 ENCOUNTER — Other Ambulatory Visit: Payer: Self-pay | Admitting: Cardiovascular Disease

## 2016-09-01 DIAGNOSIS — I739 Peripheral vascular disease, unspecified: Secondary | ICD-10-CM

## 2016-09-02 DIAGNOSIS — M25562 Pain in left knee: Secondary | ICD-10-CM | POA: Diagnosis not present

## 2016-09-03 ENCOUNTER — Ambulatory Visit (HOSPITAL_COMMUNITY)
Admission: RE | Admit: 2016-09-03 | Discharge: 2016-09-03 | Disposition: A | Discharge status: Home / Self Care | Payer: PPO | Source: Ambulatory Visit | Attending: Cardiovascular Disease | Admitting: Cardiovascular Disease

## 2016-09-03 ENCOUNTER — Ambulatory Visit (INDEPENDENT_AMBULATORY_CARE_PROVIDER_SITE_OTHER): Payer: PPO | Admitting: Cardiovascular Disease

## 2016-09-03 ENCOUNTER — Encounter: Payer: Self-pay | Admitting: Cardiovascular Disease

## 2016-09-03 VITALS — BP 146/74 | HR 75 | Ht 72.0 in | Wt 216.8 lb

## 2016-09-03 DIAGNOSIS — E785 Hyperlipidemia, unspecified: Secondary | ICD-10-CM | POA: Diagnosis not present

## 2016-09-03 DIAGNOSIS — I743 Embolism and thrombosis of arteries of the lower extremities: Secondary | ICD-10-CM | POA: Diagnosis not present

## 2016-09-03 DIAGNOSIS — R9439 Abnormal result of other cardiovascular function study: Secondary | ICD-10-CM | POA: Diagnosis not present

## 2016-09-03 DIAGNOSIS — I10 Essential (primary) hypertension: Secondary | ICD-10-CM | POA: Diagnosis not present

## 2016-09-03 DIAGNOSIS — I739 Peripheral vascular disease, unspecified: Secondary | ICD-10-CM | POA: Diagnosis not present

## 2016-09-03 DIAGNOSIS — I2510 Atherosclerotic heart disease of native coronary artery without angina pectoris: Secondary | ICD-10-CM | POA: Diagnosis not present

## 2016-09-03 NOTE — Assessment & Plan Note (Signed)
History of dyslipidemia followed by his PCP. He is on simvastatin.

## 2016-09-03 NOTE — Assessment & Plan Note (Signed)
History of hypertension blood pressure measures 146/74. He is on metoprolol and ramipril. Continue current meds at current dosing

## 2016-09-03 NOTE — Patient Instructions (Signed)
Medication Instructions: Your physician recommends that you continue on your current medications as directed. Please refer to the Current Medication list given to you today.   Follow-Up: Your physician wants you to follow-up in: 12 months with Dr. Gwenlyn Found. You will receive a reminder letter in the mail two months in advance. If you don't receive a letter, please call our office to schedule the follow-up appointment.  If you need a refill on your cardiac medications before your next appointment, please call your pharmacy.

## 2016-09-03 NOTE — Progress Notes (Signed)
09/03/2016 [facility] LYNG   1946/05/16  [id number]  Primary Physician Ricky Albee, MD Primary Cardiologist: Ricky Harp MD [name]  HPI:  The patient is a 71 year old mildly overweight married African American male father of 3 who I last saw in the office 08/28/15. He has a history of PVOD status post left SFA, PTA and stenting by myself back in July of 2006 with subsequent improvement in his claudication and Dopplers. These were last done in April of last year and showed ABIs of greater than 1 bilaterally. His other problems include hypertension, hyperlipidemia, non-insulin-requiring diabetes and statin intolerance. I catheterized him August 28, 2010 and stented his first OM branch with a bare metal stent. He was readmitted September 09, 2010 with recurrent chest pain and was re-cathed by Dr. Ellyn Hack via the right radial approach revealing a widely patent stent. He was admitted again June 29th through July 3rd with chest pain and ruled out for myocardial infarction. His stress test showed subtle lateral ischemia and cath performed by Dr. Claiborne Billings February 10, 2012 revealed 95% proximal in-stent restenosis within the OM stent which Dr. Claiborne Billings opened up with a cutting balloon. He had done well until December of last year when he was admitted again with chest pain. Dr. Ellyn Hack recathed him revealing again in-stent restenosis which was "restented" with a Promus drug-eluting stent. He was having chest pain when I saw him back in December, however, since that time he has had minimal chest pain and he does walk 3-5 miles a day. His most recent liver profile performed 08/22/15 revealed an LDL of 94 and HDL 47 on Zocor 20 mg a day. He was recently admitted with chest pain/rule out MI 08/30/13. This is either negative. 2-D echo was essentially unremarkable with mild inferoseptal hypokinesia and a Myoview stress test was read as low risk. His medications were adjusted. Ranexa was added..He has had no  recurrent chest pain since I saw him a year ago until recently when he was taking his Ranexa once a day instead of twice a day. Since I saw him a year ago he denies chest pain or claudication.  Current Outpatient Prescriptions  Medication Sig Dispense Refill  . acetaminophen (TYLENOL) 500 MG tablet Take 1,000 mg by mouth every 6 (six) hours as needed for mild pain.    Marland Kitchen aspirin (ECOTRIN) 325 MG EC tablet Take 325 mg by mouth daily.    . clopidogrel (PLAVIX) 75 MG tablet TAKE ONE TABLET BY MOUTH ONCE DAILY. 30 tablet 9  . Coenzyme Q10 (CO Q 10) 100 MG CAPS Take 400 mg by mouth daily.     Marland Kitchen dextromethorphan-guaiFENesin (MUCINEX DM) 30-600 MG per 12 hr tablet Take 1 tablet by mouth as needed.     . fexofenadine (ALLEGRA) 180 MG tablet Take 180 mg by mouth daily.      Marland Kitchen glipiZIDE-metformin (METAGLIP) 5-500 MG per tablet Take 1 tablet by mouth every evening.    Marland Kitchen ibuprofen (ADVIL,MOTRIN) 200 MG tablet Take 400 mg by mouth daily as needed. For pain     . isosorbide mononitrate (IMDUR) 30 MG 24 hr tablet TAKE 1 TABLET EVERY DAY. 30 tablet 6  . metoprolol succinate (TOPROL-XL) 25 MG 24 hr tablet TAKE 1/2 TABLET BY MOUTH DAILY. 15 tablet 11  . nitroGLYCERIN (NITROSTAT) 0.4 MG SL tablet Place 1 tablet (0.4 mg total) under the tongue every 5 (five) minutes as needed. For chest pain 25 tablet 11  . pantoprazole (PROTONIX)  40 MG tablet Take 40 mg by mouth daily.    Marland Kitchen pyridOXINE (VITAMIN B-6) 100 MG tablet Take 100 mg by mouth daily.    . ramipril (ALTACE) 5 MG capsule TAKE (1) CAPSULE BY MOUTH ONCE DAILY. 30 capsule 11  . ranolazine (RANEXA) 500 MG 12 hr tablet Take 1 tablet (500 mg total) by mouth 2 (two) times daily. 180 tablet 3  . simvastatin (ZOCOR) 20 MG tablet TAKE (1) TABLET BY MOUTH AT BEDTIME. 30 tablet 9  . TOCOPHEROLS-TOCOTRIENOLS PO Take 1 tablet by mouth daily.    . vitamin B-12 (CYANOCOBALAMIN) 1000 MCG tablet Take 1,000 mcg by mouth daily.     No current facility-administered medications  for this visit.     Allergies  Allergen Reactions  . Propoxyphene N-Acetaminophen   . Statins Other (See Comments)    Severe muscle cramping/aching/pain  . Tape     Blisters  . Flomax [Tamsulosin Hcl] Rash  . Levaquin [Levofloxacin Hemihydrate] Rash  . Penicillins Rash    Social History   Social History  . Marital status: Married    Spouse name: N/A  . Number of children: 3  . Years of education: N/A   Occupational History  . Retired [employer]   Social History Main Topics  . Smoking status: Never Smoker  . Smokeless tobacco: Never Used     Comment: quit 10 yrs ago  . Alcohol use No  . Drug use: No  . Sexual activity: No   Other Topics Concern  . Not on file   Social History Narrative  . No narrative on file     Review of Systems: General: negative for chills, fever, night sweats or weight changes.  Cardiovascular: negative for chest pain, dyspnea on exertion, edema, orthopnea, palpitations, paroxysmal nocturnal dyspnea or shortness of breath Dermatological: negative for rash Respiratory: negative for cough or wheezing Urologic: negative for hematuria Abdominal: negative for nausea, vomiting, diarrhea, bright red blood per rectum, melena, or hematemesis Neurologic: negative for visual changes, syncope, or dizziness All other systems reviewed and are otherwise negative except as noted above.    Blood pressure (!) 146/74, pulse 75, height 6' (1.829 m), weight 216 lb 12.8 oz (98.3 kg).  General appearance: alert and no distress Neck: no adenopathy, no carotid bruit, no JVD, supple, symmetrical, trachea midline and thyroid not enlarged, symmetric, no tenderness/mass/nodules Lungs: clear to auscultation bilaterally Heart: regular rate and rhythm, S1, S2 normal, no murmur, click, rub or gallop Extremities: extremities normal, atraumatic, no cyanosis or edema  EKG sinus rhythm 75 without ST or T-wave changes. I personally reviewed this EKG  ASSESSMENT AND  PLAN:   Peripheral vascular disease History of peripheral vascular disease status post left SFA PTA by myself in July 2006 with subsequent improvement in his Dopplers and claudication. He gets Dopplers on annual basis which were recently performed 09/03/15 revealing normal ABIs bilaterally with a peak stent. He is scheduled for Dopplers today.  CAD ISR OM1 Rx'd with DES this admission History of CAD status post first OM branch stenting by myself 08/28/2010. Because of recurrent chest pain he underwent repeat cath by Dr. Ellyn Hack 09/09/2010 due to right radial approach revealing a widely patent stent. He had repeat catheterization by Dr. Claiborne Billings 02/10/12 revealing a 95% proximal in-stent restenosis within the first OM stent which was opened with a cutting balloon in December he was admitted with chest pain of that year and was recathed by Dr. Ellyn Hack revealing "in-stent restenosis" and was restented with  a Promus drug-eluting stent. Since I saw him a year ago he's had no recurrent chest pain.  Dyslipidemia History of dyslipidemia followed by his PCP. He is on simvastatin.  HTN (hypertension) History of hypertension blood pressure measures 146/74. He is on metoprolol and ramipril. Continue current meds at current dosing      Ricky Harp MD [facility], [facility] 09/03/2016 9:13 AM

## 2016-09-03 NOTE — Assessment & Plan Note (Signed)
History of CAD status post first OM branch stenting by myself 08/28/2010. Because of recurrent chest pain he underwent repeat cath by Dr. Ellyn Hack 09/09/2010 due to right radial approach revealing a widely patent stent. He had repeat catheterization by Dr. Claiborne Billings 02/10/12 revealing a 95% proximal in-stent restenosis within the first OM stent which was opened with a cutting balloon in December he was admitted with chest pain of that year and was recathed by Dr. Ellyn Hack revealing "in-stent restenosis" and was restented with a Promus drug-eluting stent. Since I saw him a year ago he's had no recurrent chest pain.

## 2016-09-03 NOTE — Assessment & Plan Note (Signed)
History of peripheral vascular disease status post left SFA PTA by myself in July 2006 with subsequent improvement in his Dopplers and claudication. He gets Dopplers on annual basis which were recently performed 09/03/15 revealing normal ABIs bilaterally with a peak stent. He is scheduled for Dopplers today.

## 2016-09-05 ENCOUNTER — Other Ambulatory Visit: Payer: Self-pay | Admitting: Cardiovascular Disease

## 2016-09-05 DIAGNOSIS — I739 Peripheral vascular disease, unspecified: Secondary | ICD-10-CM

## 2016-09-07 ENCOUNTER — Encounter (HOSPITAL_COMMUNITY): Payer: Self-pay | Admitting: Emergency Medicine

## 2016-09-07 ENCOUNTER — Emergency Department (HOSPITAL_COMMUNITY): Payer: PPO

## 2016-09-07 ENCOUNTER — Emergency Department (HOSPITAL_COMMUNITY)
Admission: EM | Admit: 2016-09-07 | Discharge: 2016-09-07 | Disposition: A | Discharge status: Home / Self Care | Payer: PPO | Attending: Emergency Medicine | Admitting: Emergency Medicine

## 2016-09-07 DIAGNOSIS — Y929 Unspecified place or not applicable: Secondary | ICD-10-CM | POA: Diagnosis not present

## 2016-09-07 DIAGNOSIS — I110 Hypertensive heart disease with heart failure: Secondary | ICD-10-CM | POA: Insufficient documentation

## 2016-09-07 DIAGNOSIS — E119 Type 2 diabetes mellitus without complications: Secondary | ICD-10-CM | POA: Diagnosis not present

## 2016-09-07 DIAGNOSIS — Z79899 Other long term (current) drug therapy: Secondary | ICD-10-CM | POA: Insufficient documentation

## 2016-09-07 DIAGNOSIS — Y939 Activity, unspecified: Secondary | ICD-10-CM | POA: Diagnosis not present

## 2016-09-07 DIAGNOSIS — W228XXA Striking against or struck by other objects, initial encounter: Secondary | ICD-10-CM | POA: Diagnosis not present

## 2016-09-07 DIAGNOSIS — S7012XA Contusion of left thigh, initial encounter: Secondary | ICD-10-CM | POA: Insufficient documentation

## 2016-09-07 DIAGNOSIS — Y999 Unspecified external cause status: Secondary | ICD-10-CM | POA: Insufficient documentation

## 2016-09-07 DIAGNOSIS — Z7982 Long term (current) use of aspirin: Secondary | ICD-10-CM | POA: Diagnosis not present

## 2016-09-07 DIAGNOSIS — M79652 Pain in left thigh: Secondary | ICD-10-CM | POA: Diagnosis not present

## 2016-09-07 DIAGNOSIS — S70922A Unspecified superficial injury of left thigh, initial encounter: Secondary | ICD-10-CM | POA: Diagnosis not present

## 2016-09-07 DIAGNOSIS — Z7984 Long term (current) use of oral hypoglycemic drugs: Secondary | ICD-10-CM | POA: Diagnosis not present

## 2016-09-07 DIAGNOSIS — I2510 Atherosclerotic heart disease of native coronary artery without angina pectoris: Secondary | ICD-10-CM | POA: Insufficient documentation

## 2016-09-07 DIAGNOSIS — M79605 Pain in left leg: Secondary | ICD-10-CM

## 2016-09-07 DIAGNOSIS — I509 Heart failure, unspecified: Secondary | ICD-10-CM | POA: Diagnosis not present

## 2016-09-07 DIAGNOSIS — S79922A Unspecified injury of left thigh, initial encounter: Secondary | ICD-10-CM | POA: Diagnosis not present

## 2016-09-07 DIAGNOSIS — M79662 Pain in left lower leg: Secondary | ICD-10-CM | POA: Diagnosis not present

## 2016-09-07 MED ORDER — OXYCODONE-ACETAMINOPHEN 5-325 MG PO TABS: 1.0000 | ORAL_TABLET | Freq: Two times a day (BID) | ORAL | 0 refills | Status: DC | PRN

## 2016-09-07 MED ORDER — OXYCODONE-ACETAMINOPHEN 5-325 MG PO TABS
1.0000 | ORAL_TABLET | Freq: Once | ORAL | Status: AC
  Administered 2016-09-07: 1 via ORAL
  Filled 2016-09-07: qty 1

## 2016-09-07 NOTE — ED Notes (Signed)
Pt reports that he was using a wood splitter on Monday Piece of oak fell and hit him on is left lateral thigh- He saw Dr Maryla Morrow on Tuesday and was told he had a deep bruise and  Was not x rayed  He reports painful movement and wonders if he has a chipped bone or something  He has bruising and swellling to his distal and lateral L knee area Sensation intact

## 2016-09-07 NOTE — ED Notes (Signed)
Returned from radiology. 

## 2016-09-07 NOTE — ED Provider Notes (Signed)
[facility] DEPT Provider Note   CSN: [id number] Arrival date & time: 09/07/16  [id number]  By signing my name below, I, Sonum Patel, attest that this documentation has been prepared under the direction and in the presence of Merrily Pew, MD. Electronically Signed: Sonum Patel, Education administrator. 09/07/16. 12:18 PM.  History   Chief Complaint Chief Complaint  Patient presents with  . Leg Injury    The history is provided by the patient. No language interpreter was used.     HPI Comments: [name] is a 71 y.o. male who presents to the Emergency Department complaining of constant, unchanged left thigh pain that has been ongoing for 1 week. He states he injured the affected area when a piece of wood fell and hit him there. He was seen by Dr. Percell Miller but did not have any imaging done at that time. He has taken Tylenol and applied ice without significant relief. He takes Plavix daily.    Past Medical History:  Diagnosis Date  . CHF (congestive heart failure) ([place])   . Coronary artery disease    s/p multiple caths 2012, stenting  . Diabetes mellitus   . GERD (gastroesophageal reflux disease)   . Hyperlipidemia   . Hypertension   . PVD (peripheral vascular disease) ([place])    left SFA PTA & stenting in 02/2005 (Dr. Adora Fridge)    Patient Active Problem List   Diagnosis Date Noted  . Hepatomegaly 11/08/2014  . Chest pain 09/07/2013  . HTN (hypertension) 09/07/2013  . CAD ISR OM1 Rx'd with DES this admission 07/16/2011  . Stented coronary artery 07/16/2011  . DM (diabetes mellitus) ([place]) 07/16/2011  . Dyslipidemia 07/16/2011  . Statin intolerance 07/16/2011  . Peripheral vascular disease ([place]) 01/10/2010  . GASTROESOPHAGEAL REFLUX DISEASE, SEVERE 01/10/2010    Past Surgical History:  Procedure Laterality Date  . CARDIAC CATHETERIZATION  12/23/2004   normal L main, normal LAD, normal L Cfx, RCA with 20% hypodense lesion in first end of vessel (Dr. Adora Fridge)  . CARDIAC CATHETERIZATION   08/26/2007   no significant CAD by cath, EF 50% (Dr. Jackie Plum)  . CARDIAC CATHETERIZATION  08/28/2010   stent to OM1 with 2.0x32mm BMS (Dr. Adora Fridge)  . CARDIAC CATHETERIZATION  09/11/2010   patent stent (Dr. Roni Bread)  . CARDIAC CATHETERIZATION  02/10/2011   95% prox in-stent restenosis within OM stent - opened with cutting balloon (Dr. Corky Downs)  . CARDIAC CATHETERIZATION  07/17/2011   in-stent restenosis - re-stented with Promus 2.25x68mm DES (Dr. Roni Bread)  . COLONOSCOPY  12/2009   Dr. Hampton Abbot  . 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 FEMORAL ARTERY STENT  02/27/2005   L SFA stenting - Wholey down SFA across lesion - predilatation with 4x4 Powerflex, stenting with 7x4 Smart, post-dilatation with 6x4 powerflex (Dr. Adora Fridge)  . LEFT HEART CATHETERIZATION WITH CORONARY ANGIOGRAM N/A 07/17/2011   Procedure: LEFT HEART CATHETERIZATION WITH CORONARY ANGIOGRAM;  Surgeon: Leonie Man, MD;  Location: [facility] CATH LAB;  Service: Cardiovascular;  Laterality: N/A;  Right radial approach  . NM MYOCAR PERF WALL MOTION  09/08/2013   abnormal lexiscan - low to intermediate risk;   . TRANSTHORACIC ECHOCARDIOGRAM  09/08/2013   EF 50-55%, mild LVH, grade 1 diastolic dysfunction, mildly calcified AV annulus, calcified MV, LA mildly dilated,        Home Medications    Prior to Admission medications  Medication Sig Start Date End Date Taking? Authorizing Provider  acetaminophen (TYLENOL) 500 MG tablet Take 1,000 mg by mouth every 6 (six) hours as needed for mild pain.   Yes Historical Provider, MD  aspirin (ECOTRIN) 325 MG EC tablet Take 325 mg by mouth daily.   Yes Historical Provider, MD  clopidogrel (PLAVIX) 75 MG tablet TAKE ONE TABLET BY MOUTH ONCE DAILY. 12/07/15  Yes [name], MD  Coenzyme Q10 (CO Q 10) 100 MG CAPS Take 400 mg by mouth daily.    Yes Historical  Provider, MD  fexofenadine (ALLEGRA) 180 MG tablet Take 180 mg by mouth daily.     Yes Historical Provider, MD  glipiZIDE-metformin (METAGLIP) 5-500 MG per tablet Take 1 tablet by mouth every evening.   Yes Historical Provider, MD  isosorbide mononitrate (IMDUR) 30 MG 24 hr tablet TAKE 1 TABLET EVERY DAY. 10/12/15  Yes [name], MD  metoprolol succinate (TOPROL-XL) 25 MG 24 hr tablet TAKE 1/2 TABLET BY MOUTH DAILY. 09/27/15  Yes [name], MD  pantoprazole (PROTONIX) 40 MG tablet Take 40 mg by mouth daily.   Yes Historical Provider, MD  ramipril (ALTACE) 5 MG capsule TAKE (1) CAPSULE BY MOUTH ONCE DAILY. 09/13/15  Yes [name], MD  ranolazine (RANEXA) 500 MG 12 hr tablet Take 1 tablet (500 mg total) by mouth 2 (two) times daily. 11/12/15  Yes [name], MD  simvastatin (ZOCOR) 20 MG tablet TAKE (1) TABLET BY MOUTH AT BEDTIME. 11/22/15  Yes [name], MD  nitroGLYCERIN (NITROSTAT) 0.4 MG SL tablet Place 1 tablet (0.4 mg total) under the tongue every 5 (five) minutes as needed. For chest pain 05/03/15   [name], MD  oxyCODONE-acetaminophen (PERCOCET) 5-325 MG tablet Take 1 tablet by mouth every 12 (twelve) hours as needed for severe pain. 09/07/16   Merrily Pew, MD  testosterone cypionate (DEPOTESTOSTERONE CYPIONATE) 200 MG/ML injection Inject 100 mg into the muscle once a week.  06/24/16   Historical Provider, MD    Family History Family History  Problem Relation Age of Onset  . Arrhythmia Mother 9  . Colon cancer Neg Hx   . Liver disease Neg Hx   . Inflammatory bowel disease Neg Hx   . Colon polyps Neg Hx     Social History Social History  Substance Use Topics  . Smoking status: Never Smoker  . Smokeless tobacco: Never Used     Comment: quit 10 yrs ago  . Alcohol use No     Allergies   Propoxyphene n-acetaminophen; Statins; Tape; Flomax [tamsulosin hcl]; Levaquin [levofloxacin hemihydrate]; and Penicillins   Review of Systems Review of  Systems  Musculoskeletal: Positive for myalgias.  Neurological: Negative for weakness.  All other systems reviewed and are negative.    Physical Exam Updated Vital Signs BP 110/59 (BP Location: Right Arm)   Pulse 71   Temp 97.8 F (36.6 C) (Oral)   Resp 16   Ht 6' (1.829 m)   Wt 216 lb (98 kg)   SpO2 96%   BMI 29.29 kg/m   Physical Exam  Constitutional: He is oriented to person, place, and time. He appears well-developed and well-nourished. No distress.  HENT:  Head: Normocephalic and atraumatic.  Eyes: Conjunctivae and EOM are normal.  Neck: Neck supple. No tracheal deviation present.  Cardiovascular: Normal rate.   Strong left DP and PT pulses  Pulmonary/Chest: Effort normal. No respiratory distress.  Musculoskeletal: Normal range of motion.  Neurological: He  is alert and oriented to person, place, and time.  Skin: Skin is warm and dry.  Psychiatric: He has a normal mood and affect. His behavior is normal.  Nursing note and vitals reviewed.    ED Treatments / Results  DIAGNOSTIC STUDIES: Oxygen Saturation is 98% on RA, normal by my interpretation.    COORDINATION OF CARE: 11:21 AM Discussed treatment plan with pt at bedside and pt agreed to plan.    Labs (all labs ordered are listed, but only abnormal results are displayed) Labs Reviewed - No data to display  EKG  EKG Interpretation None       Radiology Dg Femur Min 2 Views Left  Result Date: 09/07/2016 CLINICAL DATA:  Patient with leg injury from hydraulic wood splinter. Left leg pain. Initial encounter. EXAM: LEFT FEMUR 2 VIEWS COMPARISON:  None. FINDINGS: Normal anatomic alignment. No evidence for acute fracture or dislocation. Stent graft material within the soft tissues. IMPRESSION: No acute osseous abnormality. Electronically Signed   By: Lovey Newcomer M.D.   On: 09/07/2016 12:20    Procedures Procedures (including critical care time)  Medications Ordered in ED Medications    oxyCODONE-acetaminophen (PERCOCET/ROXICET) 5-325 MG per tablet 1 tablet (1 tablet Oral Given 09/07/16 1139)     Initial Impression / Assessment and Plan / ED Course  I have reviewed the triage vital signs and the nursing notes.  Pertinent labs & imaging results that were available during my care of the patient were reviewed by me and considered in my medical decision making (see chart for details).     Contusion of left leg. Supportive care explained. No e/o fracture. Plan for dc and pcp follow up as needed. No e/o arterial/venous injury.   Final Clinical Impressions(s) / ED Diagnoses   Final diagnoses:  Pain of left lower extremity  Contusion of left thigh, initial encounter    New Prescriptions Discharge Medication List as of 09/07/2016 12:55 PM    START taking these medications   Details  oxyCODONE-acetaminophen (PERCOCET) 5-325 MG tablet Take 1 tablet by mouth every 12 (twelve) hours as needed for severe pain., Starting Sun 09/07/2016, Print       I personally performed the services described in this documentation, which was scribed in my presence. The recorded information has been reviewed and is accurate.     Merrily Pew, MD 09/07/16 [phone]

## 2016-09-07 NOTE — ED Triage Notes (Signed)
Pt states he was splitting wood with hydraulic splitter and a piece kicked off and hit in left thigh.  Has been having pain in left leg since then.

## 2016-09-18 DIAGNOSIS — B349 Viral infection, unspecified: Secondary | ICD-10-CM | POA: Diagnosis not present

## 2016-09-29 ENCOUNTER — Other Ambulatory Visit: Payer: Self-pay | Admitting: Cardiovascular Disease

## 2016-09-30 DIAGNOSIS — M25562 Pain in left knee: Secondary | ICD-10-CM | POA: Diagnosis not present

## 2016-10-30 DIAGNOSIS — I10 Essential (primary) hypertension: Secondary | ICD-10-CM | POA: Diagnosis not present

## 2016-10-30 DIAGNOSIS — E1169 Type 2 diabetes mellitus with other specified complication: Secondary | ICD-10-CM | POA: Diagnosis not present

## 2016-10-30 DIAGNOSIS — E291 Testicular hypofunction: Secondary | ICD-10-CM | POA: Diagnosis not present

## 2016-10-30 DIAGNOSIS — M791 Myalgia: Secondary | ICD-10-CM | POA: Diagnosis not present

## 2016-11-03 DIAGNOSIS — I10 Essential (primary) hypertension: Secondary | ICD-10-CM | POA: Diagnosis not present

## 2016-11-03 DIAGNOSIS — E1169 Type 2 diabetes mellitus with other specified complication: Secondary | ICD-10-CM | POA: Diagnosis not present

## 2016-11-03 DIAGNOSIS — E291 Testicular hypofunction: Secondary | ICD-10-CM | POA: Diagnosis not present

## 2016-11-03 DIAGNOSIS — E785 Hyperlipidemia, unspecified: Secondary | ICD-10-CM | POA: Diagnosis not present

## 2016-11-11 DIAGNOSIS — J209 Acute bronchitis, unspecified: Secondary | ICD-10-CM | POA: Diagnosis not present

## 2016-11-11 DIAGNOSIS — E785 Hyperlipidemia, unspecified: Secondary | ICD-10-CM | POA: Diagnosis not present

## 2016-11-17 DIAGNOSIS — J209 Acute bronchitis, unspecified: Secondary | ICD-10-CM | POA: Diagnosis not present

## 2016-11-24 ENCOUNTER — Ambulatory Visit (HOSPITAL_COMMUNITY)
Admission: RE | Admit: 2016-11-24 | Discharge: 2016-11-24 | Disposition: A | Discharge status: Home / Self Care | Payer: PPO | Source: Ambulatory Visit | Attending: Internal Medicine | Admitting: Internal Medicine

## 2016-11-24 ENCOUNTER — Other Ambulatory Visit (HOSPITAL_COMMUNITY): Payer: Self-pay | Admitting: Internal Medicine

## 2016-11-24 DIAGNOSIS — R0600 Dyspnea, unspecified: Secondary | ICD-10-CM

## 2016-11-24 DIAGNOSIS — R918 Other nonspecific abnormal finding of lung field: Secondary | ICD-10-CM | POA: Insufficient documentation

## 2016-12-02 ENCOUNTER — Other Ambulatory Visit: Payer: Self-pay | Admitting: Pharmacy Technician

## 2016-12-02 NOTE — Patient Outreach (Signed)
Contacted [name] in reference to medication adherence for [employer]. Patient is now receiving the majority of his mediation's through the [place]. He states that he is currently taking medication's as prescribed and does not have any issues remembering to take them.  The patient is on Glipizide/Metformin, Ramipril, and Simvastatin. Doreene Burke, [place] [phone]

## 2016-12-08 ENCOUNTER — Other Ambulatory Visit: Payer: Self-pay | Admitting: Gastroenterology

## 2016-12-08 NOTE — Telephone Encounter (Signed)
According to our notes, he is on pantoprazole. Why are we getting refill request for Dexilant?

## 2016-12-10 ENCOUNTER — Encounter: Payer: Self-pay | Admitting: Gastroenterology

## 2016-12-10 NOTE — Telephone Encounter (Signed)
I called pt and he said the Pantoprazole did not help as much as the Dexilant and he would like to go back on the [place].

## 2017-01-09 ENCOUNTER — Encounter: Payer: Self-pay | Admitting: Gastroenterology

## 2017-01-09 ENCOUNTER — Ambulatory Visit (INDEPENDENT_AMBULATORY_CARE_PROVIDER_SITE_OTHER): Payer: PPO | Admitting: Gastroenterology

## 2017-01-09 DIAGNOSIS — R195 Other fecal abnormalities: Secondary | ICD-10-CM

## 2017-01-09 DIAGNOSIS — K219 Gastro-esophageal reflux disease without esophagitis: Secondary | ICD-10-CM

## 2017-01-09 MED ORDER — DEXLANSOPRAZOLE 60 MG PO CPDR: 60.0000 mg | DELAYED_RELEASE_CAPSULE | Freq: Every day | ORAL | 3 refills | Status: DC

## 2017-01-09 NOTE — Progress Notes (Signed)
cc'ed to pcp °

## 2017-01-09 NOTE — Patient Instructions (Signed)
I have sent in 90 day supply of Dexilant to your pharmacy with 3 refills. We will see you in 2 years unless something changes!  Please complete the stool sample. If negative, your next colonoscopy will be in 2021!

## 2017-01-09 NOTE — Assessment & Plan Note (Signed)
71 year old male with chronic GERD, doing quite well with Dexilant once daily. No alarm signs. No concerning lower or upper GI symptoms. Check ifobt now. Colonoscopy planned for 2021 unless clinical changes. Return in 2 years.

## 2017-01-09 NOTE — Progress Notes (Signed)
Referring Provider: [name], MD Primary Care Physician:  [name], MD Primary GI: Dr. Gala Romney   Chief Complaint  Patient presents with  . Gastroesophageal Reflux    f/u, doing ok    HPI:   [name] is a 71 y.o. 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 Failed Protonix, Aciphex, Prevacid in the past. Last colonoscopy 2011 by Dr. Arnoldo Morale normal with repeat screening in 2021. On Dexilant for GERD last year.   Takes stool softener as needed for constipation. Declining prescriptive agents. Dexilant once daily. No dysphagia, abdominal pain, N/V. Has a good appetite. Had knee surgery in Oct of last year, otherwise no significant changes to past history.   Past Medical History:  Diagnosis Date  . CHF (congestive heart failure) ([place])   . Coronary artery disease    s/p multiple caths 2012, stenting  . Diabetes mellitus   . GERD (gastroesophageal reflux disease)   . Hyperlipidemia   . Hypertension   . PVD (peripheral vascular disease) ([place])    left SFA PTA & stenting in 02/2005 (Dr. Adora Fridge)    Past Surgical History:  Procedure Laterality Date  . CARDIAC CATHETERIZATION  12/23/2004   normal L main, normal LAD, normal L Cfx, RCA with 20% hypodense lesion in first end of vessel (Dr. Adora Fridge)  . CARDIAC CATHETERIZATION  08/26/2007   no significant CAD by cath, EF 50% (Dr. Jackie Plum)  . CARDIAC CATHETERIZATION  08/28/2010   stent to OM1 with 2.0x9mm BMS (Dr. Adora Fridge)  . CARDIAC CATHETERIZATION  09/11/2010   patent stent (Dr. Roni Bread)  . CARDIAC CATHETERIZATION  02/10/2011   95% prox in-stent restenosis within OM stent - opened with cutting balloon (Dr. Corky Downs)  . CARDIAC CATHETERIZATION  07/17/2011   in-stent restenosis - re-stented with Promus 2.25x10mm DES (Dr. Roni Bread)  . COLONOSCOPY  12/2009   Dr. Hampton Abbot  .  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 FEMORAL ARTERY STENT  02/27/2005   L SFA stenting - Wholey down SFA across lesion - predilatation with 4x4 Powerflex, stenting with 7x4 Smart, post-dilatation with 6x4 powerflex (Dr. Adora Fridge)  . LEFT HEART CATHETERIZATION WITH CORONARY ANGIOGRAM N/A 07/17/2011   Procedure: LEFT HEART CATHETERIZATION WITH CORONARY ANGIOGRAM;  Surgeon: Leonie Man, MD;  Location: [facility] CATH LAB;  Service: Cardiovascular;  Laterality: N/A;  Right radial approach  . left knee arthroscopy  05/2016  . NM MYOCAR PERF WALL MOTION  09/08/2013   abnormal lexiscan - low to intermediate risk;   . TRANSTHORACIC ECHOCARDIOGRAM  09/08/2013   EF 50-55%, mild LVH, grade 1 diastolic dysfunction, mildly calcified AV annulus, calcified MV, LA mildly dilated,     Current Outpatient Prescriptions  Medication Sig Dispense Refill  . acetaminophen (TYLENOL) 500 MG tablet Take 1,000 mg by mouth every 6 (six) hours as needed for mild pain.    Marland Kitchen aspirin (ECOTRIN) 325 MG EC tablet Take 325 mg by mouth daily.    . clopidogrel (PLAVIX) 75 MG tablet TAKE ONE TABLET BY MOUTH ONCE DAILY. 30 tablet 9  . Coenzyme Q10 (CO Q 10) 100 MG CAPS Take 400 mg by mouth daily.     Marland Kitchen dexlansoprazole (DEXILANT) 60 MG capsule Take 1 capsule (60 mg total) by mouth daily before breakfast. 90  capsule 3  . fexofenadine (ALLEGRA) 180 MG tablet Take 180 mg by mouth daily.      Marland Kitchen glipiZIDE-metformin (METAGLIP) 5-500 MG per tablet Take 1 tablet by mouth every evening.    . isosorbide mononitrate (IMDUR) 30 MG 24 hr tablet TAKE 1 TABLET EVERY DAY. 30 tablet 6  . metoprolol succinate (TOPROL-XL) 25 MG 24 hr tablet TAKE 1/2 TABLET BY MOUTH DAILY. 15 tablet 11  . nitroGLYCERIN (NITROSTAT) 0.4 MG SL tablet Place 1 tablet (0.4 mg total) under the tongue every 5 (five) minutes as needed. For chest pain 25 tablet 11  .  oxyCODONE-acetaminophen (PERCOCET) 5-325 MG tablet Take 1 tablet by mouth every 12 (twelve) hours as needed for severe pain. 6 tablet 0  . ramipril (ALTACE) 5 MG capsule TAKE (1) CAPSULE BY MOUTH ONCE DAILY. 30 capsule 11  . ranolazine (RANEXA) 500 MG 12 hr tablet Take 1 tablet (500 mg total) by mouth 2 (two) times daily. (Patient taking differently: Take 500 mg by mouth daily. ) 180 tablet 3  . testosterone cypionate (DEPOTESTOSTERONE CYPIONATE) 200 MG/ML injection Inject 100 mg into the muscle once a week.      No current facility-administered medications for this visit.     Allergies as of 01/09/2017 - Review Complete 01/09/2017  Allergen Reaction Noted  . Propoxyphene n-acetaminophen    . Statins Other (See Comments) 07/29/2011  . Tape  07/29/2011  . Flomax [tamsulosin hcl] Rash 08/16/2014  . Levaquin [levofloxacin hemihydrate] Rash 07/29/2011  . Penicillins Rash     Family History  Problem Relation Age of Onset  . Arrhythmia Mother 89  . Colon cancer Neg Hx   . Liver disease Neg Hx   . Inflammatory bowel disease Neg Hx   . Colon polyps Neg Hx     Social History   Social History  . Marital status: Married    Spouse name: N/A  . Number of children: 3  . Years of education: N/A   Occupational History  . Retired Astronomer [employer]   Social History Main Topics  . Smoking status: Never Smoker  . Smokeless tobacco: Never Used     Comment: quit 10 yrs ago  . Alcohol use No  . Drug use: No  . Sexual activity: No   Other Topics Concern  . None   Social History Narrative  . None    Review of Systems: Negative unless mentioned in HPI   Physical Exam: BP 122/70   Pulse 67   Temp 97.4 F (36.3 C) (Oral)   Ht 6' (1.829 m)   Wt 216 lb (98 kg)   BMI 29.29 kg/m  General:   Alert and oriented. No distress noted. Pleasant and cooperative.  Head:  Normocephalic and atraumatic. Eyes:  Conjuctiva clear without scleral icterus. Mouth:  Oral mucosa pink and moist. Good  dentition. No lesions. Heart:  S1, S2 present without murmurs, rubs, or gallops. Regular rate and rhythm. Abdomen:  +BS, soft, non-tender and non-distended. No rebound or guarding. No HSM or masses noted. Msk:  Symmetrical without gross deformities. Normal posture. Extremities:  Without edema. Neurologic:  Alert and  oriented x4;  grossly normal neurologically. Psych:  Alert and cooperative. Normal mood and affect.

## 2017-01-12 ENCOUNTER — Ambulatory Visit (INDEPENDENT_AMBULATORY_CARE_PROVIDER_SITE_OTHER): Payer: PPO

## 2017-01-12 DIAGNOSIS — R195 Other fecal abnormalities: Secondary | ICD-10-CM | POA: Insufficient documentation

## 2017-01-12 DIAGNOSIS — K219 Gastro-esophageal reflux disease without esophagitis: Secondary | ICD-10-CM | POA: Diagnosis not present

## 2017-01-12 LAB — IFOBT (OCCULT BLOOD): IFOBT: POSITIVE

## 2017-01-12 NOTE — Assessment & Plan Note (Signed)
Patient completed an ifobt several days after the visit, and this was positive. At the time of visit, I discussed the need for an early interval colonoscopy if heme positive stool. His last colonoscopy was normal in 2011, performed by Dr. Arnoldo Morale. Again, he has no concerning lower or upper GI symptoms.   Proceed with TCS with Dr. Gala Romney in near future: the risks, benefits, and alternatives have been discussed with the patient in detail. The patient states understanding and desires to proceed. Hold metaglip the day of the procedure. AS OF NOTE: he is on aspirin and plavix.

## 2017-01-12 NOTE — Progress Notes (Signed)
Patient is heme positive. Please arrange colonoscopy with Dr. Gala Romney due to heme positive stool. I discussed this possibility with him at his appt on 6/1, so he is aware this was a possibility. Hold metaglip the day of the procedure.

## 2017-01-13 ENCOUNTER — Other Ambulatory Visit: Payer: Self-pay

## 2017-01-13 DIAGNOSIS — R195 Other fecal abnormalities: Secondary | ICD-10-CM

## 2017-01-13 MED ORDER — PEG 3350-KCL-NA BICARB-NACL 420 G PO SOLR: 4000.0000 mL | ORAL | 0 refills | Status: DC

## 2017-01-13 NOTE — Progress Notes (Signed)
No need to hold metaglip evening before.

## 2017-01-19 ENCOUNTER — Telehealth: Payer: Self-pay

## 2017-01-19 NOTE — Telephone Encounter (Signed)
Pt called office and he had received colonoscopy instructions and read to notify office if he was diabetic or on blood thinners. Informed him that AB was aware of Metaglip (instructions already noted). Told pt I would let AB know about Aspirin and Plavix, it is noted in his chart.  Routing to AB.

## 2017-01-19 NOTE — Telephone Encounter (Signed)
Tried to call pt, he wasn't at home. Informed his wife.

## 2017-01-19 NOTE — Telephone Encounter (Signed)
No changes needed for aspirin or plavix.

## 2017-01-30 ENCOUNTER — Encounter (HOSPITAL_COMMUNITY)
Admission: RE | Disposition: A | Discharge status: Home / Self Care | Payer: Self-pay | Source: Ambulatory Visit | Attending: Internal Medicine

## 2017-01-30 ENCOUNTER — Encounter (HOSPITAL_COMMUNITY): Payer: Self-pay

## 2017-01-30 ENCOUNTER — Ambulatory Visit (HOSPITAL_COMMUNITY)
Admission: RE | Admit: 2017-01-30 | Discharge: 2017-01-30 | Disposition: A | Discharge status: Home / Self Care | Payer: PPO | Source: Ambulatory Visit | Attending: Internal Medicine | Admitting: Internal Medicine

## 2017-01-30 DIAGNOSIS — Z7982 Long term (current) use of aspirin: Secondary | ICD-10-CM | POA: Insufficient documentation

## 2017-01-30 DIAGNOSIS — E785 Hyperlipidemia, unspecified: Secondary | ICD-10-CM | POA: Insufficient documentation

## 2017-01-30 DIAGNOSIS — E1151 Type 2 diabetes mellitus with diabetic peripheral angiopathy without gangrene: Secondary | ICD-10-CM | POA: Insufficient documentation

## 2017-01-30 DIAGNOSIS — I2510 Atherosclerotic heart disease of native coronary artery without angina pectoris: Secondary | ICD-10-CM | POA: Diagnosis not present

## 2017-01-30 DIAGNOSIS — Z79899 Other long term (current) drug therapy: Secondary | ICD-10-CM | POA: Diagnosis not present

## 2017-01-30 DIAGNOSIS — K5900 Constipation, unspecified: Secondary | ICD-10-CM | POA: Diagnosis not present

## 2017-01-30 DIAGNOSIS — K219 Gastro-esophageal reflux disease without esophagitis: Secondary | ICD-10-CM | POA: Diagnosis not present

## 2017-01-30 DIAGNOSIS — Z8371 Family history of colonic polyps: Secondary | ICD-10-CM | POA: Diagnosis not present

## 2017-01-30 DIAGNOSIS — I110 Hypertensive heart disease with heart failure: Secondary | ICD-10-CM | POA: Diagnosis not present

## 2017-01-30 DIAGNOSIS — R195 Other fecal abnormalities: Secondary | ICD-10-CM | POA: Diagnosis not present

## 2017-01-30 DIAGNOSIS — I509 Heart failure, unspecified: Secondary | ICD-10-CM | POA: Insufficient documentation

## 2017-01-30 DIAGNOSIS — K640 First degree hemorrhoids: Secondary | ICD-10-CM | POA: Insufficient documentation

## 2017-01-30 DIAGNOSIS — Z87891 Personal history of nicotine dependence: Secondary | ICD-10-CM | POA: Diagnosis not present

## 2017-01-30 DIAGNOSIS — K5730 Diverticulosis of large intestine without perforation or abscess without bleeding: Secondary | ICD-10-CM | POA: Diagnosis not present

## 2017-01-30 HISTORY — PX: COLONOSCOPY: SHX5424

## 2017-01-30 LAB — CBC WITH DIFFERENTIAL/PLATELET
Basophils Absolute: 0 10*3/uL (ref 0.0–0.1)
Basophils Relative: 0 %
Eosinophils Absolute: 0 10*3/uL (ref 0.0–0.7)
Eosinophils Relative: 1 %
HEMATOCRIT: 42.1 % (ref 39.0–52.0)
Hemoglobin: 14 g/dL (ref 13.0–17.0)
LYMPHS ABS: 1.6 10*3/uL (ref 0.7–4.0)
LYMPHS PCT: 32 %
MCH: 32.1 pg (ref 26.0–34.0)
MCHC: 33.3 g/dL (ref 30.0–36.0)
MCV: 96.6 fL (ref 78.0–100.0)
MONO ABS: 0.5 10*3/uL (ref 0.1–1.0)
MONOS PCT: 9 %
NEUTROS ABS: 2.9 10*3/uL (ref 1.7–7.7)
Neutrophils Relative %: 58 %
Platelets: 178 10*3/uL (ref 150–400)
RBC: 4.36 MIL/uL (ref 4.22–5.81)
RDW: 14.1 % (ref 11.5–15.5)
WBC: 5 10*3/uL (ref 4.0–10.5)

## 2017-01-30 LAB — GLUCOSE, CAPILLARY: GLUCOSE-CAPILLARY: 87 mg/dL (ref 65–99)

## 2017-01-30 SURGERY — COLONOSCOPY
Anesthesia: Moderate Sedation

## 2017-01-30 MED ORDER — MIDAZOLAM HCL 5 MG/5ML IJ SOLN
INTRAMUSCULAR | Status: AC
  Filled 2017-01-30: qty 10

## 2017-01-30 MED ORDER — MEPERIDINE HCL 100 MG/ML IJ SOLN
INTRAMUSCULAR | Status: DC
Start: 2017-01-30 — End: 2017-01-30
  Filled 2017-01-30: qty 2

## 2017-01-30 MED ORDER — SIMETHICONE 40 MG/0.6ML PO SUSP
ORAL | Status: DC | PRN
  Administered 2017-01-30: 08:00:00

## 2017-01-30 MED ORDER — ONDANSETRON HCL 4 MG/2ML IJ SOLN
INTRAMUSCULAR | Status: DC | PRN
  Administered 2017-01-30: 4 mg via INTRAVENOUS

## 2017-01-30 MED ORDER — ONDANSETRON HCL 4 MG/2ML IJ SOLN
INTRAMUSCULAR | Status: AC
  Filled 2017-01-30: qty 2

## 2017-01-30 MED ORDER — SODIUM CHLORIDE 0.9 % IV SOLN
INTRAVENOUS | Status: DC
  Administered 2017-01-30: 08:00:00 via INTRAVENOUS

## 2017-01-30 MED ORDER — MEPERIDINE HCL 100 MG/ML IJ SOLN
INTRAMUSCULAR | Status: DC | PRN
  Administered 2017-01-30: 50 mg via INTRAVENOUS

## 2017-01-30 MED ORDER — MIDAZOLAM HCL 5 MG/5ML IJ SOLN
INTRAMUSCULAR | Status: DC | PRN
  Administered 2017-01-30 (×2): 1 mg via INTRAVENOUS
  Administered 2017-01-30: 2 mg via INTRAVENOUS
  Administered 2017-01-30: 1 mg via INTRAVENOUS

## 2017-01-30 NOTE — Interval H&P Note (Signed)
History and Physical Interval Note:  01/30/2017 8:24 AM  [name]  has presented today for surgery, with the diagnosis of heme positive stool  The various methods of treatment have been discussed with the patient and family. After consideration of risks, benefits and other options for treatment, the patient has consented to  Procedure(s) with comments: COLONOSCOPY (N/A) - 8:15am as a surgical intervention .  The patient's history has been reviewed, patient examined, no change in status, stable for surgery.  I have reviewed the patient's chart and labs.  Questions were answered to the patient's satisfaction.     No change. Diagnostic colonoscopy for heme-positive stool per plan.  The risks, benefits, limitations, alternatives and imponderables have been reviewed with the patient. Questions have been answered. All parties are agreeable.    Manus Rudd

## 2017-01-30 NOTE — Discharge Instructions (Addendum)
Colon diverticulosis information provided  CBC today  I do not recommend a future colonoscopy unless new symptoms develop Colonoscopy Discharge Instructions  Read the instructions outlined below and refer to this sheet in the next few weeks. These discharge instructions provide you with general information on caring for yourself after you leave the hospital. Your doctor may also give you specific instructions. While your treatment has been planned according to the most current medical practices available, unavoidable complications occasionally occur. If you have any problems or questions after discharge, call Dr. Gala Romney at [phone]. ACTIVITY  You may resume your regular activity, but move at a slower pace for the next 24 hours.   Take frequent rest periods for the next 24 hours.   Walking will help get rid of the air and reduce the bloated feeling in your belly (abdomen).   No driving for 24 hours (because of the medicine (anesthesia) used during the test).    Do not sign any important legal documents or operate any machinery for 24 hours (because of the anesthesia used during the test).  NUTRITION  Drink plenty of fluids.   You may resume your normal diet as instructed by your doctor.   Begin with a light meal and progress to your normal diet. Heavy or fried foods are harder to digest and may make you feel sick to your stomach (nauseated).   Avoid alcoholic beverages for 24 hours or as instructed.  MEDICATIONS  You may resume your normal medications unless your doctor tells you otherwise.  WHAT YOU CAN EXPECT TODAY  Some feelings of bloating in the abdomen.   Passage of more gas than usual.   Spotting of blood in your stool or on the toilet paper.  IF YOU HAD POLYPS REMOVED DURING THE COLONOSCOPY:  No aspirin products for 7 days or as instructed.   No alcohol for 7 days or as instructed.   Eat a soft diet for the next 24 hours.  FINDING OUT THE RESULTS OF YOUR TEST Not  all test results are available during your visit. If your test results are not back during the visit, make an appointment with your caregiver to find out the results. Do not assume everything is normal if you have not heard from your caregiver or the medical facility. It is important for you to follow up on all of your test results.  SEEK IMMEDIATE MEDICAL ATTENTION IF:  You have more than a spotting of blood in your stool.   Your belly is swollen (abdominal distention).   You are nauseated or vomiting.   You have a temperature over 101.   You have abdominal pain or discomfort that is severe or gets worse throughout the day.    Diverticulosis Diverticulosis is a condition that develops when small pouches (diverticula) form in the wall of the large intestine (colon). The colon is where water is absorbed and stool is formed. The pouches form when the inside layer of the colon pushes through weak spots in the outer layers of the colon. You may have a few pouches or many of them. What are the causes? The cause of this condition is not known. What increases the risk? The following factors may make you more likely to develop this condition:  Being older than age 57. Your risk for this condition increases with age. Diverticulosis is rare among people younger than age 65. By age 29, many people have it.  Eating a low-fiber diet.  Having frequent constipation.  Being  overweight.  Not getting enough exercise.  Smoking.  Taking over-the-counter pain medicines, like aspirin and ibuprofen.  Having a family history of diverticulosis.  What are the signs or symptoms? In most people, there are no symptoms of this condition. If you do have symptoms, they may include:  Bloating.  Cramps in the abdomen.  Constipation or diarrhea.  Pain in the lower left side of the abdomen.  How is this diagnosed? This condition is most often diagnosed during an exam for other colon problems. Because  diverticulosis usually has no symptoms, it often cannot be diagnosed independently. This condition may be diagnosed by:  Using a flexible scope to examine the colon (colonoscopy).  Taking an X-ray of the colon after dye has been put into the colon (barium enema).  Doing a CT scan.  How is this treated? You may not need treatment for this condition if you have never developed an infection related to diverticulosis. If you have had an infection before, treatment may include:  Eating a high-fiber diet. This may include eating more fruits, vegetables, and grains.  Taking a fiber supplement.  Taking a live bacteria supplement (probiotic).  Taking medicine to relax your colon.  Taking antibiotic medicines.  Follow these instructions at home:  Drink 6-8 glasses of water or more each day to prevent constipation.  Try not to strain when you have a bowel movement.  If you have had an infection before: ? Eat more fiber as directed by your health care provider or your diet and nutrition specialist (dietitian). ? Take a fiber supplement or probiotic, if your health care provider approves.  Take over-the-counter and prescription medicines only as told by your health care provider.  If you were prescribed an antibiotic, take it as told by your health care provider. Do not stop taking the antibiotic even if you start to feel better.  Keep all follow-up visits as told by your health care provider. This is important. Contact a health care provider if:  You have pain in your abdomen.  You have bloating.  You have cramps.  You have not had a bowel movement in 3 days. Get help right away if:  Your pain gets worse.  Your bloating becomes very bad.  You have a fever or chills, and your symptoms suddenly get worse.  You vomit.  You have bowel movements that are bloody or black.  You have bleeding from your rectum. Summary  Diverticulosis is a condition that develops when small  pouches (diverticula) form in the wall of the large intestine (colon).  You may have a few pouches or many of them.  This condition is most often diagnosed during an exam for other colon problems.  If you have had an infection related to diverticulosis, treatment may include increasing the fiber in your diet, taking supplements, or taking medicines. This information is not intended to replace advice given to you by your health care provider. Make sure you discuss any questions you have with your health care provider. Document Released: 04/24/2004 Document Revised: 06/16/2016 Document Reviewed: 06/16/2016 Elsevier Interactive Patient Education  2017 [employer].

## 2017-01-30 NOTE — H&P (View-Only) (Signed)
Referring Provider: [name], MD Primary Care Physician:  [name], MD Primary GI: Dr. Gala Romney   Chief Complaint  Patient presents with  . Gastroesophageal Reflux    f/u, doing ok    HPI:   [name] is a 71 y.o. 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 Failed Protonix, Aciphex, Prevacid in the past. Last colonoscopy 2011 by Dr. Arnoldo Morale normal with repeat screening in 2021. On Dexilant for GERD last year.   Takes stool softener as needed for constipation. Declining prescriptive agents. Dexilant once daily. No dysphagia, abdominal pain, N/V. Has a good appetite. Had knee surgery in Oct of last year, otherwise no significant changes to past history.   Past Medical History:  Diagnosis Date  . CHF (congestive heart failure) ([place])   . Coronary artery disease    s/p multiple caths 2012, stenting  . Diabetes mellitus   . GERD (gastroesophageal reflux disease)   . Hyperlipidemia   . Hypertension   . PVD (peripheral vascular disease) ([place])    left SFA PTA & stenting in 02/2005 (Dr. Adora Fridge)    Past Surgical History:  Procedure Laterality Date  . CARDIAC CATHETERIZATION  12/23/2004   normal L main, normal LAD, normal L Cfx, RCA with 20% hypodense lesion in first end of vessel (Dr. Adora Fridge)  . CARDIAC CATHETERIZATION  08/26/2007   no significant CAD by cath, EF 50% (Dr. Jackie Plum)  . CARDIAC CATHETERIZATION  08/28/2010   stent to OM1 with 2.0x23mm BMS (Dr. Adora Fridge)  . CARDIAC CATHETERIZATION  09/11/2010   patent stent (Dr. Roni Bread)  . CARDIAC CATHETERIZATION  02/10/2011   95% prox in-stent restenosis within OM stent - opened with cutting balloon (Dr. Corky Downs)  . CARDIAC CATHETERIZATION  07/17/2011   in-stent restenosis - re-stented with Promus 2.25x65mm DES (Dr. Roni Bread)  . COLONOSCOPY  12/2009   Dr. Hampton Abbot  .  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 FEMORAL ARTERY STENT  02/27/2005   L SFA stenting - [place] down SFA across lesion - predilatation with 4x4 Powerflex, stenting with 7x4 Smart, post-dilatation with 6x4 powerflex (Dr. Adora Fridge)  . LEFT HEART CATHETERIZATION WITH CORONARY ANGIOGRAM N/A 07/17/2011   Procedure: LEFT HEART CATHETERIZATION WITH CORONARY ANGIOGRAM;  Surgeon: Leonie Man, MD;  Location: [facility] CATH LAB;  Service: Cardiovascular;  Laterality: N/A;  Right radial approach  . left knee arthroscopy  05/2016  . NM MYOCAR PERF WALL MOTION  09/08/2013   abnormal lexiscan - low to intermediate risk;   . TRANSTHORACIC ECHOCARDIOGRAM  09/08/2013   EF 50-55%, mild LVH, grade 1 diastolic dysfunction, mildly calcified AV annulus, calcified MV, LA mildly dilated,     Current Outpatient Prescriptions  Medication Sig Dispense Refill  . acetaminophen (TYLENOL) 500 MG tablet Take 1,000 mg by mouth every 6 (six) hours as needed for mild pain.    Marland Kitchen aspirin (ECOTRIN) 325 MG EC tablet Take 325 mg by mouth daily.    . clopidogrel (PLAVIX) 75 MG tablet TAKE ONE TABLET BY MOUTH ONCE DAILY. 30 tablet 9  . Coenzyme Q10 (CO Q 10) 100 MG CAPS Take 400 mg by mouth daily.     Marland Kitchen dexlansoprazole (DEXILANT) 60 MG capsule Take 1 capsule (60 mg total) by mouth daily before breakfast. 90  capsule 3  . fexofenadine (ALLEGRA) 180 MG tablet Take 180 mg by mouth daily.      Marland Kitchen glipiZIDE-metformin (METAGLIP) 5-500 MG per tablet Take 1 tablet by mouth every evening.    . isosorbide mononitrate (IMDUR) 30 MG 24 hr tablet TAKE 1 TABLET EVERY DAY. 30 tablet 6  . metoprolol succinate (TOPROL-XL) 25 MG 24 hr tablet TAKE 1/2 TABLET BY MOUTH DAILY. 15 tablet 11  . nitroGLYCERIN (NITROSTAT) 0.4 MG SL tablet Place 1 tablet (0.4 mg total) under the tongue every 5 (five) minutes as needed. For chest pain 25 tablet 11  .  oxyCODONE-acetaminophen (PERCOCET) 5-325 MG tablet Take 1 tablet by mouth every 12 (twelve) hours as needed for severe pain. 6 tablet 0  . ramipril (ALTACE) 5 MG capsule TAKE (1) CAPSULE BY MOUTH ONCE DAILY. 30 capsule 11  . ranolazine (RANEXA) 500 MG 12 hr tablet Take 1 tablet (500 mg total) by mouth 2 (two) times daily. (Patient taking differently: Take 500 mg by mouth daily. ) 180 tablet 3  . testosterone cypionate (DEPOTESTOSTERONE CYPIONATE) 200 MG/ML injection Inject 100 mg into the muscle once a week.      No current facility-administered medications for this visit.     Allergies as of 01/09/2017 - Review Complete 01/09/2017  Allergen Reaction Noted  . Propoxyphene n-acetaminophen    . Statins Other (See Comments) 07/29/2011  . Tape  07/29/2011  . Flomax [tamsulosin hcl] Rash 08/16/2014  . Levaquin [levofloxacin hemihydrate] Rash 07/29/2011  . Penicillins Rash     Family History  Problem Relation Age of Onset  . Arrhythmia Mother 24  . Colon cancer Neg Hx   . Liver disease Neg Hx   . Inflammatory bowel disease Neg Hx   . Colon polyps Neg Hx     Social History   Social History  . Marital status: Married    Spouse name: N/A  . Number of children: 3  . Years of education: N/A   Occupational History  . Retired Astronomer [employer]   Social History Main Topics  . Smoking status: Never Smoker  . Smokeless tobacco: Never Used     Comment: quit 10 yrs ago  . Alcohol use No  . Drug use: No  . Sexual activity: No   Other Topics Concern  . None   Social History Narrative  . None    Review of Systems: Negative unless mentioned in HPI   Physical Exam: BP 122/70   Pulse 67   Temp 97.4 F (36.3 C) (Oral)   Ht 6' (1.829 m)   Wt 216 lb (98 kg)   BMI 29.29 kg/m  General:   Alert and oriented. No distress noted. Pleasant and cooperative.  Head:  Normocephalic and atraumatic. Eyes:  Conjuctiva clear without scleral icterus. Mouth:  Oral mucosa pink and moist. Good  dentition. No lesions. Heart:  S1, S2 present without murmurs, rubs, or gallops. Regular rate and rhythm. Abdomen:  +BS, soft, non-tender and non-distended. No rebound or guarding. No HSM or masses noted. Msk:  Symmetrical without gross deformities. Normal posture. Extremities:  Without edema. Neurologic:  Alert and  oriented x4;  grossly normal neurologically. Psych:  Alert and cooperative. Normal mood and affect.

## 2017-01-30 NOTE — Op Note (Signed)
[facility] Patient Name: [name] Procedure Date: 01/30/2017 8:05 AM MRN: [id number] Date of Birth: September 11, 1945 Attending MD: Norvel Richards , MD CSN: [id number] Age: 71 Admit Type: Outpatient Procedure:                Colonoscopy Indications:              Heme positive stool Providers:                Norvel Richards, MD, Otis Peak B. Gwenlyn Perking RN, RN,                            Aram Candela Referring MD:              Medicines:                Midazolam 5 mg IV, Meperidine 50 mg IV, Ondansetron                            4 mg IV Complications:            No immediate complications. Estimated Blood Loss:     Estimated blood loss: none. Procedure:                Pre-Anesthesia Assessment:                           - Prior to the procedure, a History and Physical                            was performed, and patient medications and                            allergies were reviewed. The patient's tolerance of                            previous anesthesia was also reviewed. The risks                            and benefits of the procedure and the sedation                            options and risks were discussed with the patient.                            All questions were answered, and informed consent                            was obtained. Prior Anticoagulants: The patient has                            taken no previous anticoagulant or antiplatelet                            agents. ASA Grade Assessment: III - A patient with  severe systemic disease. After reviewing the risks                            and benefits, the patient was deemed in                            satisfactory condition to undergo the procedure.                           After obtaining informed consent, the colonoscope                            was passed under direct vision. Throughout the                            procedure, the patient's blood pressure, pulse,  and                            oxygen saturations were monitored continuously. The                            Colonoscope was introduced through the anus and                            advanced to the the cecum, identified by                            appendiceal orifice and ileocecal valve. The                            ileocecal valve, appendiceal orifice, and rectum                            were photographed. The entire colon was well                            visualized. The colonoscopy was performed without                            difficulty. The patient tolerated the procedure                            well. The quality of the bowel preparation was                            adequate. Scope In: 8:35:58 AM Scope Out: 8:48:09 AM Scope Withdrawal Time: 0 hours 7 minutes 17 seconds  Total Procedure Duration: 0 hours 12 minutes 11 seconds  Findings:      The perianal and digital rectal examinations were normal.      A few small and large-mouthed diverticula were found in the entire colon.      Non-bleeding internal hemorrhoids were found during retroflexion. The       hemorrhoids were mild, small and Grade I (internal hemorrhoids that do  not prolapse).      The exam was otherwise without abnormality on direct and retroflexion       views. Impression:               - Diverticulosis in the entire examined colon.                           - Non-bleeding internal hemorrhoids.                           - The examination was otherwise normal on direct                            and retroflexion views.                           - No specimens collected. Moderate Sedation:      Moderate (conscious) sedation was administered by the endoscopy nurse       and supervised by the endoscopist. The following parameters were       monitored: oxygen saturation, heart rate, blood pressure, respiratory       rate, EKG, adequacy of pulmonary ventilation, and response to care.       Total  physician intraservice time was 22 minutes. Recommendation:           - Patient has a contact number available for                            emergencies. The signs and symptoms of potential                            delayed complications were discussed with the                            patient. Return to normal activities tomorrow.                            Written discharge instructions were provided to the                            patient.                           - Resume previous diet.                           - No repeat colonoscopy due to age.                           - Return to GI clinic (date not yet determined).                            CBC today. No further GI evaluation so long as his                            H&H is okay and he does not display symptoms or  signs of GI bleeding otherwise.                           - Continue present medications. Procedure Code(s):        --- Professional ---                           [phone], Colonoscopy, flexible; diagnostic, including                            collection of specimen(s) by brushing or washing,                            when performed (separate procedure)                           [id number], Moderate sedation services provided by the                            same physician or other qualified health care                            professional performing the diagnostic or                            therapeutic service that the sedation supports,                            requiring the presence of an independent trained                            observer to assist in the monitoring of the                            patient's level of consciousness and physiological                            status; initial 15 minutes of intraservice time,                            patient age 33 years or older Diagnosis Code(s):        --- Professional ---                           K64.0, First degree  hemorrhoids                           R19.5, Other fecal abnormalities                           K57.30, Diverticulosis of large intestine without                            perforation or abscess without bleeding CPT copyright 2016 American Medical Association. All rights reserved. The codes documented in this report are preliminary and upon  coder review may  be revised to meet current compliance requirements. Cristopher Estimable. Rourk, MD Norvel Richards, MD 01/30/2017 8:54:52 AM This report has been signed electronically. Number of Addenda: 0

## 2017-02-03 ENCOUNTER — Encounter (HOSPITAL_COMMUNITY): Payer: Self-pay | Admitting: Internal Medicine

## 2017-02-04 ENCOUNTER — Ambulatory Visit (HOSPITAL_COMMUNITY)
Admission: RE | Admit: 2017-02-04 | Discharge: 2017-02-04 | Disposition: A | Discharge status: Home / Self Care | Payer: PPO | Source: Ambulatory Visit | Attending: Internal Medicine | Admitting: Internal Medicine

## 2017-02-04 ENCOUNTER — Other Ambulatory Visit (HOSPITAL_COMMUNITY): Payer: Self-pay | Admitting: Internal Medicine

## 2017-02-04 DIAGNOSIS — M25551 Pain in right hip: Secondary | ICD-10-CM | POA: Diagnosis not present

## 2017-02-04 DIAGNOSIS — E1169 Type 2 diabetes mellitus with other specified complication: Secondary | ICD-10-CM | POA: Diagnosis not present

## 2017-02-04 DIAGNOSIS — E119 Type 2 diabetes mellitus without complications: Secondary | ICD-10-CM | POA: Diagnosis not present

## 2017-02-04 DIAGNOSIS — I10 Essential (primary) hypertension: Secondary | ICD-10-CM | POA: Diagnosis not present

## 2017-02-04 DIAGNOSIS — E785 Hyperlipidemia, unspecified: Secondary | ICD-10-CM | POA: Diagnosis not present

## 2017-02-04 DIAGNOSIS — E559 Vitamin D deficiency, unspecified: Secondary | ICD-10-CM | POA: Diagnosis not present

## 2017-02-10 ENCOUNTER — Ambulatory Visit (INDEPENDENT_AMBULATORY_CARE_PROVIDER_SITE_OTHER): Payer: PPO | Admitting: Urology

## 2017-02-10 DIAGNOSIS — R351 Nocturia: Secondary | ICD-10-CM

## 2017-02-10 DIAGNOSIS — N401 Enlarged prostate with lower urinary tract symptoms: Secondary | ICD-10-CM

## 2017-05-07 DIAGNOSIS — E785 Hyperlipidemia, unspecified: Secondary | ICD-10-CM | POA: Diagnosis not present

## 2017-05-07 DIAGNOSIS — M791 Myalgia: Secondary | ICD-10-CM | POA: Diagnosis not present

## 2017-05-07 DIAGNOSIS — E1169 Type 2 diabetes mellitus with other specified complication: Secondary | ICD-10-CM | POA: Diagnosis not present

## 2017-05-07 DIAGNOSIS — E119 Type 2 diabetes mellitus without complications: Secondary | ICD-10-CM | POA: Diagnosis not present

## 2017-05-07 DIAGNOSIS — E291 Testicular hypofunction: Secondary | ICD-10-CM | POA: Diagnosis not present

## 2017-05-07 DIAGNOSIS — I10 Essential (primary) hypertension: Secondary | ICD-10-CM | POA: Diagnosis not present

## 2017-06-22 DIAGNOSIS — M542 Cervicalgia: Secondary | ICD-10-CM | POA: Diagnosis not present

## 2017-06-22 DIAGNOSIS — Z23 Encounter for immunization: Secondary | ICD-10-CM | POA: Diagnosis not present

## 2017-06-22 DIAGNOSIS — J019 Acute sinusitis, unspecified: Secondary | ICD-10-CM | POA: Diagnosis not present

## 2017-06-23 ENCOUNTER — Telehealth: Payer: Self-pay | Admitting: Cardiovascular Disease

## 2017-06-23 NOTE — Telephone Encounter (Signed)
Returned call to pt he states that for the last 7 days "or so" he has been having chest pain at rest while watching TV, it only lasts a few seconds then resolves. It will happen about 2-3 times, but is too short to take his nitro.also pt usually walks 2-3 miles a day, but for the last 2-3 days he has been making it about 1.35 miles then has some chest tightness and pressure, denies chest pain, or any other sx. States that "sometimes he is SOB at that time but not all the time. He will stop and rest and this will resolve in a few seconds. Pt is unable to come this afternoon for appt, he states that he has appt 11-27 and he will wait for this appt. Pt verbalizes understanding to go to ER for increased or worsening of sx.

## 2017-06-23 NOTE — Telephone Encounter (Signed)
New Message  Pt c/o of Chest Pain: STAT if CP now or developed within 24 hours  1. Are you having CP right now? no  2. Are you experiencing any other symptoms (ex. SOB, nausea, vomiting, sweating)? Per pt when walking he gets chest tightness   3. How long have you been experiencing CP? Per pt about a week  4. Is your CP continuous or coming and going? Coming and going   5. Have you taken Nitroglycerin? No  Pt has an appt on 11/27. Please call back to discuss if needed ?

## 2017-07-07 ENCOUNTER — Encounter: Payer: Self-pay | Admitting: Cardiovascular Disease

## 2017-07-07 ENCOUNTER — Ambulatory Visit: Payer: PPO | Admitting: Cardiovascular Disease

## 2017-07-07 VITALS — BP 130/74 | HR 69 | Ht 72.0 in | Wt 219.0 lb

## 2017-07-07 DIAGNOSIS — E785 Hyperlipidemia, unspecified: Secondary | ICD-10-CM

## 2017-07-07 DIAGNOSIS — I739 Peripheral vascular disease, unspecified: Secondary | ICD-10-CM | POA: Diagnosis not present

## 2017-07-07 DIAGNOSIS — Z955 Presence of coronary angioplasty implant and graft: Secondary | ICD-10-CM | POA: Diagnosis not present

## 2017-07-07 DIAGNOSIS — R079 Chest pain, unspecified: Secondary | ICD-10-CM

## 2017-07-07 DIAGNOSIS — I10 Essential (primary) hypertension: Secondary | ICD-10-CM | POA: Diagnosis not present

## 2017-07-07 MED ORDER — NITROGLYCERIN 0.4 MG SL SUBL: 0.4000 mg | SUBLINGUAL_TABLET | SUBLINGUAL | 11 refills | Status: DC | PRN

## 2017-07-07 MED ORDER — ISOSORBIDE MONONITRATE ER 60 MG PO TB24: 60.0000 mg | ORAL_TABLET | Freq: Every day | ORAL | 6 refills | Status: DC

## 2017-07-07 NOTE — Assessment & Plan Note (Signed)
History of essential hypertension with blood pressure today 130/74. He is on ramipril and metoprolol. Continue current meds at current dosing

## 2017-07-07 NOTE — Assessment & Plan Note (Signed)
History of CAD status post stenting of the first obtuse marginal branch by myself 08/28/10. He's had several recurrent catheterizations by Dr. Ellyn Hack and Claiborne Billings for "in-stent restenosis. He was begun on Ranexa when I saw him a year ago he had been pain-free. Starting approximately 6 weeks ago he developed exertional chest heaviness. I'm going to increase his Imdur from 30-60 mg a day and will obtain an exercise Myoview stress test to further evaluate.

## 2017-07-07 NOTE — Assessment & Plan Note (Signed)
History of dyslipidemia intolerant to statin therapy followed by his PCP.

## 2017-07-07 NOTE — Progress Notes (Signed)
07/07/2017 [name]   [birth year]  [id number]  Primary Physician Ricky Albee, MD Primary Cardiologist: Ricky Harp MD FACP, [place], [place], [place]  HPI:  [name] is a 71 y.o.  mildly overweight married African American male father of 3 who I last saw in the office 09/03/16. He has a history of PVOD status post left SFA, PTA and stenting by myself back in July of 2006 with subsequent improvement in his claudication and Dopplers. These were last done in April of last year and showed ABIs of greater than 1 bilaterally. His other problems include hypertension, hyperlipidemia, non-insulin-requiring diabetes and statin intolerance. I catheterized him August 28, 2010 and stented his first OM branch with a bare metal stent. He was readmitted September 09, 2010 with recurrent chest pain and was re-cathed by Dr. Ellyn Lucas via the right radial approach revealing a widely patent stent. He was admitted again June 29th through July 3rd with chest pain and ruled out for myocardial infarction. His stress test showed subtle lateral ischemia and cath performed by Dr. Claiborne Lucas February 10, 2012 revealed 95% proximal in-stent restenosis within the OM stent which Dr. Claiborne Lucas opened up with a cutting balloon. He had done well until December of last year when he was admitted again with chest pain. Dr. Ellyn Lucas recathed him revealing again in-stent restenosis which was "restented" with a Promus drug-eluting stent. He was having chest pain when I saw him back in December, however, since that time he has had minimal chest pain and he does walk 3-5 miles a day. His most recent liver profile performed 08/22/15 revealed an LDL of 94 and HDL 47 on Zocor 20 mg a day. He was recently admitted with chest pain/rule out MI 08/30/13. This is either negative. 2-D echo was essentially unremarkable with mild inferoseptal hypokinesia and a Myoview stress test was read as low risk. His medications were adjusted. Ranexa was added..He has had  no recurrent chest pain since I saw him a year ago until recently when he was taking his Ranexa once a day instead of twice a day. Since I saw him a year ago he denies claudication but does complain of new onset chest pain several weeks ago which is exertional.     Current Meds  Medication Sig  . acetaminophen (TYLENOL) 500 MG tablet Take 1,000 mg by mouth every 6 (six) hours as needed for mild pain.  Marland Kitchen aspirin (ECOTRIN) 325 MG EC tablet Take 325 mg by mouth daily.  . clopidogrel (PLAVIX) 75 MG tablet TAKE ONE TABLET BY MOUTH ONCE DAILY.  Marland Kitchen Coenzyme Q10 (CO Q-10) 400 MG CAPS Take 400 mg by mouth daily.  . cyclobenzaprine (FLEXERIL) 10 MG tablet Take 10 mg by mouth 3 (three) times daily as needed for muscle spasms.  Marland Kitchen dexlansoprazole (DEXILANT) 60 MG capsule Take 1 capsule (60 mg total) by mouth daily before breakfast.  . fexofenadine (ALLEGRA) 180 MG tablet Take 180 mg by mouth daily.    Marland Kitchen glipiZIDE-metformin (METAGLIP) 5-500 MG per tablet Take 1 tablet by mouth every evening.  Marland Kitchen ibuprofen (ADVIL,MOTRIN) 200 MG tablet Take 400 mg by mouth every 6 (six) hours as needed for moderate pain.  . isosorbide mononitrate (IMDUR) 60 MG 24 hr tablet Take 1 tablet (60 mg total) by mouth daily.  . metoprolol succinate (TOPROL-XL) 25 MG 24 hr tablet TAKE 1/2 TABLET BY MOUTH DAILY.  . nitroGLYCERIN (NITROSTAT) 0.4 MG SL tablet Place 1 tablet (0.4 mg total) under the  tongue every 5 (five) minutes as needed. For chest pain  . ramipril (ALTACE) 5 MG capsule TAKE (1) CAPSULE BY MOUTH ONCE DAILY.  . ranolazine (RANEXA) 500 MG 12 hr tablet Take 1 tablet (500 mg total) by mouth 2 (two) times daily. (Patient taking differently: Take 500 mg by mouth daily. )  . testosterone cypionate (DEPOTESTOSTERONE CYPIONATE) 200 MG/ML injection Inject 100 mg into the muscle once a week.   . [DISCONTINUED] isosorbide mononitrate (IMDUR) 30 MG 24 hr tablet TAKE 1 TABLET EVERY DAY.  . [DISCONTINUED] nitroGLYCERIN (NITROSTAT) 0.4 MG  SL tablet Place 1 tablet (0.4 mg total) under the tongue every 5 (five) minutes as needed. For chest pain     Allergies  Allergen Reactions  . Propoxyphene N-Acetaminophen Nausea Only  . Statins Other (See Comments)    Severe muscle cramping/aching/pain  . Tape     Blisters  . Flomax [Tamsulosin Hcl] Rash  . Levaquin [Levofloxacin Hemihydrate] Rash  . Penicillins Rash    Broke out in rash 6 years ago, pt recently took penicillin (09/2016) and had no reaction Has patient had a PCN reaction causing immediate rash, facial/tongue/throat swelling, SOB or lightheadedness with hypotension: Yes Has patient had a PCN reaction causing severe rash involving mucus membranes or skin necrosis: Unknown Has patient had a PCN reaction that required hospitalization: No Has patient had a PCN reaction occurring within the last 10 years: Yes If all of the above answers are "NO", then m    Social History   Socioeconomic History  . Marital status: Married    Spouse name: Not on file  . Number of children: 3  . Years of education: Not on file  . Highest education level: Not on file  Social Needs  . Financial resource strain: Not on file  . Food insecurity - worry: Not on file  . Food insecurity - inability: Not on file  . Transportation needs - medical: Not on file  . Transportation needs - non-medical: Not on file  Occupational History  . Occupation: Retired    Fish farm manager: [employer]  Tobacco Use  . Smoking status: Never Smoker  . Smokeless tobacco: Never Used  . Tobacco comment: quit 10 yrs ago  Substance and Sexual Activity  . Alcohol use: No  . Drug use: No  . Sexual activity: No  Other Topics Concern  . Not on file  Social History Narrative  . Not on file     Review of Systems: General: negative for chills, fever, night sweats or weight changes.  Cardiovascular: negative for chest pain, dyspnea on exertion, edema, orthopnea, palpitations, paroxysmal nocturnal dyspnea or  shortness of breath Dermatological: negative for rash Respiratory: negative for cough or wheezing Urologic: negative for hematuria Abdominal: negative for nausea, vomiting, diarrhea, bright red blood per rectum, melena, or hematemesis Neurologic: negative for visual changes, syncope, or dizziness All other systems reviewed and are otherwise negative except as noted above.    Blood pressure 130/74, pulse 69, height 6' (1.829 m), weight 219 lb (99.3 kg).  General appearance: alert and no distress Neck: no adenopathy, no carotid bruit, no JVD, supple, symmetrical, trachea midline and thyroid not enlarged, symmetric, no tenderness/mass/nodules Lungs: clear to auscultation bilaterally Heart: regular rate and rhythm, S1, S2 normal, no murmur, click, rub or gallop Extremities: extremities normal, atraumatic, no cyanosis or edema Pulses: 2+ and symmetric Skin: Skin color, texture, turgor normal. No rashes or lesions Neurologic: Alert and oriented X 3, normal strength and tone. Normal symmetric reflexes.  Normal coordination and gait  EKG normal sinus rhythm at 69without ST or T-wave changes. I personally reviewed this EKG.  ASSESSMENT AND PLAN:   Peripheral vascular disease History of peripheral arterial disease status post left SFA PTA and stenting by myself July 2006 with improvement in his claudication symptoms and Doppler studies. He currently denies claudication. His most recent Dopplers performed 09/03/16 revealed a right ABI of 1 and a left ABI 0.93 with a patent left SFA stent.  CAD ISR OM1 Rx'd with DES this admission History of CAD status post stenting of the first obtuse marginal branch by myself 08/28/10. He's had several recurrent catheterizations by Dr. Ellyn Lucas and [name] for "in-stent restenosis. He was begun on Ranexa when I saw him a year ago he had been pain-free. Starting approximately 6 weeks ago he developed exertional chest heaviness. I'm going to increase his Imdur from 30-60  mg a day and will obtain an exercise Myoview stress test to further evaluate.  Dyslipidemia History of dyslipidemia intolerant to statin therapy followed by his PCP.  HTN (hypertension) History of essential hypertension with blood pressure today 130/74. He is on ramipril and metoprolol. Continue current meds at current dosing      Ricky Harp MD [facility], [facility] 07/07/2017 4:41 PM

## 2017-07-07 NOTE — Assessment & Plan Note (Signed)
History of peripheral arterial disease status post left SFA PTA and stenting by myself July 2006 with improvement in his claudication symptoms and Doppler studies. He currently denies claudication. His most recent Dopplers performed 09/03/16 revealed a right ABI of 1 and a left ABI 0.93 with a patent left SFA stent.

## 2017-07-07 NOTE — Patient Instructions (Signed)
Medication Instructions: Your physician recommends that you continue on your current medications as directed. Please refer to the Current Medication list given to you today.  Increase Isosorbide to 60 mg daily.   Testing/Procedures: Your physician has requested that you have an exercise stress myoview THIS WEEK. For further information please visit [email or web address]. Please follow instruction sheet, as given.  Follow-Up: Your physician recommends that you schedule a follow-up appointment in: 1-2 weeks with Dr. Gwenlyn Found.  If you need a refill on your cardiac medications before your next appointment, please call your pharmacy.

## 2017-07-08 ENCOUNTER — Telehealth (HOSPITAL_COMMUNITY): Payer: Self-pay

## 2017-07-08 NOTE — Telephone Encounter (Signed)
Encounter complete. 

## 2017-07-09 ENCOUNTER — Ambulatory Visit (HOSPITAL_COMMUNITY)
Admission: RE | Admit: 2017-07-09 | Discharge: 2017-07-09 | Disposition: A | Discharge status: Home / Self Care | Payer: PPO | Source: Ambulatory Visit | Attending: Cardiovascular Disease | Admitting: Cardiovascular Disease

## 2017-07-09 DIAGNOSIS — R079 Chest pain, unspecified: Secondary | ICD-10-CM | POA: Diagnosis not present

## 2017-07-09 LAB — MYOCARDIAL PERFUSION IMAGING
CHL CUP NUCLEAR SRS: 1
CHL CUP RESTING HR STRESS: 63 {beats}/min
CHL RATE OF PERCEIVED EXERTION: 18
CSEPEDS: 36 s
CSEPHR: 96 %
Estimated workload: 10.1 METS
Exercise duration (min): 8 min
LV sys vol: 124 mL
LVDIAVOL: 55 mL (ref 62–150)
MPHR: 151 {beats}/min
Peak HR: 146 {beats}/min
SDS: 0
SSS: 1
TID: 0.98

## 2017-07-09 MED ORDER — TECHNETIUM TC 99M TETROFOSMIN IV KIT
32.0000 | PACK | Freq: Once | INTRAVENOUS | Status: AC | PRN
  Administered 2017-07-09: 32 via INTRAVENOUS
  Filled 2017-07-09: qty 32

## 2017-07-09 MED ORDER — TECHNETIUM TC 99M TETROFOSMIN IV KIT
10.4000 | PACK | Freq: Once | INTRAVENOUS | Status: AC | PRN
  Administered 2017-07-09: 10.4 via INTRAVENOUS
  Filled 2017-07-09: qty 11

## 2017-07-15 ENCOUNTER — Ambulatory Visit: Payer: PPO | Admitting: Cardiovascular Disease

## 2017-07-15 ENCOUNTER — Encounter: Payer: Self-pay | Admitting: Cardiovascular Disease

## 2017-07-15 DIAGNOSIS — I208 Other forms of angina pectoris: Secondary | ICD-10-CM

## 2017-07-15 NOTE — Assessment & Plan Note (Signed)
[name] returns today for follow-up of his Myoview stress test was performed 07/09/17 that was low risk and nonischemic. He has had multiple stenting procedures of a circumflex complex obtuse marginal branch. This appears somewhat improved since I saw him in the office on 07/07/17. He is on isosorbide, metoprolol and Ranexa. I'm going to have him see a mid-level provider back in 3 months me back in one year.

## 2017-07-15 NOTE — Patient Instructions (Addendum)
Medication Instructions: Your physician recommends that you continue on your current medications as directed. Please refer to the Current Medication list given to you today.  Labwork: Please fax your recent lab results from the [place] to [phone] ATTN: Dr. Gwenlyn Found.  Testing/Procedures: Your physician has requested that you have a lower extremity arterial duplex. During this test, ultrasound is used to evaluate arterial blood flow in the legs. Allow one hour for this exam. There are no restrictions or special instructions.  Your physician has requested that you have an ankle brachial index (ABI). During this test an ultrasound and blood pressure cuff are used to evaluate the arteries that supply the arms and legs with blood. Allow thirty minutes for this exam. There are no restrictions or special instructions.  [Please schedule these for January 2019.]  Follow-Up: We request that you follow-up in: 3 months with an extender and in 12 months with Dr Andria Rhein will receive a reminder letter in the mail two months in advance. If you don't receive a letter, please call our office to schedule the follow-up appointment.  If you need a refill on your cardiac medications before your next appointment, please call your pharmacy.

## 2017-07-15 NOTE — Progress Notes (Signed)
Mr. Romack returns today for follow-up of his Myoview stress test was performed 07/09/17 that was low risk and nonischemic. He has had multiple stenting procedures of a circumflex complex obtuse marginal branch. This appears somewhat improved since I saw him in the office on 07/07/17. He is on isosorbide, metoprolol and Ranexa. I'm going to have him see a mid-level provider back in 3 months me back in one year.   [name], M.D., [place], [facility], [place] [address]. [place], [place]  [address]  [phone] 07/15/2017 1:45 PM

## 2017-07-24 DIAGNOSIS — H40013 Open angle with borderline findings, low risk, bilateral: Secondary | ICD-10-CM | POA: Diagnosis not present

## 2017-07-24 DIAGNOSIS — H2513 Age-related nuclear cataract, bilateral: Secondary | ICD-10-CM | POA: Diagnosis not present

## 2017-07-24 DIAGNOSIS — E119 Type 2 diabetes mellitus without complications: Secondary | ICD-10-CM | POA: Diagnosis not present

## 2017-08-18 DIAGNOSIS — Z955 Presence of coronary angioplasty implant and graft: Secondary | ICD-10-CM | POA: Diagnosis not present

## 2017-08-18 DIAGNOSIS — E1169 Type 2 diabetes mellitus with other specified complication: Secondary | ICD-10-CM | POA: Diagnosis not present

## 2017-08-18 DIAGNOSIS — E119 Type 2 diabetes mellitus without complications: Secondary | ICD-10-CM | POA: Diagnosis not present

## 2017-08-18 DIAGNOSIS — I10 Essential (primary) hypertension: Secondary | ICD-10-CM | POA: Diagnosis not present

## 2017-08-18 DIAGNOSIS — E785 Hyperlipidemia, unspecified: Secondary | ICD-10-CM | POA: Diagnosis not present

## 2017-08-18 DIAGNOSIS — E291 Testicular hypofunction: Secondary | ICD-10-CM | POA: Diagnosis not present

## 2017-08-18 DIAGNOSIS — Z23 Encounter for immunization: Secondary | ICD-10-CM | POA: Diagnosis not present

## 2017-08-26 ENCOUNTER — Ambulatory Visit (HOSPITAL_COMMUNITY)
Admission: RE | Admit: 2017-08-26 | Discharge: 2017-08-26 | Disposition: A | Discharge status: Home / Self Care | Payer: PPO | Source: Ambulatory Visit | Attending: Cardiology | Admitting: Cardiology

## 2017-08-26 DIAGNOSIS — Z789 Other specified health status: Secondary | ICD-10-CM | POA: Diagnosis not present

## 2017-08-26 DIAGNOSIS — I2510 Atherosclerotic heart disease of native coronary artery without angina pectoris: Secondary | ICD-10-CM | POA: Diagnosis not present

## 2017-08-26 DIAGNOSIS — K219 Gastro-esophageal reflux disease without esophagitis: Secondary | ICD-10-CM | POA: Diagnosis not present

## 2017-08-26 DIAGNOSIS — E1151 Type 2 diabetes mellitus with diabetic peripheral angiopathy without gangrene: Secondary | ICD-10-CM | POA: Diagnosis not present

## 2017-08-26 DIAGNOSIS — I739 Peripheral vascular disease, unspecified: Secondary | ICD-10-CM | POA: Diagnosis not present

## 2017-08-26 DIAGNOSIS — I10 Essential (primary) hypertension: Secondary | ICD-10-CM | POA: Diagnosis not present

## 2017-08-26 DIAGNOSIS — I25708 Atherosclerosis of coronary artery bypass graft(s), unspecified, with other forms of angina pectoris: Secondary | ICD-10-CM | POA: Diagnosis not present

## 2017-08-26 DIAGNOSIS — Z7984 Long term (current) use of oral hypoglycemic drugs: Secondary | ICD-10-CM | POA: Diagnosis not present

## 2017-08-26 DIAGNOSIS — Z794 Long term (current) use of insulin: Secondary | ICD-10-CM | POA: Diagnosis not present

## 2017-08-26 DIAGNOSIS — I208 Other forms of angina pectoris: Secondary | ICD-10-CM | POA: Diagnosis not present

## 2017-08-26 DIAGNOSIS — Z955 Presence of coronary angioplasty implant and graft: Secondary | ICD-10-CM | POA: Diagnosis not present

## 2017-08-26 DIAGNOSIS — R110 Nausea: Secondary | ICD-10-CM | POA: Diagnosis not present

## 2017-08-26 DIAGNOSIS — I214 Non-ST elevation (NSTEMI) myocardial infarction: Secondary | ICD-10-CM | POA: Diagnosis not present

## 2017-08-26 DIAGNOSIS — R079 Chest pain, unspecified: Secondary | ICD-10-CM | POA: Diagnosis not present

## 2017-08-26 DIAGNOSIS — Y832 Surgical operation with anastomosis, bypass or graft as the cause of abnormal reaction of the patient, or of later complication, without mention of misadventure at the time of the procedure: Secondary | ICD-10-CM | POA: Diagnosis present

## 2017-08-26 DIAGNOSIS — Z7982 Long term (current) use of aspirin: Secondary | ICD-10-CM | POA: Diagnosis not present

## 2017-08-26 DIAGNOSIS — R0789 Other chest pain: Secondary | ICD-10-CM | POA: Diagnosis not present

## 2017-08-26 DIAGNOSIS — E1122 Type 2 diabetes mellitus with diabetic chronic kidney disease: Secondary | ICD-10-CM | POA: Diagnosis not present

## 2017-08-26 DIAGNOSIS — T82855A Stenosis of coronary artery stent, initial encounter: Secondary | ICD-10-CM | POA: Diagnosis not present

## 2017-08-26 DIAGNOSIS — E0821 Diabetes mellitus due to underlying condition with diabetic nephropathy: Secondary | ICD-10-CM | POA: Diagnosis not present

## 2017-08-26 DIAGNOSIS — E785 Hyperlipidemia, unspecified: Secondary | ICD-10-CM | POA: Diagnosis not present

## 2017-08-26 DIAGNOSIS — N182 Chronic kidney disease, stage 2 (mild): Secondary | ICD-10-CM | POA: Diagnosis not present

## 2017-08-26 DIAGNOSIS — I252 Old myocardial infarction: Secondary | ICD-10-CM | POA: Diagnosis not present

## 2017-08-26 DIAGNOSIS — Z7902 Long term (current) use of antithrombotics/antiplatelets: Secondary | ICD-10-CM | POA: Diagnosis not present

## 2017-08-26 DIAGNOSIS — E0829 Diabetes mellitus due to underlying condition with other diabetic kidney complication: Secondary | ICD-10-CM | POA: Diagnosis not present

## 2017-08-26 DIAGNOSIS — I129 Hypertensive chronic kidney disease with stage 1 through stage 4 chronic kidney disease, or unspecified chronic kidney disease: Secondary | ICD-10-CM | POA: Diagnosis not present

## 2017-08-26 DIAGNOSIS — M62838 Other muscle spasm: Secondary | ICD-10-CM | POA: Diagnosis not present

## 2017-08-28 ENCOUNTER — Other Ambulatory Visit: Payer: Self-pay | Admitting: Cardiovascular Disease

## 2017-08-28 DIAGNOSIS — I739 Peripheral vascular disease, unspecified: Secondary | ICD-10-CM

## 2017-08-29 ENCOUNTER — Other Ambulatory Visit: Payer: Self-pay

## 2017-08-29 ENCOUNTER — Emergency Department (HOSPITAL_COMMUNITY): Payer: PPO

## 2017-08-29 ENCOUNTER — Encounter (HOSPITAL_COMMUNITY): Payer: Self-pay | Admitting: Emergency Medicine

## 2017-08-29 ENCOUNTER — Inpatient Hospital Stay (HOSPITAL_COMMUNITY)
Admission: EM | Admit: 2017-08-29 | Discharge: 2017-09-02 | DRG: 250 | Disposition: A | Discharge status: Home / Self Care | Payer: PPO | Attending: Cardiovascular Disease | Admitting: Cardiovascular Disease

## 2017-08-29 DIAGNOSIS — E0821 Diabetes mellitus due to underlying condition with diabetic nephropathy: Secondary | ICD-10-CM | POA: Diagnosis not present

## 2017-08-29 DIAGNOSIS — R079 Chest pain, unspecified: Secondary | ICD-10-CM | POA: Diagnosis not present

## 2017-08-29 DIAGNOSIS — E1151 Type 2 diabetes mellitus with diabetic peripheral angiopathy without gangrene: Secondary | ICD-10-CM | POA: Diagnosis not present

## 2017-08-29 DIAGNOSIS — K219 Gastro-esophageal reflux disease without esophagitis: Secondary | ICD-10-CM | POA: Diagnosis present

## 2017-08-29 DIAGNOSIS — N182 Chronic kidney disease, stage 2 (mild): Secondary | ICD-10-CM | POA: Diagnosis present

## 2017-08-29 DIAGNOSIS — Y832 Surgical operation with anastomosis, bypass or graft as the cause of abnormal reaction of the patient, or of later complication, without mention of misadventure at the time of the procedure: Secondary | ICD-10-CM | POA: Diagnosis present

## 2017-08-29 DIAGNOSIS — I2510 Atherosclerotic heart disease of native coronary artery without angina pectoris: Secondary | ICD-10-CM | POA: Diagnosis not present

## 2017-08-29 DIAGNOSIS — Z794 Long term (current) use of insulin: Secondary | ICD-10-CM | POA: Diagnosis not present

## 2017-08-29 DIAGNOSIS — Z955 Presence of coronary angioplasty implant and graft: Secondary | ICD-10-CM

## 2017-08-29 DIAGNOSIS — T82855A Stenosis of coronary artery stent, initial encounter: Secondary | ICD-10-CM | POA: Diagnosis not present

## 2017-08-29 DIAGNOSIS — I208 Other forms of angina pectoris: Secondary | ICD-10-CM

## 2017-08-29 DIAGNOSIS — I25708 Atherosclerosis of coronary artery bypass graft(s), unspecified, with other forms of angina pectoris: Secondary | ICD-10-CM

## 2017-08-29 DIAGNOSIS — Z789 Other specified health status: Secondary | ICD-10-CM | POA: Diagnosis not present

## 2017-08-29 DIAGNOSIS — M62838 Other muscle spasm: Secondary | ICD-10-CM | POA: Diagnosis present

## 2017-08-29 DIAGNOSIS — I129 Hypertensive chronic kidney disease with stage 1 through stage 4 chronic kidney disease, or unspecified chronic kidney disease: Secondary | ICD-10-CM | POA: Diagnosis not present

## 2017-08-29 DIAGNOSIS — E785 Hyperlipidemia, unspecified: Secondary | ICD-10-CM | POA: Diagnosis present

## 2017-08-29 DIAGNOSIS — Z7984 Long term (current) use of oral hypoglycemic drugs: Secondary | ICD-10-CM

## 2017-08-29 DIAGNOSIS — E119 Type 2 diabetes mellitus without complications: Secondary | ICD-10-CM

## 2017-08-29 DIAGNOSIS — E1122 Type 2 diabetes mellitus with diabetic chronic kidney disease: Secondary | ICD-10-CM | POA: Diagnosis present

## 2017-08-29 DIAGNOSIS — I252 Old myocardial infarction: Secondary | ICD-10-CM

## 2017-08-29 DIAGNOSIS — Z7902 Long term (current) use of antithrombotics/antiplatelets: Secondary | ICD-10-CM | POA: Diagnosis not present

## 2017-08-29 DIAGNOSIS — I214 Non-ST elevation (NSTEMI) myocardial infarction: Secondary | ICD-10-CM | POA: Diagnosis not present

## 2017-08-29 DIAGNOSIS — R110 Nausea: Secondary | ICD-10-CM | POA: Diagnosis not present

## 2017-08-29 DIAGNOSIS — I10 Essential (primary) hypertension: Secondary | ICD-10-CM | POA: Diagnosis not present

## 2017-08-29 DIAGNOSIS — R0789 Other chest pain: Secondary | ICD-10-CM | POA: Diagnosis not present

## 2017-08-29 DIAGNOSIS — Z7982 Long term (current) use of aspirin: Secondary | ICD-10-CM | POA: Diagnosis not present

## 2017-08-29 DIAGNOSIS — E0829 Diabetes mellitus due to underlying condition with other diabetic kidney complication: Secondary | ICD-10-CM | POA: Diagnosis not present

## 2017-08-29 DIAGNOSIS — E118 Type 2 diabetes mellitus with unspecified complications: Secondary | ICD-10-CM

## 2017-08-29 LAB — CBC
HCT: 46.3 % (ref 39.0–52.0)
HCT: 45.2 % (ref 39.0–52.0)
Hemoglobin: 15.1 g/dL (ref 13.0–17.0)
Hemoglobin: 15.4 g/dL (ref 13.0–17.0)
MCH: 30.6 pg (ref 26.0–34.0)
MCH: 32.2 pg (ref 26.0–34.0)
MCHC: 32.6 g/dL (ref 30.0–36.0)
MCHC: 34.1 g/dL (ref 30.0–36.0)
MCV: 93.9 fL (ref 78.0–100.0)
MCV: 94.4 fL (ref 78.0–100.0)
PLATELETS: 176 10*3/uL (ref 150–400)
PLATELETS: 170 10*3/uL (ref 150–400)
RBC: 4.93 MIL/uL (ref 4.22–5.81)
RBC: 4.79 MIL/uL (ref 4.22–5.81)
RDW: 13.7 % (ref 11.5–15.5)
RDW: 13.5 % (ref 11.5–15.5)
WBC: 7.1 10*3/uL (ref 4.0–10.5)
WBC: 9.5 10*3/uL (ref 4.0–10.5)

## 2017-08-29 LAB — COMPREHENSIVE METABOLIC PANEL
ALT: 13 U/L — ABNORMAL LOW (ref 17–63)
ALT: 15 U/L — AB (ref 17–63)
ANION GAP: 10 (ref 5–15)
AST: 22 U/L (ref 15–41)
AST: 37 U/L (ref 15–41)
Albumin: 4 g/dL (ref 3.5–5.0)
Albumin: 3.9 g/dL (ref 3.5–5.0)
Alkaline Phosphatase: 43 U/L (ref 38–126)
Alkaline Phosphatase: 44 U/L (ref 38–126)
Anion gap: 11 (ref 5–15)
BILIRUBIN TOTAL: 1.6 mg/dL — AB (ref 0.3–1.2)
BUN: 15 mg/dL (ref 6–20)
BUN: 12 mg/dL (ref 6–20)
CHLORIDE: 101 mmol/L (ref 101–111)
CO2: 23 mmol/L (ref 22–32)
CO2: 23 mmol/L (ref 22–32)
CREATININE: 1.23 mg/dL (ref 0.61–1.24)
Calcium: 9.6 mg/dL (ref 8.9–10.3)
Calcium: 9.1 mg/dL (ref 8.9–10.3)
Chloride: 103 mmol/L (ref 101–111)
Creatinine, Ser: 1.19 mg/dL (ref 0.61–1.24)
GFR calc Af Amer: >60 mL/min (ref >60)
GFR calc Af Amer: >60 mL/min (ref >60)
GFR calc non Af Amer: 58 mL/min — ABNORMAL LOW (ref >60)
Glucose, Bld: 171 mg/dL — ABNORMAL HIGH (ref 65–99)
Glucose, Bld: 158 mg/dL — ABNORMAL HIGH (ref 65–99)
POTASSIUM: 4.1 mmol/L (ref 3.5–5.1)
Potassium: 3.8 mmol/L (ref 3.5–5.1)
SODIUM: 135 mmol/L (ref 135–145)
Sodium: 136 mmol/L (ref 135–145)
TOTAL PROTEIN: 7.5 g/dL (ref 6.5–8.1)
Total Bilirubin: 1.8 mg/dL — ABNORMAL HIGH (ref 0.3–1.2)
Total Protein: 7.2 g/dL (ref 6.5–8.1)

## 2017-08-29 LAB — GLUCOSE, CAPILLARY: GLUCOSE-CAPILLARY: 168 mg/dL — AB (ref 65–99)

## 2017-08-29 LAB — BRAIN NATRIURETIC PEPTIDE: B Natriuretic Peptide: 18 pg/mL (ref 0.0–100.0)

## 2017-08-29 LAB — MAGNESIUM: MAGNESIUM: 1.6 mg/dL — AB (ref 1.7–2.4)

## 2017-08-29 LAB — TROPONIN I

## 2017-08-29 MED ORDER — CO Q-10 400 MG PO CAPS: 400.0000 mg | ORAL_CAPSULE | Freq: Every day | ORAL | Status: DC

## 2017-08-29 MED ORDER — NITROGLYCERIN 0.4 MG SL SUBL
0.4000 mg | SUBLINGUAL_TABLET | SUBLINGUAL | Status: DC | PRN
  Administered 2017-08-30 (×3): 0.4 mg via SUBLINGUAL
  Filled 2017-08-29 (×2): qty 1

## 2017-08-29 MED ORDER — ASPIRIN EC 81 MG PO TBEC
81.0000 mg | DELAYED_RELEASE_TABLET | Freq: Every day | ORAL | Status: DC
  Administered 2017-08-30: 81 mg via ORAL
  Filled 2017-08-29: qty 1

## 2017-08-29 MED ORDER — NITROGLYCERIN 0.4 MG SL SUBL
SUBLINGUAL_TABLET | SUBLINGUAL | Status: AC
  Filled 2017-08-29: qty 1

## 2017-08-29 MED ORDER — METOPROLOL SUCCINATE ER 25 MG PO TB24
12.5000 mg | ORAL_TABLET | Freq: Every day | ORAL | Status: DC
  Administered 2017-08-29 – 2017-08-31 (×3): 12.5 mg via ORAL
  Filled 2017-08-29 (×3): qty 1

## 2017-08-29 MED ORDER — METOPROLOL TARTRATE 5 MG/5ML IV SOLN
5.0000 mg | Freq: Once | INTRAVENOUS | Status: AC
  Administered 2017-08-29: 5 mg via INTRAVENOUS
  Filled 2017-08-29: qty 5

## 2017-08-29 MED ORDER — ASPIRIN 81 MG PO CHEW
324.0000 mg | CHEWABLE_TABLET | Freq: Once | ORAL | Status: AC
  Administered 2017-08-30: 324 mg via ORAL
  Filled 2017-08-29: qty 4

## 2017-08-29 MED ORDER — ACETAMINOPHEN 500 MG PO TABS: 1000.0000 mg | ORAL_TABLET | Freq: Four times a day (QID) | ORAL | Status: DC | PRN

## 2017-08-29 MED ORDER — HYDROMORPHONE HCL 1 MG/ML IJ SOLN
1.0000 mg | Freq: Once | INTRAMUSCULAR | Status: AC
  Administered 2017-08-29: 1 mg via INTRAVENOUS
  Filled 2017-08-29: qty 1

## 2017-08-29 MED ORDER — CLOPIDOGREL BISULFATE 75 MG PO TABS
75.0000 mg | ORAL_TABLET | Freq: Every day | ORAL | Status: DC
  Administered 2017-08-29 – 2017-08-30 (×2): 75 mg via ORAL
  Filled 2017-08-29 (×2): qty 1

## 2017-08-29 MED ORDER — HEPARIN (PORCINE) IN NACL 100-0.45 UNIT/ML-% IJ SOLN
1250.0000 [IU]/h | INTRAMUSCULAR | Status: DC
  Administered 2017-08-30 (×2): 1250 [IU]/h via INTRAVENOUS
  Filled 2017-08-29 (×2): qty 250

## 2017-08-29 MED ORDER — HEPARIN SODIUM (PORCINE) 5000 UNIT/ML IJ SOLN
5000.0000 [IU] | Freq: Three times a day (TID) | INTRAMUSCULAR | Status: DC
  Administered 2017-08-29: 5000 [IU] via SUBCUTANEOUS
  Filled 2017-08-29: qty 1

## 2017-08-29 MED ORDER — CYCLOBENZAPRINE HCL 10 MG PO TABS
10.0000 mg | ORAL_TABLET | Freq: Three times a day (TID) | ORAL | Status: DC | PRN
  Administered 2017-08-29 – 2017-08-30 (×2): 10 mg via ORAL
  Filled 2017-08-29 (×2): qty 1

## 2017-08-29 MED ORDER — MORPHINE SULFATE (PF) 4 MG/ML IV SOLN
6.0000 mg | Freq: Once | INTRAVENOUS | Status: AC
  Administered 2017-08-29: 6 mg via INTRAVENOUS
  Filled 2017-08-29: qty 2

## 2017-08-29 MED ORDER — ENOXAPARIN SODIUM 100 MG/ML ~~LOC~~ SOLN
1.0000 mg/kg | Freq: Once | SUBCUTANEOUS | Status: AC
  Administered 2017-08-29: 95 mg via SUBCUTANEOUS
  Filled 2017-08-29: qty 1

## 2017-08-29 MED ORDER — GI COCKTAIL ~~LOC~~
30.0000 mL | Freq: Once | ORAL | Status: AC
  Administered 2017-08-29: 30 mL via ORAL
  Filled 2017-08-29: qty 30

## 2017-08-29 MED ORDER — NITROGLYCERIN 0.4 MG SL SUBL
0.4000 mg | SUBLINGUAL_TABLET | Freq: Once | SUBLINGUAL | Status: AC
Start: 2017-08-29 — End: 2017-08-30
  Administered 2017-08-29: 0.4 mg via SUBLINGUAL

## 2017-08-29 MED ORDER — MORPHINE SULFATE (PF) 2 MG/ML IV SOLN
2.0000 mg | Freq: Once | INTRAVENOUS | Status: AC
  Administered 2017-08-29: 2 mg via INTRAVENOUS
  Filled 2017-08-29: qty 1

## 2017-08-29 MED ORDER — ISOSORBIDE MONONITRATE ER 60 MG PO TB24
60.0000 mg | ORAL_TABLET | Freq: Every day | ORAL | Status: DC
  Administered 2017-08-29 – 2017-08-31 (×3): 60 mg via ORAL
  Filled 2017-08-29 (×3): qty 1

## 2017-08-29 MED ORDER — PANTOPRAZOLE SODIUM 40 MG PO TBEC
40.0000 mg | DELAYED_RELEASE_TABLET | Freq: Every day | ORAL | Status: DC
  Administered 2017-08-30 – 2017-09-02 (×4): 40 mg via ORAL
  Filled 2017-08-29 (×5): qty 1

## 2017-08-29 MED ORDER — HYDROMORPHONE HCL 1 MG/ML IJ SOLN: 0.5000 mg | Freq: Once | INTRAMUSCULAR | Status: DC

## 2017-08-29 MED ORDER — LORATADINE 10 MG PO TABS
10.0000 mg | ORAL_TABLET | Freq: Every day | ORAL | Status: DC
  Administered 2017-08-29 – 2017-09-02 (×5): 10 mg via ORAL
  Filled 2017-08-29 (×5): qty 1

## 2017-08-29 NOTE — ED Notes (Signed)
Awaiting bed assignment.

## 2017-08-29 NOTE — ED Provider Notes (Signed)
Emergency Department Provider Note   I have reviewed the triage vital signs and the nursing notes.   HISTORY  Chief Complaint Chest Pain   HPI [name] is a 72 y.o. male with a past medical history of CHF, coronary artery disease status post multiple stents, hyperlipidemia, reflux, diabetes, hypertension, peripheral vascular disease who presents to the emergency department today with left-sided chest pain that started while exercising today on the treadmill.  Patient states this is similar to previous episodes of myocardial infarction.  He states that he had a fleeting episode of chest pain 2 days ago that relieved with nitroglycerin.  The same pain returned today that sharp and stabbing in nature to the left of his sternum similar to a couple days ago but much more severe and not relieved after 3 doses of nitroglycerin.  Also has some nausea with it but no shortness of breath, vomiting, diaphoresis or radiation of his discomfort.  He specifically states it feels like previous episode of when he needed a stent.  No recent fevers or cough.  No diarrhea or constipation.  No history of blood clot or lower extremity swelling at this time. No other associated or modifying symptoms.    Past Medical History:  Diagnosis Date  . CHF (congestive heart failure) ([place])   . Coronary artery disease    s/p multiple caths 2012, stenting  . Diabetes mellitus   . GERD (gastroesophageal reflux disease)   . Hyperlipidemia   . Hypertension   . PVD (peripheral vascular disease) ([place])    left SFA PTA & stenting in 02/2005 (Dr. Adora Fridge)    Patient Active Problem List   Diagnosis Date Noted  . Heme positive stool 01/12/2017  . GERD (gastroesophageal reflux disease) 01/09/2017  . Hepatomegaly 11/08/2014  . Chest pain 09/07/2013  . HTN (hypertension) 09/07/2013  . CAD ISR OM1 Rx'd with DES this admission 07/16/2011  . Stented coronary artery 07/16/2011  . DM (diabetes mellitus) ([place]) 07/16/2011    . Dyslipidemia 07/16/2011  . Statin intolerance 07/16/2011  . Peripheral vascular disease ([place]) 01/10/2010  . GASTROESOPHAGEAL REFLUX DISEASE, SEVERE 01/10/2010    Past Surgical History:  Procedure Laterality Date  . CARDIAC CATHETERIZATION  12/23/2004   normal L main, normal LAD, normal L Cfx, RCA with 20% hypodense lesion in first end of vessel (Dr. Adora Fridge)  . CARDIAC CATHETERIZATION  08/26/2007   no significant CAD by cath, EF 50% (Dr. Jackie Plum)  . CARDIAC CATHETERIZATION  08/28/2010   stent to OM1 with 2.0x63mm BMS (Dr. Adora Fridge)  . CARDIAC CATHETERIZATION  09/11/2010   patent stent (Dr. Roni Bread)  . CARDIAC CATHETERIZATION  02/10/2011   95% prox in-stent restenosis within OM stent - opened with cutting balloon (Dr. Corky Downs)  . CARDIAC CATHETERIZATION  07/17/2011   in-stent restenosis - re-stented with Promus 2.25x52mm DES (Dr. Roni Bread)  . COLONOSCOPY  12/2009   Dr. Hampton Abbot  . COLONOSCOPY N/A 01/30/2017   Procedure: COLONOSCOPY;  Surgeon: Daneil Dolin, MD;  Location: [facility];  Service: Endoscopy;  Laterality: N/A;  8:15am  . 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 FEMORAL ARTERY STENT  02/27/2005   L SFA stenting - [place] down SFA across lesion - predilatation with 4x4 Powerflex, stenting with 7x4 Smart, post-dilatation with 6x4 powerflex (Dr. Adora Fridge)  . LEFT HEART CATHETERIZATION WITH CORONARY ANGIOGRAM N/A 07/17/2011   Procedure: LEFT  HEART CATHETERIZATION WITH CORONARY ANGIOGRAM;  Surgeon: Leonie Man, MD;  Location: [facility] CATH LAB;  Service: Cardiovascular;  Laterality: N/A;  Right radial approach  . left knee arthroscopy  05/2016  . NM MYOCAR PERF WALL MOTION  09/08/2013   abnormal lexiscan - low to intermediate risk;   . TRANSTHORACIC ECHOCARDIOGRAM  09/08/2013   EF 50-55%, mild LVH, grade 1 diastolic dysfunction, mildly calcified AV  annulus, calcified MV, LA mildly dilated,     Current Outpatient Rx  . Order #: [id number] Class: Historical Med  . Order #: [id number] Class: Historical Med  . Order #: [id number] Class: Normal  . Order #: [id number] Class: Historical Med  . Order #: [id number] Class: Historical Med  . Order #: [id number] Class: Normal  . Order #: [id number] Class: Historical Med  . Order #: [id number] Class: Historical Med  . Order #: [id number] Class: Historical Med  . Order #: [id number] Class: Normal  . Order #: [id number] Class: Historical Med  . Order #: [id number] Class: Normal  . Order #: [id number] Class: Normal  . Order #: [id number] Class: Historical Med  . Order #: [id number] Class: Historical Med  . Order #: [id number] Class: Normal  . Order #: [id number] Class: Print  . Order #: [id number] Class: Historical Med    Allergies Propoxyphene n-acetaminophen; Statins; Tape; Flomax [tamsulosin hcl]; Levaquin [levofloxacin hemihydrate]; and Penicillins  Family History  Problem Relation Age of Onset  . Arrhythmia Mother 55  . Colon cancer Neg Hx   . Liver disease Neg Hx   . Inflammatory bowel disease Neg Hx   . Colon polyps Neg Hx     Social History Social History   Tobacco Use  . Smoking status: Never Smoker  . Smokeless tobacco: Never Used  . Tobacco comment: quit 10 yrs ago  Substance Use Topics  . Alcohol use: No  . Drug use: No    Review of Systems  All other systems negative except as documented in the HPI. All pertinent positives and negatives as reviewed in the HPI. ____________________________________________   PHYSICAL EXAM:  VITAL SIGNS: ED Triage Vitals  Enc Vitals Group     BP 08/29/17 1244 (!) 168/95     Pulse Rate 08/29/17 1244 85     Resp 08/29/17 1244 20     Temp 08/29/17 1244 98 F (36.7 C)     Temp src --      SpO2 08/29/17 1244 100 %     Weight 08/29/17 1246 210 lb (95.3 kg)     Height 08/29/17 1246 6' (1.829 m)    Constitutional: Alert and oriented. Well appearing and in no  acute distress. Eyes: Conjunctivae are normal. PERRL. EOMI. Head: Atraumatic. Nose: No congestion/rhinnorhea. Mouth/Throat: Mucous membranes are moist.  Oropharynx non-erythematous. Neck: No stridor.  No meningeal signs.   Cardiovascular: Normal rate, regular rhythm. Good peripheral circulation. Grossly normal heart sounds.   Respiratory: Normal respiratory effort.  No retractions. Lungs CTAB. Gastrointestinal: Soft and nontender. No distention.  Musculoskeletal: No lower extremity tenderness nor edema. No gross deformities of extremities. Neurologic:  Normal speech and language. No gross focal neurologic deficits are appreciated.  Skin:  Skin is warm, dry and intact. No rash noted.   ____________________________________________   LABS (all labs ordered are listed, but only abnormal results are displayed)  Labs Reviewed  COMPREHENSIVE METABOLIC PANEL - Abnormal; Notable for the following components:      Result Value   Glucose, Bld 171 (*)    ALT 13 (*)    Total Bilirubin 1.6 (*)    All other components within normal  limits  CBC  TROPONIN I  BRAIN NATRIURETIC PEPTIDE   ____________________________________________  EKG   EKG Interpretation  Date/Time:  Saturday August 29 2017 12:42:57 EST Ventricular Rate:  86 PR Interval:    QRS Duration: 93 QT Interval:  358 QTC Calculation: 429 R Axis:   33 Text Interpretation:  Sinus rhythm slight flattening of T waves in aVL, otherwise no significant change from january 2015 Confirmed by Merrily Pew [id number]) on 08/29/2017 12:55:58 PM       ____________________________________________  RADIOLOGY  Dg Chest 2 View  Result Date: 08/29/2017 CLINICAL DATA:  Chest pain EXAM: CHEST  2 VIEW COMPARISON:  November 24, 2016 FINDINGS: There is no edema or consolidation. Heart size and pulmonary vascularity are normal. No adenopathy. There is aortic atherosclerosis. There is a small metallic foreign body in the anterior right hemithorax,  stable. No pneumothorax. There is degenerative change in the thoracic spine. IMPRESSION: No edema or consolidation.  Aortic atherosclerosis. Aortic Atherosclerosis (ICD10-I70.0). Electronically Signed   By: Lowella Grip III M.D.   On: 08/29/2017 13:37    ____________________________________________   PROCEDURES  Procedure(s) performed:   Procedures   ____________________________________________   INITIAL IMPRESSION / ASSESSMENT AND PLAN / ED COURSE  It appears that the patient's most recent myocardial perfusion study that he did have some ST depression with exercise however was a symptomatic at that time.  He is been followed by cardiology for stable angina however at this time concern for unstable angina.  His troponin is negative and his EKG is relatively unremarkable compared to previous ones however this pain has happened twice and is not going away now.  Does seem to be exertional in its onset. Will discuss with cardiology about appropriateness of admission here vs transfer there for UA.   I discussed case with Dr. Rayann Heman with cardiology who accepts the patient to the stepdown unit at [facility] because of continued pain and concern for possible unstable angina.  However the symptoms do have some atypical component to them so if his chest pain resolves with the medications here then can be admitted to the telemetry at [facility] and we need updated bed request.     Pertinent labs & imaging results that were available during my care of the patient were reviewed by me and considered in my medical decision making (see chart for details).  ____________________________________________  FINAL CLINICAL IMPRESSION(S) / ED DIAGNOSES  Final diagnoses:  Nonspecific chest pain     MEDICATIONS GIVEN DURING THIS VISIT:  Medications  enoxaparin (LOVENOX) injection 95 mg (not administered)  metoprolol tartrate (LOPRESSOR) injection 5 mg (not administered)  aspirin chewable tablet  324 mg (324 mg Oral Not Given 08/29/17 1513)  nitroGLYCERIN (NITROSTAT) SL tablet 0.4 mg (0.4 mg Sublingual Given 08/29/17 1316)  morphine 4 MG/ML injection 6 mg (6 mg Intravenous Given 08/29/17 1404)  gi cocktail (Maalox,Lidocaine,Donnatal) (30 mLs Oral Given 08/29/17 1404)  HYDROmorphone (DILAUDID) injection 1 mg (1 mg Intravenous Given 08/29/17 1503)      Brad Mcgaughy, Corene Cornea, MD 08/29/17 3419

## 2017-08-29 NOTE — ED Notes (Signed)
[facility] in retrieve pt

## 2017-08-29 NOTE — Progress Notes (Signed)
Patient arrived to the unit. He is alert and oriented and denies having any pain. Patient VS 140/84, HR 97, RR 18. Patient is resting comfortably in bed. Paged Dr. Rayann Heman to inform him that patient has arrived and need orders.

## 2017-08-29 NOTE — ED Notes (Signed)
From Rad 

## 2017-08-29 NOTE — Progress Notes (Signed)
[place] for heparin Indication: chest pain/ACS  Heparin Dosing Weight: 95.3 kg  Labs: Recent Labs    08/29/17 1249 08/29/17 2012  HGB 15.1 15.4  HCT 46.3 45.2  PLT 176 170  CREATININE 1.19 1.23  TROPONINI <0.03 0.81*    Assessment: 45 yom admitted with CP. Pharmacy consulted to dose heparin for ACS. Not on anticoagulation PTA. Received dose Lovenox 1 mg/kg at 1527 today and also dose of heparin [place] x 1 at 2140. CBC wnl. No bleed documented.  Goal of Therapy:  Heparin level 0.3-0.7 units/ml Monitor platelets by anticoagulation protocol: Yes   Plan:  No bolus Start heparin at 1250 units/h when next dose of Lovenox would be due 8h heparin level Daily heparin level/CBC Monitor s/sx bleeding  Elicia Lamp, PharmD, BCPS Clinical Pharmacist 08/29/2017 10:23 PM

## 2017-08-29 NOTE — ED Triage Notes (Signed)
PT c/o mid/substernal chest pain that started this am while walking on his treadmill at home unrelieved by 2 nitro tablets he gave himself. EMS administered 4mg  of morphine with 4 81mg  aspirin tablets and 4mg  of zofran with no relief. PT still rates pain at level 9 and states it is sharp.

## 2017-08-29 NOTE — ED Notes (Signed)
To rad 

## 2017-08-29 NOTE — ED Notes (Signed)
Pt reports no relief from meds  Dr Jerilynn Mages informed

## 2017-08-29 NOTE — ED Notes (Signed)
Pt reports he take a 324 asa daily

## 2017-08-29 NOTE — H&P (Signed)
Cardiology Admission History and Physical:   Patient ID: [name]; MRN: [id number]; DOB: 16-May-1946   Admission date: 08/29/2017  Primary Care Provider: [name], MD Primary Cardiologist: Dr. Gwenlyn Found Primary Electrophysiologist:  None  Chief Complaint:  Chest pain   Patient Profile:   Mr. Hockett is a 72 year old man with a history of CAD s/p muliple PCI to OM1, PVD s/p PCI (SFA, PTA), HTN, DM2, GERD, who presents with chest pain.  History of Present Illness:   Mr. Brigham is a 72 year old man with a history of CAD s/p muliple PCI to OM1, PVD s/p PCI (SFA, PTA), HTN, DM2, GERD, who presents with chest pain.  The patient has a history of complex vascular disease for which he follows with Dr. Gwenlyn Found. He has prior PCI to [place] with subsequent restenosis for which he has had repeat balloon angioplasty and subsequent PCI. He has been treated with Ranexa for chronic angina. He was seen in clinic in 06/2017, at which time he complained of worsening exertional dyspnea. He underwent repeat nuclear stress test that was described to be low risk and showed small inferior defect that was felt to be due to tissue attenuation.   Mr. Cedillos presented to the [facility] ED with recurrent chest pain. He reported that he was walking on the treadmill and subsequently developed a cramping sensation in his chest. He had some associated nausea without dyspnea. He reports that he had a similar episode several days ago, although he tells me that this pain is different than his prior chest pain.  At the ED, the patient was hypertensive to 168/95. ECG showed sinus rhythm with flattening in AVL without other acute changes. Troponin was negative x1. Transfer to [facility] was coordinated.  At the time of my evaluation following the patient's arrival, he reported that he was chest pain free. Repeat ECG was similar to prior. Several hours later, the patient's troponin resulted positive at 0.81 and heparin gtt was initiated.     Past Medical History:  Diagnosis Date  . CHF (congestive heart failure) ([place])   . Coronary artery disease    s/p multiple caths 2012, stenting  . Diabetes mellitus   . GERD (gastroesophageal reflux disease)   . Hyperlipidemia   . Hypertension   . PVD (peripheral vascular disease) ([place])    left SFA PTA & stenting in 02/2005 (Dr. Adora Fridge)    Past Surgical History:  Procedure Laterality Date  . CARDIAC CATHETERIZATION  12/23/2004   normal L main, normal LAD, normal L Cfx, RCA with 20% hypodense lesion in first end of vessel (Dr. Adora Fridge)  . CARDIAC CATHETERIZATION  08/26/2007   no significant CAD by cath, EF 50% (Dr. Jackie Plum)  . CARDIAC CATHETERIZATION  08/28/2010   stent to OM1 with 2.0x62m BMS (Dr. JAdora Fridge  . CARDIAC CATHETERIZATION  09/11/2010   patent stent (Dr. DRoni Bread  . CARDIAC CATHETERIZATION  02/10/2011   95% prox in-stent restenosis within OM stent - opened with cutting balloon (Dr. TCorky Downs  . CARDIAC CATHETERIZATION  07/17/2011   in-stent restenosis - re-stented with Promus 2.25x231mDES (Dr. D.Roni Bread . COLONOSCOPY  12/2009   Dr. JeHampton Abbot. COLONOSCOPY N/A 01/30/2017   Procedure: COLONOSCOPY;  Surgeon: RoDaneil DolinMD;  Location: [facility];  Service: Endoscopy;  Laterality: N/A;  8:15am  . 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  FEMORAL ARTERY STENT  02/27/2005   L SFA stenting - [place] down SFA across lesion - predilatation with 4x4 Powerflex, stenting with 7x4 Smart, post-dilatation with 6x4 powerflex (Dr. Adora Fridge)  . LEFT HEART CATHETERIZATION WITH CORONARY ANGIOGRAM N/A 07/17/2011   Procedure: LEFT HEART CATHETERIZATION WITH CORONARY ANGIOGRAM;  Surgeon: Leonie Man, MD;  Location: [facility] CATH LAB;  Service: Cardiovascular;  Laterality: N/A;  Right radial approach  . left knee arthroscopy  05/2016  . NM MYOCAR PERF WALL  MOTION  09/08/2013   abnormal lexiscan - low to intermediate risk;   . TRANSTHORACIC ECHOCARDIOGRAM  09/08/2013   EF 50-55%, mild LVH, grade 1 diastolic dysfunction, mildly calcified AV annulus, calcified MV, LA mildly dilated,      Medications Prior to Admission: Prior to Admission medications   Medication Sig Start Date End Date Taking? Authorizing Provider  acetaminophen (TYLENOL) 500 MG tablet Take 1,000 mg by mouth every 6 (six) hours as needed for mild pain.   Yes [provider]  aspirin (ECOTRIN) 325 MG EC tablet Take 325 mg by mouth daily.   Yes [provider]  clopidogrel (PLAVIX) 75 MG tablet TAKE ONE TABLET BY MOUTH ONCE DAILY. 12/07/15  Yes [name], MD  Coenzyme Q10 (CO Q-10) 400 MG CAPS Take 400 mg by mouth daily.   Yes [provider]  cyclobenzaprine (FLEXERIL) 10 MG tablet Take 10 mg by mouth 3 (three) times daily as needed for muscle spasms.   Yes [provider]  dexlansoprazole (DEXILANT) 60 MG capsule Take 1 capsule (60 mg total) by mouth daily before breakfast. 01/09/17  Yes Annitta Needs, NP  dextromethorphan-guaiFENesin [facility] DM) 30-600 MG 12hr tablet Take 1 tablet by mouth 2 (two) times daily.   Yes [provider]  fexofenadine (ALLEGRA) 180 MG tablet Take 180 mg by mouth daily.     Yes [provider]  glipiZIDE-metformin (METAGLIP) 5-500 MG per tablet Take 1 tablet by mouth every evening.   Yes [provider]  isosorbide mononitrate (IMDUR) 60 MG 24 hr tablet Take 1 tablet (60 mg total) by mouth daily. 07/07/17  Yes [name], MD  loratadine (CLARITIN) 10 MG tablet Take 10 mg by mouth daily.   Yes [provider]  metoprolol succinate (TOPROL-XL) 25 MG 24 hr tablet TAKE 1/2 TABLET BY MOUTH DAILY. 09/29/16  Yes [name], MD  nitroGLYCERIN (NITROSTAT) 0.4 MG SL tablet Place 1 tablet (0.4 mg total) under the tongue every 5 (five) minutes as needed. For chest pain 07/07/17   Yes [name], MD  pantoprazole (PROTONIX) 40 MG tablet Take 40 mg by mouth daily.   Yes [provider]  pseudoephedrine (SUDAFED) 30 MG tablet Take 30 mg by mouth every 4 (four) hours as needed for congestion.   Yes [provider]  ramipril (ALTACE) 5 MG capsule TAKE (1) CAPSULE BY MOUTH ONCE DAILY. 09/13/15  Yes [name], MD  ranolazine (RANEXA) 500 MG 12 hr tablet Take 1 tablet (500 mg total) by mouth 2 (two) times daily. Patient taking differently: Take 500 mg by mouth daily.  11/12/15  Yes [name], MD  testosterone cypionate (DEPOTESTOSTERONE CYPIONATE) 200 MG/ML injection Inject 100 mg into the muscle once a week.  06/24/16  Yes [provider]     Allergies:    Allergies  Allergen Reactions  . Propoxyphene N-Acetaminophen Nausea Only  . Statins Other (See Comments)    Severe muscle cramping/aching/pain  .  Tape     Blisters  . Flomax [Tamsulosin Hcl] Rash  . Levaquin [Levofloxacin Hemihydrate] Rash  . Penicillins Rash    Broke out in rash 6 years ago, pt recently took penicillin (09/2016) and had no reaction Has patient had a PCN reaction causing immediate rash, facial/tongue/throat swelling, SOB or lightheadedness with hypotension: Yes Has patient had a PCN reaction causing severe rash involving mucus membranes or skin necrosis: Unknown Has patient had a PCN reaction that required hospitalization: No Has patient had a PCN reaction occurring within the last 10 years: Yes If all of the above answers are "NO", then m    Social History:   Social History   Socioeconomic History  . Marital status: Married    Spouse name: Not on file  . Number of children: 3  . Years of education: Not on file  . Highest education level: Not on file  Social Needs  . Financial resource strain: Not on file  . Food insecurity - worry: Not on file  . Food insecurity - inability: Not on file  . Transportation needs - medical: Not on file  .  Transportation needs - non-medical: Not on file  Occupational History  . Occupation: Retired    Fish farm manager: [employer]  Tobacco Use  . Smoking status: Never Smoker  . Smokeless tobacco: Never Used  . Tobacco comment: quit 10 yrs ago  Substance and Sexual Activity  . Alcohol use: No  . Drug use: No  . Sexual activity: No  Other Topics Concern  . Not on file  Social History Narrative  . Not on file    Family History: The patient's family history includes Arrhythmia (age of onset: 3) in his mother. There is no history of Colon cancer, Liver disease, Inflammatory bowel disease, or Colon polyps.    ROS:  All other ROS reviewed and negative.     Physical Exam/Data:   Vitals:   08/29/17 1700 08/29/17 1807 08/29/17 2010 08/29/17 2040  BP: (!) 140/95 140/84  (!) 148/85  Pulse: 90 84  83  Resp: (!) 28 18    Temp:  98.2 F (36.8 C) 98.1 F (36.7 C) 97.9 F (36.6 C)  TempSrc:  Oral Oral Oral  SpO2: 97% 93% 94% 96%  Weight:      Height:        Intake/Output Summary (Last 24 hours) at 08/30/2017 0038 Last data filed at 08/29/2017 1837 Gross per 24 hour  Intake 240 ml  Output -  Net 240 ml   Filed Weights   08/29/17 1246  Weight: 95.3 kg (210 lb)   Body mass index is 28.48 kg/m.  General:  Well nourished, well developed, in no acute distress  HEENT: normal Lymph: no adenopathy Neck: no JVD Cardiac:  normal S1, S2; RRR; no murmur Lungs:  clear to auscultation bilaterally, no wheezing, rhonchi or rales  Abd: soft, nontender, no hepatomegaly  Ext: no LE edema Musculoskeletal:  No deformities, BUE and BLE strength normal and equal Skin: warm and dry  Neuro:  No focal abnormalities noted Psych:  Normal affect    EKG:  The ECG that was done was personally reviewed and demonstrates NSR with flattening in AVL  Relevant CV Studies: Nuclear Stress Test 06/2017:  Small idefect in the inferior wall (base, mid) consistent with probable soft tissue attenuation. No  significant ischemia or scar.  Nuclear stress EF: 56%.  This is a low risk study.  TTE 08/2013:  - Mild LVH with LVEF  50-55%, probable mid inferoseptal hypokinesis, grade 1 diastolic dysfunction. MAC with trivial mitral regurgitation. Mild left atrial enlargement. Unable to assess PASP.  Laboratory Data:  Chemistry Recent Labs  Lab 08/29/17 1249 08/29/17 2012  NA 136 135  K 4.1 3.8  CL 103 101  CO2 23 23  GLUCOSE 171* 158*  BUN 15 12  CREATININE 1.19 1.23  CALCIUM 9.6 9.1  GFRNONAA >60 58*  GFRAA >60 >60  ANIONGAP 10 11    Recent Labs  Lab 08/29/17 1249 08/29/17 2012  PROT 7.5 7.2  ALBUMIN 4.0 3.9  AST 22 37  ALT 13* 15*  ALKPHOS 43 44  BILITOT 1.6* 1.8*   Hematology Recent Labs  Lab 08/29/17 1249 08/29/17 2012  WBC 7.1 9.5  RBC 4.93 4.79  HGB 15.1 15.4  HCT 46.3 45.2  MCV 93.9 94.4  MCH 30.6 32.2  MCHC 32.6 34.1  RDW 13.7 13.5  PLT 176 170   Cardiac Enzymes Recent Labs  Lab 08/29/17 1249 08/29/17 2012  TROPONINI <0.03 0.81*   No results for input(s): TROPIPOC in the last 168 hours.  BNP Recent Labs  Lab 08/29/17 1250  BNP 18.0    DDimer No results for input(s): DDIMER in the last 168 hours.  Radiology/Studies:  Dg Chest 2 View  Result Date: 08/29/2017 CLINICAL DATA:  Chest pain EXAM: CHEST  2 VIEW COMPARISON:  November 24, 2016 FINDINGS: There is no edema or consolidation. Heart size and pulmonary vascularity are normal. No adenopathy. There is aortic atherosclerosis. There is a small metallic foreign body in the anterior right hemithorax, stable. No pneumothorax. There is degenerative change in the thoracic spine. IMPRESSION: No edema or consolidation.  Aortic atherosclerosis. Aortic Atherosclerosis (ICD10-I70.0). Electronically Signed   By: Lowella Grip III M.D.   On: 08/29/2017 13:37    Assessment and Plan:    Mr. Secrist is a 72 year old man with a history of CAD s/p muliple PCI to OM1, PVD s/p PCI (SFA, PTA), HTN, DM2, GERD, who  presents with chest pain.  CAD s/p PCI Chest pain with positive troponin  The patient has known CAD with chronic angina and recent low-risk stress test who presents with chest pain. He His described pain has typical and atypical features. ECG and initial troponin were reassuring, but repeat troponin level is elevated. His presentation therefore raises concern for NSTEMI. At this time, will treat empirically for ACS.  -Continue to monitor on telemetry -s/p ASA 325. Continue 81 mg daily and daily clopidogrel -Unable to tolerate statin therapy -Continue home metoprolol, ranolazine  -NTG PRN -Repeat echocardiogram ordered -NPO at midnight for possible LHC  HTN BP moderately elevated in the ED -Continue home metoprolol, ramipril  CKD Cr 1.2 on admission, which is at baseline  PVD -Continue home DAPT  DM2 -Holding home oral agents while hospitalized -SSI ordered   Muscle spasm -Continue home flexeril   GERD -Continue hmoe flexeril     Severity of Illness: The appropriate patient status for this patient is INPATIENT. Inpatient status is judged to be reasonable and necessary in order to provide the required intensity of service to ensure the patient's safety. The patient's presenting symptoms, physical exam findings, and initial radiographic and laboratory data in the context of their chronic comorbidities is felt to place them at high risk for further clinical deterioration. Furthermore, it is not anticipated that the patient will be medically stable for discharge from the hospital within 2 midnights of admission. The following factors support the patient  status of inpatient.   " The patient's presenting symptoms include chest pain. " The worrisome physical exam findings include n/a. " The initial radiographic and laboratory data are worrisome because of positive troponin. " The chronic co-morbidities include CAD, DM   * I certify that at the point of admission it is my clinical  judgment that the patient will require inpatient hospital care spanning beyond 2 midnights from the point of admission due to high intensity of service, high risk for further deterioration and high frequency of surveillance required.*       For questions or updates, please contact [place] Please consult [email or web address] for contact info under Cardiology/STEMI.    Signed, Nila Nephew, MD  08/30/2017 12:38 AM

## 2017-08-29 NOTE — ED Notes (Signed)
Dr Sabra Heck spoke c pt

## 2017-08-29 NOTE — ED Notes (Signed)
Call to give report  Jenny Reichmann, RN unavailable will call back

## 2017-08-29 NOTE — ED Notes (Signed)
Report to Cindy, RN.

## 2017-08-29 NOTE — ED Notes (Signed)
Report to Nilsa Nutting, RN

## 2017-08-30 ENCOUNTER — Encounter (HOSPITAL_COMMUNITY)
Admission: EM | Disposition: A | Discharge status: Home / Self Care | Payer: Self-pay | Source: Home / Self Care | Attending: Cardiovascular Disease

## 2017-08-30 DIAGNOSIS — I2510 Atherosclerotic heart disease of native coronary artery without angina pectoris: Secondary | ICD-10-CM

## 2017-08-30 DIAGNOSIS — I214 Non-ST elevation (NSTEMI) myocardial infarction: Secondary | ICD-10-CM

## 2017-08-30 HISTORY — PX: LEFT HEART CATH AND CORONARY ANGIOGRAPHY: CATH118249

## 2017-08-30 HISTORY — PX: CORONARY BALLOON ANGIOPLASTY: CATH118233

## 2017-08-30 LAB — GLUCOSE, CAPILLARY
GLUCOSE-CAPILLARY: 119 mg/dL — AB (ref 65–99)
GLUCOSE-CAPILLARY: 96 mg/dL (ref 65–99)
Glucose-Capillary: 133 mg/dL — ABNORMAL HIGH (ref 65–99)
Glucose-Capillary: 188 mg/dL — ABNORMAL HIGH (ref 65–99)

## 2017-08-30 LAB — LIPID PANEL
CHOL/HDL RATIO: 5.3 ratio
CHOLESTEROL: 187 mg/dL (ref 0–200)
HDL: 35 mg/dL — ABNORMAL LOW (ref >40)
LDL CALC: 125 mg/dL — AB (ref 0–99)
Triglycerides: 136 mg/dL (ref <150)
VLDL: 27 mg/dL (ref 0–40)

## 2017-08-30 LAB — CBC
HCT: 45.4 % (ref 39.0–52.0)
Hemoglobin: 15.2 g/dL (ref 13.0–17.0)
MCH: 31.9 pg (ref 26.0–34.0)
MCHC: 33.5 g/dL (ref 30.0–36.0)
MCV: 95.2 fL (ref 78.0–100.0)
PLATELETS: 187 10*3/uL (ref 150–400)
RBC: 4.77 MIL/uL (ref 4.22–5.81)
RDW: 14 % (ref 11.5–15.5)
WBC: 8 10*3/uL (ref 4.0–10.5)

## 2017-08-30 LAB — BASIC METABOLIC PANEL
Anion gap: 9 (ref 5–15)
BUN: 11 mg/dL (ref 6–20)
CHLORIDE: 101 mmol/L (ref 101–111)
CO2: 25 mmol/L (ref 22–32)
CREATININE: 1.12 mg/dL (ref 0.61–1.24)
Calcium: 9.2 mg/dL (ref 8.9–10.3)
GFR calc Af Amer: >60 mL/min (ref >60)
GFR calc non Af Amer: >60 mL/min (ref >60)
GLUCOSE: 175 mg/dL — AB (ref 65–99)
POTASSIUM: 4 mmol/L (ref 3.5–5.1)
Sodium: 135 mmol/L (ref 135–145)

## 2017-08-30 LAB — PROTIME-INR
INR: 1.11
Prothrombin Time: 14.2 seconds (ref 11.4–15.2)

## 2017-08-30 LAB — TROPONIN I
TROPONIN I: 8.89 ng/mL — AB (ref <0.03)
TROPONIN I: 6.9 ng/mL — AB (ref <0.03)
Troponin I: 0.81 ng/mL (ref <0.03)
Troponin I: 8.4 ng/mL (ref <0.03)

## 2017-08-30 LAB — MAGNESIUM: Magnesium: 1.9 mg/dL (ref 1.7–2.4)

## 2017-08-30 LAB — MRSA PCR SCREENING: MRSA BY PCR: NEGATIVE

## 2017-08-30 LAB — HEPARIN LEVEL (UNFRACTIONATED): Heparin Unfractionated: 0.88 IU/mL — ABNORMAL HIGH (ref 0.30–0.70)

## 2017-08-30 SURGERY — LEFT HEART CATH AND CORONARY ANGIOGRAPHY
Anesthesia: LOCAL

## 2017-08-30 MED ORDER — VERAPAMIL HCL 2.5 MG/ML IV SOLN
INTRAVENOUS | Status: DC | PRN
  Administered 2017-08-30: 3 mL via INTRA_ARTERIAL

## 2017-08-30 MED ORDER — IOPAMIDOL (ISOVUE-370) INJECTION 76%
INTRAVENOUS | Status: AC
  Filled 2017-08-30: qty 125

## 2017-08-30 MED ORDER — TICAGRELOR 90 MG PO TABS
ORAL_TABLET | ORAL | Status: AC
  Filled 2017-08-30: qty 2

## 2017-08-30 MED ORDER — INSULIN ASPART 100 UNIT/ML ~~LOC~~ SOLN
0.0000 [IU] | SUBCUTANEOUS | Status: DC
  Administered 2017-08-30: 2 [IU] via SUBCUTANEOUS
  Administered 2017-08-30: 3 [IU] via SUBCUTANEOUS

## 2017-08-30 MED ORDER — NITROGLYCERIN IN D5W 200-5 MCG/ML-% IV SOLN
0.0000 ug/min | INTRAVENOUS | Status: DC
  Administered 2017-08-30: 5 ug/min via INTRAVENOUS
  Filled 2017-08-30: qty 250

## 2017-08-30 MED ORDER — MORPHINE SULFATE (PF) 4 MG/ML IV SOLN
2.0000 mg | INTRAVENOUS | Status: DC | PRN
  Administered 2017-08-30: 2 mg via INTRAVENOUS
  Filled 2017-08-30 (×2): qty 1

## 2017-08-30 MED ORDER — BIVALIRUDIN TRIFLUOROACETATE 250 MG IV SOLR
INTRAVENOUS | Status: AC
  Filled 2017-08-30: qty 250

## 2017-08-30 MED ORDER — SODIUM CHLORIDE 0.9% FLUSH
3.0000 mL | Freq: Two times a day (BID) | INTRAVENOUS | Status: DC
  Administered 2017-08-30 – 2017-09-02 (×6): 3 mL via INTRAVENOUS

## 2017-08-30 MED ORDER — SODIUM CHLORIDE 0.9% FLUSH: 3.0000 mL | INTRAVENOUS | Status: DC | PRN

## 2017-08-30 MED ORDER — SODIUM CHLORIDE 0.9 % IV SOLN
INTRAVENOUS | Status: AC
  Administered 2017-08-30: 13:00:00 via INTRAVENOUS

## 2017-08-30 MED ORDER — HYDROMORPHONE HCL 1 MG/ML IJ SOLN
0.5000 mg | INTRAMUSCULAR | Status: DC | PRN
  Administered 2017-08-30: 0.5 mg via INTRAVENOUS
  Filled 2017-08-30: qty 1

## 2017-08-30 MED ORDER — VERAPAMIL HCL 2.5 MG/ML IV SOLN
INTRAVENOUS | Status: AC
  Filled 2017-08-30: qty 2

## 2017-08-30 MED ORDER — NITROGLYCERIN 1 MG/10 ML FOR IR/CATH LAB
INTRA_ARTERIAL | Status: DC | PRN
  Administered 2017-08-30: 200 ug via INTRACORONARY

## 2017-08-30 MED ORDER — HEPARIN SODIUM (PORCINE) 1000 UNIT/ML IJ SOLN
INTRAMUSCULAR | Status: DC | PRN
  Administered 2017-08-30: 5000 [IU] via INTRAVENOUS

## 2017-08-30 MED ORDER — SODIUM CHLORIDE 0.9 % IV SOLN: 250.0000 mL | INTRAVENOUS | Status: DC | PRN

## 2017-08-30 MED ORDER — HEPARIN (PORCINE) IN NACL 2-0.9 UNIT/ML-% IJ SOLN
INTRAMUSCULAR | Status: DC | PRN
  Administered 2017-08-30: 1000 mL

## 2017-08-30 MED ORDER — CLOPIDOGREL BISULFATE 75 MG PO TABS
75.0000 mg | ORAL_TABLET | Freq: Every day | ORAL | Status: DC
  Administered 2017-08-31 – 2017-09-02 (×3): 75 mg via ORAL
  Filled 2017-08-30 (×3): qty 1

## 2017-08-30 MED ORDER — HEPARIN (PORCINE) IN NACL 2-0.9 UNIT/ML-% IJ SOLN
INTRAMUSCULAR | Status: AC
  Filled 2017-08-30: qty 1000

## 2017-08-30 MED ORDER — LIDOCAINE HCL (PF) 1 % IJ SOLN
INTRAMUSCULAR | Status: DC | PRN
  Administered 2017-08-30: 2 mL via INTRADERMAL

## 2017-08-30 MED ORDER — INSULIN ASPART 100 UNIT/ML ~~LOC~~ SOLN
0.0000 [IU] | Freq: Four times a day (QID) | SUBCUTANEOUS | Status: DC
  Administered 2017-08-31 (×4): 2 [IU] via SUBCUTANEOUS

## 2017-08-30 MED ORDER — SODIUM CHLORIDE 0.9 % IV SOLN
INTRAVENOUS | Status: AC | PRN
  Administered 2017-08-30: 1.75 mg/kg/h via INTRAVENOUS

## 2017-08-30 MED ORDER — SODIUM CHLORIDE 0.9 % IV SOLN
INTRAVENOUS | Status: DC
  Administered 2017-08-30: 10:00:00 via INTRAVENOUS

## 2017-08-30 MED ORDER — HEPARIN SODIUM (PORCINE) 1000 UNIT/ML IJ SOLN
INTRAMUSCULAR | Status: AC
  Filled 2017-08-30: qty 1

## 2017-08-30 MED ORDER — NITROGLYCERIN 1 MG/10 ML FOR IR/CATH LAB
INTRA_ARTERIAL | Status: AC
  Filled 2017-08-30: qty 10

## 2017-08-30 MED ORDER — HYDROMORPHONE HCL 1 MG/ML IJ SOLN
0.5000 mg | Freq: Once | INTRAMUSCULAR | Status: AC
  Administered 2017-08-30: 0.5 mg via INTRAVENOUS
  Filled 2017-08-30: qty 0.5

## 2017-08-30 MED ORDER — IOPAMIDOL (ISOVUE-370) INJECTION 76%
INTRAVENOUS | Status: DC | PRN
  Administered 2017-08-30: 125 mL via INTRA_ARTERIAL

## 2017-08-30 MED ORDER — ACETAMINOPHEN 325 MG PO TABS
650.0000 mg | ORAL_TABLET | ORAL | Status: DC | PRN
  Administered 2017-08-30 (×3): 650 mg via ORAL
  Filled 2017-08-30 (×2): qty 2

## 2017-08-30 MED ORDER — HYDRALAZINE HCL 20 MG/ML IJ SOLN: 5.0000 mg | INTRAMUSCULAR | Status: AC | PRN

## 2017-08-30 MED ORDER — BIVALIRUDIN BOLUS VIA INFUSION - CUPID
INTRAVENOUS | Status: DC | PRN
  Administered 2017-08-30: 74.1 mg via INTRAVENOUS

## 2017-08-30 MED ORDER — LIDOCAINE HCL 1 % IJ SOLN
INTRAMUSCULAR | Status: AC
  Filled 2017-08-30: qty 20

## 2017-08-30 MED ORDER — ONDANSETRON HCL 4 MG/2ML IJ SOLN: 4.0000 mg | Freq: Four times a day (QID) | INTRAMUSCULAR | Status: DC | PRN

## 2017-08-30 MED ORDER — RAMIPRIL 5 MG PO CAPS
5.0000 mg | ORAL_CAPSULE | Freq: Every day | ORAL | Status: DC
  Administered 2017-08-31 – 2017-09-02 (×3): 5 mg via ORAL
  Filled 2017-08-30 (×3): qty 1

## 2017-08-30 MED ORDER — LABETALOL HCL 5 MG/ML IV SOLN: 10.0000 mg | INTRAVENOUS | Status: AC | PRN

## 2017-08-30 MED ORDER — SODIUM CHLORIDE 0.9 % IV SOLN
INTRAVENOUS | Status: DC | PRN
  Administered 2017-08-30: 1.75 mg/kg/h via INTRAVENOUS

## 2017-08-30 MED ORDER — TICAGRELOR 90 MG PO TABS
ORAL_TABLET | ORAL | Status: DC | PRN
  Administered 2017-08-30: 180 mg via ORAL

## 2017-08-30 MED ORDER — IOPAMIDOL (ISOVUE-300) INJECTION 61%
INTRAVENOUS | Status: AC
  Filled 2017-08-30: qty 50

## 2017-08-30 MED ORDER — RANOLAZINE ER 500 MG PO TB12
500.0000 mg | ORAL_TABLET | Freq: Every day | ORAL | Status: DC
  Administered 2017-08-30 – 2017-09-01 (×3): 500 mg via ORAL
  Filled 2017-08-30 (×3): qty 1

## 2017-08-30 MED ORDER — ASPIRIN 81 MG PO CHEW
81.0000 mg | CHEWABLE_TABLET | Freq: Every day | ORAL | Status: DC
  Administered 2017-08-31 – 2017-09-02 (×3): 81 mg via ORAL
  Filled 2017-08-30 (×3): qty 1

## 2017-08-30 MED ORDER — SODIUM CHLORIDE 0.9% FLUSH
3.0000 mL | Freq: Two times a day (BID) | INTRAVENOUS | Status: DC
  Administered 2017-08-30: 3 mL via INTRAVENOUS

## 2017-08-30 SURGICAL SUPPLY — 20 items
BALLN SAPPHIRE 2.0X12 (BALLOONS) ×2
BALLOON SAPPHIRE 2.0X12 (BALLOONS) IMPLANT
CATH INFINITI 5FR ANG PIGTAIL (CATHETERS) ×1 IMPLANT
CATH OPTITORQUE TIG 4.0 5F (CATHETERS) ×2 IMPLANT
CATH VISTA GUIDE 6FR XB3.5 (CATHETERS) ×1 IMPLANT
CATH VISTA GUIDE 6FR XB4 (CATHETERS) ×2 IMPLANT
DEVICE RAD COMP TR BAND LRG (VASCULAR PRODUCTS) ×2 IMPLANT
GLIDESHEATH SLEND A-KIT 6F 22G (SHEATH) ×1 IMPLANT
GUIDEWIRE INQWIRE 1.5J.035X260 (WIRE) IMPLANT
INQWIRE 1.5J .035X260CM (WIRE) ×2
KIT ENCORE 26 ADVANTAGE (KITS) ×2 IMPLANT
KIT HEART LEFT (KITS) ×2 IMPLANT
PACK CARDIAC CATHETERIZATION (CUSTOM PROCEDURE TRAY) ×2 IMPLANT
SYR MEDRAD MARK V 150ML (SYRINGE) ×2 IMPLANT
TRANSDUCER W/STOPCOCK (MISCELLANEOUS) ×2 IMPLANT
TUBING CIL FLEX 10 FLL-RA (TUBING) ×2 IMPLANT
WIRE ASAHI PROWATER 180CM (WIRE) ×1 IMPLANT
WIRE COUGAR XT STRL 190CM (WIRE) ×1 IMPLANT
WIRE HITORQ VERSACORE ST 145CM (WIRE) ×2 IMPLANT
WIRE RUNTHROUGH .014X180CM (WIRE) ×2 IMPLANT

## 2017-08-30 NOTE — Interval H&P Note (Signed)
Cath Lab Visit (complete for each Cath Lab visit)  Clinical Evaluation Leading to the Procedure:   ACS: Yes.    Non-ACS:    Anginal Classification: CCS III  Anti-ischemic medical therapy: Maximal Therapy (2 or more classes of medications)  Non-Invasive Test Results: No non-invasive testing performed  Prior CABG: No previous CABG      History and Physical Interval Note:  08/30/2017 10:37 AM  [name]  has presented today for surgery, with the diagnosis of NSTEMI  The various methods of treatment have been discussed with the patient and family. After consideration of risks, benefits and other options for treatment, the patient has consented to  Procedure(s): LEFT HEART CATH AND CORONARY ANGIOGRAPHY (N/A) as a surgical intervention .  The patient's history has been reviewed, patient examined, no change in status, stable for surgery.  I have reviewed the patient's chart and labs.  Questions were answered to the patient's satisfaction.     Quay Burow

## 2017-08-30 NOTE — Progress Notes (Signed)
Progress Note  Patient Name: [name] Date of Encounter: 08/30/2017  Primary Cardiologist: Dr. Gwenlyn Found  Subjective   Patient had CP early this AM around 5:30, received 2 SL NTG as well as dilaudid which relieved pain down to a zero. However, he got back up to go to the bathroom and it went to 8/10. Now when resting, lingering around a 2/10. Next troponin this AM was 8.  Inpatient Medications    Scheduled Meds: . aspirin  324 mg Oral Once  . aspirin  81 mg Oral Daily  . clopidogrel  75 mg Oral Daily  . insulin aspart  0-15 Units Subcutaneous Q4H  . isosorbide mononitrate  60 mg Oral Daily  . loratadine  10 mg Oral Daily  . metoprolol succinate  12.5 mg Oral Daily  . pantoprazole  40 mg Oral Daily  . [START ON 08/31/2017] ramipril  5 mg Oral Daily  . ranolazine  500 mg Oral Daily   Continuous Infusions: . heparin 1,250 Units/hr (08/30/17 0333)   PRN Meds: acetaminophen, cyclobenzaprine, nitroGLYCERIN   Vital Signs    Vitals:   08/29/17 2010 08/29/17 2040 08/30/17 0300 08/30/17 0420  BP: 139/81 (!) 148/85    Pulse: 84 83 68 87  Resp:      Temp: 98.1 F (36.7 C) 97.9 F (36.6 C)    TempSrc: Oral Oral    SpO2: 94% 96%    Weight:   217 lb 12.8 oz (98.8 kg) 217 lb 12.8 oz (98.8 kg)  Height:        Intake/Output Summary (Last 24 hours) at 08/30/2017 0916 Last data filed at 08/30/2017 0342 Gross per 24 hour  Intake 241.88 ml  Output -  Net 241.88 ml   Filed Weights   08/29/17 1246 08/30/17 0300 08/30/17 0420  Weight: 210 lb (95.3 kg) 217 lb 12.8 oz (98.8 kg) 217 lb 12.8 oz (98.8 kg)    Telemetry    NSR, short run of irregular AIVR (3:54am) - Personally Reviewed  ECG    NSR early upsloping I, avL, V2-V3 similar to prior. No acute changes. - Personally Reviewed  Physical Exam   GEN: No acute distress.  HEENT: Normocephalic, atraumatic, sclera non-icteric. Neck: No JVD or bruits. Cardiac: RRR no murmurs, rubs, or gallops.  Radials/DP/PT 1+ and equal  bilaterally.  Respiratory: Clear to auscultation bilaterally. Breathing is unlabored. GI: Soft, nontender, non-distended, BS +x 4. MS: no deformity. Extremities: No clubbing or cyanosis. No edema. Distal pedal pulses are 2+ and equal bilaterally. Neuro:  AAOx3. Follows commands. Psych:  Responds to questions appropriately with a normal affect.  Labs    Chemistry Recent Labs  Lab 08/29/17 1249 08/29/17 2012 08/30/17 0315  NA 136 135 135  K 4.1 3.8 4.0  CL 103 101 101  CO2 23 23 25   GLUCOSE 171* 158* 175*  BUN 15 12 11   CREATININE 1.19 1.23 1.12  CALCIUM 9.6 9.1 9.2  PROT 7.5 7.2  --   ALBUMIN 4.0 3.9  --   AST 22 37  --   ALT 13* 15*  --   ALKPHOS 43 44  --   BILITOT 1.6* 1.8*  --   GFRNONAA >60 58* >60  GFRAA >60 >60 >60  ANIONGAP 10 11 9      Hematology Recent Labs  Lab 08/29/17 1249 08/29/17 2012 08/30/17 0315  WBC 7.1 9.5 8.0  RBC 4.93 4.79 4.77  HGB 15.1 15.4 15.2  HCT 46.3 45.2 45.4  MCV 93.9 94.4  95.2  MCH 30.6 32.2 31.9  MCHC 32.6 34.1 33.5  RDW 13.7 13.5 14.0  PLT 176 170 187    Cardiac Enzymes Recent Labs  Lab 08/29/17 1249 08/29/17 2012 08/30/17 0758  TROPONINI <0.03 0.81* 8.89*   No results for input(s): TROPIPOC in the last 168 hours.   BNP Recent Labs  Lab 08/29/17 1250  BNP 18.0     DDimer No results for input(s): DDIMER in the last 168 hours.   Radiology    Dg Chest 2 View  Result Date: 08/29/2017 CLINICAL DATA:  Chest pain EXAM: CHEST  2 VIEW COMPARISON:  November 24, 2016 FINDINGS: There is no edema or consolidation. Heart size and pulmonary vascularity are normal. No adenopathy. There is aortic atherosclerosis. There is a small metallic foreign body in the anterior right hemithorax, stable. No pneumothorax. There is degenerative change in the thoracic spine. IMPRESSION: No edema or consolidation.  Aortic atherosclerosis. Aortic Atherosclerosis (ICD10-I70.0). Electronically Signed   By: Lowella Grip III M.D.   On: 08/29/2017  13:37    Cardiac Studies   pending  Patient Profile     72 y.o. male with CAD s/p muliple PCI to OM1, PVD s/p PCI (SFA, PTA), CKD II, HTN, DM2, GERD, who was [facility] with NSTEMI. Prior history of PCI to [place] with subsequent restenosis for which he has had repeat balloon angioplasty and subsequent PCI. Last cath 2012. Low risk nuc 06/2017 with  small inferior defect that was felt to be due to tissue attenuation.   Assessment & Plan    1. CAD/NSTEMI - troponins uptrending with recurrent pain this AM. Continue DAPT and heparin. Pt reports prior bradycardia with higher doses of metoprolol in the past, so continue current dose. Intolerant of statins/Zetia so will need OP f/u with pharm clinic to discuss [facility]-9. Reviewed urgently with Dr. Haroldine Laws - given persistent pain this AM and uptrending troponin, will call in cath team to perform cath. Risks and benefits of cardiac catheterization have been discussed with the patient.  These include bleeding, infection, kidney damage, stroke, heart attack, death.  The patient understands these risks and is willing to proceed.  2. HTN - will discuss regimen with MD. May need additional agent post-cath (I.e. Amlodipine).   3. CKD stage II - Cr at baseline. Follow.  4. PVD - continue DAPT.  5. DM2 - oral regimen on hold, continue SSI.  6. Hyperlipidemia - intolerant of statins per Dr. Kennon Holter note. Not cleared if ever tried on Zetia. Needs pharmacy follow-up as OP to consider PCSK-9 and further explore which statins/doses he's trialed.  7. AIVR - K is OK. Will update Mg. Continue tele. Plan cath.  For questions or updates, please contact [place] Please consult [email or web address] for contact info under Cardiology/STEMI.  Signed, Charlie Pitter, PA-C 08/30/2017, 9:16 AM    Patient seen and examined with Melina Copa, PA-C. We discussed all aspects of the encounter. I agree with the assessment and plan as stated above.   Continues with ongoing  CP worse with any activity. Troponin rising. ECG non-acute but may be LCX occlusion. On heparin, DAPT. Will need cath today. I have discussed with him and cath team and bedside nurse. Will plan cath this am. Keep NPO.  Glori Bickers, MD  9:47 AM

## 2017-08-30 NOTE — Progress Notes (Signed)
Patient reports 0/10 pain after receiving Dilaudid and IV Nitro.

## 2017-08-30 NOTE — Progress Notes (Signed)
CRITICAL VALUE ALERT  Critical Value:  Troponin 8.89  Date & Time Notied:  08/30/2017 0903  Provider Notified: Called PA Dunn  Orders Received/Actions taken: No action taken at this time

## 2017-08-30 NOTE — Progress Notes (Signed)
Patient began to complain of 8/10 chest pain in mid epigastric area, describes it as burning or cramping. Patient already received Tylenol 650 mg @ 1700, Morphine 2 mg IV @ 1609 without any relief. 12 Lead EKG performed, results NSR. EKG compared to previous EKG, no significant changes. Provider on call paged (Dr. Justice Britain) and she asked me to page Dr. Gwenlyn Found since he cathed the patient today and knew his anatomy. Dr. Gwenlyn Found paged and ordered Nitroglycerin IV and Dilaudid 0.5 mg IV Q 4 hours for chest pain. Patient stated the only thing that has been relieving his chest pain is the Dilaudid. Will start medications as ordered and continue to monitor closely.

## 2017-08-30 NOTE — H&P (View-Only) (Signed)
Progress Note  Patient Name: [name] Date of Encounter: 08/30/2017  Primary Cardiologist: Dr. Gwenlyn Found  Subjective   Patient had CP early this AM around 5:30, received 2 SL NTG as well as dilaudid which relieved pain down to a zero. However, he got back up to go to the bathroom and it went to 8/10. Now when resting, lingering around a 2/10. Next troponin this AM was 8.  Inpatient Medications    Scheduled Meds: . aspirin  324 mg Oral Once  . aspirin  81 mg Oral Daily  . clopidogrel  75 mg Oral Daily  . insulin aspart  0-15 Units Subcutaneous Q4H  . isosorbide mononitrate  60 mg Oral Daily  . loratadine  10 mg Oral Daily  . metoprolol succinate  12.5 mg Oral Daily  . pantoprazole  40 mg Oral Daily  . [START ON 08/31/2017] ramipril  5 mg Oral Daily  . ranolazine  500 mg Oral Daily   Continuous Infusions: . heparin 1,250 Units/hr (08/30/17 0333)   PRN Meds: acetaminophen, cyclobenzaprine, nitroGLYCERIN   Vital Signs    Vitals:   08/29/17 2010 08/29/17 2040 08/30/17 0300 08/30/17 0420  BP: 139/81 (!) 148/85    Pulse: 84 83 68 87  Resp:      Temp: 98.1 F (36.7 C) 97.9 F (36.6 C)    TempSrc: Oral Oral    SpO2: 94% 96%    Weight:   217 lb 12.8 oz (98.8 kg) 217 lb 12.8 oz (98.8 kg)  Height:        Intake/Output Summary (Last 24 hours) at 08/30/2017 0916 Last data filed at 08/30/2017 0342 Gross per 24 hour  Intake 241.88 ml  Output -  Net 241.88 ml   Filed Weights   08/29/17 1246 08/30/17 0300 08/30/17 0420  Weight: 210 lb (95.3 kg) 217 lb 12.8 oz (98.8 kg) 217 lb 12.8 oz (98.8 kg)    Telemetry    NSR, short run of irregular AIVR (3:54am) - Personally Reviewed  ECG    NSR early upsloping I, avL, V2-V3 similar to prior. No acute changes. - Personally Reviewed  Physical Exam   GEN: No acute distress.  HEENT: Normocephalic, atraumatic, sclera non-icteric. Neck: No JVD or bruits. Cardiac: RRR no murmurs, rubs, or gallops.  Radials/DP/PT 1+ and equal  bilaterally.  Respiratory: Clear to auscultation bilaterally. Breathing is unlabored. GI: Soft, nontender, non-distended, BS +x 4. MS: no deformity. Extremities: No clubbing or cyanosis. No edema. Distal pedal pulses are 2+ and equal bilaterally. Neuro:  AAOx3. Follows commands. Psych:  Responds to questions appropriately with a normal affect.  Labs    Chemistry Recent Labs  Lab 08/29/17 1249 08/29/17 2012 08/30/17 0315  NA 136 135 135  K 4.1 3.8 4.0  CL 103 101 101  CO2 23 23 25   GLUCOSE 171* 158* 175*  BUN 15 12 11   CREATININE 1.19 1.23 1.12  CALCIUM 9.6 9.1 9.2  PROT 7.5 7.2  --   ALBUMIN 4.0 3.9  --   AST 22 37  --   ALT 13* 15*  --   ALKPHOS 43 44  --   BILITOT 1.6* 1.8*  --   GFRNONAA >60 58* >60  GFRAA >60 >60 >60  ANIONGAP 10 11 9      Hematology Recent Labs  Lab 08/29/17 1249 08/29/17 2012 08/30/17 0315  WBC 7.1 9.5 8.0  RBC 4.93 4.79 4.77  HGB 15.1 15.4 15.2  HCT 46.3 45.2 45.4  MCV 93.9 94.4  95.2  MCH 30.6 32.2 31.9  MCHC 32.6 34.1 33.5  RDW 13.7 13.5 14.0  PLT 176 170 187    Cardiac Enzymes Recent Labs  Lab 08/29/17 1249 08/29/17 2012 08/30/17 0758  TROPONINI <0.03 0.81* 8.89*   No results for input(s): TROPIPOC in the last 168 hours.   BNP Recent Labs  Lab 08/29/17 1250  BNP 18.0     DDimer No results for input(s): DDIMER in the last 168 hours.   Radiology    Dg Chest 2 View  Result Date: 08/29/2017 CLINICAL DATA:  Chest pain EXAM: CHEST  2 VIEW COMPARISON:  November 24, 2016 FINDINGS: There is no edema or consolidation. Heart size and pulmonary vascularity are normal. No adenopathy. There is aortic atherosclerosis. There is a small metallic foreign body in the anterior right hemithorax, stable. No pneumothorax. There is degenerative change in the thoracic spine. IMPRESSION: No edema or consolidation.  Aortic atherosclerosis. Aortic Atherosclerosis (ICD10-I70.0). Electronically Signed   By: Lowella Grip III M.D.   On: 08/29/2017  13:37    Cardiac Studies   pending  Patient Profile     72 y.o. male with CAD s/p muliple PCI to OM1, PVD s/p PCI (SFA, PTA), CKD II, HTN, DM2, GERD, who was txferred from [facility] with NSTEMI. Prior history of PCI to [place] with subsequent restenosis for which he has had repeat balloon angioplasty and subsequent PCI. Last cath 2012. Low risk nuc 06/2017 with  small inferior defect that was felt to be due to tissue attenuation.   Assessment & Plan    1. CAD/NSTEMI - troponins uptrending with recurrent pain this AM. Continue DAPT and heparin. Pt reports prior bradycardia with higher doses of metoprolol in the past, so continue current dose. Intolerant of statins/Zetia so will need OP f/u with pharm clinic to discuss PCSK-9. Reviewed urgently with Dr. Haroldine Laws - given persistent pain this AM and uptrending troponin, will call in cath team to perform cath. Risks and benefits of cardiac catheterization have been discussed with the patient.  These include bleeding, infection, kidney damage, stroke, heart attack, death.  The patient understands these risks and is willing to proceed.  2. HTN - will discuss regimen with MD. May need additional agent post-cath (I.e. Amlodipine).   3. CKD stage II - Cr at baseline. Follow.  4. PVD - continue DAPT.  5. DM2 - oral regimen on hold, continue SSI.  6. Hyperlipidemia - intolerant of statins per Dr. Kennon Holter note. Not cleared if ever tried on Zetia. Needs pharmacy follow-up as OP to consider PCSK-9 and further explore which statins/doses he's trialed.  7. AIVR - K is OK. Will update Mg. Continue tele. Plan cath.  For questions or updates, please contact [place] Please consult [email or web address] for contact info under Cardiology/STEMI.  Signed, Charlie Pitter, PA-C 08/30/2017, 9:16 AM    Patient seen and examined with Melina Copa, PA-C. We discussed all aspects of the encounter. I agree with the assessment and plan as stated above.   Continues with ongoing  CP worse with any activity. Troponin rising. ECG non-acute but may be LCX occlusion. On heparin, DAPT. Will need cath today. I have discussed with him and cath team and bedside nurse. Will plan cath this am. Keep NPO.  Glori Bickers, MD  9:47 AM

## 2017-08-30 NOTE — Progress Notes (Signed)
Spoke to PA Dunn regarding patient Troponin critical value of 8.89. PA Dunn will have MD come and see patient. Patient is lying in bed with stated pain level of 2. Patient is calm alert and oriented. Will keep patient NPO until otherwise notified by physician.

## 2017-08-31 ENCOUNTER — Other Ambulatory Visit (HOSPITAL_COMMUNITY): Payer: Self-pay

## 2017-08-31 ENCOUNTER — Encounter (HOSPITAL_COMMUNITY): Payer: Self-pay | Admitting: Cardiovascular Disease

## 2017-08-31 DIAGNOSIS — E0829 Diabetes mellitus due to underlying condition with other diabetic kidney complication: Secondary | ICD-10-CM

## 2017-08-31 DIAGNOSIS — E785 Hyperlipidemia, unspecified: Secondary | ICD-10-CM

## 2017-08-31 DIAGNOSIS — Z794 Long term (current) use of insulin: Secondary | ICD-10-CM

## 2017-08-31 LAB — GLUCOSE, CAPILLARY
GLUCOSE-CAPILLARY: 178 mg/dL — AB (ref 65–99)
GLUCOSE-CAPILLARY: 138 mg/dL — AB (ref 65–99)
GLUCOSE-CAPILLARY: 187 mg/dL — AB (ref 65–99)
Glucose-Capillary: 140 mg/dL — ABNORMAL HIGH (ref 65–99)
Glucose-Capillary: 144 mg/dL — ABNORMAL HIGH (ref 65–99)

## 2017-08-31 LAB — BASIC METABOLIC PANEL
ANION GAP: 11 (ref 5–15)
BUN: 9 mg/dL (ref 6–20)
CALCIUM: 9.2 mg/dL (ref 8.9–10.3)
CO2: 22 mmol/L (ref 22–32)
Chloride: 104 mmol/L (ref 101–111)
Creatinine, Ser: 1.18 mg/dL (ref 0.61–1.24)
GFR calc Af Amer: >60 mL/min (ref >60)
GLUCOSE: 148 mg/dL — AB (ref 65–99)
Potassium: 3.9 mmol/L (ref 3.5–5.1)
Sodium: 137 mmol/L (ref 135–145)

## 2017-08-31 LAB — POCT ACTIVATED CLOTTING TIME: Activated Clotting Time: 510 seconds

## 2017-08-31 LAB — CBC
HEMATOCRIT: 44.6 % (ref 39.0–52.0)
HEMOGLOBIN: 14.9 g/dL (ref 13.0–17.0)
MCH: 31.8 pg (ref 26.0–34.0)
MCHC: 33.4 g/dL (ref 30.0–36.0)
MCV: 95.1 fL (ref 78.0–100.0)
Platelets: 175 10*3/uL (ref 150–400)
RBC: 4.69 MIL/uL (ref 4.22–5.81)
RDW: 13.7 % (ref 11.5–15.5)
WBC: 12.1 10*3/uL — ABNORMAL HIGH (ref 4.0–10.5)

## 2017-08-31 MED ORDER — ISOSORBIDE MONONITRATE ER 60 MG PO TB24
120.0000 mg | ORAL_TABLET | Freq: Every day | ORAL | Status: DC
  Administered 2017-09-01: 120 mg via ORAL
  Filled 2017-08-31: qty 2

## 2017-08-31 MED ORDER — METOPROLOL SUCCINATE ER 25 MG PO TB24
25.0000 mg | ORAL_TABLET | Freq: Every day | ORAL | Status: DC
  Administered 2017-09-01 – 2017-09-02 (×2): 25 mg via ORAL
  Filled 2017-08-31 (×3): qty 1

## 2017-08-31 NOTE — Progress Notes (Signed)
CARDIAC REHAB PHASE I   PRE:  Rate/Rhythm: 106 ST  BP:  Supine:   Sitting: 134/79  Standing:    SaO2: 95%RA  MODE:  Ambulation: 630 ft   POST:  Rate/Rhythm: 110 ST  BP:  Supine:   Sitting: 123/73  Standing:    SaO2: 100%RA 1120-1210 Pt walked 630 ft on RA with steady gait. Tolerated well with no CP. MI education completed with pt who voiced understanding. Reviewed plavix, NTG use, MI restrictions, ex ed and carb counting and heart healthy food choices. Pt has attended CRP 2 [place] before. Will refer back to [place].    Graylon Good, RN BSN  08/31/2017 12:05 PM

## 2017-08-31 NOTE — Progress Notes (Signed)
Progress Note  Patient Name: [name] Date of Encounter: 08/31/2017  Primary Cardiologist: Gwenlyn Found  Subjective   Presented with recurring chest discomfort described as a burning, sharp, substernal discomfort precipitated by heavier than usual physical activity.  Over the past 4 days the patient has attempted to intensify his walking program and notes the discomfort at higher workloads.  Because of the discomfort he came to the emergency room and was admitted to the hospital with his known prior history of CAD.  Inpatient Medications    Scheduled Meds: . aspirin  81 mg Oral Daily  . clopidogrel  75 mg Oral Q breakfast  . insulin aspart  0-15 Units Subcutaneous QID  . isosorbide mononitrate  60 mg Oral Daily  . loratadine  10 mg Oral Daily  . metoprolol succinate  12.5 mg Oral Daily  . pantoprazole  40 mg Oral Daily  . ramipril  5 mg Oral Daily  . ranolazine  500 mg Oral Daily  . sodium chloride flush  3 mL Intravenous Q12H   Continuous Infusions: . sodium chloride    . nitroGLYCERIN Stopped (08/31/17 0900)   PRN Meds: sodium chloride, acetaminophen, cyclobenzaprine, HYDROmorphone (DILAUDID) injection, morphine injection, nitroGLYCERIN, ondansetron (ZOFRAN) IV, sodium chloride flush   Vital Signs    Vitals:   08/31/17 0630 08/31/17 0645 08/31/17 0700 08/31/17 0800  BP: (!) 149/73 (!) 144/77 138/83 (!) 153/79  Pulse: (!) 105 97 95   Resp:      Temp:    99.3 F (37.4 C)  TempSrc:    Oral  SpO2: 94% 96% 93%   Weight:      Height:        Intake/Output Summary (Last 24 hours) at 08/31/2017 1000 Last data filed at 08/31/2017 0900 Gross per 24 hour  Intake 2377.41 ml  Output 1400 ml  Net 977.41 ml   Filed Weights   08/29/17 1246 08/30/17 0300 08/30/17 0420  Weight: 210 lb (95.3 kg) 217 lb 12.8 oz (98.8 kg) 217 lb 12.8 oz (98.8 kg)    Telemetry    NSR - Personally Reviewed  ECG    Performed 08/31/2017 demonstrated sinus tachycardia with no acute ST-T wave  change.  Compared to the admitting tracing, heart rate is faster.- Personally Reviewed  Physical Exam  Moderately obese African-American male in no acute distress. GEN: No acute distress.   Neck: No JVD Cardiac: RRR, no murmurs, rubs, or gallops.  Respiratory: Clear to auscultation bilaterally. GI: Soft, nontender, non-distended  MS: No edema; No deformity. Neuro:  Nonfocal  Psych: Normal affect   Labs    Chemistry Recent Labs  Lab 08/29/17 1249 08/29/17 2012 08/30/17 0315 08/31/17 0237  NA 136 135 135 137  K 4.1 3.8 4.0 3.9  CL 103 101 101 104  CO2 23 23 25 22   GLUCOSE 171* 158* 175* 148*  BUN 15 12 11 9   CREATININE 1.19 1.23 1.12 1.18  CALCIUM 9.6 9.1 9.2 9.2  PROT 7.5 7.2  --   --   ALBUMIN 4.0 3.9  --   --   AST 22 37  --   --   ALT 13* 15*  --   --   ALKPHOS 43 44  --   --   BILITOT 1.6* 1.8*  --   --   GFRNONAA >60 58* >60 >60  GFRAA >60 >60 >60 >60  ANIONGAP 10 11 9 11      Hematology Recent Labs  Lab 08/29/17 2012 08/30/17 0315 08/31/17  0237  WBC 9.5 8.0 12.1*  RBC 4.79 4.77 4.69  HGB 15.4 15.2 14.9  HCT 45.2 45.4 44.6  MCV 94.4 95.2 95.1  MCH 32.2 31.9 31.8  MCHC 34.1 33.5 33.4  RDW 13.5 14.0 13.7  PLT 170 187 175    Cardiac Enzymes Recent Labs  Lab 08/29/17 2012 08/30/17 0758 08/30/17 1004 08/30/17 1601  TROPONINI 0.81* 8.89* 8.40* 6.90*   No results for input(s): TROPIPOC in the last 168 hours.   BNP Recent Labs  Lab 08/29/17 1250  BNP 18.0     DDimer No results for input(s): DDIMER in the last 168 hours.   Radiology    Dg Chest 2 View  Result Date: 08/29/2017 CLINICAL DATA:  Chest pain EXAM: CHEST  2 VIEW COMPARISON:  November 24, 2016 FINDINGS: There is no edema or consolidation. Heart size and pulmonary vascularity are normal. No adenopathy. There is aortic atherosclerosis. There is a small metallic foreign body in the anterior right hemithorax, stable. No pneumothorax. There is degenerative change in the thoracic spine.  IMPRESSION: No edema or consolidation.  Aortic atherosclerosis. Aortic Atherosclerosis (ICD10-I70.0). Electronically Signed   By: Lowella Grip III M.D.   On: 08/29/2017 13:37    Cardiac Studies   Coronary Diagrams   Diagnostic Diagram       Post-Intervention Diagram        Failed attempt to recanalize the 100% obtuse marginal in-stent restenosis.  Patient Profile     72 y.o. male with a history of CAD s/p muliple PCI to OM1, PVD s/p PCI (SFA, PTA), HTN, DM2, GERD, who presents with chest pain.  Cardiac cath demonstrated total occlusion of the stent to the first obtuse marginal, initially recanalized, recurrent occlusion in the Cath Lab, with inability to reestablish flow.   Assessment & Plan    1.  Acute non-ST elevation lateral wall infarction due to occlusion of the stent to the obtuse marginal.  Plan medical therapy with optimization of beta-blocker and long-acting nitrate therapy.  Phase 1 cardiac rehab.  Potentially a candidate for discharge in 24-48 hours depending upon hospital course. 2.  Diabetes mellitus, type II 3.  PAD with previous superficial femoral artery PTCA 4.  Type 2 diabetes mellitus, with hemoglobin A1c pending. 5.  Essential hypertension with target blood pressure control 130/85 mmHg or less. 6.  Hyperlipidemia with LDL target less than 70.  Admitting LDL was 120.  Has had side effects on statins.  Will begin statin therapy currently and attempt switch over to [place] therapy as OP.    For questions or updates, please contact [place] Please consult [email or web address] for contact info under Cardiology/STEMI.      Signed, Sinclair Grooms, MD  08/31/2017, 10:00 AM

## 2017-09-01 ENCOUNTER — Inpatient Hospital Stay (HOSPITAL_COMMUNITY): Payer: PPO

## 2017-09-01 DIAGNOSIS — E0821 Diabetes mellitus due to underlying condition with diabetic nephropathy: Secondary | ICD-10-CM

## 2017-09-01 DIAGNOSIS — I2510 Atherosclerotic heart disease of native coronary artery without angina pectoris: Secondary | ICD-10-CM

## 2017-09-01 HISTORY — PX: TRANSTHORACIC ECHOCARDIOGRAM: SHX275

## 2017-09-01 LAB — ECHOCARDIOGRAM COMPLETE
HEIGHTINCHES: 72 in
Weight: 3484.8 oz

## 2017-09-01 LAB — GLUCOSE, CAPILLARY
GLUCOSE-CAPILLARY: 164 mg/dL — AB (ref 65–99)
Glucose-Capillary: 173 mg/dL — ABNORMAL HIGH (ref 65–99)

## 2017-09-01 LAB — HEMOGLOBIN A1C
Hgb A1c MFr Bld: 6.6 % — ABNORMAL HIGH (ref 4.8–5.6)
Mean Plasma Glucose: 142.72 mg/dL

## 2017-09-01 MED ORDER — ISOSORBIDE MONONITRATE ER 60 MG PO TB24
60.0000 mg | ORAL_TABLET | Freq: Every day | ORAL | Status: DC
  Administered 2017-09-02: 60 mg via ORAL
  Filled 2017-09-01: qty 1

## 2017-09-01 MED ORDER — ROSUVASTATIN CALCIUM 20 MG PO TABS
20.0000 mg | ORAL_TABLET | Freq: Every day | ORAL | Status: DC
  Administered 2017-09-01: 20 mg via ORAL
  Filled 2017-09-01: qty 1

## 2017-09-01 MED ORDER — INSULIN ASPART 100 UNIT/ML ~~LOC~~ SOLN
0.0000 [IU] | Freq: Three times a day (TID) | SUBCUTANEOUS | Status: DC
  Administered 2017-09-01 (×4): 3 [IU] via SUBCUTANEOUS
  Administered 2017-09-02: 2 [IU] via SUBCUTANEOUS
  Administered 2017-09-02: 3 [IU] via SUBCUTANEOUS

## 2017-09-01 NOTE — Progress Notes (Signed)
First attempt to call report. Secretary asked if RN could call back in 10 minutes.

## 2017-09-01 NOTE — Plan of Care (Signed)
Pt continues to be able to turn himself while in bed and ambulated with PT during day shift.

## 2017-09-01 NOTE — Progress Notes (Signed)
  Echocardiogram 2D Echocardiogram has been performed.  [name] 09/01/2017, 8:54 AM

## 2017-09-01 NOTE — Progress Notes (Signed)
Progress Note  Patient Name: [name] Date of Encounter: 09/01/2017  Primary Cardiologist: Gwenlyn Found  Subjective   He was not able to get very much sleep last evening.  Coronary angiography from admission and there was failed attempt at angioplasty of the first obtuse marginal which had been previously stented.  The patient did have a small infarct related to occlusion.  Inpatient Medications    Scheduled Meds: . aspirin  81 mg Oral Daily  . clopidogrel  75 mg Oral Q breakfast  . insulin aspart  0-15 Units Subcutaneous TID AC & HS  . isosorbide mononitrate  120 mg Oral Daily  . loratadine  10 mg Oral Daily  . metoprolol succinate  25 mg Oral Daily  . pantoprazole  40 mg Oral Daily  . ramipril  5 mg Oral Daily  . ranolazine  500 mg Oral Daily  . sodium chloride flush  3 mL Intravenous Q12H   Continuous Infusions: . sodium chloride     PRN Meds: sodium chloride, acetaminophen, cyclobenzaprine, HYDROmorphone (DILAUDID) injection, morphine injection, nitroGLYCERIN, ondansetron (ZOFRAN) IV, sodium chloride flush   Vital Signs    Vitals:   08/31/17 2350 09/01/17 0300 09/01/17 0356 09/01/17 0700  BP: (!) 146/79  118/77   Pulse:   (!) 108   Resp:      Temp: (!) 100.4 F (38 C) 99.1 F (37.3 C)  98.5 F (36.9 C)  TempSrc: Oral Oral  Oral  SpO2: 95%  91%   Weight:      Height:        Intake/Output Summary (Last 24 hours) at 09/01/2017 1106 Last data filed at 09/01/2017 0500 Gross per 24 hour  Intake 480 ml  Output 850 ml  Net -370 ml   Filed Weights   08/29/17 1246 08/30/17 0300 08/30/17 0420  Weight: 210 lb (95.3 kg) 217 lb 12.8 oz (98.8 kg) 217 lb 12.8 oz (98.8 kg)    Telemetry    NSR - Personally Reviewed  ECG    Performed 08/31/2017 demonstrated sinus tachycardia with no acute ST-T wave change.  Compared to the admitting tracing, heart rate is faster.- Personally Reviewed  Physical Exam  Feels well, sitting up eating breakfast. GEN: No acute distress.   Low-grade temperature Neck: No JVD Cardiac: Tachycardic  with no pericardial rub or gallop audible.  Radial access site is unremarkable. Respiratory: Clear to auscultation bilaterally. GI: Soft, nontender, non-distended  MS: No edema; No deformity. Neuro:  Nonfocal  Psych: Normal affect   Labs    Chemistry Recent Labs  Lab 08/29/17 1249 08/29/17 2012 08/30/17 0315 08/31/17 0237  NA 136 135 135 137  K 4.1 3.8 4.0 3.9  CL 103 101 101 104  CO2 23 23 25 22   GLUCOSE 171* 158* 175* 148*  BUN 15 12 11 9   CREATININE 1.19 1.23 1.12 1.18  CALCIUM 9.6 9.1 9.2 9.2  PROT 7.5 7.2  --   --   ALBUMIN 4.0 3.9  --   --   AST 22 37  --   --   ALT 13* 15*  --   --   ALKPHOS 43 44  --   --   BILITOT 1.6* 1.8*  --   --   GFRNONAA >60 58* >60 >60  GFRAA >60 >60 >60 >60  ANIONGAP 10 11 9 11      Hematology Recent Labs  Lab 08/29/17 2012 08/30/17 0315 08/31/17 0237  WBC 9.5 8.0 12.1*  RBC 4.79 4.77 4.69  HGB 15.4 15.2 14.9  HCT 45.2 45.4 44.6  MCV 94.4 95.2 95.1  MCH 32.2 31.9 31.8  MCHC 34.1 33.5 33.4  RDW 13.5 14.0 13.7  PLT 170 187 175    Cardiac Enzymes Recent Labs  Lab 08/29/17 2012 08/30/17 0758 08/30/17 1004 08/30/17 1601  TROPONINI 0.81* 8.89* 8.40* 6.90*   No results for input(s): TROPIPOC in the last 168 hours.   BNP Recent Labs  Lab 08/29/17 1250  BNP 18.0     DDimer No results for input(s): DDIMER in the last 168 hours.   Radiology    No results found.  Cardiac Studies   Coronary Diagrams   Diagnostic Diagram       Post-Intervention Diagram        Failed attempt to recanalize the 100% obtuse marginal in-stent restenosis.  2D Doppler echocardiogram 09/01/17: ------------------------------------------------------------------- Study Conclusions   - Left ventricle: The cavity size was normal. There was mild   concentric hypertrophy. Systolic function was normal. The   estimated ejection fraction was in the range of 60% to 65%. Wall    motion was normal; there were no regional wall motion   abnormalities. There was an increased relative contribution of   atrial contraction to ventricular filling. Doppler parameters are   consistent with abnormal left ventricular relaxation (grade 1   diastolic dysfunction). - Aortic valve: Mildly calcified annulus. Trileaflet; mildly   thickened, mildly calcified leaflets. - Aorta: Aortic root dimension: 40 mm (ED). - Aortic root: The aortic root was mildly dilated. - Pulmonary arteries: Systolic pressure could not be accurately   estimated.   Patient Profile     72 y.o. male with a history of CAD s/p muliple PCI to OM1, PVD s/p PCI (SFA, PTA), HTN, DM2, GERD, who presents with chest pain.  Cardiac cath demonstrated total occlusion of the stent to the first obtuse marginal, initially recanalized, recurrent occlusion in the Cath Lab, with inability to reestablish flow.   Assessment & Plan    1.  Acute non-ST elevation lateral wall infarction: Phase 1 cardiac rehab.  Further increase beta-blocker dose.  Ambulate.  Transfer to telemetry.  Plan discharge in a.m. 2.  Diabetes mellitus, type II: Hemoglobin A1c is 6.6.  Diabetes control is under reasonable care. 3.  PAD asymptomatic during this hospital stay 4.  Essential hypertension: 130/85 mmHg or less.  I will decreased isosorbide dose and up titrate the beta-blocker dose. 5.  Hyperlipidemia: LDL is 125.  Will need attention as outpatient.  Will resume statin therapy short-term.  Transfer to telemetry.  Target discharge tomorrow.   For questions or updates, please contact [place] Please consult [email or web address] for contact info under Cardiology/STEMI.      Signed, Sinclair Grooms, MD  09/01/2017, 11:06 AM

## 2017-09-01 NOTE — Progress Notes (Signed)
CARDIAC REHAB PHASE I   PRE:  Rate/Rhythm: 97 SR  BP:  Supine:   Sitting: 104/72  Standing:    SaO2: 96%RA  MODE:  Ambulation: 740 ft   POST:  Rate/Rhythm: 106 ST  BP:  Supine:   Sitting: 125/67  Standing:    SaO2: 96%RA 1035-1052 Pt walked 740 ft on RA with steady gait and tolerated well. Heart rate to 106. No CP. No questions re ed done yesterday.   Graylon Good, RN BSN  09/01/2017 10:48 AM

## 2017-09-01 NOTE — Progress Notes (Signed)
Report called to [employer]. Patient transferring to [place] [facility].

## 2017-09-01 NOTE — Care Management Note (Signed)
Case Management Note  Patient Details  Name: [name] MRN: [id number] Date of Birth: 07/07/1946  Subjective/Objective:  From home, presents with NSTEMI, s/p coronary balloon angioplasty,will be on plavix.                    Action/Plan: NCM will follow for dc needs.  Expected Discharge Date:                  Expected Discharge Plan:  Home/Self Care  In-House Referral:     Discharge planning Services  CM Consult  Post Acute Care Choice:    Choice offered to:     DME Arranged:    DME Agency:     HH Arranged:    HH Agency:     Status of Service:  In process, will continue to follow  If discussed at Long Length of Stay Meetings, dates discussed:    Additional Comments:  Zenon Mayo, RN 09/01/2017, 10:21 AM

## 2017-09-02 ENCOUNTER — Encounter (HOSPITAL_COMMUNITY): Payer: Self-pay | Admitting: Cardiology

## 2017-09-02 DIAGNOSIS — Z789 Other specified health status: Secondary | ICD-10-CM

## 2017-09-02 LAB — GLUCOSE, CAPILLARY
GLUCOSE-CAPILLARY: 131 mg/dL — AB (ref 65–99)
Glucose-Capillary: 186 mg/dL — ABNORMAL HIGH (ref 65–99)

## 2017-09-02 MED ORDER — METOPROLOL SUCCINATE ER 25 MG PO TB24: 50.0000 mg | ORAL_TABLET | Freq: Every day | ORAL | 3 refills | Status: DC

## 2017-09-02 MED ORDER — ISOSORBIDE MONONITRATE ER 30 MG PO TB24: 30.0000 mg | ORAL_TABLET | Freq: Every day | ORAL | Status: DC

## 2017-09-02 MED ORDER — ROSUVASTATIN CALCIUM 20 MG PO TABS: 20.0000 mg | ORAL_TABLET | Freq: Every day | ORAL | 1 refills | Status: DC

## 2017-09-02 MED ORDER — METOPROLOL SUCCINATE ER 50 MG PO TB24: 50.0000 mg | ORAL_TABLET | Freq: Every day | ORAL | Status: DC

## 2017-09-02 MED ORDER — ISOSORBIDE MONONITRATE ER 60 MG PO TB24: 30.0000 mg | ORAL_TABLET | Freq: Every day | ORAL | 6 refills | Status: DC

## 2017-09-02 MED ORDER — ASPIRIN 81 MG PO CHEW: 81.0000 mg | CHEWABLE_TABLET | Freq: Every day | ORAL | Status: AC

## 2017-09-02 NOTE — Discharge Summary (Addendum)
The patient has been seen in conjunction with Harlan Stains, NP-C. All aspects of care have been considered and discussed. The patient has been personally interviewed, examined, and all clinical data has been reviewed.   The patient's prehospital angina which required up titration was all likely related to restenosis within the obtuse marginal stent.  This territory is now infarcted.  We will try to simplify his medical regimen by discontinuing Ranexa and decreasing isosorbide intensity.  We have added beta-blocker therapy, he should remain on ACE inhibitor therapy, and Sudafed should be discontinued.  Needs follow-up with Dr. Gwenlyn Found in 7-10 days.  Discharge Summary    Patient ID: [name],  MRN: [id number], DOB/AGE: [birth year] 72 y.o.  Admit date: 08/29/2017 Discharge date: 09/02/2017  Primary Care Provider: [name] Primary Cardiologist: Gwenlyn Found  Discharge Diagnoses    Active Problems:   Non-STEMI (non-ST elevated myocardial infarction) ([place])   DM (diabetes mellitus) ([place])   Dyslipidemia   Statin intolerance   Chest pain   HTN (hypertension)   Allergies Allergies  Allergen Reactions  . Propoxyphene N-Acetaminophen Nausea Only  . Statins Other (See Comments)    Severe muscle cramping/aching/pain  . Tape     Blisters  . Zetia [Ezetimibe]     Muscle cramping  . Flomax [Tamsulosin Hcl] Rash  . Levaquin [Levofloxacin Hemihydrate] Rash  . Penicillins Rash    Broke out in rash 6 years ago, pt recently took penicillin (09/2016) and had no reaction Has patient had a PCN reaction causing immediate rash, facial/tongue/throat swelling, SOB or lightheadedness with hypotension: Yes Has patient had a PCN reaction causing severe rash involving mucus membranes or skin necrosis: Unknown Has patient had a PCN reaction that required hospitalization: No Has patient had a PCN reaction occurring within the last 10 years: Yes If all of the above answers are "NO", then m     Diagnostic Studies/Procedures    Cath: 08/30/17  Conclusion     Ost 1st Mrg to 1st Mrg lesion is 100% stenosed.  Post intervention, there is a 100% residual stenosis.  The left ventricular systolic function is normal.  LV end diastolic pressure is normal.  The left ventricular ejection fraction is 50-55% by visual estimate.   IMPRESSION: Unsuccessful circumflex obtuse marginal branch PCI of an occluded previously placed marginal branch stent of the right foot in January 2012. I was unable to rewire the vessel after initial angioplasty. He had normal LV function. At this point, I recommend continue medical therapy. He was pain-free at the end of the procedure. The sheath was removed and a TR band was placed on the right wrist which is patent hemostasis. Angiomax was discontinued.  Quay Burow. MD, [facility]   TTE: 09/01/17  Study Conclusions  - Left ventricle: The cavity size was normal. There was mild   concentric hypertrophy. Systolic function was normal. The   estimated ejection fraction was in the range of 60% to 65%. Wall   motion was normal; there were no regional wall motion   abnormalities. There was an increased relative contribution of   atrial contraction to ventricular filling. Doppler parameters are   consistent with abnormal left ventricular relaxation (grade 1   diastolic dysfunction). - Aortic valve: Mildly calcified annulus. Trileaflet; mildly   thickened, mildly calcified leaflets. - Aorta: Aortic root dimension: 40 mm (ED). - Aortic root: The aortic root was mildly dilated. - Pulmonary arteries: Systolic pressure could not be accurately   estimated. _____________  History of Present Illness     72 year old man with a history of CAD s/p muliple PCI to OM1, PVD s/p PCI (SFA, PTA), HTN, DM2, GERD, who presented with chest pain.  The patient has a history of complex vascular disease for which he follows with Dr. Gwenlyn Found. He has prior PCI to [place] with  subsequent restenosis for which he has had repeat balloon angioplasty and subsequent PCI. He has been treated with Ranexa for chronic angina. He was seen in clinic in 06/2017, at which time he complained of worsening exertional dyspnea. He underwent repeat nuclear stress test that was described to be low risk and showed small inferior defect that was felt to be due to tissue attenuation.   [name] presented to the [facility] ED with recurrent chest pain. He reported that he was walking on the treadmill and subsequently developed a cramping sensation in his chest. He had some associated nausea without dyspnea. He reported that he had a similar episode several days ago, although he reported that this pain was different than his prior chest pain.  At the ED, the patient was hypertensive to 168/95. ECG showed sinus rhythm with flattening in AVL without other acute changes. Troponin was negative x1. Transfer to [facility] was coordinated.  At the time of the patient's arrival, he reported that he was chest pain free. Repeat ECG was similar to prior. Several hours later, the patient's troponin resulted positive at 0.81 and heparin gtt was initiated.   Hospital Course     Troponin rose to 8.89 the following day and he had recurrent chest pain. Decision was made to take urgently for cath on the weekend by Dr. Haroldine Laws. Underwent cardiac cath noted above with occlusion of stent to the OM that was previously placed. Unable to pass wire. Plan for medical therapy moving forward. He was started on BB and Imdur. Started with Phase I of cardiac rehab. LDL was 120 on admission. Reported side effects on statin therapy in the past. Was restarted on Crestor 20mg  daily. Hgb A1c 6.6. Did have some recurrent chest pain, and BB was further titrated. Blood pressures remained slightly low, therefore Imdur was reduced back to 30mg  daily to allow for further titration of Toprol to 50mg  daily. Post cath labs were stable. Able to  ambulate with cardiac rehab the following day without chest discomfort. Given his intolerance to statins, would consider referral for PCSK9 as outpatient. Follow up echo showed normal EF with no WMA and G1DD.  General: Well developed, well nourished, male appearing in no acute distress. Head: Normocephalic, atraumatic.  Neck: Supple without bruits, JVD. Lungs:  Resp regular and unlabored, CTA. Heart: RRR, S1, S2, no S3, S4, or murmur; no rub. Abdomen: Soft, non-tender, non-distended with normoactive bowel sounds. No hepatomegaly. No rebound/guarding. No obvious abdominal masses. Extremities: No clubbing, cyanosis, edema. Distal pedal pulses are 2+ bilaterally. R radial cath site stable without bruising or hematoma Neuro: Alert and oriented X 3. Moves all extremities spontaneously. Psych: Normal affect.  Isiaah V Si was seen by Dr. Tamala Julian and determined stable for discharge home. Follow up in the office has been arranged. Medications are listed below.   _____________  Discharge Vitals Blood pressure 117/74, pulse 97, temperature 98.8 F (37.1 C), temperature source Oral, resp. rate 18, height 6' (1.829 m), weight 218 lb 7.6 oz (99.1 kg), SpO2 94 %.  Filed Weights   08/30/17 0300 08/30/17 0420 09/02/17 0530  Weight: 217 lb 12.8 oz (98.8 kg) 217  lb 12.8 oz (98.8 kg) 218 lb 7.6 oz (99.1 kg)    Labs & Radiologic Studies    CBC Recent Labs    08/31/17 0237  WBC 12.1*  HGB 14.9  HCT 44.6  MCV 95.1  PLT 382   Basic Metabolic Panel Recent Labs    08/31/17 0237  NA 137  K 3.9  CL 104  CO2 22  GLUCOSE 148*  BUN 9  CREATININE 1.18  CALCIUM 9.2   Liver Function Tests No results for input(s): AST, ALT, ALKPHOS, BILITOT, PROT, ALBUMIN in the last 72 hours. No results for input(s): LIPASE, AMYLASE in the last 72 hours. Cardiac Enzymes Recent Labs    08/30/17 1601  TROPONINI 6.90*   BNP Invalid input(s): POCBNP D-Dimer No results for input(s): DDIMER in the last 72  hours. Hemoglobin A1C Recent Labs    08/31/17 0237  HGBA1C 6.6*   Fasting Lipid Panel No results for input(s): CHOL, HDL, LDLCALC, TRIG, CHOLHDL, LDLDIRECT in the last 72 hours. Thyroid Function Tests No results for input(s): TSH, T4TOTAL, T3FREE, THYROIDAB in the last 72 hours.  Invalid input(s): FREET3 _____________  Dg Chest 2 View  Result Date: 08/29/2017 CLINICAL DATA:  Chest pain EXAM: CHEST  2 VIEW COMPARISON:  November 24, 2016 FINDINGS: There is no edema or consolidation. Heart size and pulmonary vascularity are normal. No adenopathy. There is aortic atherosclerosis. There is a small metallic foreign body in the anterior right hemithorax, stable. No pneumothorax. There is degenerative change in the thoracic spine. IMPRESSION: No edema or consolidation.  Aortic atherosclerosis. Aortic Atherosclerosis (ICD10-I70.0). Electronically Signed   By: Lowella Grip III M.D.   On: 08/29/2017 13:37   Disposition   Pt is being discharged home today in good condition.  Follow-up Plans & Appointments    Follow-up Information    Erlene Quan, PA-C Follow up on 09/10/2017.   Specialties:  Cardiology, Radiology Why:  at 11am for your follow up appt  Contact information: [address] [place] [address] [phone]          Discharge Instructions    AMB Referral to Cardiac Rehabilitation - Phase II   Complete by:  As directed    Diagnosis:  NSTEMI   Amb Referral to Cardiac Rehabilitation   Complete by:  As directed    Diagnosis:  NSTEMI   Diet - low sodium heart healthy   Complete by:  As directed    Discharge instructions   Complete by:  As directed    Radial Site Care Refer to this sheet in the next few weeks. These instructions provide you with information on caring for yourself after your procedure. Your caregiver may also give you more specific instructions. Your treatment has been planned according to current medical practices, but problems sometimes  occur. Call your caregiver if you have any problems or questions after your procedure. HOME CARE INSTRUCTIONS You may shower the day after the procedure.Remove the bandage (dressing) and gently wash the site with plain soap and water.Gently pat the site dry.  Do not apply powder or lotion to the site.  Do not submerge the affected site in water for 3 to 5 days.  Inspect the site at least twice daily.  Do not flex or bend the affected arm for 24 hours.  No lifting over 5 pounds (2.3 kg) for 5 days after your procedure.  Do not drive home if you are discharged the same day of the procedure. Have someone else  drive you.  You may drive 24 hours after the procedure unless otherwise instructed by your caregiver.  What to expect: Any bruising will usually fade within 1 to 2 weeks.  Blood that collects in the tissue (hematoma) may be painful to the touch. It should usually decrease in size and tenderness within 1 to 2 weeks.  SEEK IMMEDIATE MEDICAL CARE IF: You have unusual pain at the radial site.  You have redness, warmth, swelling, or pain at the radial site.  You have drainage (other than a small amount of blood on the dressing).  You have chills.  You have a fever or persistent symptoms for more than 72 hours.  You have a fever and your symptoms suddenly get worse.  Your arm becomes pale, cool, tingly, or numb.  You have heavy bleeding from the site. Hold pressure on the site.   PLEASE DO NOT MISS ANY DOSES OF YOUR PLAVIX!!!!! Also keep a log of you blood pressures and bring back to your follow up appt. Please call the office with any questions.   Patients taking blood thinners should generally stay away from medicines like ibuprofen, Advil, Motrin, naproxen, and Aleve due to risk of stomach bleeding. You may take Tylenol as directed or talk to your primary doctor about alternatives.   Increase activity slowly   Complete by:  As directed       Discharge Medications     Medication  List    STOP taking these medications   ECOTRIN 325 MG EC tablet Generic drug:  aspirin Replaced by:  aspirin 81 MG chewable tablet   pseudoephedrine 30 MG tablet Commonly known as:  SUDAFED   ranolazine 500 MG 12 hr tablet Commonly known as:  RANEXA   testosterone cypionate 200 MG/ML injection Commonly known as:  DEPOTESTOSTERONE CYPIONATE     TAKE these medications   acetaminophen 500 MG tablet Commonly known as:  TYLENOL Take 1,000 mg by mouth every 6 (six) hours as needed for mild pain.   aspirin 81 MG chewable tablet Chew 1 tablet (81 mg total) by mouth daily. Start taking on:  09/03/2017 Replaces:  ECOTRIN 325 MG EC tablet   clopidogrel 75 MG tablet Commonly known as:  PLAVIX TAKE ONE TABLET BY MOUTH ONCE DAILY.   Co Q-10 400 MG Caps Take 400 mg by mouth daily.   cyclobenzaprine 10 MG tablet Commonly known as:  FLEXERIL Take 10 mg by mouth 3 (three) times daily as needed for muscle spasms.   dexlansoprazole 60 MG capsule Commonly known as:  DEXILANT Take 1 capsule (60 mg total) by mouth daily before breakfast.   dextromethorphan-guaiFENesin 30-600 MG 12hr tablet Commonly known as:  MUCINEX DM Take 1 tablet by mouth 2 (two) times daily.   fexofenadine 180 MG tablet Commonly known as:  ALLEGRA Take 180 mg by mouth daily.   glipiZIDE-metformin 5-500 MG tablet Commonly known as:  METAGLIP Take 1 tablet by mouth every evening.   isosorbide mononitrate 60 MG 24 hr tablet Commonly known as:  IMDUR Take 0.5 tablets (30 mg total) by mouth daily. What changed:  how much to take   loratadine 10 MG tablet Commonly known as:  CLARITIN Take 10 mg by mouth daily.   metoprolol succinate 25 MG 24 hr tablet Commonly known as:  TOPROL-XL Take 2 tablets (50 mg total) by mouth daily. What changed:  how much to take   nitroGLYCERIN 0.4 MG SL tablet Commonly known as:  NITROSTAT Place 1 tablet (0.4 mg  total) under the tongue every 5 (five) minutes as needed. For  chest pain   pantoprazole 40 MG tablet Commonly known as:  PROTONIX Take 40 mg by mouth daily.   ramipril 5 MG capsule Commonly known as:  ALTACE TAKE (1) CAPSULE BY MOUTH ONCE DAILY.   rosuvastatin 20 MG tablet Commonly known as:  CRESTOR Take 1 tablet (20 mg total) by mouth daily at 6 PM.        Aspirin prescribed at discharge?  Yes High Intensity Statin Prescribed? (Lipitor 40-80mg  or Crestor 20-40mg ): Yes Beta Blocker Prescribed? Yes, but consider referral to lipid clinic as he has been intolerant to statins in the past.  For EF <40%, was ACEI/ARB Prescribed? No: EF ok ADP Receptor Inhibitor Prescribed? (i.e. Plavix etc.-Includes Medically Managed Patients): Yes For EF <40%, Aldosterone Inhibitor Prescribed? No: EF ok Was EF assessed during THIS hospitalization? Yes Was Cardiac Rehab II ordered? (Included Medically managed Patients): Yes   Outstanding Labs/Studies   FLP/LFTs in 6 weeks if tolerating statin.   Duration of Discharge Encounter   Greater than 30 minutes including physician time.  Signed, Reino Bellis NP-C 09/02/2017, 1:58 PM

## 2017-09-02 NOTE — Care Management Important Message (Signed)
Important Message  Patient Details  Name: [name] MRN: [id number] Date of Birth: 1946-06-15   Medicare Important Message Given:  Yes    Sahvannah Rieser [address] 09/02/2017, 12:45 PM

## 2017-09-02 NOTE — Progress Notes (Signed)
Patient received discharge information and acknowledged understanding of it. Patient IV was removed.  

## 2017-09-02 NOTE — Progress Notes (Signed)
CARDIAC REHAB PHASE I   PRE:  Rate/Rhythm: 89 SR  BP:  Supine:   Sitting: 109/65  Standing:    SaO2: 96%RA  MODE:  Ambulation: 940 ft   POST:  Rate/Rhythm: 104 ST  BP:  Supine:   Sitting: 1216/72  Standing:    SaO2: 97%RA [id number] Pt walked 940 ft with steady gait and no CP. Tolerated well.   Graylon Good, RN BSN  09/02/2017 10:03 AM

## 2017-09-03 NOTE — Consult Note (Signed)
            [facility] CM Primary Care Navigator  09/03/2017  DMONI FORTSON 1946/04/01 [id number]   Went to see patient to identify possible discharge needs but he was already discharged per staff report. Per chart review, patientwas admitted with chest pain and underwent Left Heart catheterization and coronary angiography.  Primary care provider's office called(spoke to Nellie)to notify of patient's discharge and need for post hospital follow-up and transition of care. Notified of patient's health issues needing follow-up. Made aware to refer patient to [facility] care management if deemed necessary and appropriate for services.  Patient has discharge instruction tofollow-up withcardiology on 09/10/17 at 11 am.   For additional questions please contact:  Edwena Felty A. Keyon Liller, BSN, RN-BC [facility] PRIMARY CARE Navigator Cell: [phone]

## 2017-09-08 DIAGNOSIS — I214 Non-ST elevation (NSTEMI) myocardial infarction: Secondary | ICD-10-CM | POA: Diagnosis not present

## 2017-09-09 ENCOUNTER — Encounter: Payer: Self-pay | Admitting: Cardiology

## 2017-09-10 ENCOUNTER — Ambulatory Visit: Payer: PPO | Admitting: Cardiology

## 2017-09-10 ENCOUNTER — Encounter: Payer: Self-pay | Admitting: Cardiology

## 2017-09-10 VITALS — BP 124/76 | HR 62 | Ht 72.0 in | Wt 214.4 lb

## 2017-09-10 DIAGNOSIS — I739 Peripheral vascular disease, unspecified: Secondary | ICD-10-CM

## 2017-09-10 DIAGNOSIS — Z789 Other specified health status: Secondary | ICD-10-CM | POA: Diagnosis not present

## 2017-09-10 DIAGNOSIS — E119 Type 2 diabetes mellitus without complications: Secondary | ICD-10-CM | POA: Diagnosis not present

## 2017-09-10 DIAGNOSIS — I10 Essential (primary) hypertension: Secondary | ICD-10-CM | POA: Diagnosis not present

## 2017-09-10 DIAGNOSIS — I214 Non-ST elevation (NSTEMI) myocardial infarction: Secondary | ICD-10-CM | POA: Diagnosis not present

## 2017-09-10 DIAGNOSIS — E782 Mixed hyperlipidemia: Secondary | ICD-10-CM | POA: Diagnosis not present

## 2017-09-10 DIAGNOSIS — Z9861 Coronary angioplasty status: Secondary | ICD-10-CM | POA: Diagnosis not present

## 2017-09-10 DIAGNOSIS — I2510 Atherosclerotic heart disease of native coronary artery without angina pectoris: Secondary | ICD-10-CM | POA: Diagnosis not present

## 2017-09-10 NOTE — Assessment & Plan Note (Signed)
Admitted 08/29/17 with NSTEMI-Troponin peak 8.8, normal LVF

## 2017-09-10 NOTE — Patient Instructions (Signed)
Medication Instructions:  Your physician recommends that you continue on your current medications as directed. Please refer to the Current Medication list given to you today.  Labwork: Your physician recommends that you schedule a follow-up appointment in: 3 months FASTING-CMET, LIPIDS  Testing/Procedures: None   Follow-Up: Your physician recommends that you schedule a follow-up appointment in: 3 months with Dr Gwenlyn Found.  Any Other Special Instructions Will Be Listed Below (If Applicable).     If you need a refill on your cardiac medications before your next appointment, please call your pharmacy.

## 2017-09-10 NOTE — Assessment & Plan Note (Signed)
Controlled.  

## 2017-09-10 NOTE — Progress Notes (Signed)
09/10/2017 [name]   05/30/1946  [id number]  Primary Physician [name], MD Primary Cardiologist: [name]  HPI:  72 y/o AA male followed by [name] with a history of CAD, PVD, DM, HTN, and statin intolerance, admitted to [facility] 08/29/17 with SSCP and ruled in for NSTEMI. His Troponin peaked at 8.8. The pt has known CAD and has had previous OM PCI in 2012 (three different times). Cath done 08/30/17 revealed ISR of the OM1 with no other significant CAD. He underwent unsuccessful attempt at PCI by [name] and the plan is for medical Rx. He is in the office today for follow up. The pt has had statin intolerance in the past with pain and cramping in his back and thighs. His LDL was 125 during his recent hospitalization and it was decided to challenge the pt with Crestor 20 mg. So far he is tolerating this well. He denies any chest pain.    Current Outpatient Medications  Medication Sig Dispense Refill  . acetaminophen (TYLENOL) 500 MG tablet Take 1,000 mg by mouth every 6 (six) hours as needed for mild pain.    Marland Kitchen aspirin 81 MG chewable tablet Chew 1 tablet (81 mg total) by mouth daily.    . clopidogrel (PLAVIX) 75 MG tablet TAKE ONE TABLET BY MOUTH ONCE DAILY. 30 tablet 9  . Coenzyme Q10 (CO Q-10) 400 MG CAPS Take 400 mg by mouth daily.    . cyclobenzaprine (FLEXERIL) 10 MG tablet Take 10 mg by mouth 3 (three) times daily as needed for muscle spasms.    Marland Kitchen dextromethorphan-guaiFENesin (MUCINEX DM) 30-600 MG 12hr tablet Take 1 tablet by mouth 2 (two) times daily.    . fexofenadine (ALLEGRA) 180 MG tablet Take 180 mg by mouth daily.      Marland Kitchen glipiZIDE-metformin (METAGLIP) 5-500 MG per tablet Take 1 tablet by mouth every evening.    . isosorbide mononitrate (IMDUR) 30 MG 24 hr tablet Take 30 mg by mouth daily.    Marland Kitchen lisinopril (PRINIVIL,ZESTRIL) 5 MG tablet Take 5 mg by mouth daily.    Marland Kitchen loratadine (CLARITIN) 10 MG tablet Take 10 mg by mouth daily.    . metoprolol succinate (TOPROL-XL)  25 MG 24 hr tablet Take 2 tablets (50 mg total) by mouth daily. 30 tablet 3  . nitroGLYCERIN (NITROSTAT) 0.4 MG SL tablet Place 1 tablet (0.4 mg total) under the tongue every 5 (five) minutes as needed. For chest pain 25 tablet 11  . pantoprazole (PROTONIX) 40 MG tablet Take 40 mg by mouth daily.    . rosuvastatin (CRESTOR) 20 MG tablet Take 1 tablet (20 mg total) by mouth daily at 6 PM. 30 tablet 1   No current facility-administered medications for this visit.     Allergies  Allergen Reactions  . Propoxyphene N-Acetaminophen Nausea Only  . Statins Other (See Comments)    Severe muscle cramping/aching/pain  . Tape     Blisters  . Zetia [Ezetimibe]     Muscle cramping  . Flomax [Tamsulosin Hcl] Rash  . Levaquin [Levofloxacin Hemihydrate] Rash  . Penicillins Rash    Broke out in rash 6 years ago, pt recently took penicillin (09/2016) and had no reaction Has patient had a PCN reaction causing immediate rash, facial/tongue/throat swelling, SOB or lightheadedness with hypotension: Yes Has patient had a PCN reaction causing severe rash involving mucus membranes or skin necrosis: Unknown Has patient had a PCN reaction that required hospitalization: No Has patient had a PCN  reaction occurring within the last 10 years: Yes If all of the above answers are "NO", then m    Past Medical History:  Diagnosis Date  . CHF (congestive heart failure) ([place])   . Coronary artery disease    s/p multiple caths 2012, stenting 1/19 NSTEMI with occlusion of OM, unable to wire-->Rx therapy, normal EF  . Diabetes mellitus   . GERD (gastroesophageal reflux disease)   . Hyperlipidemia   . Hypertension   . PVD (peripheral vascular disease) ([name])    left SFA PTA & stenting in 02/2005 (Ricky. Adora Fridge)    Social History   Socioeconomic History  . Marital status: Married    Spouse name: Not on file  . Number of children: 3  . Years of education: Not on file  . Highest education level: Not on file  Social  Needs  . Financial resource strain: Not on file  . Food insecurity - worry: Not on file  . Food insecurity - inability: Not on file  . Transportation needs - medical: Not on file  . Transportation needs - non-medical: Not on file  Occupational History  . Occupation: Retired    Fish farm manager: [employer]  Tobacco Use  . Smoking status: Never Smoker  . Smokeless tobacco: Never Used  . Tobacco comment: quit 10 yrs ago  Substance and Sexual Activity  . Alcohol use: No  . Drug use: No  . Sexual activity: No  Other Topics Concern  . Not on file  Social History Narrative  . Not on file     Family History  Problem Relation Age of Onset  . Arrhythmia Mother 21  . Colon cancer Neg Hx   . Liver disease Neg Hx   . Inflammatory bowel disease Neg Hx   . Colon polyps Neg Hx      Review of Systems: General: negative for chills, fever, night sweats or weight changes.  Cardiovascular: negative for chest pain, dyspnea on exertion, edema, orthopnea, palpitations, paroxysmal nocturnal dyspnea or shortness of breath Dermatological: negative for rash Respiratory: negative for cough or wheezing Urologic: negative for hematuria Abdominal: negative for nausea, vomiting, diarrhea, bright red blood per rectum, melena, or hematemesis Neurologic: negative for visual changes, syncope, or dizziness All other systems reviewed and are otherwise negative except as noted above.    Blood pressure 124/76, pulse 62, height 6' (1.829 m), weight 214 lb 6.4 oz (97.3 kg).  General appearance: alert, cooperative and no distress Neck: no carotid bruit and no JVD Lungs: clear to auscultation bilaterally Heart: regular rate and rhythm Extremities: extremities normal, atraumatic, no cyanosis or edema Skin: Skin color, texture, turgor normal. No rashes or lesions Neurologic: Grossly normal   ASSESSMENT AND PLAN:   CAD S/P percutaneous coronary angioplasty Om1 BMS [id number], ISR July 2012 Rx'd with cutting  balloon, ISR again Dec 2012 Rx'd with DES. finally noted to be occluded with unsuccessful PCI jan 2019. No significant residual disease. Normal LVF.   NSTEMI (non-ST elevated myocardial infarction) ([place]) Admitted 08/29/17 with NSTEMI-Troponin peak 8.8, normal LVF  Peripheral vascular disease ([place]) Lt SFA PTA 2006, dopplers OK jan 2019  Essential hypertension Controlled  Statin intolerance Crestor added Jan 2019-LDL 125 Consider evaluation for PCSK9 if he can't tolerate Crestor. Check lipids and LFTs in 3 months  Non-insulin treated type 2 diabetes mellitus (HCC) Type II on oral agents   PLAN  Continue current Rx. Continue Plavix with his history of prior Lt SFA PTA but this could be stopped for  procedures if needed. Check lipids and LFTs in 3 months on Crestor, f/u with [name] after this. Consider pharmacy consult for PCSK9 if his LDL is not at goal. I asked him to hold off an full exercise for another two weeks, then increase as tolerated.   Kerin Ransom PA-C 09/10/2017 11:15 AM

## 2017-09-10 NOTE — Assessment & Plan Note (Signed)
Lt SFA PTA 2006, dopplers OK jan 2019  

## 2017-09-10 NOTE — Assessment & Plan Note (Signed)
Om1 BMS Jan2012, ISR July 2012 Rx'd with cutting balloon, ISR again Dec 2012 Rx'd with DES. finally noted to be occluded with unsuccessful PCI jan 2019. No significant residual disease. Normal LVF.  

## 2017-09-10 NOTE — Assessment & Plan Note (Signed)
Crestor added Jan 2019-LDL 125 Consider evaluation for PCSK9 if he can't tolerate Crestor. Check lipids and LFTs in 3 months

## 2017-09-10 NOTE — Assessment & Plan Note (Signed)
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 

## 2017-09-24 ENCOUNTER — Telehealth: Payer: Self-pay | Admitting: Cardiovascular Disease

## 2017-09-24 ENCOUNTER — Encounter: Payer: Self-pay | Admitting: Physician Assistant

## 2017-09-24 ENCOUNTER — Ambulatory Visit: Payer: PPO | Admitting: Physician Assistant

## 2017-09-24 VITALS — BP 118/70 | HR 60 | Ht 72.0 in | Wt 212.0 lb

## 2017-09-24 DIAGNOSIS — I208 Other forms of angina pectoris: Secondary | ICD-10-CM | POA: Diagnosis not present

## 2017-09-24 DIAGNOSIS — I2510 Atherosclerotic heart disease of native coronary artery without angina pectoris: Secondary | ICD-10-CM

## 2017-09-24 DIAGNOSIS — Z9861 Coronary angioplasty status: Secondary | ICD-10-CM | POA: Diagnosis not present

## 2017-09-24 DIAGNOSIS — E782 Mixed hyperlipidemia: Secondary | ICD-10-CM

## 2017-09-24 DIAGNOSIS — I214 Non-ST elevation (NSTEMI) myocardial infarction: Secondary | ICD-10-CM

## 2017-09-24 MED ORDER — RANOLAZINE ER 500 MG PO TB12: 500.0000 mg | ORAL_TABLET | Freq: Two times a day (BID) | ORAL | 6 refills | Status: DC

## 2017-09-24 NOTE — Telephone Encounter (Signed)
Spoke with pt, for the last week he has been getting chest pain with exertion. He reports walking to the mailbox and back. Once he gets back to the house he feels weak, SOB and develops chest tightness. He will rest and it will go away. Appointment made for patient to see the APP this afternoon. Patient voiced understanding if he develops pain that does not go away with rest to use his NTG. He reports he did not have any symptoms with his 1st MI.

## 2017-09-24 NOTE — Progress Notes (Signed)
Cardiology Office Note   Date:  09/24/2017   ID:  [name], DOB 11/28/1945, MRN [id number]  PCP:  [name], MD  Cardiologist: Dr. Gwenlyn Found, 07/15/2017 Rosaria Ferries, PA-C   Chief Complaint  Patient presents with  . Follow-up  . Shortness of Breath  . Headache  . Chest Pain    Exertion.    History of Present Illness: [name] is a 72 y.o. male with a history of PCI OM1 x 3, PTA SFA, DM, HTN, GERD, HLD w/ statin intolerance now on Crestor  Admitted 1/19-1/23/2019 with NSTEMI 08/2017 w/ occlusion of OM1>>rx meds, Crestor 20 mg daily added, hemoglobin A1c 6.6, blood pressures elevated and beta-blocker increased, EF normal at echo, Ranexa d/c'd and Imdur decreased to simplify meds and allow BB increase 09/10/2017 office visit, patient doing well 09/24/2017 phone notes regarding shortness of breath, appointment made  [name] presents for cardiology follow up.  He has been getting more chest pain, it starts with exertion, but not much exertion. He will get SOB with it, tired and weak. 3-4/10, cramping pain. Relieved by rest in < 5 minutes. He will also get a little light-headed with it. No N&V or diaphoresis.  He will get the pain with walking 150-200 feet on flat ground. He took SL NTG twice for it, with relief.  It is not quite like the pain with his recent MI, but is like the pain he had when he was admitted 08/2013 w/ CP, ez neg MI, MV not high-risk. Sx resolved with decrease BB and add Ranexa  No LE edema, no orthopnea or PND. No palpitations, no presyncope or syncope.  He is not particularly interested in cardiac rehab, he is a regular exerciser. He had CP after he finished exercising at a higher level than normal.    Past Medical History:  Diagnosis Date  . CHF (congestive heart failure) ([place])   . Coronary artery disease    s/p multiple caths 2012, stenting 1/19 NSTEMI with occlusion of OM, unable to wire-->Rx therapy, normal EF  . Diabetes mellitus    . GERD (gastroesophageal reflux disease)   . Hyperlipidemia   . Hypertension   . PVD (peripheral vascular disease) ([place])    left SFA PTA & stenting in 02/2005 (Dr. Adora Fridge)    Past Surgical History:  Procedure Laterality Date  . CARDIAC CATHETERIZATION  12/23/2004   normal L main, normal LAD, normal L Cfx, RCA with 20% hypodense lesion in first end of vessel (Dr. Adora Fridge)  . CARDIAC CATHETERIZATION  08/26/2007   no significant CAD by cath, EF 50% (Dr. Jackie Plum)  . CARDIAC CATHETERIZATION  08/28/2010   stent to OM1 with 2.0x55mm BMS (Dr. Adora Fridge)  . CARDIAC CATHETERIZATION  09/11/2010   patent stent (Dr. Roni Bread)  . CARDIAC CATHETERIZATION  02/10/2011   95% prox in-stent restenosis within OM stent - opened with cutting balloon (Dr. Corky Downs)  . CARDIAC CATHETERIZATION  07/17/2011   in-stent restenosis - re-stented with Promus 2.25x30mm DES (Dr. Roni Bread)  . COLONOSCOPY  12/2009   Dr. Hampton Abbot  . COLONOSCOPY N/A 01/30/2017   Procedure: COLONOSCOPY;  Surgeon: Daneil Dolin, MD;  Location: [facility];  Service: Endoscopy;  Laterality: N/A;  8:15am  . CORONARY BALLOON ANGIOPLASTY N/A 08/30/2017   Procedure: CORONARY BALLOON ANGIOPLASTY;  Surgeon: [name], MD;  Location: [place];  Service: Cardiovascular;  Laterality: N/A;  . 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 FEMORAL ARTERY STENT  02/27/2005   L SFA stenting - [place] down SFA across lesion - predilatation with 4x4 Powerflex, stenting with 7x4 Smart, post-dilatation with 6x4 powerflex (Dr. Adora Fridge)  . LEFT HEART CATH AND CORONARY ANGIOGRAPHY N/A 08/30/2017   Procedure: LEFT HEART CATH AND CORONARY ANGIOGRAPHY;  Surgeon: [name], MD;  Location: [facility];  Service: Cardiovascular;  Laterality: N/A;  . LEFT HEART CATHETERIZATION WITH CORONARY ANGIOGRAM N/A 07/17/2011    Procedure: LEFT HEART CATHETERIZATION WITH CORONARY ANGIOGRAM;  Surgeon: Leonie Man, MD;  Location[facility];  Service: Cardiovascular;  Laterality: N/A;  Right radial approach  . left knee arthroscopy  05/2016  . NM MYOCAR PERF WALL MOTION  09/08/2013   abnormal lexiscan - low to intermediate risk;   . TRANSTHORACIC ECHOCARDIOGRAM  09/08/2013   EF 50-55%, mild LVH, grade 1 diastolic dysfunction, mildly calcified AV annulus, calcified MV, LA mildly dilated,     Current Outpatient Medications  Medication Sig Dispense Refill  . acetaminophen (TYLENOL) 500 MG tablet Take 1,000 mg by mouth every 6 (six) hours as needed for mild pain.    Marland Kitchen aspirin 81 MG chewable tablet Chew 1 tablet (81 mg total) by mouth daily.    . clopidogrel (PLAVIX) 75 MG tablet TAKE ONE TABLET BY MOUTH ONCE DAILY. 30 tablet 9  . Coenzyme Q10 (CO Q-10) 400 MG CAPS Take 400 mg by mouth daily.    . cyclobenzaprine (FLEXERIL) 10 MG tablet Take 10 mg by mouth 3 (three) times daily as needed for muscle spasms.    Marland Kitchen dextromethorphan-guaiFENesin (MUCINEX DM) 30-600 MG 12hr tablet Take 1 tablet by mouth 2 (two) times daily.    . fexofenadine (ALLEGRA) 180 MG tablet Take 180 mg by mouth daily.      Marland Kitchen glipiZIDE-metformin (METAGLIP) 5-500 MG per tablet Take 1 tablet by mouth every evening.    . isosorbide mononitrate (IMDUR) 30 MG 24 hr tablet Take 30 mg by mouth daily.    Marland Kitchen lisinopril (PRINIVIL,ZESTRIL) 5 MG tablet Take 5 mg by mouth daily.    Marland Kitchen loratadine (CLARITIN) 10 MG tablet Take 10 mg by mouth daily.    . metoprolol succinate (TOPROL-XL) 25 MG 24 hr tablet Take 2 tablets (50 mg total) by mouth daily. 30 tablet 3  . nitroGLYCERIN (NITROSTAT) 0.4 MG SL tablet Place 1 tablet (0.4 mg total) under the tongue every 5 (five) minutes as needed. For chest pain 25 tablet 11  . pantoprazole (PROTONIX) 40 MG tablet Take 40 mg by mouth daily.    . rosuvastatin (CRESTOR) 20 MG tablet Take 1 tablet (20 mg total) by mouth daily at 6 PM. 30  tablet 1   No current facility-administered medications for this visit.     Allergies:   Propoxyphene n-acetaminophen; Statins; Tape; Zetia [ezetimibe]; Flomax [tamsulosin hcl]; Levaquin [levofloxacin hemihydrate]; and Penicillins    Social History:  The patient  reports that  has never smoked. he has never used smokeless tobacco. He reports that he does not drink alcohol or use drugs.   Family History:  The patient's family history includes Arrhythmia (age of onset: 72) in his mother.    ROS:  Please see the history of present illness. All other systems are reviewed and negative.    PHYSICAL EXAM: VS:  BP 118/70   Pulse 60   Ht 6' (1.829 m)   Wt 212 lb (  96.2 kg)   BMI 28.75 kg/m  , BMI Body mass index is 28.75 kg/m. GEN: Well nourished, well developed, male in no acute distress  HEENT: normal for age  Neck: no JVD, no carotid bruit, no masses Cardiac: RRR; no murmur, no rubs, or gallops Respiratory:  clear to auscultation bilaterally, normal work of breathing GI: soft, nontender, nondistended, + BS MS: no deformity or atrophy; no edema; distal pulses are 2+ in all 4 extremities   Skin: warm and dry, no rash Neuro:  Strength and sensation are intact Psych: euthymic mood, full affect   EKG:  EKG is ordered today. The ekg ordered today demonstrates SR, HR 60, Q waves III only   Cath: 08/30/17 Conclusion    Ost 1st Mrg to 1st Mrg lesion is 100% stenosed.  Post intervention, there is a 100% residual stenosis.  The left ventricular systolic function is normal.  LV end diastolic pressure is normal.  The left ventricular ejection fraction is 50-55% by visual estimate.  IMPRESSION:Unsuccessful circumflex obtuse marginal branch PCI of an occluded previously placed marginal branch stent of the right foot in January 2012. I was unable to rewire the vessel after initial angioplasty. He had normal LV function. At this point, I recommend continue medical therapy. He was  pain-free at the end of the procedure. The sheath was removed and a TR band was placed on the right wrist which is patent hemostasis. Angiomax was discontinued. Quay Burow. MD, [facility]   TTE: 09/01/17 Study Conclusions - Left ventricle: The cavity size was normal. There was mild concentric hypertrophy. Systolic function was normal. The estimated ejection fraction was in the range of 60% to 65%. Wall motion was normal; there were no regional wall motion abnormalities. There was an increased relative contribution of atrial contraction to ventricular filling. Doppler parameters are consistent with abnormal left ventricular relaxation (grade 1 diastolic dysfunction). - Aortic valve: Mildly calcified annulus. Trileaflet; mildly thickened, mildly calcified leaflets. - Aorta: Aortic root dimension: 40 mm (ED). - Aortic root: The aortic root was mildly dilated. - Pulmonary arteries: Systolic pressure could not be accurately estimated.   Recent Labs: 08/29/2017: ALT 15; B Natriuretic Peptide 18.0 08/30/2017: Magnesium 1.9 08/31/2017: BUN 9; Creatinine, Ser 1.18; Hemoglobin 14.9; Platelets 175; Potassium 3.9; Sodium 137    Lipid Panel    Component Value Date/Time   CHOL 187 08/30/2017 0315   CHOL 162 08/22/2015 0916   TRIG 136 08/30/2017 0315   HDL 35 (L) 08/30/2017 0315   HDL 47 08/22/2015 0916   CHOLHDL 5.3 08/30/2017 0315   VLDL 27 08/30/2017 0315   LDLCALC 125 (H) 08/30/2017 0315   LDLCALC 94 08/22/2015 0916   LDLDIRECT 66 04/13/2013 0732     Wt Readings from Last 3 Encounters:  09/24/17 212 lb (96.2 kg)  09/10/17 214 lb 6.4 oz (97.3 kg)  09/02/17 218 lb 7.6 oz (99.1 kg)     Other studies Reviewed: Additional studies/ records that were reviewed today include: Office notes, hospital records and testing  ASSESSMENT AND PLAN:  1.  Non-STEMI, subsequent episode of CARE: He is on good medical therapy with aspirin, Plavix, Imdur 30, Toprol-XL 50 mg a day,  lisinopril 5 mg a day and Crestor 20 mg a day.  Medical therapy for chronically occluded OM  2.  Chronic angina: The patient feels he did better in the past when he was on a lower dose of metoprolol and on her know was seen.  There were no as he was discontinued  during his last admission in order to simplify his medication regimen.  His beta-blocker was increased.    We will restart the ranolazine 500 mg twice daily and try to keep the beta-blocker at the same dose.  He understands that if symptoms do not improve, we can always cut back on the metoprolol and increase the isosorbide.  He is encouraged to attend cardiac rehab, but does not feel it will benefit him as he is a regular exerciser anyway.  He is encouraged to start to increase his activity, but to do it gradually and be sensitive to his symptoms.  3.  Dyslipidemia: He is currently tolerating Crestor 20 mg daily.  Recheck lipids and liver functions prior to his follow-up visit with Dr. Alvester Chou in April.  Current medicines are reviewed at length with the patient today.  The patient has concerns regarding medicines.  Concerns were addressed  The following changes have been made: Add Ranexa  Labs/ tests ordered today include:  No orders of the defined types were placed in this encounter.    Disposition:   FU with Dr. Alvester Chou  Signed, Rosaria Ferries, PA-C  09/24/2017 2:12 PM    [facility] Phone: [phone]; Fax: [phone]  This note was written with the assistance of speech recognition software. Please excuse any transcriptional errors.

## 2017-09-24 NOTE — Telephone Encounter (Signed)
New message    Pt c/o Shortness Of Breath: STAT if SOB developed within the last 24 hours or pt is noticeably SOB on the phone  1. Are you currently SOB (can you hear that pt is SOB on the phone)? Yes  2. How long have you been experiencing SOB? 1 week 3. Are you SOB when sitting or when up moving around? Moving around  4. Are you currently experiencing any other symptoms? Weak, chest pain , lightheaded

## 2017-09-24 NOTE — Patient Instructions (Signed)
Medication Instructions:  RESTART Renexa 500 mg Take 1 tablet twice a day  Labwork: Labs ordered please complete a week before next appointment with Dr Gwenlyn Found.  Testing/Procedures: None   Follow-Up: Keep follow up appointment as scheduled  Any Other Special Instructions Will Be Listed Below (If Applicable). If you need a refill on your cardiac medications before your next appointment, please call your pharmacy.

## 2017-11-11 ENCOUNTER — Other Ambulatory Visit: Payer: Self-pay | Admitting: Cardiology

## 2017-11-12 NOTE — Telephone Encounter (Signed)
This is Dr. Berry's pt 

## 2017-11-19 DIAGNOSIS — E785 Hyperlipidemia, unspecified: Secondary | ICD-10-CM | POA: Diagnosis not present

## 2017-11-19 DIAGNOSIS — E119 Type 2 diabetes mellitus without complications: Secondary | ICD-10-CM | POA: Diagnosis not present

## 2017-11-19 DIAGNOSIS — E559 Vitamin D deficiency, unspecified: Secondary | ICD-10-CM | POA: Diagnosis not present

## 2017-11-19 DIAGNOSIS — I10 Essential (primary) hypertension: Secondary | ICD-10-CM | POA: Diagnosis not present

## 2017-11-19 DIAGNOSIS — E1169 Type 2 diabetes mellitus with other specified complication: Secondary | ICD-10-CM | POA: Diagnosis not present

## 2017-12-01 DIAGNOSIS — E782 Mixed hyperlipidemia: Secondary | ICD-10-CM | POA: Diagnosis not present

## 2017-12-02 DIAGNOSIS — S83242A Other tear of medial meniscus, current injury, left knee, initial encounter: Secondary | ICD-10-CM | POA: Diagnosis not present

## 2017-12-02 LAB — COMPREHENSIVE METABOLIC PANEL
ALT: 16 IU/L (ref 0–44)
AST: 19 IU/L (ref 0–40)
Albumin/Globulin Ratio: 1.6 (ref 1.2–2.2)
Albumin: 4.3 g/dL (ref 3.5–4.8)
Alkaline Phosphatase: 50 IU/L (ref 39–117)
BUN/Creatinine Ratio: 12 (ref 10–24)
BUN: 15 mg/dL (ref 8–27)
Bilirubin Total: 0.5 mg/dL (ref 0.0–1.2)
CO2: 26 mmol/L (ref 20–29)
Calcium: 10.1 mg/dL (ref 8.6–10.2)
Chloride: 106 mmol/L (ref 96–106)
Creatinine, Ser: 1.26 mg/dL (ref 0.76–1.27)
GFR calc Af Amer: 66 mL/min/{1.73_m2} (ref >59)
GFR calc non Af Amer: 57 mL/min/{1.73_m2} — ABNORMAL LOW (ref >59)
Globulin, Total: 2.7 g/dL (ref 1.5–4.5)
Glucose: 142 mg/dL — ABNORMAL HIGH (ref 65–99)
Potassium: 4.5 mmol/L (ref 3.5–5.2)
Sodium: 142 mmol/L (ref 134–144)
Total Protein: 7 g/dL (ref 6.0–8.5)

## 2017-12-02 LAB — LIPID PANEL
Chol/HDL Ratio: 3 ratio (ref 0.0–5.0)
Cholesterol, Total: 116 mg/dL (ref 100–199)
HDL: 39 mg/dL — ABNORMAL LOW (ref >39)
LDL Calculated: 57 mg/dL (ref 0–99)
Triglycerides: 101 mg/dL (ref 0–149)
VLDL Cholesterol Cal: 20 mg/dL (ref 5–40)

## 2017-12-07 ENCOUNTER — Other Ambulatory Visit: Payer: Self-pay | Admitting: Cardiology

## 2017-12-08 ENCOUNTER — Encounter: Payer: Self-pay | Admitting: Cardiovascular Disease

## 2017-12-08 ENCOUNTER — Ambulatory Visit: Payer: PPO | Admitting: Cardiovascular Disease

## 2017-12-08 DIAGNOSIS — E785 Hyperlipidemia, unspecified: Secondary | ICD-10-CM

## 2017-12-08 DIAGNOSIS — Z9861 Coronary angioplasty status: Secondary | ICD-10-CM

## 2017-12-08 DIAGNOSIS — I739 Peripheral vascular disease, unspecified: Secondary | ICD-10-CM

## 2017-12-08 DIAGNOSIS — I10 Essential (primary) hypertension: Secondary | ICD-10-CM

## 2017-12-08 DIAGNOSIS — I2510 Atherosclerotic heart disease of native coronary artery without angina pectoris: Secondary | ICD-10-CM

## 2017-12-08 MED ORDER — METOPROLOL SUCCINATE ER 25 MG PO TB24: 50.0000 mg | ORAL_TABLET | Freq: Every day | ORAL | 6 refills | Status: DC

## 2017-12-08 NOTE — Patient Instructions (Signed)
Medication Instructions: Your physician recommends that you continue on your current medications as directed. Please refer to the Current Medication list given to you today.   Follow-Up: We request that you follow-up in: 6 months with Luke Kilroy, PA and in 12 months with Dr Berry  You will receive a reminder letter in the mail two months in advance. If you don't receive a letter, please call our office to schedule the follow-up appointment.  If you need a refill on your cardiac medications before your next appointment, please call your pharmacy.  

## 2017-12-08 NOTE — Assessment & Plan Note (Signed)
History of essential hypertension her blood pressure measured today at 128/64. He   Is on lisinopril and metoprolol.  Continue current meds at current dosing.

## 2017-12-08 NOTE — Progress Notes (Signed)
12/08/2017 [name]   1946/01/15  [id number]  Primary Physician [name], MD Primary Cardiologist: [name] MD FACP, [place], [place], [place]  HPI:  [name] is a 72 y.o.   mildly overweight married African American male father of 3 who I last saw in the 07/07/2017. He has a history of PVOD status post left SFA, PTA and stenting by myself back in July of 2006 with subsequent improvement in his claudication and Dopplers. These were last done in April of last year and showed ABIs of greater than 1 bilaterally. His other problems include hypertension, hyperlipidemia, non-insulin-requiring diabetes and statin intolerance. I catheterized him August 28, 2010 and stented his first OM branch with a bare metal stent. He was readmitted September 09, 2010 with recurrent chest pain and was re-cathed by Dr. Ellyn Hack via the right radial approach revealing a widely patent stent. He was admitted again June 29th through July 3rd with chest pain and ruled out for myocardial infarction. His stress test showed subtle lateral ischemia and cath performed by Dr. Claiborne Billings February 10, 2012 revealed 95% proximal in-stent restenosis within the OM stent which Dr. Claiborne Billings opened up with a cutting balloon. He had done well until December of last year when he was admitted again with chest pain. Dr. Ellyn Hack recathed him revealing again in-stent restenosis which was "restented" with a Promus drug-eluting stent. He was having chest pain when I saw him back in December, however, since that time he has had minimal chest pain and he does walk 3-5 miles a day. His most recent liver profile performed 08/22/15 revealed an LDL of 94 and HDL 47 on Zocor 20 mg a day. He was recently admitted with chest pain/rule out MI 08/30/13. This is either negative. 2-D echo was essentially unremarkable with mild inferoseptal hypokinesia and a Myoview stress test was read as low risk. His medications were adjusted. Ranexa was added..He has had no  recurrent chest pain since I saw him a year ago until recently when he was taking his Ranexa once a day instead of twice a day.Since I saw him a year ago he denies claudication but does complain of new onset chest pain several weeks ago which is exertional Recatheterized him revealing a totally occluded first obtuse marginal branch stent which I was unable to open.  The remainder of his coronary anatomy was free of significant disease and his LV function was normal.     Current Meds  Medication Sig  . acetaminophen (TYLENOL) 500 MG tablet Take 1,000 mg by mouth every 6 (six) hours as needed for mild pain.  Marland Kitchen aspirin 81 MG chewable tablet Chew 1 tablet (81 mg total) by mouth daily.  . clopidogrel (PLAVIX) 75 MG tablet TAKE ONE TABLET BY MOUTH ONCE DAILY.  Marland Kitchen Coenzyme Q10 (CO Q-10) 400 MG CAPS Take 400 mg by mouth daily.  . cyclobenzaprine (FLEXERIL) 10 MG tablet Take 10 mg by mouth 3 (three) times daily as needed for muscle spasms.  Marland Kitchen dextromethorphan-guaiFENesin (MUCINEX DM) 30-600 MG 12hr tablet Take 1 tablet by mouth 2 (two) times daily.  . fexofenadine (ALLEGRA) 180 MG tablet Take 180 mg by mouth daily.    . isosorbide mononitrate (IMDUR) 30 MG 24 hr tablet Take 30 mg by mouth daily.  Marland Kitchen lisinopril (PRINIVIL,ZESTRIL) 5 MG tablet Take 5 mg by mouth daily.  Marland Kitchen loratadine (CLARITIN) 10 MG tablet Take 10 mg by mouth daily.  . metoprolol succinate (TOPROL-XL) 25 MG 24 hr  tablet Take 2 tablets (50 mg total) by mouth daily.  . nitroGLYCERIN (NITROSTAT) 0.4 MG SL tablet Place 1 tablet (0.4 mg total) under the tongue every 5 (five) minutes as needed. For chest pain  . pantoprazole (PROTONIX) 40 MG tablet Take 40 mg by mouth daily.  . ranolazine (RANEXA) 500 MG 12 hr tablet Take 1 tablet (500 mg total) by mouth 2 (two) times daily.  . rosuvastatin (CRESTOR) 20 MG tablet TAKE 1 TABLET ONCE DAILY AT 6 P.M.  . sitaGLIPtin (JANUVIA) 25 MG tablet Take 25 mg by mouth daily.  . [DISCONTINUED]  glipiZIDE-metformin (METAGLIP) 5-500 MG per tablet Take 1 tablet by mouth every evening.  . [DISCONTINUED] metoprolol succinate (TOPROL-XL) 25 MG 24 hr tablet TAKE 2 TABLETS BY MOUTH ONCE DAILY.     Allergies  Allergen Reactions  . Propoxyphene N-Acetaminophen Nausea Only  . Statins Other (See Comments)    Severe muscle cramping/aching/pain  . Tape     Blisters  . Zetia [Ezetimibe]     Muscle cramping  . Flomax [Tamsulosin Hcl] Rash  . Levaquin [Levofloxacin Hemihydrate] Rash  . Penicillins Rash    Broke out in rash 6 years ago, pt recently took penicillin (09/2016) and had no reaction Has patient had a PCN reaction causing immediate rash, facial/tongue/throat swelling, SOB or lightheadedness with hypotension: Yes Has patient had a PCN reaction causing severe rash involving mucus membranes or skin necrosis: Unknown Has patient had a PCN reaction that required hospitalization: No Has patient had a PCN reaction occurring within the last 10 years: Yes If all of the above answers are "NO", then m    Social History   Socioeconomic History  . Marital status: Married    Spouse name: Not on file  . Number of children: 3  . Years of education: Not on file  . Highest education level: Not on file  Occupational History  . Occupation: Retired    Fish farm manager: [employer]  . Financial resource strain: Not on file  . Food insecurity:    Worry: Not on file    Inability: Not on file  . Transportation needs:    Medical: Not on file    Non-medical: Not on file  Tobacco Use  . Smoking status: Never Smoker  . Smokeless tobacco: Never Used  . Tobacco comment: quit 10 yrs ago  Substance and Sexual Activity  . Alcohol use: No  . Drug use: No  . Sexual activity: Never  Lifestyle  . Physical activity:    Days per week: Not on file    Minutes per session: Not on file  . Stress: Not on file  Relationships  . Social connections:    Talks on phone: Not on file    Gets  together: Not on file    Attends religious service: Not on file    Active member of club or organization: Not on file    Attends meetings of clubs or organizations: Not on file    Relationship status: Not on file  . Intimate partner violence:    Fear of current or ex partner: Not on file    Emotionally abused: Not on file    Physically abused: Not on file    Forced sexual activity: Not on file  Other Topics Concern  . Not on file  Social History Narrative  . Not on file     Review of Systems: General: negative for chills, fever, night sweats or weight changes.  Cardiovascular: negative  for chest pain, dyspnea on exertion, edema, orthopnea, palpitations, paroxysmal nocturnal dyspnea or shortness of breath Dermatological: negative for rash Respiratory: negative for cough or wheezing Urologic: negative for hematuria Abdominal: negative for nausea, vomiting, diarrhea, bright red blood per rectum, melena, or hematemesis Neurologic: negative for visual changes, syncope, or dizziness All other systems reviewed and are otherwise negative except as noted above.    Blood pressure 128/64, pulse (!) 58, height 6' (1.829 m), weight 212 lb (96.2 kg).  General appearance: alert and no distress Neck: no adenopathy, no carotid bruit, no JVD, supple, symmetrical, trachea midline and thyroid not enlarged, symmetric, no tenderness/mass/nodules Lungs: clear to auscultation bilaterally Heart: regular rate and rhythm, S1, S2 normal, no murmur, click, rub or gallop Extremities: extremities normal, atraumatic, no cyanosis or edema Pulses: 2+ and symmetric Skin: Skin color, texture, turgor normal. No rashes or lesions Neurologic: Alert and oriented X 3, normal strength and tone. Normal symmetric reflexes. Normal coordination and gait  EKG not performed today  ASSESSMENT AND PLAN:   Peripheral vascular disease ([place]) History of peripheral arterial disease status post SFA PTA and stenting by myself  July 2006 with improvement in his claudication and Doppler studies.  His last Dopplers performed 08/26/2017 revealed normal ABIs bilaterally with a widely patent left SFA  Dyslipidemia History of hyperlipidemia intolerant to statin therapy with recent lipid profile performed 12/01/2017 revealing LDL 57 and HDL of 39  Essential hypertension History of essential hypertension her blood pressure measured today at 128/64. He   Is on lisinopril and metoprolol.  Continue current meds at current dosing.  CAD S/P percutaneous coronary angioplasty History of CAD status post first obtuse marginal branch bare-metal stenting by myself 2012.  I recatheterized him 08/30/2017 and was unable to recanalize a subtotally occluded stent.  EF was normal and he had no other significant CAD.  He denies anginal chest pain.[name] MD FACP,FACC,FAHA, [facility] 12/08/2017 11:09 AM

## 2017-12-08 NOTE — Assessment & Plan Note (Signed)
History of CAD status post first obtuse marginal branch bare-metal stenting by myself 2012.  I recatheterized him 08/30/2017 and was unable to recanalize a subtotally occluded stent.  EF was normal and he had no other significant CAD.  He denies anginal chest pain.[name]

## 2017-12-08 NOTE — Assessment & Plan Note (Signed)
History of peripheral arterial disease status post SFA PTA and stenting by myself July 2006 with improvement in his claudication and Doppler studies.  His last Dopplers performed 08/26/2017 revealed normal ABIs bilaterally with a widely patent left SFA

## 2017-12-08 NOTE — Assessment & Plan Note (Signed)
History of hyperlipidemia intolerant to statin therapy with recent lipid profile performed 12/01/2017 revealing LDL 57 and HDL of 39

## 2018-02-22 DIAGNOSIS — E119 Type 2 diabetes mellitus without complications: Secondary | ICD-10-CM | POA: Diagnosis not present

## 2018-02-22 DIAGNOSIS — E785 Hyperlipidemia, unspecified: Secondary | ICD-10-CM | POA: Diagnosis not present

## 2018-02-22 DIAGNOSIS — I10 Essential (primary) hypertension: Secondary | ICD-10-CM | POA: Diagnosis not present

## 2018-03-15 ENCOUNTER — Other Ambulatory Visit: Payer: Self-pay | Admitting: Gastroenterology

## 2018-04-13 DIAGNOSIS — E1169 Type 2 diabetes mellitus with other specified complication: Secondary | ICD-10-CM | POA: Diagnosis not present

## 2018-04-13 DIAGNOSIS — I10 Essential (primary) hypertension: Secondary | ICD-10-CM | POA: Diagnosis not present

## 2018-04-13 DIAGNOSIS — E785 Hyperlipidemia, unspecified: Secondary | ICD-10-CM | POA: Diagnosis not present

## 2018-04-13 DIAGNOSIS — E559 Vitamin D deficiency, unspecified: Secondary | ICD-10-CM | POA: Diagnosis not present

## 2018-04-26 DIAGNOSIS — N410 Acute prostatitis: Secondary | ICD-10-CM | POA: Diagnosis not present

## 2018-05-11 ENCOUNTER — Ambulatory Visit (INDEPENDENT_AMBULATORY_CARE_PROVIDER_SITE_OTHER): Payer: PPO | Admitting: Urology

## 2018-05-11 DIAGNOSIS — R351 Nocturia: Secondary | ICD-10-CM

## 2018-05-11 DIAGNOSIS — N401 Enlarged prostate with lower urinary tract symptoms: Secondary | ICD-10-CM

## 2018-06-07 ENCOUNTER — Ambulatory Visit: Payer: PPO | Admitting: Cardiology

## 2018-06-07 ENCOUNTER — Encounter: Payer: Self-pay | Admitting: Cardiology

## 2018-06-07 VITALS — BP 123/71 | HR 56 | Ht 72.0 in | Wt 209.6 lb

## 2018-06-07 DIAGNOSIS — Z9861 Coronary angioplasty status: Secondary | ICD-10-CM | POA: Diagnosis not present

## 2018-06-07 DIAGNOSIS — E119 Type 2 diabetes mellitus without complications: Secondary | ICD-10-CM

## 2018-06-07 DIAGNOSIS — I2510 Atherosclerotic heart disease of native coronary artery without angina pectoris: Secondary | ICD-10-CM

## 2018-06-07 DIAGNOSIS — Z5181 Encounter for therapeutic drug level monitoring: Secondary | ICD-10-CM

## 2018-06-07 DIAGNOSIS — I739 Peripheral vascular disease, unspecified: Secondary | ICD-10-CM | POA: Diagnosis not present

## 2018-06-07 DIAGNOSIS — E785 Hyperlipidemia, unspecified: Secondary | ICD-10-CM | POA: Diagnosis not present

## 2018-06-07 DIAGNOSIS — I10 Essential (primary) hypertension: Secondary | ICD-10-CM

## 2018-06-07 LAB — CBC WITH DIFFERENTIAL/PLATELET
Basophils Absolute: 0 10*3/uL (ref 0.0–0.2)
Basos: 0 %
EOS (ABSOLUTE): 0 10*3/uL (ref 0.0–0.4)
Eos: 1 %
Hematocrit: 38.3 % (ref 37.5–51.0)
Hemoglobin: 13.1 g/dL (ref 13.0–17.7)
Immature Grans (Abs): 0 10*3/uL (ref 0.0–0.1)
Immature Granulocytes: 0 %
Lymphocytes Absolute: 1.9 10*3/uL (ref 0.7–3.1)
Lymphs: 40 %
MCH: 32.7 pg (ref 26.6–33.0)
MCHC: 34.2 g/dL (ref 31.5–35.7)
MCV: 96 fL (ref 79–97)
Monocytes Absolute: 0.3 10*3/uL (ref 0.1–0.9)
Monocytes: 6 %
Neutrophils Absolute: 2.6 10*3/uL (ref 1.4–7.0)
Neutrophils: 53 %
Platelets: 153 10*3/uL (ref 150–450)
RBC: 4.01 x10E6/uL — ABNORMAL LOW (ref 4.14–5.80)
RDW: 12.5 % (ref 12.3–15.4)
WBC: 4.9 10*3/uL (ref 3.4–10.8)

## 2018-06-07 LAB — BASIC METABOLIC PANEL
BUN/Creatinine Ratio: 14 (ref 10–24)
BUN: 15 mg/dL (ref 8–27)
CO2: 25 mmol/L (ref 20–29)
Calcium: 10.1 mg/dL (ref 8.6–10.2)
Chloride: 102 mmol/L (ref 96–106)
Creatinine, Ser: 1.11 mg/dL (ref 0.76–1.27)
GFR calc Af Amer: 77 mL/min/{1.73_m2} (ref >59)
GFR calc non Af Amer: 67 mL/min/{1.73_m2} (ref >59)
Glucose: 172 mg/dL — ABNORMAL HIGH (ref 65–99)
Potassium: 4.4 mmol/L (ref 3.5–5.2)
Sodium: 139 mmol/L (ref 134–144)

## 2018-06-07 NOTE — Assessment & Plan Note (Signed)
Lt SFA PTA 2006, dopplers OK jan 2019

## 2018-06-07 NOTE — Addendum Note (Signed)
Addended by: Alvina Filbert B on: 06/07/2018 10:55 AM   Modules accepted: Orders

## 2018-06-07 NOTE — Progress Notes (Signed)
06/07/2018 [name]   06/23/1946  [id number]  Primary Physician [name], MD Primary Cardiologist: Dr Gwenlyn Found  HPI:  72 y/o AA male followed by Dr Gwenlyn Found with a history of CAD, PVD, DM, HTN, and HLD, admitted to [facility] 08/29/17 with SSCP and ruled in for NSTEMI. His Troponin peaked at 8.8. The pt has known CAD and has had previous OM PCI in 2012 (three different times). Cath done 08/30/17 revealed ISR of the OM1 with no other significant CAD. His EF was 50-55%. He underwent an unsuccessful attempt at PCI by Dr Gwenlyn Found and the plan is for medical Rx.  After he was seen in the office in February complained of some chest pain.  He was put back on Ranexa 500 mg, he only takes this once a day.  Since then he has been doing well.  He walks on a treadmill 40 to 50 minutes 4 to 5 days a week.  He plays golf a couple times a week as well.  He has not had chest pain.  He has noted an irregular heart rate on his monitor from time to time but he has no complaints of tachycardia or palpitations.  He is tolerating his statin well and his LDL was at goal when checked in April.    Current Outpatient Medications  Medication Sig Dispense Refill  . acetaminophen (TYLENOL) 500 MG tablet Take 1,000 mg by mouth every 6 (six) hours as needed for mild pain.    Marland Kitchen aspirin 81 MG chewable tablet Chew 1 tablet (81 mg total) by mouth daily.    . clopidogrel (PLAVIX) 75 MG tablet TAKE ONE TABLET BY MOUTH ONCE DAILY. 30 tablet 9  . Coenzyme Q10 (CO Q-10) 400 MG CAPS Take 400 mg by mouth daily.    . cyclobenzaprine (FLEXERIL) 10 MG tablet Take 10 mg by mouth 3 (three) times daily as needed for muscle spasms.    . DEXILANT 60 MG capsule TAKE 1 CAPSULE ONCE DAILY BEFORE BREAKFAST. 30 capsule 3  . dextromethorphan-guaiFENesin (MUCINEX DM) 30-600 MG 12hr tablet Take 1 tablet by mouth 2 (two) times daily.    . fexofenadine (ALLEGRA) 180 MG tablet Take 180 mg by mouth daily.      Marland Kitchen glipiZIDE-metformin (METAGLIP) 5-500 MG  tablet Take 1 tablet by mouth daily.    . isosorbide mononitrate (IMDUR) 30 MG 24 hr tablet Take 30 mg by mouth daily.    Marland Kitchen lisinopril (PRINIVIL,ZESTRIL) 5 MG tablet Take 5 mg by mouth daily.    Marland Kitchen loratadine (CLARITIN) 10 MG tablet Take 10 mg by mouth daily.    . metoprolol succinate (TOPROL-XL) 25 MG 24 hr tablet Take 2 tablets (50 mg total) by mouth daily. 60 tablet 6  . nitroGLYCERIN (NITROSTAT) 0.4 MG SL tablet Place 1 tablet (0.4 mg total) under the tongue every 5 (five) minutes as needed. For chest pain 25 tablet 11  . pantoprazole (PROTONIX) 40 MG tablet Take 40 mg by mouth daily.    . ranolazine (RANEXA) 500 MG 12 hr tablet Take 500 mg by mouth daily.    . rosuvastatin (CRESTOR) 20 MG tablet TAKE 1 TABLET ONCE DAILY AT 6 P.M. 30 tablet 10   No current facility-administered medications for this visit.     Allergies  Allergen Reactions  . Propoxyphene N-Acetaminophen Nausea Only  . Statins Other (See Comments)    Severe muscle cramping/aching/pain  . Tape     Blisters  . Zetia [Ezetimibe]  Muscle cramping  . Flomax [Tamsulosin Hcl] Rash  . Levaquin [Levofloxacin Hemihydrate] Rash  . Penicillins Rash    Broke out in rash 6 years ago, pt recently took penicillin (09/2016) and had no reaction Has patient had a PCN reaction causing immediate rash, facial/tongue/throat swelling, SOB or lightheadedness with hypotension: Yes Has patient had a PCN reaction causing severe rash involving mucus membranes or skin necrosis: Unknown Has patient had a PCN reaction that required hospitalization: No Has patient had a PCN reaction occurring within the last 10 years: Yes If all of the above answers are "NO", then m    Past Medical History:  Diagnosis Date  . CHF (congestive heart failure) ([place])   . Coronary artery disease    s/p multiple caths 2012, stenting 1/19 NSTEMI with occlusion of OM, unable to wire-->Rx therapy, normal EF  . Diabetes mellitus   . GERD (gastroesophageal reflux  disease)   . Hyperlipidemia   . Hypertension   . PVD (peripheral vascular disease) ([place])    left SFA PTA & stenting in 02/2005 (Dr. Adora Fridge)    Social History   Socioeconomic History  . Marital status: Married    Spouse name: Not on file  . Number of children: 3  . Years of education: Not on file  . Highest education level: Not on file  Occupational History  . Occupation: Retired    Fish farm manager: [employer]  . Financial resource strain: Not on file  . Food insecurity:    Worry: Not on file    Inability: Not on file  . Transportation needs:    Medical: Not on file    Non-medical: Not on file  Tobacco Use  . Smoking status: Never Smoker  . Smokeless tobacco: Never Used  . Tobacco comment: quit 10 yrs ago  Substance and Sexual Activity  . Alcohol use: No  . Drug use: No  . Sexual activity: Never  Lifestyle  . Physical activity:    Days per week: Not on file    Minutes per session: Not on file  . Stress: Not on file  Relationships  . Social connections:    Talks on phone: Not on file    Gets together: Not on file    Attends religious service: Not on file    Active member of club or organization: Not on file    Attends meetings of clubs or organizations: Not on file    Relationship status: Not on file  . Intimate partner violence:    Fear of current or ex partner: Not on file    Emotionally abused: Not on file    Physically abused: Not on file    Forced sexual activity: Not on file  Other Topics Concern  . Not on file  Social History Narrative  . Not on file     Family History  Problem Relation Age of Onset  . Arrhythmia Mother 68  . Colon cancer Neg Hx   . Liver disease Neg Hx   . Inflammatory bowel disease Neg Hx   . Colon polyps Neg Hx      Review of Systems: General: negative for chills, fever, night sweats or weight changes.  Cardiovascular: negative for chest pain, dyspnea on exertion, edema, orthopnea, palpitations, paroxysmal  nocturnal dyspnea or shortness of breath Dermatological: negative for rash Respiratory: negative for cough or wheezing Urologic: negative for hematuria Abdominal: negative for nausea, vomiting, diarrhea, bright red blood per rectum, melena, or hematemesis Neurologic: negative  for visual changes, syncope, or dizziness All other systems reviewed and are otherwise negative except as noted above.    Blood pressure 123/71, pulse (!) 56, height 6' (1.829 m), weight 209 lb 9.6 oz (95.1 kg).  General appearance: alert, cooperative and no distress Neck: no carotid bruit and no JVD Lungs: clear to auscultation bilaterally Heart: regular rate and rhythm Extremities: extremities normal, atraumatic, no cyanosis or edema Skin: Skin color, texture, turgor normal. No rashes or lesions Neurologic: Grossly normal  EKG [id number]  ASSESSMENT AND PLAN:   CAD S/P percutaneous coronary angioplasty Om1 BMS [id number], ISR July 2012 Rx'd with cutting balloon, ISR again Dec 2012 Rx'd with DES. finally noted to be occluded with unsuccessful PCI jan 2019. No significant residual disease. Normal LVF.   Dyslipidemia LDL 57 on Crestor 20 mg   Peripheral vascular disease ([place]) Lt SFA PTA 2006, dopplers OK jan 2019   Essential hypertension controlled  Non-insulin treated type 2 diabetes mellitus (HCC) Type II on oral agents   PLAN  Same Rx - check labs (f/u WBC and SCr from April which were slightly abnormal). F/U dr Gwenlyn Found in 6 months.   Kerin Ransom PA-C 06/07/2018 10:31 AM

## 2018-06-07 NOTE — Patient Instructions (Signed)
Medication Instructions:  Your physician recommends that you continue on your current medications as directed. Please refer to the Current Medication list given to you today.  If you need a refill on your cardiac medications before your next appointment, please call your pharmacy.   Lab work: BMET/CBC TODAY  If you have labs (blood work) drawn today and your tests are completely normal, you will receive your results only by: Marland Kitchen MyChart Message (if you have MyChart) OR . A paper copy in the mail If you have any lab test that is abnormal or we need to change your treatment, we will call you to review the results.  Testing/Procedures: NONE  Follow-Up: At [facility], you and your health needs are our priority.  As part of our continuing mission to provide you with exceptional heart care, we have created designated Provider Care Teams.  These Care Teams include your primary Cardiologist (physician) and Advanced Practice Providers (APPs -  Physician Assistants and Nurse Practitioners) who all work together to provide you with the care you need, when you need it. You will need a follow up appointment in 6 months.  Please call our office 2 months in advance to schedule this appointment.  You may see DR Gwenlyn Found  or one of the following Advanced Practice Providers on your designated Care Team:   Kerin Ransom, PA-C Roby Lofts, [place] . Sande Rives, PA-C

## 2018-06-07 NOTE — Assessment & Plan Note (Signed)
LDL 57 on Crestor 20 mg

## 2018-06-07 NOTE — Assessment & Plan Note (Signed)
controlled 

## 2018-06-07 NOTE — Assessment & Plan Note (Signed)
Om1 BMS [id number], ISR July 2012 Rx'd with cutting balloon, ISR again Dec 2012 Rx'd with DES. finally noted to be occluded with unsuccessful PCI jan 2019. No significant residual disease. Normal LVF.

## 2018-06-07 NOTE — Assessment & Plan Note (Signed)
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 

## 2018-07-27 DIAGNOSIS — H40013 Open angle with borderline findings, low risk, bilateral: Secondary | ICD-10-CM | POA: Diagnosis not present

## 2018-07-27 DIAGNOSIS — E1169 Type 2 diabetes mellitus with other specified complication: Secondary | ICD-10-CM | POA: Diagnosis not present

## 2018-07-27 DIAGNOSIS — I10 Essential (primary) hypertension: Secondary | ICD-10-CM | POA: Diagnosis not present

## 2018-07-27 DIAGNOSIS — H2513 Age-related nuclear cataract, bilateral: Secondary | ICD-10-CM | POA: Diagnosis not present

## 2018-07-27 DIAGNOSIS — E119 Type 2 diabetes mellitus without complications: Secondary | ICD-10-CM | POA: Diagnosis not present

## 2018-07-27 DIAGNOSIS — E785 Hyperlipidemia, unspecified: Secondary | ICD-10-CM | POA: Diagnosis not present

## 2018-07-27 DIAGNOSIS — E1165 Type 2 diabetes mellitus with hyperglycemia: Secondary | ICD-10-CM | POA: Diagnosis not present

## 2018-08-26 ENCOUNTER — Ambulatory Visit (HOSPITAL_COMMUNITY)
Admission: RE | Admit: 2018-08-26 | Discharge: 2018-08-26 | Disposition: A | Discharge status: Home / Self Care | Payer: PPO | Source: Ambulatory Visit | Attending: Cardiology | Admitting: Cardiology

## 2018-08-26 DIAGNOSIS — I739 Peripheral vascular disease, unspecified: Secondary | ICD-10-CM | POA: Diagnosis not present

## 2018-08-31 ENCOUNTER — Other Ambulatory Visit: Payer: Self-pay | Admitting: *Deleted

## 2018-08-31 DIAGNOSIS — I739 Peripheral vascular disease, unspecified: Secondary | ICD-10-CM

## 2018-08-31 NOTE — Progress Notes (Signed)
vas 

## 2018-09-10 ENCOUNTER — Other Ambulatory Visit: Payer: Self-pay | Admitting: Cardiovascular Disease

## 2018-09-10 NOTE — Telephone Encounter (Signed)
Rx(s) sent to pharmacy electronically.  

## 2018-10-27 DIAGNOSIS — I10 Essential (primary) hypertension: Secondary | ICD-10-CM | POA: Diagnosis not present

## 2018-10-27 DIAGNOSIS — E785 Hyperlipidemia, unspecified: Secondary | ICD-10-CM | POA: Diagnosis not present

## 2018-10-27 DIAGNOSIS — E1169 Type 2 diabetes mellitus with other specified complication: Secondary | ICD-10-CM | POA: Diagnosis not present

## 2018-12-07 ENCOUNTER — Ambulatory Visit: Payer: PPO | Admitting: Cardiovascular Disease

## 2019-01-07 IMAGING — DX DG FEMUR 2+V*L*
4 series · 4 of 4 positions shown · non-contrast
Comparison: None.

CLINICAL DATA: Patient with leg injury from hydraulic Dregg
Davy. Left leg pain. Initial encounter.

EXAM:
LEFT FEMUR 2 VIEWS

[femur ap (1 of 2)]
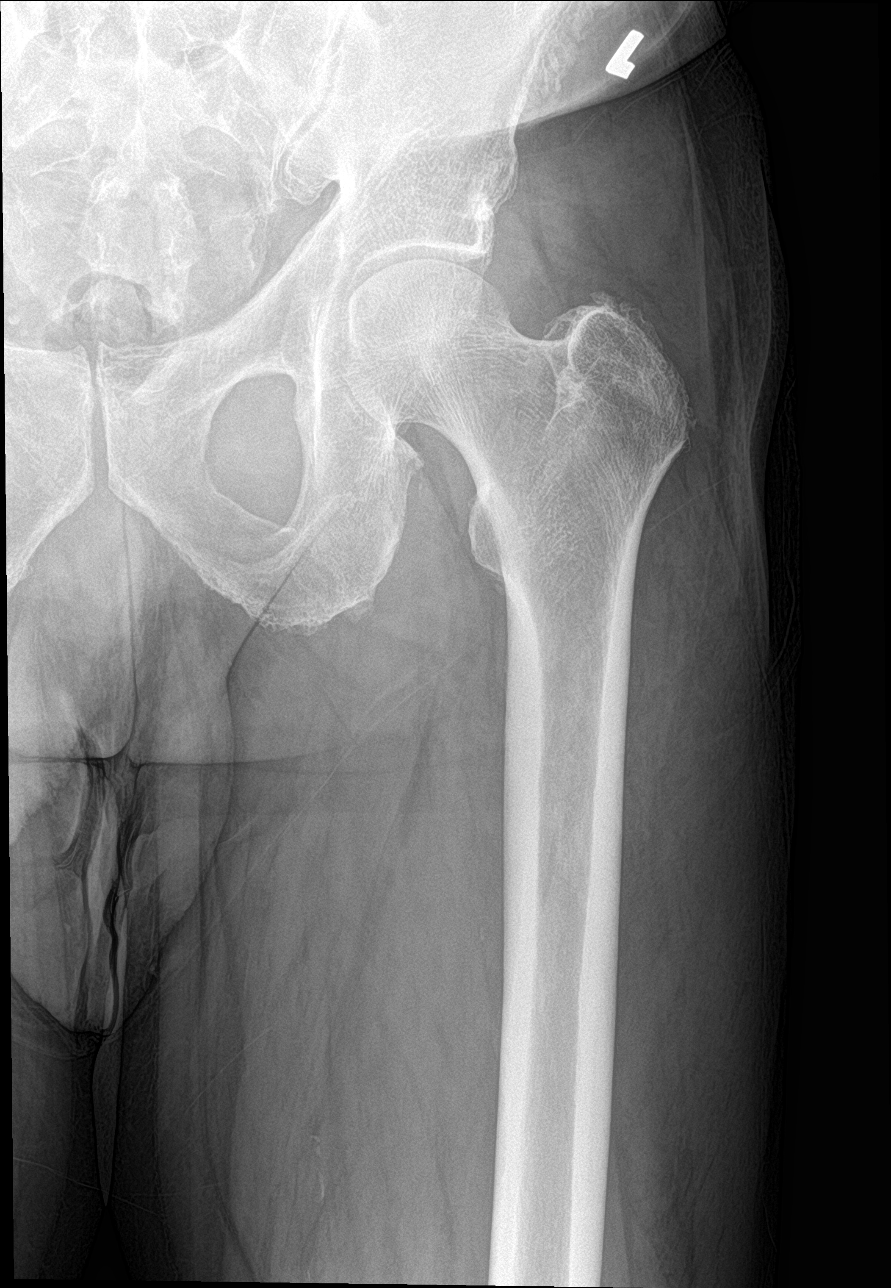

[femur ap (2 of 2)]
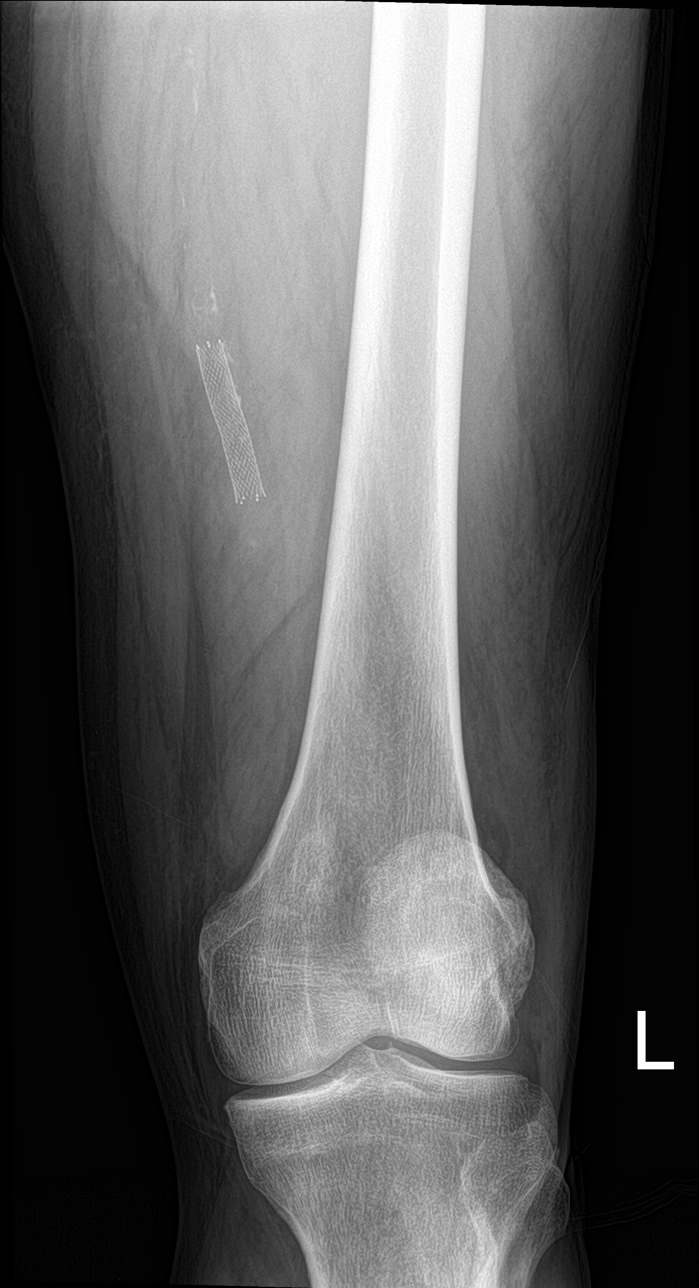

[femur lat (1 of 2)]
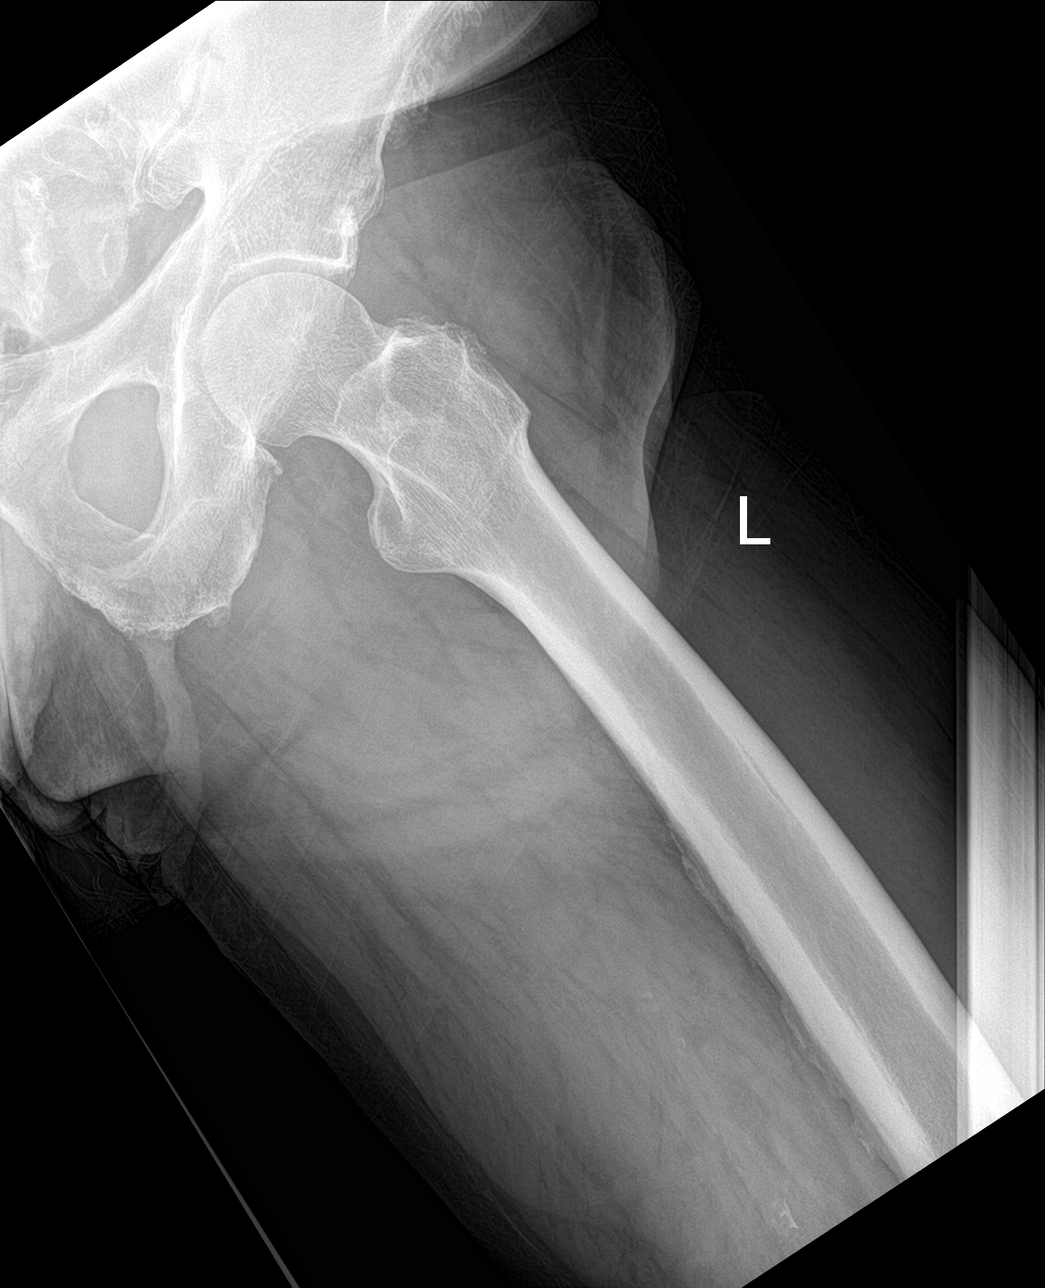

[femur lat (2 of 2)]
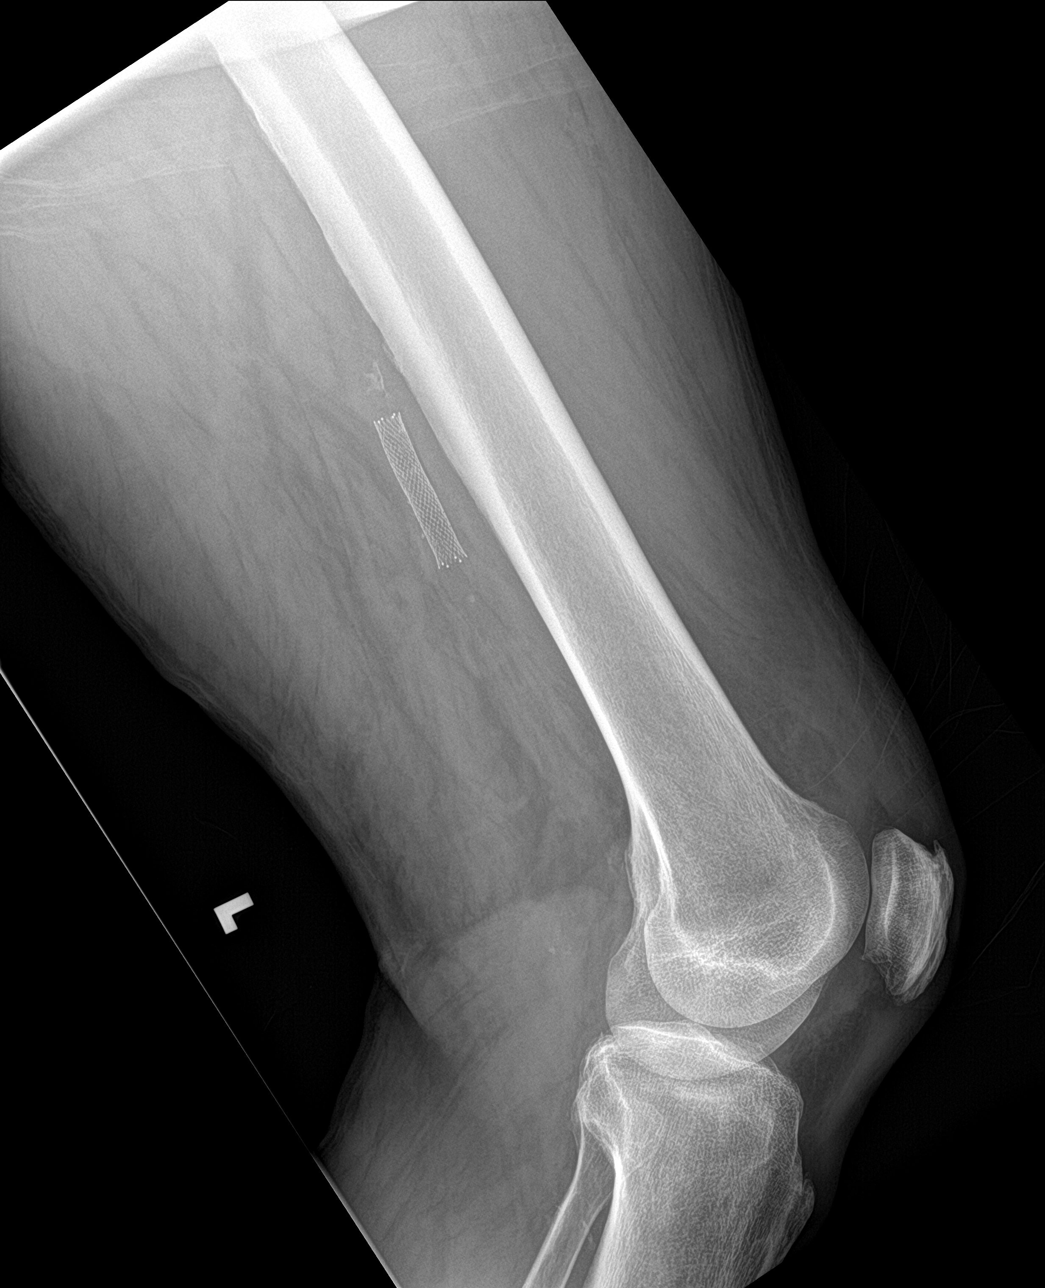

[4 of 4 positions shown; findings below may reference images not displayed]

FINDINGS: Normal anatomic alignment. No evidence for acute fracture or
dislocation. Stent graft material within the soft tissues.
IMPRESSION: No acute osseous abnormality.

## 2019-01-19 ENCOUNTER — Other Ambulatory Visit: Payer: Self-pay | Admitting: Gastroenterology

## 2019-01-19 ENCOUNTER — Other Ambulatory Visit: Payer: Self-pay | Admitting: Cardiovascular Disease

## 2019-01-31 ENCOUNTER — Ambulatory Visit (INDEPENDENT_AMBULATORY_CARE_PROVIDER_SITE_OTHER): Payer: Self-pay | Admitting: Internal Medicine

## 2019-01-31 DIAGNOSIS — E1169 Type 2 diabetes mellitus with other specified complication: Secondary | ICD-10-CM | POA: Diagnosis not present

## 2019-01-31 DIAGNOSIS — E785 Hyperlipidemia, unspecified: Secondary | ICD-10-CM | POA: Diagnosis not present

## 2019-01-31 DIAGNOSIS — E559 Vitamin D deficiency, unspecified: Secondary | ICD-10-CM | POA: Diagnosis not present

## 2019-01-31 DIAGNOSIS — I10 Essential (primary) hypertension: Secondary | ICD-10-CM | POA: Diagnosis not present

## 2019-03-08 ENCOUNTER — Other Ambulatory Visit: Payer: Self-pay | Admitting: Cardiovascular Disease

## 2019-03-11 ENCOUNTER — Other Ambulatory Visit: Payer: Self-pay

## 2019-03-11 ENCOUNTER — Telehealth: Payer: Self-pay | Admitting: *Deleted

## 2019-03-11 DIAGNOSIS — Z20822 Contact with and (suspected) exposure to covid-19: Secondary | ICD-10-CM

## 2019-03-11 DIAGNOSIS — R6889 Other general symptoms and signs: Secondary | ICD-10-CM | POA: Diagnosis not present

## 2019-03-11 NOTE — Telephone Encounter (Signed)
    COVID-19 Pre-Screening Questions:  . In the past 7 to 10 days have you had a cough,  shortness of breath, headache, congestion, fever (100 or greater) body aches, chills, sore throat, or sudden loss of taste or sense of smell? NO . Have you been around anyone with known Covid 19. NO . Have you been around anyone who is awaiting Covid 19 test results in the past 7 to 10 days?YES patient was just tested himself today! . Have you been around anyone who has been exposed to Covid 19, or has mentioned symptoms of Covid 19 within the past 7 to 10 days? NO  If you have any concerns/questions about symptoms patients report during screening (either on the phone or at threshold). Contact the provider seeing the patient or DOD for further guidance.  If neither are available contact a member of the leadership team.

## 2019-03-13 LAB — NOVEL CORONAVIRUS, NAA: SARS-CoV-2, NAA: NOT DETECTED

## 2019-03-16 ENCOUNTER — Ambulatory Visit: Payer: PPO | Admitting: Cardiovascular Disease

## 2019-03-21 NOTE — Progress Notes (Signed)
Cardiology Office Note  Date:  03/22/2019   ID:  [name], DOB 1946/07/31, MRN [id number]  PCP:  [name], MD  Cardiologist:  Avelina Laine  CC: Follow Up    History of Present Illness: [name] is a 73 y.o. male who presents for ongoing assessment and management of coronary artery disease, with history of PCI to the OM in 2012 (3 separate times), repeat cath in 08/30/2017 revealed ISR of the OM but no other significant CAD, EF was 50% to 55%.  An unsuccessful attempt at PCI was undertaken and medical therapy was recommended.  Other history includes PVD, status post left SFA PTA in 2006 with repeat Doppler studies okay in 2020,, diabetes, hypertension, and hyperlipidemia.  The patient has chronic chest discomfort and is on Ranexa 500 mg daily.  He is physically active walking on a treadmill for 40 to 50 minutes 4 to 5 days a week, also playing golf.  Last seen by Kerin Ransom, PA in October 2019.  The patient was stable from a cardiac standpoint.  He comes today without complaints. He remains active, walking on a treadmill 3-4 miles daily, plays golf 3 times a week. He denies chest pressure,DOE, or leg pain.    Past Medical History:  Diagnosis Date  . CHF (congestive heart failure) ([place])   . Coronary artery disease    s/p multiple caths 2012, stenting 1/19 NSTEMI with occlusion of OM, unable to wire-->Rx therapy, normal EF  . Diabetes mellitus   . GERD (gastroesophageal reflux disease)   . Hyperlipidemia   . Hypertension   . PVD (peripheral vascular disease) ([place])    left SFA PTA & stenting in 02/2005 (Dr. Adora Fridge)    Past Surgical History:  Procedure Laterality Date  . CARDIAC CATHETERIZATION  12/23/2004   normal L main, normal LAD, normal L Cfx, RCA with 20% hypodense lesion in first end of vessel (Dr. Adora Fridge)  . CARDIAC CATHETERIZATION  08/26/2007   no significant CAD by cath, EF 50% (Dr. Jackie Plum)  . CARDIAC CATHETERIZATION  08/28/2010   stent to OM1 with 2.0x65mm  BMS (Dr. Adora Fridge)  . CARDIAC CATHETERIZATION  09/11/2010   patent stent (Dr. Roni Bread)  . CARDIAC CATHETERIZATION  02/10/2011   95% prox in-stent restenosis within OM stent - opened with cutting balloon (Dr. Corky Downs)  . CARDIAC CATHETERIZATION  07/17/2011   in-stent restenosis - re-stented with Promus 2.25x86mm DES (Dr. Roni Bread)  . COLONOSCOPY  12/2009   Dr. Hampton Abbot  . COLONOSCOPY N/A 01/30/2017   Procedure: COLONOSCOPY;  Surgeon: Daneil Dolin, MD;  [facility];  Service: Endoscopy;  Laterality: N/A;  8:15am  . CORONARY BALLOON ANGIOPLASTY N/A 08/30/2017   Procedure: CORONARY BALLOON ANGIOPLASTY;  Surgeon: [name], MD;  Location: [facility];  Service: Cardiovascular;  Laterality: N/A;  . 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 FEMORAL ARTERY STENT  02/27/2005   L SFA stenting - Wholey down SFA across lesion - predilatation with 4x4 Powerflex, stenting with 7x4 Smart, post-dilatation with 6x4 powerflex (Dr. Adora Fridge)  . LEFT HEART CATH AND CORONARY ANGIOGRAPHY N/A 08/30/2017   Procedure: LEFT HEART CATH AND CORONARY ANGIOGRAPHY;  Surgeon: [name], MD;  Location: [facility];  Service: Cardiovascular;  Laterality: N/A;  . LEFT HEART CATHETERIZATION WITH CORONARY ANGIOGRAM N/A 07/17/2011   Procedure: LEFT HEART CATHETERIZATION WITH CORONARY  Cyril Loosen;  Surgeon: Leonie Man, MD;  Location: [facility] CATH LAB;  Service: Cardiovascular;  Laterality: N/A;  Right radial approach  . left knee arthroscopy  05/2016  . NM MYOCAR PERF WALL MOTION  09/08/2013   abnormal lexiscan - low to intermediate risk;   . TRANSTHORACIC ECHOCARDIOGRAM  09/08/2013   EF 50-55%, mild LVH, grade 1 diastolic dysfunction, mildly calcified AV annulus, calcified MV, LA mildly dilated,      Current Outpatient Medications  Medication Sig Dispense Refill  .  acetaminophen (TYLENOL) 500 MG tablet Take 1,000 mg by mouth every 6 (six) hours as needed for mild pain.    Marland Kitchen aspirin 81 MG chewable tablet Chew 1 tablet (81 mg total) by mouth daily.    . clopidogrel (PLAVIX) 75 MG tablet TAKE ONE TABLET BY MOUTH ONCE DAILY. 30 tablet 9  . Coenzyme Q10 (CO Q-10) 400 MG CAPS Take 400 mg by mouth daily.    . cyclobenzaprine (FLEXERIL) 10 MG tablet Take 10 mg by mouth 3 (three) times daily as needed for muscle spasms.    . DEXILANT 60 MG capsule TAKE 1 CAPSULE ONCE DAILY BEFORE BREAKFAST. 30 capsule 5  . dextromethorphan-guaiFENesin (MUCINEX DM) 30-600 MG 12hr tablet Take 1 tablet by mouth 2 (two) times daily.    . fexofenadine (ALLEGRA) 180 MG tablet Take 180 mg by mouth daily.      Marland Kitchen glipiZIDE-metformin (METAGLIP) 5-500 MG tablet Take 1 tablet by mouth every other day.     . isosorbide mononitrate (IMDUR) 30 MG 24 hr tablet Take 30 mg by mouth daily.    Marland Kitchen lisinopril (PRINIVIL,ZESTRIL) 5 MG tablet Take 5 mg by mouth daily.    Marland Kitchen loratadine (CLARITIN) 10 MG tablet Take 10 mg by mouth daily.    . metoprolol succinate (TOPROL-XL) 25 MG 24 hr tablet TAKE 2 TABLETS BY MOUTH ONCE DAILY. 180 tablet 0  . nitroGLYCERIN (NITROSTAT) 0.4 MG SL tablet Place 1 tablet (0.4 mg total) under the tongue every 5 (five) minutes as needed. For chest pain 25 tablet 11  . pantoprazole (PROTONIX) 40 MG tablet Take 40 mg by mouth daily.    . ranolazine (RANEXA) 500 MG 12 hr tablet Take 500 mg by mouth daily.    . rosuvastatin (CRESTOR) 20 MG tablet TAKE 1 TABLET ONCE DAILY AT 6 P.M. 90 tablet 0   No current facility-administered medications for this visit.     Allergies:   Propoxyphene n-acetaminophen, Statins, Tape, Zetia [ezetimibe], Flomax [tamsulosin hcl], Levaquin [levofloxacin hemihydrate], and Penicillins    Social History:  The patient  reports that he has never smoked. He has never used smokeless tobacco. He reports that he does not drink alcohol or use drugs.   Family  History:  The patient's family history includes Arrhythmia (age of onset: 15) in his mother.    ROS: All other systems are reviewed and negative. Unless otherwise mentioned in H&P    PHYSICAL EXAM: VS:  BP 135/69   Pulse (!) 53   Temp 99.1 F (37.3 C)   Ht 6' (1.829 m)   Wt 210 lb (95.3 kg)   SpO2 92%   BMI 28.48 kg/m  , BMI Body mass index is 28.48 kg/m. GEN: Well nourished, well developed, in no acute distress HEENT: normal Neck: no JVD, carotid bruits, or masses Cardiac:RRR; no murmurs, rubs, or gallops,no edema  Respiratory:  Clear to auscultation bilaterally, normal work of breathing GI: soft, nontender, nondistended, + BS MS: no deformity or atrophy Skin:  warm and dry, no rash Neuro:  Strength and sensation are intact Psych: euthymic mood, full affect   EKG:  Not completed this office   Recent Labs: 06/07/2018: BUN 15; Creatinine, Ser 1.11; Hemoglobin 13.1; Platelets 153; Potassium 4.4; Sodium 139    Lipid Panel    Component Value Date/Time   CHOL 116 12/01/2017 1014   TRIG 101 12/01/2017 1014   HDL 39 (L) 12/01/2017 1014   CHOLHDL 3.0 12/01/2017 1014   CHOLHDL 5.3 08/30/2017 0315   VLDL 27 08/30/2017 0315   LDLCALC 57 12/01/2017 1014   LDLDIRECT 66 04/13/2013 0732      Wt Readings from Last 3 Encounters:  03/22/19 210 lb (95.3 kg)  06/07/18 209 lb 9.6 oz (95.1 kg)  12/08/17 212 lb (96.2 kg)      Other studies Reviewed: ABI 2020 Right: Resting right ankle-brachial index is within normal range. No evidence of significant right lower extremity arterial disease. The right toe-brachial index is normal.  Left: Resting left ankle-brachial index is within normal range. No evidence of significant left lower extremity arterial disease. The left toe-brachial index is normal.  Left Heart Cath 08/30/2017  Ost 1st Mrg to 1st Mrg lesion is 100% stenosed.  Post intervention, there is a 100% residual stenosis.  The left ventricular systolic function is  normal.  LV end diastolic pressure is normal.  The left ventricular ejection fraction is 50-55% by visual estimate.  ASSESSMENT AND PLAN:  1. CAD: History of PCI to the OM in 2012, with repeat cath in January 2019 with in-stent restenosis of the OM but no other significant CAD, continued on medical management.  He remains active medically compliant and is without complaints today.  We will check follow-up labs.  2.  Hyperlipidemia: We will plan fasting lipids and LFTs today as they have not been checked since January.  Goal of LDL less than 70, continue rosuvastatin 20 mg daily as directed.  3.  Hypertension: Excellent control of blood pressure on current regimen.  We will continue lisinopril 5 mg daily isosorbide 30 mg daily.  Follow-up BMET will be ordered.  4. PAD: He remains very active, walking daily and playing golf without any complaints of leg pain.  Pulses are strong.  No further evaluation concerning PAD at this time unless he is symptomatic.  Current medicines are reviewed at length with the patient today.    Labs/ tests ordered today include: Fasting Lipids and LFTs, BMET and CBC.   Phill Myron. West Pugh, ANP, AACC   03/22/2019 8:12 AM    [address] Office [phone] Fax [phone]

## 2019-03-22 ENCOUNTER — Encounter: Payer: Self-pay | Admitting: Adult Health

## 2019-03-22 ENCOUNTER — Ambulatory Visit: Payer: PPO | Admitting: Adult Health

## 2019-03-22 ENCOUNTER — Other Ambulatory Visit: Payer: Self-pay

## 2019-03-22 VITALS — BP 135/69 | HR 53 | Temp 99.1°F | Ht 72.0 in | Wt 210.0 lb

## 2019-03-22 DIAGNOSIS — Z79899 Other long term (current) drug therapy: Secondary | ICD-10-CM | POA: Diagnosis not present

## 2019-03-22 DIAGNOSIS — I2510 Atherosclerotic heart disease of native coronary artery without angina pectoris: Secondary | ICD-10-CM | POA: Diagnosis not present

## 2019-03-22 DIAGNOSIS — I739 Peripheral vascular disease, unspecified: Secondary | ICD-10-CM

## 2019-03-22 DIAGNOSIS — E785 Hyperlipidemia, unspecified: Secondary | ICD-10-CM | POA: Diagnosis not present

## 2019-03-22 DIAGNOSIS — Z955 Presence of coronary angioplasty implant and graft: Secondary | ICD-10-CM | POA: Diagnosis not present

## 2019-03-22 LAB — BASIC METABOLIC PANEL
BUN/Creatinine Ratio: 16 (ref 10–24)
BUN: 19 mg/dL (ref 8–27)
CO2: 22 mmol/L (ref 20–29)
Calcium: 10 mg/dL (ref 8.6–10.2)
Chloride: 107 mmol/L — ABNORMAL HIGH (ref 96–106)
Creatinine, Ser: 1.16 mg/dL (ref 0.76–1.27)
GFR calc Af Amer: 73 mL/min/{1.73_m2} (ref >59)
GFR calc non Af Amer: 63 mL/min/{1.73_m2} (ref >59)
Glucose: 100 mg/dL — ABNORMAL HIGH (ref 65–99)
Potassium: 4.8 mmol/L (ref 3.5–5.2)
Sodium: 142 mmol/L (ref 134–144)

## 2019-03-22 LAB — HEPATIC FUNCTION PANEL
ALT: 15 IU/L (ref 0–44)
AST: 18 IU/L (ref 0–40)
Albumin: 4 g/dL (ref 3.7–4.7)
Alkaline Phosphatase: 52 IU/L (ref 39–117)
Bilirubin Total: 1 mg/dL (ref 0.0–1.2)
Bilirubin, Direct: 0.27 mg/dL (ref 0.00–0.40)
Total Protein: 6.6 g/dL (ref 6.0–8.5)

## 2019-03-22 LAB — CBC
Hematocrit: 36.1 % — ABNORMAL LOW (ref 37.5–51.0)
Hemoglobin: 12.1 g/dL — ABNORMAL LOW (ref 13.0–17.7)
MCH: 31.5 pg (ref 26.6–33.0)
MCHC: 33.5 g/dL (ref 31.5–35.7)
MCV: 94 fL (ref 79–97)
Platelets: 162 10*3/uL (ref 150–450)
RBC: 3.84 x10E6/uL — ABNORMAL LOW (ref 4.14–5.80)
RDW: 13.3 % (ref 11.6–15.4)
WBC: 5.3 10*3/uL (ref 3.4–10.8)

## 2019-03-22 LAB — LIPID PANEL
Chol/HDL Ratio: 2.9 ratio (ref 0.0–5.0)
Cholesterol, Total: 117 mg/dL (ref 100–199)
HDL: 41 mg/dL (ref >39)
LDL Calculated: 55 mg/dL (ref 0–99)
Triglycerides: 106 mg/dL (ref 0–149)
VLDL Cholesterol Cal: 21 mg/dL (ref 5–40)

## 2019-03-22 MED ORDER — NITROGLYCERIN 0.4 MG SL SUBL: 0.4000 mg | SUBLINGUAL_TABLET | SUBLINGUAL | 3 refills | Status: DC | PRN

## 2019-03-22 NOTE — Patient Instructions (Signed)
Medication Instructions:  Continue current medications  If you need a refill on your cardiac medications before your next appointment, please call your pharmacy.  Labwork: Fasting Lipid liver, BMP and CBC HERE IN OUR OFFICE AT [facility]  You will need to fast. DO NOT EAT OR DRINK PAST MIDNIGHT.     Take the provided lab slips with you to the lab for your blood draw.   When you have your labs (blood work) drawn today and your tests are completely normal, you will receive your results only by MyChart Message (if you have MyChart) -OR-  A paper copy in the mail.  If you have any lab test that is abnormal or we need to change your treatment, we will call you to review these results.  Testing/Procedures: None Ordered  Follow-Up: You will need a follow up appointment in 6 months.  Please call our office 2 months in advance to schedule this appointment.  You may see Dr Gwenlyn Found or one of the following Advanced Practice Providers on your designated Care Team:   Kerin Ransom, PA-C Roby Lofts, [place] . Sande Rives, PA-C     At [facility], you and your health needs are our priority.  As part of our continuing mission to provide you with exceptional heart care, we have created designated Provider Care Teams.  These Care Teams include your primary Cardiologist (physician) and Advanced Practice Providers (APPs -  Physician Assistants and Nurse Practitioners) who all work together to provide you with the care you need, when you need it.  Thank you for choosing [facility] at [facility]!!

## 2019-05-04 ENCOUNTER — Encounter (INDEPENDENT_AMBULATORY_CARE_PROVIDER_SITE_OTHER): Payer: Self-pay | Admitting: Internal Medicine

## 2019-05-04 ENCOUNTER — Ambulatory Visit (INDEPENDENT_AMBULATORY_CARE_PROVIDER_SITE_OTHER): Payer: PPO | Admitting: Internal Medicine

## 2019-05-04 ENCOUNTER — Other Ambulatory Visit: Payer: Self-pay

## 2019-05-04 VITALS — BP 140/70 | HR 60 | Ht 72.0 in | Wt 213.6 lb

## 2019-05-04 DIAGNOSIS — Z9861 Coronary angioplasty status: Secondary | ICD-10-CM | POA: Diagnosis not present

## 2019-05-04 DIAGNOSIS — I739 Peripheral vascular disease, unspecified: Secondary | ICD-10-CM

## 2019-05-04 DIAGNOSIS — Z0001 Encounter for general adult medical examination with abnormal findings: Secondary | ICD-10-CM

## 2019-05-04 DIAGNOSIS — Z125 Encounter for screening for malignant neoplasm of prostate: Secondary | ICD-10-CM

## 2019-05-04 DIAGNOSIS — E559 Vitamin D deficiency, unspecified: Secondary | ICD-10-CM

## 2019-05-04 DIAGNOSIS — I2510 Atherosclerotic heart disease of native coronary artery without angina pectoris: Secondary | ICD-10-CM

## 2019-05-04 DIAGNOSIS — K219 Gastro-esophageal reflux disease without esophagitis: Secondary | ICD-10-CM | POA: Diagnosis not present

## 2019-05-04 DIAGNOSIS — E119 Type 2 diabetes mellitus without complications: Secondary | ICD-10-CM

## 2019-05-04 DIAGNOSIS — E785 Hyperlipidemia, unspecified: Secondary | ICD-10-CM

## 2019-05-04 DIAGNOSIS — I10 Essential (primary) hypertension: Secondary | ICD-10-CM

## 2019-05-04 HISTORY — DX: Vitamin D deficiency, unspecified: E55.9

## 2019-05-04 NOTE — Progress Notes (Signed)
Chief Complaint: This 73 year old man comes in for his annual physical exam and to address his chronic conditions which are described below. HPI: He has a history of diabetes which I have been following for several years.  His last hemoglobin A1c was 6.5%.  He thankfully does not have any neuropathy, nephropathy, retinopathy.  In fact, I reduced his oral hypoglycemic agents to every other day and he is tolerated this.  He denies any polyuria or polydipsia.  He denies any numbness or tingling in his hands or feet. He also has coronary artery disease and follows with cardiology on a regular basis.  He was seen last in August of this year and things were stable from their perspective.  He also has peripheral vascular disease and since he had stenting on the left side, he has had no problems with symptoms of intermittent claudication. He also has vitamin D deficiency and takes vitamin D3 supplementation. He continues with antihypertensive medications.  He denies any exertional chest pain, dyspnea, PND, orthopnea.  There is no leg swelling. He also continues with statin therapy without any myalgias. He does have gastroesophageal reflux disease and he describes a feeling of heaviness in his chest postprandially.  Thankfully, this does not occur all the time.  He certainly does not complain of exertional chest discomfort as he is very active at home with exercise which is described below.  Past Medical History:  Diagnosis Date  . CHF (congestive heart failure) ([place])   . Coronary artery disease    s/p multiple caths 2012, stenting 1/19 NSTEMI with occlusion of OM, unable to wire-->Rx therapy, normal EF  . Diabetes mellitus   . GERD (gastroesophageal reflux disease)   . Hyperlipidemia   . Hypertension   . PVD (peripheral vascular disease) ([place])    left SFA PTA & stenting in 02/2005 (Dr. Adora Fridge)  . Vitamin D deficiency disease 05/04/2019   Past Surgical History:  Procedure Laterality Date  . CARDIAC  CATHETERIZATION  12/23/2004   normal L main, normal LAD, normal L Cfx, RCA with 20% hypodense lesion in first end of vessel (Dr. Adora Fridge)  . CARDIAC CATHETERIZATION  08/26/2007   no significant CAD by cath, EF 50% (Dr. Jackie Plum)  . CARDIAC CATHETERIZATION  08/28/2010   stent to OM1 with 2.0x49mm BMS (Dr. Adora Fridge)  . CARDIAC CATHETERIZATION  09/11/2010   patent stent (Dr. Roni Bread)  . CARDIAC CATHETERIZATION  02/10/2011   95% prox in-stent restenosis within OM stent - opened with cutting balloon (Dr. Corky Downs)  . CARDIAC CATHETERIZATION  07/17/2011   in-stent restenosis - re-stented with Promus 2.25x21mm DES (Dr. Roni Bread)  . COLONOSCOPY  12/2009   Dr. Hampton Abbot  . COLONOSCOPY N/A 01/30/2017   Procedure: COLONOSCOPY;  Surgeon: Daneil Dolin, MD;  [facility];  Service: Endoscopy;  Laterality: N/A;  8:15am  . CORONARY BALLOON ANGIOPLASTY N/A 08/30/2017   Procedure: CORONARY BALLOON ANGIOPLASTY;  Surgeon: [name], MD;  Location: [place] [facility];  Service: Cardiovascular;  Laterality: N/A;  . 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 FEMORAL ARTERY STENT  02/27/2005   L SFA stenting - Wholey down SFA across lesion - predilatation with 4x4 Powerflex, stenting with 7x4 Smart, post-dilatation with 6x4 powerflex (Dr. Adora Fridge)  . LEFT HEART CATH AND CORONARY ANGIOGRAPHY N/A 08/30/2017   Procedure: LEFT HEART CATH AND CORONARY ANGIOGRAPHY;  Surgeon: Quay Burow  J, MD;  Location: [place] [facility];  Service: Cardiovascular;  Laterality: N/A;  . LEFT HEART CATHETERIZATION WITH CORONARY ANGIOGRAM N/A 07/17/2011   Procedure: LEFT HEART CATHETERIZATION WITH CORONARY ANGIOGRAM;  Surgeon: Leonie Man, MD;  Location: [facility] CATH LAB;  Service: Cardiovascular;  Laterality: N/A;  Right radial approach  . left knee arthroscopy  05/2016  . NM MYOCAR PERF WALL  MOTION  09/08/2013   abnormal lexiscan - low to intermediate risk;   . 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        Allergies:  Allergies  Allergen Reactions  . Propoxyphene N-Acetaminophen Nausea Only  . Statins Other (See Comments)    Severe muscle cramping/aching/pain  . Tape     Blisters  . Zetia [Ezetimibe]     Muscle cramping  . Flomax [Tamsulosin Hcl] Rash  . Levaquin [Levofloxacin Hemihydrate] Rash  . Penicillins Rash    Broke out in rash 6 years ago, pt recently took penicillin (09/2016) and had no reaction Has patient had a PCN reaction causing immediate rash, facial/tongue/throat swelling, SOB or lightheadedness with hypotension: Yes Has patient had a PCN reaction causing severe rash involving mucus membranes or skin necrosis: Unknown Has patient had a PCN reaction that required hospitalization: No Has patient had a PCN reaction occurring within the last 10 years: Yes If all of the above answers are "NO", then m     Current Meds  Medication Sig  . acetaminophen (TYLENOL) 500 MG tablet Take 1,000 mg by mouth every 6 (six) hours as needed for mild pain.  Marland Kitchen aspirin 81 MG chewable tablet Chew 1 tablet (81 mg total) by mouth daily.  . Cholecalciferol (VITAMIN D-3) 125 MCG (5000 UT) TABS Take 1 tablet by mouth daily.  . clopidogrel (PLAVIX) 75 MG tablet TAKE ONE TABLET BY MOUTH ONCE DAILY.  Marland Kitchen Coenzyme Q10 (CO Q-10) 400 MG CAPS Take 400 mg by mouth daily.  . cyclobenzaprine (FLEXERIL) 10 MG tablet Take 10 mg by mouth 3 (three) times daily as needed for muscle spasms.  . DEXILANT 60 MG capsule TAKE 1 CAPSULE ONCE DAILY BEFORE BREAKFAST.  . fexofenadine (ALLEGRA) 180 MG tablet Take 180 mg by mouth daily.    Marland Kitchen  glipiZIDE-metformin (METAGLIP) 5-500 MG tablet Take 1 tablet by mouth every other day.   . isosorbide mononitrate (IMDUR) 30 MG 24 hr tablet Take 30 mg by mouth daily.  Marland Kitchen lisinopril (PRINIVIL,ZESTRIL) 5 MG tablet Take 5 mg by mouth daily.  . metoprolol succinate (TOPROL-XL) 25 MG 24 hr tablet TAKE 2 TABLETS BY MOUTH ONCE DAILY.  . nitroGLYCERIN (NITROSTAT) 0.4 MG SL tablet Place 1 tablet (0.4 mg total) under the tongue every 5 (five) minutes as needed. For chest pain  . pantoprazole (PROTONIX) 40 MG tablet Take 40 mg by mouth daily.  . ranolazine (RANEXA) 500 MG 12 hr tablet Take 500 mg by mouth daily.  . rosuvastatin (CRESTOR) 20 MG tablet TAKE 1 TABLET ONCE DAILY AT 6 P.M.     Nutrition He typically does not eat breakfast and tends to therefore fast for 14 to 16 hours every day.  He tries to eat healthy.  Sleep 6-7 hours uninterrupted sleep every night.  Exercise He walks for at least an hour 6 days  a week and does strength training 4 to 5 days a week.  Bio-identical hormones None.  [id number] from the symptoms mentioned above,there are no other symptoms referable to all systems reviewed.  Physical Exam: Blood pressure 140/70, pulse 60, height 6' (1.829 m), weight 213 lb 9.6 oz (96.9 kg). Vitals with BMI 05/04/2019 03/22/2019 06/07/2018  Height 6\' 0"  6\' 0"  6\' 0"   Weight 213 lbs 10 oz 210 lbs 209 lbs 10 oz  BMI 28.96 [id number] [id number]  Systolic [id number]  Diastolic 70 69 71  Pulse 60 53 56      He looks systemically well and very good for his age. General: Alert, cooperative, and appears to be the stated age.No pallor.  No jaundice.  No clubbing. Head: Normocephalic Eyes: Sclera white, pupils equal and reactive to light, red reflex x 2,  Ears: Normal bilaterally Oral cavity: Lips, mucosa, and tongue normal: Teeth and gums normal Neck: No adenopathy, supple, symmetrical, trachea midline, and thyroid does not appear enlarged Respiratory: Clear to auscultation bilaterally.No  wheezing, crackles or bronchial breathing. Cardiovascular: Heart sounds are present and appear to be normal without murmurs or added sounds.  No carotid bruits.  Peripheral pulses are present and equal bilaterally.: Soft, nontender, Gastrointestinal:positive bowel sounds, no hepatosplenomegaly.  No masses felt.No tenderness. Skin: Clear, No rashes noted.No worrisome skin lesions seen. Neurological: Grossly intact without focal findings, cranial nerves II through XII intact, muscle strength equal bilaterally Musculoskeletal: No acute joint abnormalities noted.Full range of movement noted with joints. Psychiatric: Affect appropriate, non-anxious.    Assessment  1. Essential hypertension   2. Gastroesophageal reflux disease without esophagitis   3. Non-insulin treated type 2 diabetes mellitus ([place])   4. Dyslipidemia   5. CAD S/P percutaneous coronary angioplasty   6. Peripheral vascular disease ([place])   7. Vitamin D deficiency disease   8. Special screening for malignant neoplasm of prostate   9. Encounter for general adult medical examination with abnormal findings     Tests Ordered:   Orders Placed This Encounter  Procedures  . CBC  . COMPLETE METABOLIC PANEL WITH GFR  . Hemoglobin A1c  . Lipid panel  . PSA  . T3, free  . T4  . TSH  . VITAMIN D 25 Hydroxy (Vit-D Deficiency, Fractures)     Plan  1. He will continue with all medications for his chronic conditions, which appear to be stable. 2. Blood work is ordered as above. 3. We may need to send him back to gastroenterology if he has more frequent/consistent chest discomfort postprandially. 4. I have asked him to come back in the first or second week of October for his influenza vaccination. 5. Further recommendations will depend on blood results and I will see him in 3 months time for follow-up. 6. Today in addition to a preventative visit, I performed an office visit to address his chronic conditions above.         Emmalee Solivan C Kambrea Carrasco   05/04/2019, 11:25 AM

## 2019-05-04 NOTE — Patient Instructions (Signed)

## 2019-05-05 ENCOUNTER — Other Ambulatory Visit (INDEPENDENT_AMBULATORY_CARE_PROVIDER_SITE_OTHER): Payer: Self-pay | Admitting: Internal Medicine

## 2019-05-05 DIAGNOSIS — D649 Anemia, unspecified: Secondary | ICD-10-CM

## 2019-05-05 LAB — COMPLETE METABOLIC PANEL WITH GFR
AG Ratio: 1.4 (calc) (ref 1.0–2.5)
ALT: 24 U/L (ref 9–46)
AST: 26 U/L (ref 10–35)
Albumin: 3.9 g/dL (ref 3.6–5.1)
Alkaline phosphatase (APISO): 46 U/L (ref 35–144)
BUN: 15 mg/dL (ref 7–25)
CO2: 29 mmol/L (ref 20–32)
Calcium: 9.6 mg/dL (ref 8.6–10.3)
Chloride: 105 mmol/L (ref 98–110)
Creat: 1.16 mg/dL (ref 0.70–1.18)
GFR, Est African American: 73 mL/min/{1.73_m2} (ref >=60)
GFR, Est Non African American: 63 mL/min/{1.73_m2} (ref >=60)
Globulin: 2.8 g/dL (calc) (ref 1.9–3.7)
Glucose, Bld: 153 mg/dL — ABNORMAL HIGH (ref 65–99)
Potassium: 4 mmol/L (ref 3.5–5.3)
Sodium: 140 mmol/L (ref 135–146)
Total Bilirubin: 0.9 mg/dL (ref 0.2–1.2)
Total Protein: 6.7 g/dL (ref 6.1–8.1)

## 2019-05-05 LAB — PSA: PSA: 0.8 ng/mL (ref <=4.0)

## 2019-05-05 LAB — HEMOGLOBIN A1C
Hgb A1c MFr Bld: 6.3 % of total Hgb — ABNORMAL HIGH (ref <5.7)
Mean Plasma Glucose: 134 (calc)
eAG (mmol/L): 7.4 (calc)

## 2019-05-05 LAB — CBC
HCT: 36.6 % — ABNORMAL LOW (ref 38.5–50.0)
Hemoglobin: 12 g/dL — ABNORMAL LOW (ref 13.2–17.1)
MCH: 30.9 pg (ref 27.0–33.0)
MCHC: 32.8 g/dL (ref 32.0–36.0)
MCV: 94.3 fL (ref 80.0–100.0)
MPV: 11.3 fL (ref 7.5–12.5)
Platelets: 156 10*3/uL (ref 140–400)
RBC: 3.88 10*6/uL — ABNORMAL LOW (ref 4.20–5.80)
RDW: 13.2 % (ref 11.0–15.0)
WBC: 4.4 10*3/uL (ref 3.8–10.8)

## 2019-05-05 LAB — LIPID PANEL
Cholesterol: 125 mg/dL (ref <200)
HDL: 38 mg/dL — ABNORMAL LOW (ref >=40)
LDL Cholesterol (Calc): 65 mg/dL (calc)
Non-HDL Cholesterol (Calc): 87 mg/dL (calc) (ref <130)
Total CHOL/HDL Ratio: 3.3 (calc) (ref <5.0)
Triglycerides: 141 mg/dL (ref <150)

## 2019-05-05 LAB — T4: T4, Total: 5.5 ug/dL (ref 4.9–10.5)

## 2019-05-05 LAB — T3, FREE: T3, Free: 3 pg/mL (ref 2.3–4.2)

## 2019-05-05 LAB — TSH: TSH: 0.65 mIU/L (ref 0.40–4.50)

## 2019-05-05 LAB — VITAMIN D 25 HYDROXY (VIT D DEFICIENCY, FRACTURES): Vit D, 25-Hydroxy: 45 ng/mL (ref 30–100)

## 2019-05-06 ENCOUNTER — Encounter: Payer: Self-pay | Admitting: Gastroenterology

## 2019-05-06 ENCOUNTER — Other Ambulatory Visit (INDEPENDENT_AMBULATORY_CARE_PROVIDER_SITE_OTHER): Payer: Self-pay | Admitting: Internal Medicine

## 2019-05-06 DIAGNOSIS — D649 Anemia, unspecified: Secondary | ICD-10-CM

## 2019-05-06 LAB — FOLATE: Folate: 12.5 ng/mL

## 2019-05-06 LAB — IRON, TOTAL/TOTAL IRON BINDING CAP
%SAT: 13 % (calc) — ABNORMAL LOW (ref 20–48)
Iron: 41 ug/dL — ABNORMAL LOW (ref 50–180)
TIBC: 306 mcg/dL (calc) (ref 250–425)

## 2019-05-06 LAB — VITAMIN B12: Vitamin B-12: 303 pg/mL (ref 200–1100)

## 2019-05-06 LAB — FERRITIN: Ferritin: 34 ng/mL (ref 24–380)

## 2019-05-06 NOTE — Progress Notes (Signed)
It looks like from the blood test that you may have iron deficiency.  We will need to refer you to the gastroenterologist again to check this out.  I will asked Nellie to refer you next week.

## 2019-05-09 NOTE — Progress Notes (Signed)
05/26/2019 is when he will see dr Gala Romney

## 2019-05-26 ENCOUNTER — Ambulatory Visit: Payer: PPO | Admitting: Gastroenterology

## 2019-05-26 ENCOUNTER — Ambulatory Visit (INDEPENDENT_AMBULATORY_CARE_PROVIDER_SITE_OTHER): Payer: PPO

## 2019-05-26 ENCOUNTER — Other Ambulatory Visit: Payer: Self-pay

## 2019-05-26 ENCOUNTER — Encounter: Payer: Self-pay | Admitting: Gastroenterology

## 2019-05-26 DIAGNOSIS — Z23 Encounter for immunization: Secondary | ICD-10-CM | POA: Diagnosis not present

## 2019-05-26 DIAGNOSIS — D508 Other iron deficiency anemias: Secondary | ICD-10-CM

## 2019-05-26 DIAGNOSIS — D509 Iron deficiency anemia, unspecified: Secondary | ICD-10-CM | POA: Insufficient documentation

## 2019-05-26 MED ORDER — DEXILANT 60 MG PO CPDR: DELAYED_RELEASE_CAPSULE | ORAL | 3 refills | Status: DC

## 2019-05-26 NOTE — Progress Notes (Signed)
Primary Care Physician:  [name], MD  Referring Physician: Dr. Anastasio Champion Primary Gastroenterologist:  Dr. Gala Romney   Chief Complaint  Patient presents with  . Anemia    lab work don 04/2019    HPI:   [name] is a 73 y.o. male presenting today at the request of Dr. Anastasio Champion due to new onset IDA. Hgb recently 12 (Down from 13/14 range), ferritin low normal at 34, iron low at 41, iron sats 13. Folate normal, B12 303. Last colonoscopy completed in June 2018 due to heme positive stool, with pancolonic diverticulosis, non-bleeding internal hemorrhoids. 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 No personal history of polyps, and no family history of colorectal cancer or polyps.   No rectal bleeding, melena, hematochezia. No abdominal pain. No N/V. No dysphagia. +GERD, chronic. Over the years has gotten worse. Sometimes scares him because the acid backs up and hurts in chest. Dexilant once daily. Doesn't eat as much over the past year, feels busy. Notes early satiety with eating. Eating one meal a day, usually at dinner. Gets busy and doesn't think of eating. Has taken motrin and tylenol sparingly in the past up until 2-3 months ago, when he started having knee pain and took Motrin every day.   Past Medical History:  Diagnosis Date  . CHF (congestive heart failure) ([place])   . Coronary artery disease    s/p multiple caths 2012, stenting 1/19 NSTEMI with occlusion of OM, unable to wire-->Rx therapy, normal EF  . Diabetes mellitus   . GERD (gastroesophageal reflux disease)   . Hyperlipidemia   . Hypertension   . MI (myocardial infarction) ([place]) 2019  . PVD (peripheral vascular disease) ([place])    left SFA PTA & stenting in 02/2005 (Dr. Adora Fridge)  . Vitamin D deficiency disease 05/04/2019    Past Surgical History:  Procedure Laterality Date  . CARDIAC CATHETERIZATION  12/23/2004   normal L main, normal LAD,  normal L Cfx, RCA with 20% hypodense lesion in first end of vessel (Dr. Adora Fridge)  . CARDIAC CATHETERIZATION  08/26/2007   no significant CAD by cath, EF 50% (Dr. Jackie Plum)  . CARDIAC CATHETERIZATION  08/28/2010   stent to OM1 with 2.0x103mm BMS (Dr. Adora Fridge)  . CARDIAC CATHETERIZATION  09/11/2010   patent stent (Dr. Roni Bread)  . CARDIAC CATHETERIZATION  02/10/2011   95% prox in-stent restenosis within OM stent - opened with cutting balloon (Dr. Corky Downs)  . CARDIAC CATHETERIZATION  07/17/2011   in-stent restenosis - re-stented with Promus 2.25x10mm DES (Dr. Roni Bread)  . COLONOSCOPY  12/2009   Dr. Hampton Abbot  . COLONOSCOPY N/A 01/30/2017   Procedure: COLONOSCOPY;  Surgeon: Daneil Dolin, MD;  Location: [facility];  Service: Endoscopy;  Laterality: N/A;  8:15am  . CORONARY BALLOON ANGIOPLASTY N/A 08/30/2017   Procedure: CORONARY BALLOON ANGIOPLASTY;  Surgeon: [name], MD;  Location: [place] [facility];  Service: Cardiovascular;  Laterality: N/A;  . 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 FEMORAL ARTERY STENT  02/27/2005   L SFA stenting - [place] down SFA across lesion - predilatation with 4x4 Powerflex, stenting with 7x4 Smart, post-dilatation with 6x4 powerflex (Dr. Adora Fridge)  . LEFT HEART CATH AND CORONARY ANGIOGRAPHY N/A 08/30/2017   Procedure: LEFT HEART CATH AND CORONARY ANGIOGRAPHY;  Surgeon: [name], MD;  Location: [place] [facility];  Service: Cardiovascular;  Laterality: N/A;  . LEFT HEART CATHETERIZATION WITH CORONARY ANGIOGRAM N/A 07/17/2011   Procedure: LEFT HEART CATHETERIZATION WITH CORONARY ANGIOGRAM;  Surgeon: Leonie Man, MD;  Location: [facility] C[facility];  Service: Cardiovascular;  Laterality: N/A;  Right radial approach  . left knee arthroscopy  05/2016  . NM MYOCAR PERF WALL MOTION  09/08/2013   abnormal lexiscan - low to intermediate  risk;   . TRANSTHORACIC ECHOCARDIOGRAM  09/08/2013   EF 50-55%, mild LVH, grade 1 diastolic dysfunction, mildly calcified AV annulus, calcified MV, LA mildly dilated,     Current Outpatient Medications  Medication Sig Dispense Refill  . acetaminophen (TYLENOL) 500 MG tablet Take 1,000 mg by mouth every 6 (six) hours as needed for mild pain.    Marland Kitchen aspirin 81 MG chewable tablet Chew 1 tablet (81 mg total) by mouth daily.    . Cholecalciferol (VITAMIN D-3) 125 MCG (5000 UT) TABS Take 1 tablet by mouth daily.    . clopidogrel (PLAVIX) 75 MG tablet TAKE ONE TABLET BY MOUTH ONCE DAILY. 30 tablet 9  . Coenzyme Q10 (CO Q-10) 400 MG CAPS Take 400 mg by mouth daily.    . cyclobenzaprine (FLEXERIL) 10 MG tablet Take 10 mg by mouth 3 (three) times daily as needed for muscle spasms.    . DEXILANT 60 MG capsule TAKE 1 CAPSULE ONCE DAILY BEFORE BREAKFAST. 30 capsule 5  . dextromethorphan-guaiFENesin (MUCINEX DM) 30-600 MG 12hr tablet Take 1 tablet by mouth 2 (two) times daily.    . fexofenadine (ALLEGRA) 180 MG tablet Take 180 mg by mouth daily.      Marland Kitchen glipiZIDE-metformin (METAGLIP) 5-500 MG tablet Take 1 tablet by mouth every other day.     . isosorbide mononitrate (IMDUR) 30 MG 24 hr tablet Take 30 mg by mouth daily.    Marland Kitchen lisinopril (PRINIVIL,ZESTRIL) 5 MG tablet Take 5 mg by mouth daily.    Marland Kitchen loratadine (CLARITIN) 10 MG tablet Take 10 mg by mouth daily.    . metoprolol succinate (TOPROL-XL) 25 MG 24 hr tablet TAKE 2 TABLETS BY MOUTH ONCE DAILY. 180 tablet 0  . nitroGLYCERIN (NITROSTAT) 0.4 MG SL tablet Place 1 tablet (0.4 mg total) under the tongue every 5 (five) minutes as needed. For chest pain 25 tablet 3  . ranolazine (RANEXA) 500 MG 12 hr tablet Take 500 mg by mouth daily.    . rosuvastatin (CRESTOR) 20 MG tablet TAKE 1 TABLET ONCE DAILY AT 6 P.M. 90 tablet 0   No current facility-administered medications for this visit.     Allergies as of 05/26/2019 - Review Complete 05/26/2019  Allergen  Reaction Noted  . Propoxyphene n-acetaminophen Nausea Only   . Statins Other (See Comments) 07/29/2011  . Tape  07/29/2011  . Zetia [ezetimibe]  08/30/2017  . Flomax [tamsulosin hcl] Rash 08/16/2014  . Levaquin [levofloxacin hemihydrate] Rash 07/29/2011  . Penicillins Rash     Family History  Problem Relation Age of Onset  . Arrhythmia Mother 61  . Colon cancer Neg Hx   . Liver disease Neg Hx   . Inflammatory bowel disease Neg Hx   . Colon polyps Neg Hx     Social History   Socioeconomic History  . Marital status: Married    Spouse name: Not on file  . Number of children: 3  . Years of education: Not on file  . Highest education level: Not on file  Occupational History  . Occupation: Retired  Employer: [employer]  Social Needs  . Financial resource strain: Not on file  . Food insecurity    Worry: Not on file    Inability: Not on file  . Transportation needs    Medical: Not on file    Non-medical: Not on file  Tobacco Use  . Smoking status: Former Smoker    Years: 50.00    Quit date: 05/12/1999    Years since quitting: 20.0  . Smokeless tobacco: Never Used  Substance and Sexual Activity  . Alcohol use: No  . Drug use: No  . Sexual activity: Never  Lifestyle  . Physical activity    Days per week: Not on file    Minutes per session: Not on file  . Stress: Not on file  Relationships  . Social Herbalist on phone: Not on file    Gets together: Not on file    Attends religious service: Not on file    Active member of club or organization: Not on file    Attends meetings of clubs or organizations: Not on file    Relationship status: Not on file  . Intimate partner violence    Fear of current or ex partner: Not on file    Emotionally abused: Not on file    Physically abused: Not on file    Forced sexual activity: Not on file  Other Topics Concern  . 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    Review of Systems: Gen: see HPI CV: Denies chest pain, heart palpitations, peripheral edema, syncope.  Resp: Denies shortness of breath at rest or with exertion. Denies wheezing or cough.  GI: see HPI GU : Denies urinary burning, urinary frequency, urinary hesitancy MS: see HPI Derm: Denies rash, itching, dry skin Psych: Denies depression, anxiety, memory loss, and confusion Heme: Denies bruising, bleeding, and enlarged lymph nodes.  Physical Exam: BP 126/74   Pulse (!) 55   Temp (!) 97.1 F (36.2 C) (Oral)   Ht 6' (1.829 m)   Wt 213 lb 12.8 oz (97 kg)   BMI 29.00 kg/m  General:   Alert and oriented. Pleasant and cooperative. Well-nourished and well-developed.  Head:  Normocephalic and atraumatic. Eyes:  Without icterus, sclera clear and conjunctiva pink.  Ears:  Normal auditory acuity. Nose:  No deformity, discharge,  or lesions. Lungs:  Clear to auscultation bilaterally. No wheezes, rales, or rhonchi. No distress.  Heart:  S1, S2 present without murmurs appreciated.  Abdomen:  +BS, soft, non-tender and non-distended. No HSM noted. No guarding or rebound. No masses appreciated.  Rectal:  Deferred  Msk:  Symmetrical without gross deformities. Normal posture. Extremities:  Without  edema. Neurologic:  Alert and  oriented x4 Psych:  Alert and cooperative. Normal mood and affect.  Lab Results  Component Value Date   WBC 4.4 05/04/2019   HGB 12.0 (L) 05/04/2019   HCT 36.6 (L) 05/04/2019   MCV 94.3 05/04/2019   PLT 156 05/04/2019   Lab Results  Component Value Date   ALT 24 05/04/2019   AST 26 05/04/2019   ALKPHOS 52 03/22/2019   BILITOT 0.9 05/04/2019   Lab Results  Component Value Date   CREATININE 1.16 05/04/2019   BUN 15 05/04/2019   NA 140 05/04/2019   K 4.0 05/04/2019   CL 105 05/04/2019   CO2 29 05/04/2019   Lab Results  Component Value Date  IRON 41 (L) 05/05/2019   TIBC 306  05/05/2019   FERRITIN 34 05/05/2019

## 2019-05-26 NOTE — Assessment & Plan Note (Signed)
Very pleasant 73 year old male presenting with new onset normocytic anemia and evidence for IDA on labs with ferritin low normal and iron low. No overt GI bleeding. Last colonoscopy in June 2018 without polyps, and he has no personal history of polyps and no family history of colorectal cancer or polyps. In talking with patient, he has increased Motrin use over the past 2-3 months due to joint pain, and he endorses early satiety and decreased appetite. As last EGD in 2011, he needs as minimum of upper endoscopy in setting of new onset IDA; we also talked about early interval colonoscopy, which he is hesitant to pursue at this time but willing to pursue if EGD is negative. With presentation, query NSAID-induced etiology precipitating IDA but unable to exclude occult malignancy. He may ultimately need capsule study as well.   Proceed with upper endoscopy in the near future with Dr. Gala Romney. The risks, benefits, and alternatives have been discussed in detail with patient. They have stated understanding and desire to proceed.  Hold metaglip day of procedure Continue Dexilant: refills provided If EGD is negative, need to consider capsule study +/- early interval colonoscopy.  Return in follow-up thereafter

## 2019-05-26 NOTE — Patient Instructions (Addendum)
We are scheduling you for an upper endoscopy. If this is negative, you may need a capsule study of your small intestine or possibly reevaluation of lower GI tract. Do not take oral diabetes medication the day of the procedure.  Avoid Motrin going forward. You can continue with tylenol products as needed.  We will see you in follow-up thereafter!  I enjoyed seeing you again today! As you know, I value our relationship and want to provide genuine, compassionate, and quality care. I welcome your feedback. If you receive a survey regarding your visit,  I greatly appreciate you taking time to fill this out. See you next time!  Annitta Needs, PhD, ANP-BC [facility]   Food Choices for Gastroesophageal Reflux Disease, Adult When you have gastroesophageal reflux disease (GERD), the foods you eat and your eating habits are very important. Choosing the right foods can help ease the discomfort of GERD. Consider working with a diet and nutrition specialist (dietitian) to help you make healthy food choices. What general guidelines should I follow?  Eating plan  Choose healthy foods low in fat, such as fruits, vegetables, whole grains, low-fat dairy products, and lean meat, fish, and poultry.  Eat frequent, small meals instead of three large meals each day. Eat your meals slowly, in a relaxed setting. Avoid bending over or lying down until 2-3 hours after eating.  Limit high-fat foods such as fatty meats or fried foods.  Limit your intake of oils, butter, and shortening to less than 8 teaspoons each day.  Avoid the following: ? Foods that cause symptoms. These may be different for different people. Keep a food diary to keep track of foods that cause symptoms. ? Alcohol. ? Drinking large amounts of liquid with meals. ? Eating meals during the 2-3 hours before bed.  Cook foods using methods other than frying. This may include baking, grilling, or broiling. Lifestyle  Maintain a  healthy weight. Ask your health care provider what weight is healthy for you. If you need to lose weight, work with your health care provider to do so safely.  Exercise for at least 30 minutes on 5 or more days each week, or as told by your health care provider.  Avoid wearing clothes that fit tightly around your waist and chest.  Do not use any products that contain nicotine or tobacco, such as cigarettes and e-cigarettes. If you need help quitting, ask your health care provider.  Sleep with the head of your bed raised. Use a wedge under the mattress or blocks under the bed frame to raise the head of the bed. What foods are not recommended? The items listed may not be a complete list. Talk with your dietitian about what dietary choices are best for you. Grains Pastries or quick breads with added fat. [place] toast. Vegetables Deep fried vegetables. [place] fries. Any vegetables prepared with added fat. Any vegetables that cause symptoms. For some people this may include tomatoes and tomato products, chili peppers, onions and garlic, and horseradish. Fruits Any fruits prepared with added fat. Any fruits that cause symptoms. For some people this may include citrus fruits, such as oranges, grapefruit, pineapple, and lemons. Meats and other protein foods High-fat meats, such as fatty beef or pork, hot dogs, ribs, ham, sausage, salami and bacon. Fried meat or protein, including fried fish and fried chicken. Nuts and nut butters. Dairy Whole milk and chocolate milk. Sour cream. Cream. Ice cream. Cream cheese. Milk shakes. Beverages Coffee and tea, with or without  caffeine. Carbonated beverages. Sodas. Energy drinks. Fruit juice made with acidic fruits (such as orange or grapefruit). Tomato juice. Alcoholic drinks. Fats and oils Butter. Margarine. Shortening. Ghee. Sweets and desserts Chocolate and cocoa. Donuts. Seasoning and other foods Pepper. Peppermint and spearmint. Any condiments, herbs,  or seasonings that cause symptoms. For some people, this may include curry, hot sauce, or vinegar-based salad dressings. Summary  When you have gastroesophageal reflux disease (GERD), food and lifestyle choices are very important to help ease the discomfort of GERD.  Eat frequent, small meals instead of three large meals each day. Eat your meals slowly, in a relaxed setting. Avoid bending over or lying down until 2-3 hours after eating.  Limit high-fat foods such as fatty meat or fried foods. This information is not intended to replace advice given to you by your health care provider. Make sure you discuss any questions you have with your health care provider. Document Released: 07/28/2005 Document Revised: 11/18/2018 Document Reviewed: 07/29/2016 Elsevier Patient Education  2020 [employer].

## 2019-06-14 ENCOUNTER — Telehealth: Payer: Self-pay | Admitting: Cardiovascular Disease

## 2019-06-14 NOTE — Telephone Encounter (Signed)
New Message  Pt c/o of Chest Pain: STAT if CP now or developed within 24 hours  1. Are you having CP right now? No  2. Are you experiencing any other symptoms (ex. SOB, nausea, vomiting, sweating)? SOB  3. How long have you been experiencing CP? 2 weeks  4. Is your CP continuous or coming and going? Coming and Going  5. Have you taken Nitroglycerin? Yes ? Scheduled appt with Dr. Gwenlyn Found on 06/21/19 at 8:00 am.

## 2019-06-14 NOTE — Telephone Encounter (Signed)
Called patient to discuss chest pain complaint. LVM advising to call back, and gave call back number.

## 2019-06-14 NOTE — Telephone Encounter (Signed)
Patient called back in- he states that for the past 2 weeks he has been having coming and going chest pain- he has taken nitroglycerin but last dose was two days ago- and pain went away once taking the one dose. He has not had one since. Patient states he has had some SOB with activities but no swelling in his hands/feet, no arm pain, does mention at one time having shoulder blade pain- I did advise patient if the chest pain came back with arm pain, if it did not resolve with nitro to call 911 and go to the hospital. Patient states he has had no changes in diet or medication. And his BP has been okay- has been more elevated for him the past 2-3 days, but most recent bp was 130/60. I advised patient I would route message to MD for any other recommendations- he was made an appointment on 06/21/2019 at 8:00 AM but advised I would send a message to make sure nothing else was needed at this time. Patient verbalized understanding.

## 2019-06-16 NOTE — Telephone Encounter (Signed)
An appointment for [name] on 11/10 is fine

## 2019-06-17 NOTE — Telephone Encounter (Signed)
Noted. Thanks.

## 2019-06-21 ENCOUNTER — Ambulatory Visit (INDEPENDENT_AMBULATORY_CARE_PROVIDER_SITE_OTHER): Payer: PPO | Admitting: Cardiovascular Disease

## 2019-06-21 ENCOUNTER — Other Ambulatory Visit: Payer: Self-pay

## 2019-06-21 ENCOUNTER — Encounter: Payer: Self-pay | Admitting: Cardiovascular Disease

## 2019-06-21 VITALS — BP 124/62 | HR 56 | Temp 97.3°F | Ht 72.0 in | Wt 210.0 lb

## 2019-06-21 DIAGNOSIS — R002 Palpitations: Secondary | ICD-10-CM | POA: Diagnosis not present

## 2019-06-21 DIAGNOSIS — I10 Essential (primary) hypertension: Secondary | ICD-10-CM | POA: Diagnosis not present

## 2019-06-21 DIAGNOSIS — I2510 Atherosclerotic heart disease of native coronary artery without angina pectoris: Secondary | ICD-10-CM | POA: Diagnosis not present

## 2019-06-21 DIAGNOSIS — Z9861 Coronary angioplasty status: Secondary | ICD-10-CM | POA: Diagnosis not present

## 2019-06-21 DIAGNOSIS — I739 Peripheral vascular disease, unspecified: Secondary | ICD-10-CM | POA: Diagnosis not present

## 2019-06-21 DIAGNOSIS — E785 Hyperlipidemia, unspecified: Secondary | ICD-10-CM

## 2019-06-21 MED ORDER — ISOSORBIDE MONONITRATE ER 60 MG PO TB24: 60.0000 mg | ORAL_TABLET | Freq: Every day | ORAL | 3 refills | Status: AC

## 2019-06-21 NOTE — Assessment & Plan Note (Signed)
History of peripheral arterial disease status post left SFA PTA and stenting by myself July 2006 with improvement in his symptoms at that time.  His most recent lower extremity arterial Doppler studies performed 08/26/2018 revealed a right ABI of 1.14 and a left ABI of 0.94 with a patent left SFA stent.  He denies claudication.  He does walk every day and plays golf 3 days a week.

## 2019-06-21 NOTE — Assessment & Plan Note (Signed)
History of essential hypertension with blood pressure measured today 124/62.  He is on lisinopril and metoprolol.

## 2019-06-21 NOTE — Assessment & Plan Note (Signed)
History of CAD status post catheterization by myself 08/28/2010 with stenting of his first obtuse marginal branch with a bare-metal stent.  He has had multiple catheterizations since that time most recently 08/30/2017 after being transferred from [facility] with a non-STEMI.  I catheterized him via the right radial approach revealing an occluded first obtuse marginal branch stent which I was unable to cross and decided to treat him medically.  He was doing well, exercising daily and playing golf 3 days a week until several weeks ago when he started to notice some mild twinges which are somewhat atypical and shortness of breath.  Given his relatively normal coronary arteries 2 years ago except for an occluded stent I am going to treat him medically at this time.  I am going to increase his Imdur from 30 to 60 mg a day and will have him see Kerin Ransom, PA-C back in the office in 4 to 6 weeks for follow-up.

## 2019-06-21 NOTE — Progress Notes (Signed)
06/21/2019 [name]   1946/06/21  [id number]  Primary Physician [name], MD Primary Cardiologist: [name] MD FACP, [place], [place], [place]  HPI:  [name] is a 73 y.o.  mildly overweight married African American male father of 3 who I last saw in the /30/19.  He did see Kerin Ransom in the office 06/07/2018 and Jory Sims 03/22/2019.Marland Kitchen He has a history of PVOD status post left SFA, PTA and stenting by myself back in July of 2006 with subsequent improvement in his claudication and Dopplers. These were last done in April of last year and showed ABIs of greater than 1 bilaterally. His other problems include hypertension, hyperlipidemia, non-insulin-requiring diabetes and statin intolerance. I catheterized him August 28, 2010 and stented his first OM branch with a bare metal stent. He was readmitted September 09, 2010 with recurrent chest pain and was re-cathed by Dr. Ellyn Hack via the right radial approach revealing a widely patent stent. He was admitted again June 29th through July 3rd with chest pain and ruled out for myocardial infarction. His stress test showed subtle lateral ischemia and cath performed by Dr. Claiborne Billings February 10, 2012 revealed 95% proximal in-stent restenosis within the OM stent which Dr. Claiborne Billings opened up with a cutting balloon. He had done well until December of last year when he was admitted again with chest pain. Dr. Ellyn Hack recathed him revealing again in-stent restenosis which was "restented" with a Promus drug-eluting stent. He was having chest pain when I saw him back in December, however, since that time he has had minimal chest pain and he does walk 3-5 miles a day. His most recent liver profile performed 08/22/15 revealed an LDL of 94 and HDL 47 on Zocor 20 mg a day. He was recently admitted with chest pain/rule out MI 08/30/13. This is either negative. 2-D echo was essentially unremarkable with mild inferoseptal hypokinesia and a Myoview stress test was read  as low risk. His medications were adjusted. Ranexa was added..He has had no recurrent chest pain since I saw him a year ago until recently when he was taking his Ranexa once a day instead of twice a day.Since I saw him a year ago he deniesclaudication but does complain of new onset chest pain several weeks ago which is exertional Recatheterized him revealing a totally occluded first obtuse marginal branch stent which I was unable to open.  The remainder of his coronary anatomy was free of significant disease and his LV function was normal.  The decision was made to treat him medically at that time with Imdur and regular ranolazine.  Since he was seen 3 months ago by Jory Sims, NP he has developed some atypical chest pain which is sharp, lasting seconds at a time occurring both at rest and with walking.  He was walking daily and playing golf 3 days a week.  He denies claudication.    Current Meds  Medication Sig   acetaminophen (TYLENOL) 500 MG tablet Take 1,000 mg by mouth every 6 (six) hours as needed for mild pain.   aspirin 81 MG chewable tablet Chew 1 tablet (81 mg total) by mouth daily.   Cholecalciferol (VITAMIN D-3) 125 MCG (5000 UT) TABS Take 1 tablet by mouth daily.   clopidogrel (PLAVIX) 75 MG tablet TAKE ONE TABLET BY MOUTH ONCE DAILY.   Coenzyme Q10 (CO Q-10) 400 MG CAPS Take 400 mg by mouth daily.   cyclobenzaprine (FLEXERIL) 10 MG tablet Take 10 mg by  mouth 3 (three) times daily as needed for muscle spasms.   dexlansoprazole (DEXILANT) 60 MG capsule TAKE 1 CAPSULE ONCE DAILY BEFORE BREAKFAST.   dextromethorphan-guaiFENesin (MUCINEX DM) 30-600 MG 12hr tablet Take 1 tablet by mouth 2 (two) times daily.   fexofenadine (ALLEGRA) 180 MG tablet Take 180 mg by mouth daily.     glipiZIDE-metformin (METAGLIP) 5-500 MG tablet Take 1 tablet by mouth every other day.    isosorbide mononitrate (IMDUR) 60 MG 24 hr tablet Take 1 tablet (60 mg total) by mouth daily.    lisinopril (PRINIVIL,ZESTRIL) 5 MG tablet Take 5 mg by mouth daily.   loratadine (CLARITIN) 10 MG tablet Take 10 mg by mouth daily.   metoprolol succinate (TOPROL-XL) 25 MG 24 hr tablet TAKE 2 TABLETS BY MOUTH ONCE DAILY.   nitroGLYCERIN (NITROSTAT) 0.4 MG SL tablet Place 1 tablet (0.4 mg total) under the tongue every 5 (five) minutes as needed. For chest pain   ranolazine (RANEXA) 500 MG 12 hr tablet Take 500 mg by mouth daily.   rosuvastatin (CRESTOR) 20 MG tablet TAKE 1 TABLET ONCE DAILY AT 6 P.M.   [DISCONTINUED] isosorbide mononitrate (IMDUR) 30 MG 24 hr tablet Take 30 mg by mouth daily.     Allergies  Allergen Reactions   Propoxyphene N-Acetaminophen Nausea Only   Statins Other (See Comments)    Severe muscle cramping/aching/pain   Tape     Blisters   Zetia [Ezetimibe]     Muscle cramping   Flomax [Tamsulosin Hcl] Rash   Levaquin [Levofloxacin Hemihydrate] Rash   Penicillins Rash    Broke out in rash 6 years ago, pt recently took penicillin (09/2016) and had no reaction Has patient had a PCN reaction causing immediate rash, facial/tongue/throat swelling, SOB or lightheadedness with hypotension: Yes Has patient had a PCN reaction causing severe rash involving mucus membranes or skin necrosis: Unknown Has patient had a PCN reaction that required hospitalization: No Has patient had a PCN reaction occurring within the last 10 years: Yes If all of the above answers are "NO", then m    Social History   Socioeconomic History   Marital status: Married    Spouse name: Not on file   Number of children: 3   Years of education: Not on file   Highest education level: Not on file  Occupational History   Occupation: Retired    Fish farm manager: [employer] resource strain: Not on file   Food insecurity    Worry: Not on file    Inability: Not on file   Transportation needs    Medical: Not on file    Non-medical: Not on file  Tobacco  Use   Smoking status: Former Smoker    Years: 50.00    Quit date: 05/12/1999    Years since quitting: 20.1   Smokeless tobacco: Never Used  Substance and Sexual Activity   Alcohol use: No   Drug use: No   Sexual activity: Never  Lifestyle   Physical activity    Days per week: Not on file    Minutes per session: Not on file   Stress: Not on file  Relationships   Social connections    Talks on phone: Not on file    Gets together: Not on file    Attends religious service: Not on file    Active member of club or organization: Not on file    Attends meetings of clubs or organizations: Not on file  Relationship status: Not on file   Intimate partner violence    Fear of current or ex partner: Not on file    Emotionally abused: Not on file    Physically abused: Not on file    Forced sexual activity: Not on file  Other Topics Concern   Not on file  Social History Narrative   Married for 48 years.Lives with wife.Retired,ex-lab Merchant navy officer.Ex-Marine,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     Review of Systems: General: negative for chills, fever, night sweats or weight changes.  Cardiovascular: negative for chest pain, dyspnea on exertion, edema, orthopnea, palpitations, paroxysmal nocturnal dyspnea or shortness of breath Dermatological: negative for rash Respiratory: negative for cough or wheezing Urologic: negative for hematuria Abdominal: negative for nausea, vomiting, diarrhea, bright red blood per rectum, melena, or hematemesis Neurologic: negative for visual changes, syncope, or dizziness All other systems reviewed and are otherwise negative except as noted above.    Blood pressure 124/62, pulse (!) 56, temperature (!) 97.3 F (36.3 C), height 6' (1.829 m), weight 210 lb (95.3 kg).  General appearance: alert and no distress Neck: no adenopathy, no carotid bruit, no JVD, supple, symmetrical, trachea midline and thyroid not enlarged, symmetric, no tenderness/mass/nodules Lungs:  clear to auscultation bilaterally Heart: regular rate and rhythm, S1, S2 normal, no murmur, click, rub or gallop Extremities: extremities normal, atraumatic, no cyanosis or edema Pulses: 2+ and symmetric Skin: Skin color, texture, turgor normal. No rashes or lesions Neurologic: Alert and oriented X 3, normal strength and tone. Normal symmetric reflexes. Normal coordination and gait  EKG sinus bradycardia at 56 without ST or T wave changes.  Personally reviewed this EKG.  ASSESSMENT AND PLAN:   Peripheral vascular disease ([place]) History of peripheral arterial disease status post left SFA PTA and stenting by myself July 2006 with improvement in his symptoms at that time.  His most recent lower extremity arterial Doppler studies performed 08/26/2018 revealed a right ABI of 1.14 and a left ABI of 0.94 with a patent left SFA stent.  He denies claudication.  He does walk every day and plays golf 3 days a week.  Dyslipidemia History of dyslipidemia on statin therapy with lipid profile performed 05/04/2019 revealing a total cholesterol of 125, LDL 55 and HDL of 38.  CAD S/P percutaneous coronary angioplasty History of CAD status post catheterization by myself 08/28/2010 with stenting of his first obtuse marginal branch with a bare-metal stent.  He has had multiple catheterizations since that time most recently 08/30/2017 after being transferred from [facility] with a non-STEMI.  I catheterized him via the right radial approach revealing an occluded first obtuse marginal branch stent which I was unable to cross and decided to treat him medically.  He was doing well, exercising daily and playing golf 3 days a week until several weeks ago when he started to notice some mild twinges which are somewhat atypical and shortness of breath.  Given his relatively normal coronary arteries 2 years ago except for an occluded stent I am going to treat him medically at this time.  I am going to increase his Imdur from  30 to 60 mg a day and will have him see Kerin Ransom, PA-C back in the office in 4 to 6 weeks for follow-up.  Essential hypertension History of essential hypertension with blood pressure measured today 124/62.  He is on lisinopril and metoprolol.      [name] MD FACP,FACC,FAHA, [facility] 06/21/2019 8:16 AM

## 2019-06-21 NOTE — Assessment & Plan Note (Signed)
History of dyslipidemia on statin therapy with lipid profile performed 05/04/2019 revealing a total cholesterol of 125, LDL 55 and HDL of 38.

## 2019-06-21 NOTE — Patient Instructions (Signed)
Medication Instructions:  Increase Imdur to 60mg  Daily.  If you need a refill on your cardiac medications before your next appointment, please call your pharmacy.   Lab work: NONE  Testing/Procedures: Your physician has recommended that you wear a 2 week event monitor. Event monitors are medical devices that record the heart's electrical activity. Doctors most often [place] these monitors to diagnose arrhythmias. Arrhythmias are problems with the speed or rhythm of the heartbeat. The monitor is a small, portable device. You can wear one while you do your normal daily activities. This is usually used to diagnose what is causing palpitations/syncope (passing out).   Follow-Up: At [facility], you and your health needs are our priority.  As part of our continuing mission to provide you with exceptional heart care, we have created designated Provider Care Teams.  These Care Teams include your primary Cardiologist (physician) and Advanced Practice Providers (APPs -  Physician Assistants and Nurse Practitioners) who all work together to provide you with the care you need, when you need it. You may see Dr Gwenlyn Found or one of the following Advanced Practice Providers on your designated Care Team:    Kerin Ransom, PA-C  [place], [place]  Coletta Memos, [place]  Your physician wants you to follow-up in: 1 year. You will receive a reminder letter in the mail two months in advance. If you don't receive a letter, please call our office to schedule the follow-up appointment.  Any Other Special Instructions Will Be Listed Below (If Applicable). Make an appointment with Kerin Ransom, PA in 4-6 weeks.

## 2019-06-23 ENCOUNTER — Other Ambulatory Visit (INDEPENDENT_AMBULATORY_CARE_PROVIDER_SITE_OTHER): Payer: Self-pay | Admitting: Nurse Practitioner

## 2019-06-23 ENCOUNTER — Other Ambulatory Visit: Payer: Self-pay | Admitting: Cardiovascular Disease

## 2019-06-23 NOTE — Telephone Encounter (Signed)
Rx request sent to pharmacy.  

## 2019-06-29 ENCOUNTER — Telehealth: Payer: Self-pay

## 2019-06-29 NOTE — Telephone Encounter (Signed)
-----   Message from Erlene Quan, [place] sent at 06/29/2019  9:17 AM EST ----- If Mr bier is doing well he can be virtual- if he is having chest pain or any other issue he'll need to be seen.  Kerin Ransom PA-C 06/29/2019 9:17 AM

## 2019-06-29 NOTE — Telephone Encounter (Signed)
Left detailed message for patient advising patient to contact office if he is doing ok with no chest pain to consider a virtual visit to prevent the spread of COVID.

## 2019-06-30 ENCOUNTER — Telehealth: Payer: Self-pay | Admitting: *Deleted

## 2019-06-30 ENCOUNTER — Telehealth: Payer: Self-pay | Admitting: Cardiovascular Disease

## 2019-06-30 NOTE — Telephone Encounter (Signed)
[employer] to ship a 14 day cardiac event monitor to the patients home.  Instructions reviewed briefly as they are included in the monitor kit. 

## 2019-06-30 NOTE — Telephone Encounter (Signed)
New message:     Patient calling stating that he has not received his monitor. Please call patient.

## 2019-07-09 ENCOUNTER — Ambulatory Visit (INDEPENDENT_AMBULATORY_CARE_PROVIDER_SITE_OTHER): Payer: PPO

## 2019-07-09 DIAGNOSIS — R002 Palpitations: Secondary | ICD-10-CM

## 2019-07-14 ENCOUNTER — Other Ambulatory Visit (INDEPENDENT_AMBULATORY_CARE_PROVIDER_SITE_OTHER): Payer: Self-pay | Admitting: Nurse Practitioner

## 2019-07-26 ENCOUNTER — Other Ambulatory Visit: Payer: Self-pay | Admitting: Cardiovascular Disease

## 2019-07-27 ENCOUNTER — Other Ambulatory Visit: Payer: Self-pay

## 2019-07-27 ENCOUNTER — Encounter: Payer: Self-pay | Admitting: Cardiology

## 2019-07-27 ENCOUNTER — Ambulatory Visit (INDEPENDENT_AMBULATORY_CARE_PROVIDER_SITE_OTHER): Payer: PPO | Admitting: Cardiology

## 2019-07-27 VITALS — BP 135/73 | HR 57 | Temp 97.3°F | Ht 72.0 in | Wt 215.0 lb

## 2019-07-27 DIAGNOSIS — I10 Essential (primary) hypertension: Secondary | ICD-10-CM | POA: Diagnosis not present

## 2019-07-27 DIAGNOSIS — E785 Hyperlipidemia, unspecified: Secondary | ICD-10-CM | POA: Diagnosis not present

## 2019-07-27 DIAGNOSIS — E119 Type 2 diabetes mellitus without complications: Secondary | ICD-10-CM

## 2019-07-27 DIAGNOSIS — I2510 Atherosclerotic heart disease of native coronary artery without angina pectoris: Secondary | ICD-10-CM | POA: Diagnosis not present

## 2019-07-27 DIAGNOSIS — I208 Other forms of angina pectoris: Secondary | ICD-10-CM | POA: Diagnosis not present

## 2019-07-27 DIAGNOSIS — I2089 Other forms of angina pectoris: Secondary | ICD-10-CM

## 2019-07-27 DIAGNOSIS — I739 Peripheral vascular disease, unspecified: Secondary | ICD-10-CM

## 2019-07-27 DIAGNOSIS — Z9861 Coronary angioplasty status: Secondary | ICD-10-CM

## 2019-07-27 NOTE — Assessment & Plan Note (Signed)
Improved with adjustment of his Imdur 06/21/2019

## 2019-07-27 NOTE — Assessment & Plan Note (Signed)
Controlled.  

## 2019-07-27 NOTE — Progress Notes (Signed)
Cardiology Office Note:    Date:  07/27/2019   ID:  [name], DOB January 16, 1946, MRN [id number]  PCP:  [name], MD  Cardiologist:  Dr Gwenlyn Found Electrophysiologist:  None   Referring MD: [name], MD   CC: follow up after medication adjustment   History of Present Illness:    [name] is a 73 y.o. male with a hx of CAD and has had previous OM-1 PCI in 2012 (three different times). Cath done 08/30/17 revealed ISR of the OM1 with no other significant CAD. His EF was 50-55%. He underwentan unsuccessfulattempt at PCI by Dr Gwenlyn Found and the plan is for medical Rx. Dr Gwenlyn Found saw him in the office 06/21/2019.  The patient complained of chest "tightness" going to his mailbox.  Dr Gwenlyn Found increased his Imdur and the patient is seen to day in follow up. In there interm he has also had a ZIO placed for an "irregular heart rate" notice on his home B/P cuff.   Since his Imdur was increased the patient has had complete resolution of his symptoms.  He walks 2 to 3 miles 5 days a week without chest pain.  He denies any palpitations.  It took him about 2 weeks to get the monitor and he just turned it in a few days ago so these results are not available.  His heart rate is regular on exam.  Past Medical History:  Diagnosis Date  . CHF (congestive heart failure) ([place])   . Coronary artery disease    s/p multiple caths 2012, stenting 1/19 NSTEMI with occlusion of OM, unable to wire-->Rx therapy, normal EF  . Diabetes mellitus   . GERD (gastroesophageal reflux disease)   . Hyperlipidemia   . Hypertension   . MI (myocardial infarction) ([place]) 2019  . PVD (peripheral vascular disease) ([place])    left SFA PTA & stenting in 02/2005 (Dr. Adora Fridge)  . Vitamin D deficiency disease 05/04/2019    Past Surgical History:  Procedure Laterality Date  . CARDIAC CATHETERIZATION  12/23/2004   normal L main, normal LAD, normal L Cfx, RCA with 20% hypodense lesion in first end of vessel (Dr. Adora Fridge)  .  CARDIAC CATHETERIZATION  08/26/2007   no significant CAD by cath, EF 50% (Dr. Jackie Plum)  . CARDIAC CATHETERIZATION  08/28/2010   stent to OM1 with 2.0x68mm BMS (Dr. Adora Fridge)  . CARDIAC CATHETERIZATION  09/11/2010   patent stent (Dr. Roni Bread)  . CARDIAC CATHETERIZATION  02/10/2011   95% prox in-stent restenosis within OM stent - opened with cutting balloon (Dr. Corky Downs)  . CARDIAC CATHETERIZATION  07/17/2011   in-stent restenosis - re-stented with Promus 2.25x47mm DES (Dr. Roni Bread)  . COLONOSCOPY  12/2009   Dr. Hampton Abbot  . COLONOSCOPY N/A 01/30/2017   pancolonic diverticulosis, non-bleeding internal hemorrhoids.  . CORONARY BALLOON ANGIOPLASTY N/A 08/30/2017   Procedure: CORONARY BALLOON ANGIOPLASTY;  Surgeon: [name], MD;  Location: [facility];  Service: Cardiovascular;  Laterality: N/A;  . 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 FEMORAL ARTERY STENT  02/27/2005   L SFA stenting - [place] down SFA across lesion - predilatation with 4x4 Powerflex, stenting with 7x4 Smart, post-dilatation with 6x4 powerflex (Dr. Adora Fridge)  . LEFT HEART CATH AND CORONARY ANGIOGRAPHY N/A 08/30/2017   Procedure: LEFT HEART CATH AND CORONARY ANGIOGRAPHY;  Surgeon: [name], MD;  Location: [facility]  INVASIVE CV LAB;  Service: Cardiovascular;  Laterality: N/A;  . LEFT HEART CATHETERIZATION WITH CORONARY ANGIOGRAM N/A 07/17/2011   Procedure: LEFT HEART CATHETERIZATION WITH CORONARY ANGIOGRAM;  Surgeon: Leonie Man, MD;  Location: [facility] CATH LAB;  Service: Cardiovascular;  Laterality: N/A;  Right radial approach  . left knee arthroscopy  05/2016  . NM MYOCAR PERF WALL MOTION  09/08/2013   abnormal lexiscan - low to intermediate risk;   . TRANSTHORACIC ECHOCARDIOGRAM  09/08/2013   EF 50-55%, mild LVH, grade 1 diastolic dysfunction, mildly calcified AV annulus, calcified MV, LA  mildly dilated,     Current Medications: Current Meds  Medication Sig  . acetaminophen (TYLENOL) 500 MG tablet Take 1,000 mg by mouth every 6 (six) hours as needed for mild pain.  Marland Kitchen aspirin 81 MG chewable tablet Chew 1 tablet (81 mg total) by mouth daily.  . Cholecalciferol (VITAMIN D-3) 125 MCG (5000 UT) TABS Take 1 tablet by mouth daily.  . clopidogrel (PLAVIX) 75 MG tablet TAKE ONE TABLET BY MOUTH ONCE DAILY.  Marland Kitchen Coenzyme Q10 (CO Q-10) 400 MG CAPS Take 400 mg by mouth daily.  . cyclobenzaprine (FLEXERIL) 10 MG tablet Take 10 mg by mouth 3 (three) times daily as needed for muscle spasms.  Marland Kitchen dexlansoprazole (DEXILANT) 60 MG capsule TAKE 1 CAPSULE ONCE DAILY BEFORE BREAKFAST.  Marland Kitchen dextromethorphan-guaiFENesin (MUCINEX DM) 30-600 MG 12hr tablet Take 1 tablet by mouth daily.   . fexofenadine (ALLEGRA) 180 MG tablet Take 180 mg by mouth daily.    Marland Kitchen glipiZIDE-metformin (METAGLIP) 5-500 MG tablet Take 1 tablet by mouth every other day.  . isosorbide mononitrate (IMDUR) 60 MG 24 hr tablet Take 1 tablet (60 mg total) by mouth daily.  Marland Kitchen lisinopril (PRINIVIL,ZESTRIL) 5 MG tablet Take 5 mg by mouth daily.  Marland Kitchen loratadine (CLARITIN) 10 MG tablet Take 10 mg by mouth daily.  . metoprolol succinate (TOPROL-XL) 25 MG 24 hr tablet TAKE 2 TABLETS BY MOUTH ONCE DAILY.  . nitroGLYCERIN (NITROSTAT) 0.4 MG SL tablet Place 1 tablet (0.4 mg total) under the tongue every 5 (five) minutes as needed. For chest pain  . ONETOUCH ULTRA test strip USE AS DIRECTED UP TO 3 TIMES DAILY IF NEEDED.  . ranolazine (RANEXA) 500 MG 12 hr tablet Take 500 mg by mouth daily.  . rosuvastatin (CRESTOR) 20 MG tablet TAKE 1 TABLET ONCE DAILY AT 6 P.M.  . [DISCONTINUED] glipiZIDE-metformin (METAGLIP) 5-500 MG tablet Take 1 tablet by mouth every other day.      Allergies:   Propoxyphene n-acetaminophen, Statins, Tape, Zetia [ezetimibe], Flomax [tamsulosin hcl], Levaquin [levofloxacin hemihydrate], and Penicillins   Social History    Socioeconomic History  . Marital status: Married    Spouse name: Not on file  . Number of children: 3  . Years of education: Not on file  . Highest education level: Not on file  Occupational History  . Occupation: Retired    Fish farm manager: [employer]  Tobacco Use  . Smoking status: Former Smoker    Years: 50.00    Quit date: 05/12/1999    Years since quitting: 20.2  . Smokeless tobacco: Never Used  Substance and Sexual Activity  . Alcohol use: No  . Drug use: No  . Sexual activity: Never  Other Topics Concern  . 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   Social Determinants of Health   Financial Resource Strain:   . Difficulty of Paying Living  Expenses: Not on file  Food Insecurity:   . Worried About Charity fundraiser in the Last Year: Not on file  . Ran Out of Food in the Last Year: Not on file  Transportation Needs:   . Lack of Transportation (Medical): Not on file  . Lack of Transportation (Non-Medical): Not on file  Physical Activity:   . Days of Exercise per Week: Not on file  . Minutes of Exercise per Session: Not on file  Stress:   . Feeling of Stress : Not on file  Social Connections:   . Frequency of Communication with Friends and Family: Not on file  . Frequency of Social Gatherings with Friends and Family: Not on file  . Attends Religious Services: Not on file  . Active Member of Clubs or Organizations: Not on file  . Attends Archivist Meetings: Not on file  . Marital Status: Not on file     Family History: The patient's family history includes Arrhythmia (age of onset: 10) in his mother. There is no history of Colon cancer, Liver disease, Inflammatory bowel disease, or Colon polyps.  ROS:   Please see the history of present illness.     All other systems reviewed and are negative.  EKGs/Labs/Other Studies Reviewed:    The following  studies were reviewed today:   EKG:  EKG is not ordered today.    Recent Labs: 05/04/2019: ALT 24; BUN 15; Creat 1.16; Hemoglobin 12.0; Platelets 156; Potassium 4.0; Sodium 140; TSH 0.65  Recent Lipid Panel    Component Value Date/Time   CHOL 125 05/04/2019 1111   CHOL 117 03/22/2019 0834   TRIG 141 05/04/2019 1111   HDL 38 (L) 05/04/2019 1111   HDL 41 03/22/2019 0834   CHOLHDL 3.3 05/04/2019 1111   VLDL 27 08/30/2017 0315   LDLCALC 65 05/04/2019 1111   LDLDIRECT 66 04/13/2013 0732    Physical Exam:    VS:  BP 135/73   Pulse (!) 57   Temp (!) 97.3 F (36.3 C) (Temporal)   Ht 6' (1.829 m)   Wt 215 lb (97.5 kg)   SpO2 98%   BMI 29.16 kg/m     Wt Readings from Last 3 Encounters:  07/27/19 215 lb (97.5 kg)  06/21/19 210 lb (95.3 kg)  05/26/19 213 lb 12.8 oz (97 kg)     GEN:  Well nourished, well developed in no acute distress HEENT: Normal NECK: No JVD; No carotid bruits CARDIAC: RRR, no murmurs, rubs, gallops RESPIRATORY:  Clear to auscultation without rales, wheezing or rhonchi  ABDOMEN: Soft, non-tender, non-distended MUSCULOSKELETAL:  No edema; No deformity  SKIN: Warm and dry NEUROLOGIC:  Alert and oriented x 3 PSYCHIATRIC:  Normal affect   ASSESSMENT:    CAD S/P percutaneous coronary angioplasty Om1 BMS [id number], ISR July 2012 Rx'd with cutting balloon, ISR again Dec 2012 Rx'd with DES. finally noted to be occluded with an unsuccessful PCI attempt Jan 2019. No significant residual disease. Normal LVF.   Stable angina (HCC) Improved with adjustment of his Imdur 06/21/2019  Essential hypertension Controlled  Peripheral vascular disease ([place]) Lt SFA PTA 2006, dopplers OK Jan 2020  Dyslipidemia LDL 65 Sept 2020 on Crestor 20 mg  Non-insulin treated type 2 diabetes mellitus (HCC) Type II on oral agents  PLAN:    Same Rx- f/u with Dr Gwenlyn Found in 6 months.  I told him we would relay monitor results once they have been read.    Medication  Adjustments/Labs  and Tests Ordered: Current medicines are reviewed at length with the patient today.  Concerns regarding medicines are outlined above.  Orders Placed This Encounter  Procedures  . EKG 12-Lead   No orders of the defined types were placed in this encounter.   Patient Instructions  Medication Instructions:  Your physician recommends that you continue on your current medications as directed. Please refer to the Current Medication list given to you today. *If you need a refill on your cardiac medications before your next appointment, please call your pharmacy*  Lab Work: None  If you have labs (blood work) drawn today and your tests are completely normal, you will receive your results only by: Marland Kitchen MyChart Message (if you have MyChart) OR . A paper copy in the mail If you have any lab test that is abnormal or we need to change your treatment, we will call you to review the results.  Testing/Procedures: None   Follow-Up: At [facility], you and your health needs are our priority.  As part of our continuing mission to provide you with exceptional heart care, we have created designated Provider Care Teams.  These Care Teams include your primary Cardiologist (physician) and Advanced Practice Providers (APPs -  Physician Assistants and Nurse Practitioners) who all work together to provide you with the care you need, when you need it.  Your next appointment:   6 month(s)  The format for your next appointment:   Virtual Visit   Provider:   Quay Burow, MD  Other Instructions     Signed, Kerin Ransom, PA-C  07/27/2019 10:17 AM    [place]

## 2019-07-27 NOTE — Assessment & Plan Note (Signed)
Om1 BMS Jan2012, ISR July 2012 Rx'd with cutting balloon, ISR again Dec 2012 Rx'd with DES. finally noted to be occluded with an unsuccessful PCI attempt Jan 2019. No significant residual disease. Normal LVF.  

## 2019-07-27 NOTE — Addendum Note (Signed)
Addended by: Ulice Brilliant T on: 07/27/2019 10:23 AM   Modules accepted: Orders

## 2019-07-27 NOTE — Patient Instructions (Signed)
Medication Instructions:  Your physician recommends that you continue on your current medications as directed. Please refer to the Current Medication list given to you today. *If you need a refill on your cardiac medications before your next appointment, please call your pharmacy*  Lab Work: None  If you have labs (blood work) drawn today and your tests are completely normal, you will receive your results only by: Marland Kitchen MyChart Message (if you have MyChart) OR . A paper copy in the mail If you have any lab test that is abnormal or we need to change your treatment, we will call you to review the results.  Testing/Procedures: None   Follow-Up: At [facility], you and your health needs are our priority.  As part of our continuing mission to provide you with exceptional heart care, we have created designated Provider Care Teams.  These Care Teams include your primary Cardiologist (physician) and Advanced Practice Providers (APPs -  Physician Assistants and Nurse Practitioners) who all work together to provide you with the care you need, when you need it.  Your next appointment:   6 month(s)  The format for your next appointment:   Virtual Visit   Provider:   Quay Burow, MD  Other Instructions

## 2019-07-27 NOTE — Assessment & Plan Note (Signed)
LDL 65 Sept 2020 on Crestor 20 mg

## 2019-07-27 NOTE — Assessment & Plan Note (Signed)
Lt SFA PTA 2006, dopplers OK Jan 2020 

## 2019-07-27 NOTE — Assessment & Plan Note (Signed)
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 

## 2019-08-01 ENCOUNTER — Other Ambulatory Visit (HOSPITAL_COMMUNITY)
Admission: RE | Admit: 2019-08-01 | Discharge: 2019-08-01 | Disposition: A | Discharge status: Home / Self Care | Payer: PPO | Source: Ambulatory Visit | Attending: Internal Medicine | Admitting: Internal Medicine

## 2019-08-01 ENCOUNTER — Other Ambulatory Visit: Payer: Self-pay

## 2019-08-01 DIAGNOSIS — Z01812 Encounter for preprocedural laboratory examination: Secondary | ICD-10-CM | POA: Diagnosis not present

## 2019-08-01 DIAGNOSIS — Z20828 Contact with and (suspected) exposure to other viral communicable diseases: Secondary | ICD-10-CM | POA: Diagnosis not present

## 2019-08-01 LAB — SARS CORONAVIRUS 2 (TAT 6-24 HRS): SARS Coronavirus 2: NEGATIVE

## 2019-08-02 ENCOUNTER — Ambulatory Visit (HOSPITAL_COMMUNITY)
Admission: RE | Admit: 2019-08-02 | Discharge: 2019-08-02 | Disposition: A | Discharge status: Home / Self Care | Payer: PPO | Attending: Internal Medicine | Admitting: Internal Medicine

## 2019-08-02 ENCOUNTER — Other Ambulatory Visit: Payer: Self-pay

## 2019-08-02 ENCOUNTER — Encounter (HOSPITAL_COMMUNITY): Payer: Self-pay | Admitting: Internal Medicine

## 2019-08-02 ENCOUNTER — Encounter (HOSPITAL_COMMUNITY)
Admission: RE | Disposition: A | Discharge status: Home / Self Care | Payer: Self-pay | Source: Home / Self Care | Attending: Internal Medicine

## 2019-08-02 DIAGNOSIS — K3189 Other diseases of stomach and duodenum: Secondary | ICD-10-CM | POA: Diagnosis not present

## 2019-08-02 DIAGNOSIS — I2510 Atherosclerotic heart disease of native coronary artery without angina pectoris: Secondary | ICD-10-CM | POA: Insufficient documentation

## 2019-08-02 DIAGNOSIS — I110 Hypertensive heart disease with heart failure: Secondary | ICD-10-CM | POA: Insufficient documentation

## 2019-08-02 DIAGNOSIS — I509 Heart failure, unspecified: Secondary | ICD-10-CM | POA: Diagnosis not present

## 2019-08-02 DIAGNOSIS — Z87891 Personal history of nicotine dependence: Secondary | ICD-10-CM | POA: Insufficient documentation

## 2019-08-02 DIAGNOSIS — Z7982 Long term (current) use of aspirin: Secondary | ICD-10-CM | POA: Insufficient documentation

## 2019-08-02 DIAGNOSIS — R1013 Epigastric pain: Secondary | ICD-10-CM | POA: Insufficient documentation

## 2019-08-02 DIAGNOSIS — E1151 Type 2 diabetes mellitus with diabetic peripheral angiopathy without gangrene: Secondary | ICD-10-CM | POA: Diagnosis not present

## 2019-08-02 DIAGNOSIS — Z7984 Long term (current) use of oral hypoglycemic drugs: Secondary | ICD-10-CM | POA: Diagnosis not present

## 2019-08-02 DIAGNOSIS — E785 Hyperlipidemia, unspecified: Secondary | ICD-10-CM | POA: Diagnosis not present

## 2019-08-02 DIAGNOSIS — I252 Old myocardial infarction: Secondary | ICD-10-CM | POA: Insufficient documentation

## 2019-08-02 DIAGNOSIS — Z7902 Long term (current) use of antithrombotics/antiplatelets: Secondary | ICD-10-CM | POA: Diagnosis not present

## 2019-08-02 DIAGNOSIS — K219 Gastro-esophageal reflux disease without esophagitis: Secondary | ICD-10-CM | POA: Insufficient documentation

## 2019-08-02 DIAGNOSIS — Z955 Presence of coronary angioplasty implant and graft: Secondary | ICD-10-CM | POA: Insufficient documentation

## 2019-08-02 DIAGNOSIS — D509 Iron deficiency anemia, unspecified: Secondary | ICD-10-CM | POA: Diagnosis not present

## 2019-08-02 DIAGNOSIS — Z79899 Other long term (current) drug therapy: Secondary | ICD-10-CM | POA: Diagnosis not present

## 2019-08-02 DIAGNOSIS — E559 Vitamin D deficiency, unspecified: Secondary | ICD-10-CM | POA: Diagnosis not present

## 2019-08-02 DIAGNOSIS — K319 Disease of stomach and duodenum, unspecified: Secondary | ICD-10-CM | POA: Insufficient documentation

## 2019-08-02 DIAGNOSIS — D508 Other iron deficiency anemias: Secondary | ICD-10-CM

## 2019-08-02 HISTORY — PX: ESOPHAGOGASTRODUODENOSCOPY: SHX5428

## 2019-08-02 HISTORY — PX: BIOPSY: SHX5522

## 2019-08-02 LAB — GLUCOSE, CAPILLARY: Glucose-Capillary: 144 mg/dL — ABNORMAL HIGH (ref 70–99)

## 2019-08-02 SURGERY — EGD (ESOPHAGOGASTRODUODENOSCOPY)
Anesthesia: Moderate Sedation

## 2019-08-02 MED ORDER — ONDANSETRON HCL 4 MG/2ML IJ SOLN
INTRAMUSCULAR | Status: DC | PRN
  Administered 2019-08-02: 4 mg via INTRAVENOUS

## 2019-08-02 MED ORDER — MIDAZOLAM HCL 5 MG/5ML IJ SOLN
INTRAMUSCULAR | Status: AC
  Filled 2019-08-02: qty 10

## 2019-08-02 MED ORDER — ONDANSETRON HCL 4 MG/2ML IJ SOLN
INTRAMUSCULAR | Status: AC
  Filled 2019-08-02: qty 2

## 2019-08-02 MED ORDER — MEPERIDINE HCL 100 MG/ML IJ SOLN
INTRAMUSCULAR | Status: DC | PRN
  Administered 2019-08-02: 25 mg via INTRAVENOUS
  Administered 2019-08-02: 15 mg via INTRAVENOUS

## 2019-08-02 MED ORDER — LIDOCAINE VISCOUS HCL 2 % MT SOLN
OROMUCOSAL | Status: AC
  Filled 2019-08-02: qty 15

## 2019-08-02 MED ORDER — LIDOCAINE VISCOUS HCL 2 % MT SOLN
OROMUCOSAL | Status: DC | PRN
  Administered 2019-08-02: 5 mL via OROMUCOSAL

## 2019-08-02 MED ORDER — SODIUM CHLORIDE 0.9 % IV SOLN: INTRAVENOUS | Status: DC

## 2019-08-02 MED ORDER — MEPERIDINE HCL 50 MG/ML IJ SOLN
INTRAMUSCULAR | Status: AC
  Filled 2019-08-02: qty 1

## 2019-08-02 MED ORDER — MIDAZOLAM HCL 5 MG/5ML IJ SOLN
INTRAMUSCULAR | Status: DC | PRN
  Administered 2019-08-02 (×2): 2 mg via INTRAVENOUS

## 2019-08-02 MED ORDER — STERILE WATER FOR IRRIGATION IR SOLN
Status: DC | PRN
  Administered 2019-08-02: 1.5 mL

## 2019-08-02 NOTE — Op Note (Signed)
[facility] Patient Name: [name] Procedure Date: 08/02/2019 8:15 AM MRN: [id number] Date of Birth: [birth year] Attending MD: Norvel Richards , MD CSN: [id number] Age: 73 Admit Type: Outpatient Procedure:                Upper GI endoscopy Indications:              Iron deficiency anemia, Dyspepsia Providers:                Norvel Richards, MD, Jeanann Lewandowsky. Gwenlyn Perking RN, RN,                            Randa Spike, Technician Referring MD:              Medicines:                Midazolam 4 mg IV, Meperidine 40 mg IV, Ondansetron                            4 mg IV Complications:            No immediate complications. Estimated Blood Loss:     Estimated blood loss was minimal. Procedure:                Pre-Anesthesia Assessment:                           - Prior to the procedure, a History and Physical                            was performed, and patient medications and                            allergies were reviewed. The patient's tolerance of                            previous anesthesia was also reviewed. The risks                            and benefits of the procedure and the sedation                            options and risks were discussed with the patient.                            All questions were answered, and informed consent                            was obtained. Prior Anticoagulants: The patient has                            taken no previous anticoagulant or antiplatelet                            agents. ASA Grade Assessment: II - A patient with  mild systemic disease. After reviewing the risks                            and benefits, the patient was deemed in                            satisfactory condition to undergo the procedure.                           After obtaining informed consent, the endoscope was                            passed under direct vision. Throughout the                            procedure,  the patient's blood pressure, pulse, and                            oxygen saturations were monitored continuously. The                            [id number]) was introduced through the                            mouth, and advanced to the second part of duodenum.                            The upper GI endoscopy was accomplished without                            difficulty. The patient tolerated the procedure                            well. Scope In: 8:47:21 AM Scope Out: 8:52:52 AM Total Procedure Duration: 0 hours 5 minutes 31 seconds  Findings:      The examined esophagus was normal.      Patchy mildly erythematous mucosa without bleeding was found in the       entire examined stomach. No ulcer or infiltrating process observed.      The duodenal bulb and second portion of the duodenum were normal. The       abnormal appearing gastric mucosa was biopsied with a cold forceps for       histology. Estimated blood loss was minimal. Impression:               - Normal esophagus. - Erythematous mucosa in the                            stomach (mild). Status post biopsy                           - Normal duodenal bulb and second portion of the                            duodenum. No significant findings on today's  examination. If he has H. pylori, will need to be                            treated. If not, would consider updating                            colonoscopy the first of the years has been 2-1/2                            years since his last examination and new onset                            anemia. Moderate Sedation:      Moderate (conscious) sedation was administered by the endoscopy nurse       and supervised by the endoscopist. The following parameters were       monitored: oxygen saturation, heart rate, blood pressure, respiratory       rate, EKG, adequacy of pulmonary ventilation, and response to care. Recommendation:           -  Patient has a contact number available for                            emergencies. The signs and symptoms of potential                            delayed complications were discussed with the                            patient. Return to normal activities tomorrow.                            Written discharge instructions were provided to the                            patient.                           - Resume previous diet.                           - Continue present medications. Follow-up pathology.                           - Return to my office (date not yet determined). Procedure Code(s):        --- Professional ---                           [phone], Esophagogastroduodenoscopy, flexible,                            transoral; with biopsy, single or multiple Diagnosis Code(s):        --- Professional ---                           K31.89, Other diseases of stomach and  duodenum                           D50.9, Iron deficiency anemia, unspecified                           R10.13, Epigastric pain CPT copyright 2019 American Medical Association. All rights reserved. The codes documented in this report are preliminary and upon coder review may  be revised to meet current compliance requirements. Cristopher Estimable. Esli Jernigan, MD Norvel Richards, MD 08/02/2019 9:13:20 AM This report has been signed electronically. Number of Addenda: 0

## 2019-08-02 NOTE — Discharge Instructions (Signed)
EGD Discharge instructions Please read the instructions outlined below and refer to this sheet in the next few weeks. These discharge instructions provide you with general information on caring for yourself after you leave the hospital. Your doctor may also give you specific instructions. While your treatment has been planned according to the most current medical practices available, unavoidable complications occasionally occur. If you have any problems or questions after discharge, please call your doctor. ACTIVITY  You may resume your regular activity but move at a slower pace for the next 24 hours.   Take frequent rest periods for the next 24 hours.   Walking will help expel (get rid of) the air and reduce the bloated feeling in your abdomen.   No driving for 24 hours (because of the anesthesia (medicine) used during the test).   You may shower.   Do not sign any important legal documents or operate any machinery for 24 hours (because of the anesthesia used during the test).  NUTRITION  Drink plenty of fluids.   You may resume your normal diet.   Begin with a light meal and progress to your normal diet.   Avoid alcoholic beverages for 24 hours or as instructed by your caregiver.  MEDICATIONS  You may resume your normal medications unless your caregiver tells you otherwise.  WHAT YOU CAN EXPECT TODAY  You may experience abdominal discomfort such as a feeling of fullness or "gas" pains.  FOLLOW-UP  Your doctor will discuss the results of your test with you.  SEEK IMMEDIATE MEDICAL ATTENTION IF ANY OF THE FOLLOWING OCCUR:  Excessive nausea (feeling sick to your stomach) and/or vomiting.   Severe abdominal pain and distention (swelling).   Trouble swallowing.   Temperature over 101 F (37.8 C).   Rectal bleeding or vomiting of blood.    Continue Dexilant 60 mg daily  Further recommendations to follow pending review of pathology report  You may benefit from a repeat  colonoscopy the first of the year  At patient request, spoke to [facility], son, [phone]

## 2019-08-02 NOTE — H&P (Signed)
@LOGO @   Primary Care Physician:  [name], MD Primary Gastroenterologist:  Dr. Gala Romney  Pre-Procedure History & Physical: HPI:  [name] is a 73 y.o. male here for further evaluation of iron deficiency anemia dyspepsia GERD.  Denies dysphagia.  Past Medical History:  Diagnosis Date  . CHF (congestive heart failure) ([place])   . Coronary artery disease    s/p multiple caths 2012, stenting 1/19 NSTEMI with occlusion of OM, unable to wire-->Rx therapy, normal EF  . Diabetes mellitus   . GERD (gastroesophageal reflux disease)   . Hyperlipidemia   . Hypertension   . MI (myocardial infarction) ([place]) 2019  . PVD (peripheral vascular disease) ([place])    left SFA PTA & stenting in 02/2005 (Dr. Adora Fridge)  . Vitamin D deficiency disease 05/04/2019    Past Surgical History:  Procedure Laterality Date  . CARDIAC CATHETERIZATION  12/23/2004   normal L main, normal LAD, normal L Cfx, RCA with 20% hypodense lesion in first end of vessel (Dr. Adora Fridge)  . CARDIAC CATHETERIZATION  08/26/2007   no significant CAD by cath, EF 50% (Dr. Jackie Plum)  . CARDIAC CATHETERIZATION  08/28/2010   stent to OM1 with 2.0x90mm BMS (Dr. Adora Fridge)  . CARDIAC CATHETERIZATION  09/11/2010   patent stent (Dr. Roni Bread)  . CARDIAC CATHETERIZATION  02/10/2011   95% prox in-stent restenosis within OM stent - opened with cutting balloon (Dr. Corky Downs)  . CARDIAC CATHETERIZATION  07/17/2011   in-stent restenosis - re-stented with Promus 2.25x63mm DES (Dr. Roni Bread)  . COLONOSCOPY  12/2009   Dr. Hampton Abbot  . COLONOSCOPY N/A 01/30/2017   pancolonic diverticulosis, non-bleeding internal hemorrhoids.  . CORONARY BALLOON ANGIOPLASTY N/A 08/30/2017   Procedure: CORONARY BALLOON ANGIOPLASTY;  Surgeon: [name], MD;  Location: [facility];  Service: Cardiovascular;  Laterality: N/A;  . 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 FEMORAL ARTERY STENT  02/27/2005   L SFA stenting - [place] down SFA across lesion - predilatation with 4x4 Powerflex, stenting with 7x4 Smart, post-dilatation with 6x4 powerflex (Dr. Adora Fridge)  . LEFT HEART CATH AND CORONARY ANGIOGRAPHY N/A 08/30/2017   Procedure: LEFT HEART CATH AND CORONARY ANGIOGRAPHY;  Surgeon: [name], MD;  Location: [facility];  Service: Cardiovascular;  Laterality: N/A;  . LEFT HEART CATHETERIZATION WITH CORONARY ANGIOGRAM N/A 07/17/2011   Procedure: LEFT HEART CATHETERIZATION WITH CORONARY ANGIOGRAM;  Surgeon: Leonie Man, MD;  Location: [facility];  Service: Cardiovascular;  Laterality: N/A;  Right radial approach  . left knee arthroscopy  05/2016  . NM MYOCAR PERF WALL MOTION  09/08/2013   abnormal lexiscan - low to intermediate risk;   . TRANSTHORACIC ECHOCARDIOGRAM  09/08/2013   EF 50-55%, mild LVH, grade 1 diastolic dysfunction, mildly calcified AV annulus, calcified MV, LA mildly dilated,     Prior to Admission medications   Medication Sig Start Date End Date Taking? Authorizing Provider  acetaminophen (TYLENOL) 500 MG tablet Take 1,000 mg by mouth every 6 (six) hours as needed for mild pain.   Yes [provider]  aspirin 81 MG chewable tablet Chew 1 tablet (81 mg total) by mouth daily. 09/03/17  Yes Reino Bellis B, NP  Cholecalciferol (VITAMIN D-3) 125 MCG (5000 UT) TABS Take 1 tablet by mouth daily.   Yes [provider]  clopidogrel (PLAVIX) 75 MG tablet TAKE ONE TABLET BY  MOUTH ONCE DAILY. 12/07/15  Yes [name], MD  Coenzyme Q10 (CO Q-10) 400 MG CAPS Take 400 mg by mouth daily.   Yes [provider]  cyclobenzaprine (FLEXERIL) 10 MG tablet Take 10 mg by mouth 3 (three) times daily as needed for muscle spasms.   Yes [provider]  dexlansoprazole (DEXILANT) 60 MG capsule TAKE 1 CAPSULE ONCE DAILY BEFORE BREAKFAST. 05/26/19  Yes Annitta Needs, NP   dextromethorphan-guaiFENesin [facility] DM) 30-600 MG 12hr tablet Take 1 tablet by mouth daily.    Yes [provider]  fexofenadine (ALLEGRA) 180 MG tablet Take 180 mg by mouth daily.     Yes [provider]  glipiZIDE-metformin (METAGLIP) 5-500 MG tablet Take 1 tablet by mouth every other day.   Yes [provider]  isosorbide mononitrate (IMDUR) 60 MG 24 hr tablet Take 1 tablet (60 mg total) by mouth daily. 06/21/19  Yes [name], MD  lisinopril (PRINIVIL,ZESTRIL) 5 MG tablet Take 5 mg by mouth daily.   Yes [provider]  loratadine (CLARITIN) 10 MG tablet Take 10 mg by mouth daily.   Yes [provider]  metoprolol succinate (TOPROL-XL) 25 MG 24 hr tablet TAKE 2 TABLETS BY MOUTH ONCE DAILY. 03/08/19  Yes [name], MD  ONETOUCH ULTRA test strip USE AS DIRECTED UP TO 3 TIMES DAILY IF NEEDED. 07/14/19  Yes Gosrani, Nimish C, MD  ranolazine (RANEXA) 500 MG 12 hr tablet Take 500 mg by mouth daily.   Yes [provider]  rosuvastatin (CRESTOR) 20 MG tablet TAKE 1 TABLET ONCE DAILY AT 6 P.M. 07/26/19  Yes [name], MD  nitroGLYCERIN (NITROSTAT) 0.4 MG SL tablet Place 1 tablet (0.4 mg total) under the tongue every 5 (five) minutes as needed. For chest pain 03/22/19   Lendon Colonel, NP  glipiZIDE-metformin (METAGLIP) 5-500 MG tablet Take 1 tablet by mouth every other day.     [provider]    Allergies as of 05/26/2019 - Review Complete 05/26/2019  Allergen Reaction Noted  . Propoxyphene n-acetaminophen Nausea Only   . Statins Other (See Comments) 07/29/2011  . Tape  07/29/2011  . Zetia [ezetimibe]  08/30/2017  . Flomax [tamsulosin hcl] Rash 08/16/2014  . Levaquin [levofloxacin hemihydrate] Rash 07/29/2011  . Penicillins Rash     Family History  Problem Relation Age of Onset  . Arrhythmia Mother 60  . Colon cancer Neg Hx   . Liver disease Neg Hx   . Inflammatory bowel disease Neg Hx   . Colon  polyps Neg Hx     Social History   Socioeconomic History  . Marital status: Married    Spouse name: Not on file  . Number of children: 3  . Years of education: Not on file  . Highest education level: Not on file  Occupational History  . Occupation: Retired    Fish farm manager: [employer]  Tobacco Use  . Smoking status: Former Smoker    Years: 50.00    Quit date: 05/12/1999    Years since quitting: 20.2  . Smokeless tobacco: Never Used  Substance and Sexual Activity  . Alcohol use: No  . Drug use: No  . Sexual activity: Never  Other Topics Concern  . 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   Social Determinants of Health   Financial Resource Strain:   . Difficulty of Paying Living Expenses: Not on file  Food Insecurity:   . Worried About Charity fundraiser in the Last Year: Not on file  . Ran Out of Food in the Last Year: Not on file  Transportation Needs:   . Lack of Transportation (Medical): Not on file  . Lack of Transportation (Non-Medical): Not on file  Physical Activity:   . Days of Exercise per Week: Not on file  . Minutes of Exercise per Session: Not on file  Stress:   . Feeling of Stress : Not on file  Social Connections:   . Frequency of Communication with Friends and Family: Not on file  . Frequency of Social Gatherings with Friends and Family: Not on file  . Attends Religious Services: Not on file  . Active Member of Clubs or Organizations: Not on file  . Attends Archivist Meetings: Not on file  . Marital Status: Not on file  Intimate Partner Violence:   . Fear of Current or Ex-Partner: Not on file  . Emotionally Abused: Not on file  . Physically Abused: Not on file  . Sexually Abused: Not on file    Review of Systems: See HPI, otherwise negative ROS  Physical Exam: BP 128/66   Pulse (!) 59   Temp 98.1 F (36.7 C) (Oral)   Resp 12   Ht  6' (1.829 m)   SpO2 98%   BMI 29.16 kg/m  General:   Alert,  Well-developed, well-nourished, pleasant and cooperative in NAD Neck:  Supple; no masses or thyromegaly. No significant cervical adenopathy. Lungs:  Clear throughout to auscultation.   No wheezes, crackles, or rhonchi. No acute distress. Heart:  Regular rate and rhythm; no murmurs, clicks, rubs,  or gallops. Abdomen: Non-distended, normal bowel sounds.  Soft and nontender without appreciable mass or hepatosplenomegaly.  Pulses:  Normal pulses noted. Extremities:  Without clubbing or edema.  Impression/Plan: 73 year old with dyspepsia, iron deficiency anemia.  Negative colonoscopy 2-1/2 years ago.  Dyspepsia/reflux somewhat recalcitrant to PPI therapy.  No dysphagia.  Here for diagnostic EGD per plan.  The risks, benefits, limitations, alternatives and imponderables have been reviewed with the patient. Potential for esophageal dilation, biopsy, etc. have also been reviewed.  Questions have been answered. All parties agreeable.     Notice: This dictation was prepared with Dragon dictation along with smaller phrase technology. Any transcriptional errors that result from this process are unintentional and may not be corrected upon review.

## 2019-08-03 ENCOUNTER — Other Ambulatory Visit: Payer: Self-pay

## 2019-08-03 ENCOUNTER — Encounter: Payer: Self-pay | Admitting: Internal Medicine

## 2019-08-03 LAB — SURGICAL PATHOLOGY

## 2019-08-08 ENCOUNTER — Telehealth: Payer: Self-pay

## 2019-08-08 NOTE — Telephone Encounter (Signed)
Per RMR- Send letter to patient.  Send copy of letter with path to referring provider and PCP.   Patient needs an office visit the first of the year to set up TCS.

## 2019-08-10 ENCOUNTER — Encounter: Payer: Self-pay | Admitting: Nurse Practitioner

## 2019-08-10 NOTE — Telephone Encounter (Signed)
OV made and letter mail

## 2019-08-17 ENCOUNTER — Ambulatory Visit (INDEPENDENT_AMBULATORY_CARE_PROVIDER_SITE_OTHER): Payer: PPO | Admitting: Internal Medicine

## 2019-08-17 ENCOUNTER — Encounter (INDEPENDENT_AMBULATORY_CARE_PROVIDER_SITE_OTHER): Payer: Self-pay | Admitting: Internal Medicine

## 2019-08-17 ENCOUNTER — Other Ambulatory Visit: Payer: Self-pay

## 2019-08-17 VITALS — BP 128/66 | HR 55 | Temp 97.6°F | Resp 15 | Ht 72.0 in | Wt 216.4 lb

## 2019-08-17 DIAGNOSIS — E559 Vitamin D deficiency, unspecified: Secondary | ICD-10-CM

## 2019-08-17 DIAGNOSIS — D649 Anemia, unspecified: Secondary | ICD-10-CM | POA: Diagnosis not present

## 2019-08-17 DIAGNOSIS — E119 Type 2 diabetes mellitus without complications: Secondary | ICD-10-CM

## 2019-08-17 DIAGNOSIS — E785 Hyperlipidemia, unspecified: Secondary | ICD-10-CM

## 2019-08-17 DIAGNOSIS — I10 Essential (primary) hypertension: Secondary | ICD-10-CM

## 2019-08-17 NOTE — Progress Notes (Signed)
Metrics: Intervention Frequency ACO  Documented Smoking Status Yearly  Screened one or more times in 24 months  Cessation Counseling or  Active cessation medication Past 24 months  Past 24 months   Guideline developer: UpToDate (See UpToDate for funding source) Date Released: 2014       Wellness Office Visit  Subjective:  Patient ID: [name], male    DOB: [birth year]  Age: 74 y.o. MRN: [id number]  CC: This man comes in for follow-up of diabetes, hypertension, coronary artery disease, recent anemia. HPI  He did have a EGD which was unremarkable and no evidence of H. pylori infection either.  He feels well.  He is due to see gastroenterology again.  Consideration was given to possible repeat colonoscopy although he had 1 2-1/2 years ago. As far as his diabetes is concerned, he continues on the same medication.  His A1c was in a reasonable range on the last visit.  He denies any polyuria or polydipsia. He continues with antihypertensive therapy. He found that a higher dose of Imdur made him feel better and this is probably a reflection of some ischemia from his coronary artery disease. Past Medical History:  Diagnosis Date  . CHF (congestive heart failure) ([place])   . Coronary artery disease    s/p multiple caths 2012, stenting 1/19 NSTEMI with occlusion of OM, unable to wire-->Rx therapy, normal EF  . Diabetes mellitus   . GERD (gastroesophageal reflux disease)   . Hyperlipidemia   . Hypertension   . MI (myocardial infarction) ([place]) 2019  . PVD (peripheral vascular disease) ([place])    left SFA PTA & stenting in 02/2005 (Dr. Adora Fridge)  . Vitamin D deficiency disease 05/04/2019      Family History  Problem Relation Age of Onset  . Arrhythmia Mother 67  . Colon cancer Neg Hx   . Liver disease Neg Hx   . Inflammatory bowel disease Neg Hx   . 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   Social History   Tobacco Use  . Smoking status: Former Smoker    Years: 50.00    Quit date: 05/12/1999    Years since quitting: 20.2  . Smokeless tobacco: Never Used  Substance Use Topics  . Alcohol use: No    Current Meds  Medication Sig  . acetaminophen (TYLENOL) 500 MG tablet Take 1,000 mg by mouth every 6 (six) hours as needed for mild pain.  Marland Kitchen aspirin 81 MG chewable tablet Chew 1 tablet (81 mg total) by mouth daily.  . Cholecalciferol (VITAMIN D-3) 125 MCG (5000 UT) TABS Take 1 tablet by mouth daily.  . clopidogrel (PLAVIX) 75 MG tablet TAKE ONE TABLET BY MOUTH ONCE DAILY.  Marland Kitchen Coenzyme Q10 (CO Q-10) 400 MG CAPS Take 400 mg by mouth daily.  . cyclobenzaprine (FLEXERIL) 10 MG tablet Take 10 mg by mouth 3 (three) times daily as needed for muscle spasms.  Marland Kitchen dexlansoprazole (DEXILANT) 60 MG capsule TAKE 1 CAPSULE ONCE DAILY BEFORE BREAKFAST.  Marland Kitchen dextromethorphan-guaiFENesin (MUCINEX DM) 30-600 MG 12hr tablet Take 1 tablet by mouth daily.   . fexofenadine (ALLEGRA) 180 MG tablet Take 180 mg by mouth daily.    Marland Kitchen glipiZIDE-metformin (METAGLIP) 5-500 MG tablet Take 1 tablet by mouth every other day.  . isosorbide mononitrate (IMDUR) 60 MG 24 hr tablet Take 1 tablet (60 mg total) by mouth daily.  Marland Kitchen  lisinopril (PRINIVIL,ZESTRIL) 5 MG tablet Take 5 mg by mouth daily.  Marland Kitchen loratadine (CLARITIN) 10 MG tablet Take 10 mg by mouth daily.  . metoprolol succinate (TOPROL-XL) 25 MG 24 hr tablet TAKE 2 TABLETS BY MOUTH ONCE DAILY.  . nitroGLYCERIN (NITROSTAT) 0.4 MG SL tablet Place 1 tablet (0.4 mg total) under the tongue every 5 (five) minutes as needed. For chest pain  . ONETOUCH ULTRA test strip USE AS DIRECTED UP TO 3 TIMES DAILY IF NEEDED.  . ranolazine (RANEXA) 500 MG 12 hr tablet Take 500 mg by mouth daily.  . rosuvastatin (CRESTOR) 20 MG tablet TAKE 1 TABLET ONCE DAILY AT 6 P.M.       Objective:   Today's Vitals: BP 128/66   Pulse (!) 55    Temp 97.6 F (36.4 C) (Temporal)   Resp 15   Ht 6' (1.829 m)   Wt 216 lb 6.4 oz (98.2 kg)   SpO2 98%   BMI 29.35 kg/m  Vitals with BMI 08/17/2019 08/02/2019 08/02/2019  Height 6' 0" - -  Weight 216 lbs 6 oz - -  BMI 37.16 - -  Systolic 967 893 810  Diastolic 66 53 62  Pulse 55 61 59     Physical Exam  He looks systemically well.  He has gained about 3 pounds since the last visit.  Blood pressure is well controlled.  He is alert and orientated without any focal neurological signs.     Assessment   1. Essential hypertension   2. Non-insulin treated type 2 diabetes mellitus ([place])   3. Dyslipidemia   4. Vitamin D deficiency disease   5. Anemia, unspecified type       Tests ordered Orders Placed This Encounter  Procedures  . CBC  . CMP with eGFR([facility])  . Lipid Panel  . Hemoglobin A1c  . Vitamin D, 25-hydroxy     Plan: 1. Blood work is ordered as above. 2. He will continue with antihypertensive therapy and his blood pressure is well controlled. 3. He will continue with oral hypoglycemic agents for his diabetes and we will see what the A1c is. 4. He will continue with statin therapy. 5. He will continue with vitamin D3 supplementation.  I will check his hemoglobin again to see if this is improved or not and we will await gastroenterology visit next week.   No orders of the defined types were placed in this encounter.   [name], MD

## 2019-08-18 LAB — COMPLETE METABOLIC PANEL WITH GFR
AG Ratio: 1.3 (calc) (ref 1.0–2.5)
ALT: 19 U/L (ref 9–46)
AST: 22 U/L (ref 10–35)
Albumin: 3.8 g/dL (ref 3.6–5.1)
Alkaline phosphatase (APISO): 44 U/L (ref 35–144)
BUN/Creatinine Ratio: 12 (calc) (ref 6–22)
BUN: 16 mg/dL (ref 7–25)
CO2: 25 mmol/L (ref 20–32)
Calcium: 9.9 mg/dL (ref 8.6–10.3)
Chloride: 105 mmol/L (ref 98–110)
Creat: 1.29 mg/dL — ABNORMAL HIGH (ref 0.70–1.18)
GFR, Est African American: 64 mL/min/{1.73_m2} (ref >=60)
GFR, Est Non African American: 55 mL/min/{1.73_m2} — ABNORMAL LOW (ref >=60)
Globulin: 2.9 g/dL (calc) (ref 1.9–3.7)
Glucose, Bld: 173 mg/dL — ABNORMAL HIGH (ref 65–99)
Potassium: 4.3 mmol/L (ref 3.5–5.3)
Sodium: 139 mmol/L (ref 135–146)
Total Bilirubin: 0.5 mg/dL (ref 0.2–1.2)
Total Protein: 6.7 g/dL (ref 6.1–8.1)

## 2019-08-18 LAB — LIPID PANEL
Cholesterol: 152 mg/dL (ref <200)
HDL: 32 mg/dL — ABNORMAL LOW (ref >=40)
LDL Cholesterol (Calc): 77 mg/dL (calc)
Non-HDL Cholesterol (Calc): 120 mg/dL (calc) (ref <130)
Total CHOL/HDL Ratio: 4.8 (calc) (ref <5.0)
Triglycerides: 358 mg/dL — ABNORMAL HIGH (ref <150)

## 2019-08-18 LAB — CBC
HCT: 36.6 % — ABNORMAL LOW (ref 38.5–50.0)
Hemoglobin: 12.2 g/dL — ABNORMAL LOW (ref 13.2–17.1)
MCH: 32.1 pg (ref 27.0–33.0)
MCHC: 33.3 g/dL (ref 32.0–36.0)
MCV: 96.3 fL (ref 80.0–100.0)
MPV: 11.1 fL (ref 7.5–12.5)
Platelets: 161 10*3/uL (ref 140–400)
RBC: 3.8 10*6/uL — ABNORMAL LOW (ref 4.20–5.80)
RDW: 14 % (ref 11.0–15.0)
WBC: 4.6 10*3/uL (ref 3.8–10.8)

## 2019-08-18 LAB — VITAMIN D 25 HYDROXY (VIT D DEFICIENCY, FRACTURES): Vit D, 25-Hydroxy: 41 ng/mL (ref 30–100)

## 2019-08-18 LAB — HEMOGLOBIN A1C
Hgb A1c MFr Bld: 7.1 % of total Hgb — ABNORMAL HIGH (ref <5.7)
Mean Plasma Glucose: 157 (calc)
eAG (mmol/L): 8.7 (calc)

## 2019-08-24 ENCOUNTER — Encounter: Payer: Self-pay | Admitting: *Deleted

## 2019-08-24 ENCOUNTER — Other Ambulatory Visit: Payer: Self-pay

## 2019-08-24 ENCOUNTER — Telehealth: Payer: Self-pay | Admitting: *Deleted

## 2019-08-24 ENCOUNTER — Encounter: Payer: Self-pay | Admitting: Nurse Practitioner

## 2019-08-24 ENCOUNTER — Ambulatory Visit: Payer: PPO | Admitting: Nurse Practitioner

## 2019-08-24 ENCOUNTER — Encounter: Payer: Self-pay | Admitting: Gastroenterology

## 2019-08-24 VITALS — BP 124/65 | HR 59 | Temp 96.8°F | Ht 72.0 in | Wt 215.4 lb

## 2019-08-24 DIAGNOSIS — K5900 Constipation, unspecified: Secondary | ICD-10-CM | POA: Diagnosis not present

## 2019-08-24 DIAGNOSIS — D508 Other iron deficiency anemias: Secondary | ICD-10-CM

## 2019-08-24 NOTE — Progress Notes (Signed)
Cc'ed to pcp °

## 2019-08-24 NOTE — H&P (View-Only) (Signed)
Referring Provider: [name], MD Primary Care Physician:  [name], MD Primary GI:  Dr. Gala Romney  Chief Complaint  Patient presents with  . Colonoscopy    consult, doing ok    HPI:   [name] is a 74 y.o. male who presents to schedule colonoscopy.  Nurse/phone triage was deferred office visit due to medications likely necessitating augmented sedation.  It appears the patient was referred to our office due to anemia from primary care.  The patient was seen for this in our office 05/26/2019 and was apparently a new onset IDA with hemoglobin recently 12 which is down from the 13/14 range and low ferritin at 34, low iron of 41, iron sats 13%.  Folate normal and B12 was 303.  Last colonoscopy completed June 2018 due to heme positive stool which found pancolonic diverticulosis and nonbleeding internal hemorrhoids.  Recommended no further exam due to age unless symptoms develop.  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  At his last visit he denied rectal bleeding, melena, hematochezia.  No abdominal pain, nausea/vomiting, dysphagia.  He has had chronic GERD which was ongoing and have gotten worse over the years.  He is taking Dexilant once daily.  Decreased appetite.  Takes Motrin and Tylenol sparingly in the past until 2 to 3 months prior when he began taking Motrin daily due to knee pain.  Recommended update upper endoscopy, continue Dexilant, if EGD negative consider capsule study for possible early interval colonoscopy.  EGD completed 08/02/2019 which found normal esophagus, erythematous mucosa in the stomach status post biopsy, normal duodenum.  Surgical pathology found the biopsies to be reactive gastropathy consistent with PPI effect and no H. pylori.  Recommended consider updating colonoscopy at the first of the year.  Most recent CBC completed 08/17/2019 which found hemoglobin stable but low at 12.2.  CMP at that time  also found mild bump in creatinine at 1.29.  Today he states he's doing well overall. Denies abdominal pain, N/V, hematochezia, melena, fever, chills, unintentional weight loss. Is having constipation, has a bowel movement about every 3-4 days unless he takes something. Typically takes Dulcolax, which is effective. Has to take this about once a week. Has not tried anything else OTC for constipation. Drinks about 6-7 glasses of water a day. Eats a lot of oranges, lettuce, greens. When he does have a bowel movement he has hard stools and straining. Denies URI or flu-like symptoms. Denies loss of sense of taste or smell. Denies chest pain, dyspnea, dizziness, lightheadedness, syncope, near syncope. Denies any other upper or lower GI symptoms.  Switches between Dexilant and Protonix, which manages his GERD symptoms well.  Past Medical History:  Diagnosis Date  . CHF (congestive heart failure) ([place])   . Coronary artery disease    s/p multiple caths 2012, stenting 1/19 NSTEMI with occlusion of OM, unable to wire-->Rx therapy, normal EF  . Diabetes mellitus   . GERD (gastroesophageal reflux disease)   . Hyperlipidemia   . Hypertension   . MI (myocardial infarction) ([place]) 2019  . PVD (peripheral vascular disease) ([place])    left SFA PTA & stenting in 02/2005 (Dr. Adora Fridge)  . Vitamin D deficiency disease 05/04/2019    Past Surgical History:  Procedure Laterality Date  . BIOPSY  08/02/2019   Procedure: BIOPSY;  Surgeon: Daneil Dolin, MD;  Location: [facility];  Service: Endoscopy;;  gastric   . CARDIAC CATHETERIZATION  12/23/2004   normal L main, normal LAD, normal L Cfx, RCA with 20% hypodense lesion in first end of vessel (Dr. Adora Fridge)  . CARDIAC CATHETERIZATION  08/26/2007   no significant CAD by cath, EF 50% (Dr. Jackie Plum)  . CARDIAC CATHETERIZATION  08/28/2010   stent to OM1 with 2.0x27mm BMS (Dr. Adora Fridge)  . CARDIAC CATHETERIZATION  09/11/2010   patent stent (Dr. Roni Bread)  . CARDIAC  CATHETERIZATION  02/10/2011   95% prox in-stent restenosis within OM stent - opened with cutting balloon (Dr. Corky Downs)  . CARDIAC CATHETERIZATION  07/17/2011   in-stent restenosis - re-stented with Promus 2.25x60mm DES (Dr. Roni Bread)  . COLONOSCOPY  12/2009   Dr. Hampton Abbot  . COLONOSCOPY N/A 01/30/2017   pancolonic diverticulosis, non-bleeding internal hemorrhoids.  . CORONARY BALLOON ANGIOPLASTY N/A 08/30/2017   Procedure: CORONARY BALLOON ANGIOPLASTY;  Surgeon: [name], MD;  Location: [facility];  Service: Cardiovascular;  Laterality: N/A;  . 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 ESOPHAGOGASTRODUODENOSCOPY N/A 08/02/2019   Procedure: ESOPHAGOGASTRODUODENOSCOPY (EGD);  Surgeon: Daneil Dolin, MD;  Location: [facility];  Service: Endoscopy;  Laterality: N/A;  8:45am  . FEMORAL ARTERY STENT  02/27/2005   L SFA stenting - [place] down SFA across lesion - predilatation with 4x4 Powerflex, stenting with 7x4 Smart, post-dilatation with 6x4 powerflex (Dr. Adora Fridge)  . LEFT HEART CATH AND CORONARY ANGIOGRAPHY N/A 08/30/2017   Procedure: LEFT HEART CATH AND CORONARY ANGIOGRAPHY;  Surgeon: [name], MD;  Location: [facility];  Service: Cardiovascular;  Laterality: N/A;  . LEFT HEART CATHETERIZATION WITH CORONARY ANGIOGRAM N/A 07/17/2011   Procedure: LEFT HEART CATHETERIZATION WITH CORONARY ANGIOGRAM;  Surgeon: Leonie Man, MD;  Location: [facility];  Service: Cardiovascular;  Laterality: N/A;  Right radial approach  . left knee arthroscopy  05/2016  . NM MYOCAR PERF WALL MOTION  09/08/2013   abnormal lexiscan - low to intermediate risk;   . TRANSTHORACIC ECHOCARDIOGRAM  09/08/2013   EF 50-55%, mild LVH, grade 1 diastolic dysfunction, mildly calcified AV annulus, calcified MV, LA mildly dilated,     Current Outpatient Medications    Medication Sig Dispense Refill  . acetaminophen (TYLENOL) 500 MG tablet Take 1,000 mg by mouth every 6 (six) hours as needed for mild pain.    Marland Kitchen aspirin 81 MG chewable tablet Chew 1 tablet (81 mg total) by mouth daily.    . Cholecalciferol (VITAMIN D-3) 125 MCG (5000 UT) TABS Take 1 tablet by mouth daily.    . clopidogrel (PLAVIX) 75 MG tablet TAKE ONE TABLET BY MOUTH ONCE DAILY. 30 tablet 9  . Coenzyme Q10 (CO Q-10) 400 MG CAPS Take 400 mg by mouth daily.    . cyclobenzaprine (FLEXERIL) 10 MG tablet Take 10 mg by mouth 3 (three) times daily as needed for muscle spasms.    Marland Kitchen dexlansoprazole (DEXILANT) 60 MG capsule TAKE 1 CAPSULE ONCE DAILY BEFORE BREAKFAST. 90 capsule 3  . dextromethorphan-guaiFENesin (MUCINEX DM) 30-600 MG 12hr tablet Take 1 tablet by mouth as needed.     . fexofenadine (ALLEGRA) 180 MG tablet Take 180 mg by mouth daily.      Marland Kitchen glipiZIDE-metformin (METAGLIP) 5-500 MG tablet Take 1 tablet by mouth every other day.    . isosorbide mononitrate (IMDUR) 60 MG 24 hr tablet Take 1 tablet (60 mg total) by mouth daily. 90 tablet 3  .  lisinopril (PRINIVIL,ZESTRIL) 5 MG tablet Take 5 mg by mouth daily.    Marland Kitchen loratadine (CLARITIN) 10 MG tablet Take 10 mg by mouth daily.    . metoprolol succinate (TOPROL-XL) 25 MG 24 hr tablet TAKE 2 TABLETS BY MOUTH ONCE DAILY. 180 tablet 0  . nitroGLYCERIN (NITROSTAT) 0.4 MG SL tablet Place 1 tablet (0.4 mg total) under the tongue every 5 (five) minutes as needed. For chest pain 25 tablet 3  . ONETOUCH ULTRA test strip USE AS DIRECTED UP TO 3 TIMES DAILY IF NEEDED. 100 strip 0  . ranolazine (RANEXA) 500 MG 12 hr tablet Take 500 mg by mouth 2 (two) times daily.     . rosuvastatin (CRESTOR) 20 MG tablet TAKE 1 TABLET ONCE DAILY AT 6 P.M. 30 tablet 2   No current facility-administered medications for this visit.    Allergies as of 08/24/2019 - Review Complete 08/24/2019  Allergen Reaction Noted  . Propoxyphene n-acetaminophen Nausea Only   . Statins  Other (See Comments) 07/29/2011  . Tape  07/29/2011  . Zetia [ezetimibe]  08/30/2017  . Flomax [tamsulosin hcl] Rash 08/16/2014  . Levaquin [levofloxacin hemihydrate] Rash 07/29/2011  . Penicillins Rash     Family History  Problem Relation Age of Onset  . Arrhythmia Mother 49  . Colon cancer Neg Hx   . Liver disease Neg Hx   . Inflammatory bowel disease Neg Hx   . Colon polyps Neg Hx     Social History   Socioeconomic History  . Marital status: Married    Spouse name: Not on file  . Number of children: 3  . Years of education: Not on file  . Highest education level: Not on file  Occupational History  . Occupation: Retired    Fish farm manager: [employer]  Tobacco Use  . Smoking status: Former Smoker    Years: 50.00    Quit date: 05/12/1999    Years since quitting: 20.2  . Smokeless tobacco: Never Used  Substance and Sexual Activity  . Alcohol use: No  . Drug use: No  . Sexual activity: Never  Other Topics Concern  . 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   Social Determinants of Health   Financial Resource Strain:   . Difficulty of Paying Living Expenses: Not on file  Food Insecurity:   . Worried About Charity fundraiser in the Last Year: Not on file  . Ran Out of Food in the Last Year: Not on file  Transportation Needs:   . Lack of Transportation (Medical): Not on file  . Lack of Transportation (Non-Medical): Not on file  Physical Activity:   . Days of Exercise per Week: Not on file  . Minutes of Exercise per Session: Not on file  Stress:   . Feeling of Stress : Not on file  Social Connections:   . Frequency of Communication with Friends and Family: Not on file  . Frequency of Social Gatherings with Friends and Family: Not on file  . Attends Religious Services: Not on file  . Active Member of Clubs or Organizations: Not on file  . Attends Archivist  Meetings: Not on file  . Marital Status: Not on file    Review of Systems: General: Negative for anorexia, weight loss, fever, chills, fatigue, weakness. ENT: Negative for hoarseness, difficulty swallowing. CV: Negative for chest pain, angina, palpitations, peripheral edema.  Respiratory: Negative for dyspnea at rest,  cough, sputum, wheezing.  GI: See history of present illness. Endo: Negative for unusual weight change.  Heme: Negative for bruising or bleeding. Allergy: Negative for rash or hives.   Physical Exam: BP 124/65   Pulse (!) 59   Temp (!) 96.8 F (36 C) (Temporal)   Ht 6' (1.829 m)   Wt 215 lb 6.4 oz (97.7 kg)   BMI 29.21 kg/m  General:   Alert and oriented. Pleasant and cooperative. Well-nourished and well-developed.  Eyes:  Without icterus, sclera clear and conjunctiva pink.  Ears:  Normal auditory acuity. Cardiovascular:  S1, S2 present without murmurs appreciated. Extremities without clubbing or edema. Respiratory:  Clear to auscultation bilaterally. No wheezes, rales, or rhonchi. No distress.  Gastrointestinal:  +BS, soft, non-tender and non-distended. No HSM noted. No guarding or rebound. No masses appreciated.  Rectal:  Deferred  Musculoskalatal:  Symmetrical without gross deformities. Neurologic:  Alert and oriented x4;  grossly normal neurologically. Psych:  Alert and cooperative. Normal mood and affect. Heme/Lymph/Immune: No excessive bruising noted.    08/24/2019 8:57 AM   Disclaimer: This note was dictated with voice recognition software. Similar sounding words can inadvertently be transcribed and may not be corrected upon review.

## 2019-08-24 NOTE — Assessment & Plan Note (Signed)
New onset iron deficiency anemia.  EGD was updated 08/02/2019 which found essentially normal and no H. pylori on biopsy.  His last colonoscopy was in 2018 and essentially normal and recommended no routine screening in the future due to age.  Given his normal EGD, new onset anemia, it was recommended to repeat colonoscopy.  We will proceed with scheduling at this time.  Denies overt hematochezia or melena.  To wrap up IDA evaluation, if his colonoscopy is normal, he may need Giv[facility] capsule endoscopy to evaluate his small bowel.  Proceed with TCS with Dr. Gala Romney in near future: the risks, benefits, and alternatives have been discussed with the patient in detail. The patient states understanding and desires to proceed.  The patient is not on any anticoagulants, anxiolytics, chronic pain medications, antidepressants.  He is on glipizide-Metformin every other day.  We will have him hold this the day of his procedure.  Conscious sedation should be adequate for his procedure.

## 2019-08-24 NOTE — Assessment & Plan Note (Signed)
The patient does describe some constipation.  Takes Dulcolax about 1 or 2 times a week.  When he does have a bowel movement, typically every 3 days, his stools are hard and require straining.  At this point he seems to be taking in enough water and fiber.  I will try to get him on a more scheduled regimen to help prevent constipation rather than major creatinine..  Start Colace 100 mg once a day.  Use MiraLAX 1-2 times a day as needed for bowel regularity and softer stools.  Call for any worsening or severe symptoms and follow-up in 6 months otherwise.

## 2019-08-24 NOTE — Progress Notes (Signed)
Referring Provider: [name], MD Primary Care Physician:  [name], MD Primary GI:  Dr. Gala Romney  Chief Complaint  Patient presents with  . Colonoscopy    consult, doing ok    HPI:   [name] is a 74 y.o. male who presents to schedule colonoscopy.  Nurse/phone triage was deferred office visit due to medications likely necessitating augmented sedation.  It appears the patient was referred to our office due to anemia from primary care.  The patient was seen for this in our office 05/26/2019 and was apparently a new onset IDA with hemoglobin recently 12 which is down from the 13/14 range and low ferritin at 34, low iron of 41, iron sats 13%.  Folate normal and B12 was 303.  Last colonoscopy completed June 2018 due to heme positive stool which found pancolonic diverticulosis and nonbleeding internal hemorrhoids.  Recommended no further exam due to age unless symptoms develop.  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  At his last visit he denied rectal bleeding, melena, hematochezia.  No abdominal pain, nausea/vomiting, dysphagia.  He has had chronic GERD which was ongoing and have gotten worse over the years.  He is taking Dexilant once daily.  Decreased appetite.  Takes Motrin and Tylenol sparingly in the past until 2 to 3 months prior when he began taking Motrin daily due to knee pain.  Recommended update upper endoscopy, continue Dexilant, if EGD negative consider capsule study for possible early interval colonoscopy.  EGD completed 08/02/2019 which found normal esophagus, erythematous mucosa in the stomach status post biopsy, normal duodenum.  Surgical pathology found the biopsies to be reactive gastropathy consistent with PPI effect and no H. pylori.  Recommended consider updating colonoscopy at the first of the year.  Most recent CBC completed 08/17/2019 which found hemoglobin stable but low at 12.2.  CMP at that time  also found mild bump in creatinine at 1.29.  Today he states he's doing well overall. Denies abdominal pain, N/V, hematochezia, melena, fever, chills, unintentional weight loss. Is having constipation, has a bowel movement about every 3-4 days unless he takes something. Typically takes Dulcolax, which is effective. Has to take this about once a week. Has not tried anything else OTC for constipation. Drinks about 6-7 glasses of water a day. Eats a lot of oranges, lettuce, greens. When he does have a bowel movement he has hard stools and straining. Denies URI or flu-like symptoms. Denies loss of sense of taste or smell. Denies chest pain, dyspnea, dizziness, lightheadedness, syncope, near syncope. Denies any other upper or lower GI symptoms.  Switches between Dexilant and Protonix, which manages his GERD symptoms well.  Past Medical History:  Diagnosis Date  . CHF (congestive heart failure) ([place])   . Coronary artery disease    s/p multiple caths 2012, stenting 1/19 NSTEMI with occlusion of OM, unable to wire-->Rx therapy, normal EF  . Diabetes mellitus   . GERD (gastroesophageal reflux disease)   . Hyperlipidemia   . Hypertension   . MI (myocardial infarction) ([place]) 2019  . PVD (peripheral vascular disease) ([place])    left SFA PTA & stenting in 02/2005 (Dr. Adora Fridge)  . Vitamin D deficiency disease 05/04/2019    Past Surgical History:  Procedure Laterality Date  . BIOPSY  08/02/2019   Procedure: BIOPSY;  Surgeon: Daneil Dolin, MD;  [facility]: [facility];  Service: Endoscopy;;  gastric   . CARDIAC CATHETERIZATION  12/23/2004   normal L main, normal LAD, normal L Cfx, RCA with 20% hypodense lesion in first end of vessel (Dr. Adora Fridge)  . CARDIAC CATHETERIZATION  08/26/2007   no significant CAD by cath, EF 50% (Dr. Jackie Plum)  . CARDIAC CATHETERIZATION  08/28/2010   stent to OM1 with 2.0x29mm BMS (Dr. Adora Fridge)  . CARDIAC CATHETERIZATION  09/11/2010   patent stent (Dr. Roni Bread)  . CARDIAC  CATHETERIZATION  02/10/2011   95% prox in-stent restenosis within OM stent - opened with cutting balloon (Dr. Corky Downs)  . CARDIAC CATHETERIZATION  07/17/2011   in-stent restenosis - re-stented with Promus 2.25x9mm DES (Dr. Roni Bread)  . COLONOSCOPY  12/2009   Dr. Hampton Abbot  . COLONOSCOPY N/A 01/30/2017   pancolonic diverticulosis, non-bleeding internal hemorrhoids.  . CORONARY BALLOON ANGIOPLASTY N/A 08/30/2017   Procedure: CORONARY BALLOON ANGIOPLASTY;  Surgeon: [name], MD;  Location: [facility];  Service: Cardiovascular;  Laterality: N/A;  . 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 ESOPHAGOGASTRODUODENOSCOPY N/A 08/02/2019   Procedure: ESOPHAGOGASTRODUODENOSCOPY (EGD);  Surgeon: Daneil Dolin, MD;  [facility]: [facility];  Service: Endoscopy;  Laterality: N/A;  8:45am  . FEMORAL ARTERY STENT  02/27/2005   L SFA stenting - [place] down SFA across lesion - predilatation with 4x4 Powerflex, stenting with 7x4 Smart, post-dilatation with 6x4 powerflex (Dr. Adora Fridge)  . LEFT HEART CATH AND CORONARY ANGIOGRAPHY N/A 08/30/2017   Procedure: LEFT HEART CATH AND CORONARY ANGIOGRAPHY;  Surgeon: [name], MD;  Location: [facility];  Service: Cardiovascular;  Laterality: N/A;  . LEFT HEART CATHETERIZATION WITH CORONARY ANGIOGRAM N/A 07/17/2011   Procedure: LEFT HEART CATHETERIZATION WITH CORONARY ANGIOGRAM;  Surgeon: Leonie Man, MD;  Location: [facility];  Service: Cardiovascular;  Laterality: N/A;  Right radial approach  . left knee arthroscopy  05/2016  . NM MYOCAR PERF WALL MOTION  09/08/2013   abnormal lexiscan - low to intermediate risk;   . TRANSTHORACIC ECHOCARDIOGRAM  09/08/2013   EF 50-55%, mild LVH, grade 1 diastolic dysfunction, mildly calcified AV annulus, calcified MV, LA mildly dilated,     Current Outpatient Medications   Medication Sig Dispense Refill  . acetaminophen (TYLENOL) 500 MG tablet Take 1,000 mg by mouth every 6 (six) hours as needed for mild pain.    Marland Kitchen aspirin 81 MG chewable tablet Chew 1 tablet (81 mg total) by mouth daily.    . Cholecalciferol (VITAMIN D-3) 125 MCG (5000 UT) TABS Take 1 tablet by mouth daily.    . clopidogrel (PLAVIX) 75 MG tablet TAKE ONE TABLET BY MOUTH ONCE DAILY. 30 tablet 9  . Coenzyme Q10 (CO Q-10) 400 MG CAPS Take 400 mg by mouth daily.    . cyclobenzaprine (FLEXERIL) 10 MG tablet Take 10 mg by mouth 3 (three) times daily as needed for muscle spasms.    Marland Kitchen dexlansoprazole (DEXILANT) 60 MG capsule TAKE 1 CAPSULE ONCE DAILY BEFORE BREAKFAST. 90 capsule 3  . dextromethorphan-guaiFENesin (MUCINEX DM) 30-600 MG 12hr tablet Take 1 tablet by mouth as needed.     . fexofenadine (ALLEGRA) 180 MG tablet Take 180 mg by mouth daily.      Marland Kitchen glipiZIDE-metformin (METAGLIP) 5-500 MG tablet Take 1 tablet by mouth every other day.    . isosorbide mononitrate (IMDUR) 60 MG 24 hr tablet Take 1 tablet (60 mg total) by mouth daily. 90 tablet 3  .  lisinopril (PRINIVIL,ZESTRIL) 5 MG tablet Take 5 mg by mouth daily.    Marland Kitchen loratadine (CLARITIN) 10 MG tablet Take 10 mg by mouth daily.    . metoprolol succinate (TOPROL-XL) 25 MG 24 hr tablet TAKE 2 TABLETS BY MOUTH ONCE DAILY. 180 tablet 0  . nitroGLYCERIN (NITROSTAT) 0.4 MG SL tablet Place 1 tablet (0.4 mg total) under the tongue every 5 (five) minutes as needed. For chest pain 25 tablet 3  . ONETOUCH ULTRA test strip USE AS DIRECTED UP TO 3 TIMES DAILY IF NEEDED. 100 strip 0  . ranolazine (RANEXA) 500 MG 12 hr tablet Take 500 mg by mouth 2 (two) times daily.     . rosuvastatin (CRESTOR) 20 MG tablet TAKE 1 TABLET ONCE DAILY AT 6 P.M. 30 tablet 2   No current facility-administered medications for this visit.    Allergies as of 08/24/2019 - Review Complete 08/24/2019  Allergen Reaction Noted  . Propoxyphene n-acetaminophen Nausea Only   . Statins  Other (See Comments) 07/29/2011  . Tape  07/29/2011  . Zetia [ezetimibe]  08/30/2017  . Flomax [tamsulosin hcl] Rash 08/16/2014  . Levaquin [levofloxacin hemihydrate] Rash 07/29/2011  . Penicillins Rash     Family History  Problem Relation Age of Onset  . Arrhythmia Mother 57  . Colon cancer Neg Hx   . Liver disease Neg Hx   . Inflammatory bowel disease Neg Hx   . Colon polyps Neg Hx     Social History   Socioeconomic History  . Marital status: Married    Spouse name: Not on file  . Number of children: 3  . Years of education: Not on file  . Highest education level: Not on file  Occupational History  . Occupation: Retired    Fish farm manager: [employer]  Tobacco Use  . Smoking status: Former Smoker    Years: 50.00    Quit date: 05/12/1999    Years since quitting: 20.2  . Smokeless tobacco: Never Used  Substance and Sexual Activity  . Alcohol use: No  . Drug use: No  . Sexual activity: Never  Other Topics Concern  . 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   Social Determinants of Health   Financial Resource Strain:   . Difficulty of Paying Living Expenses: Not on file  Food Insecurity:   . Worried About Charity fundraiser in the Last Year: Not on file  . Ran Out of Food in the Last Year: Not on file  Transportation Needs:   . Lack of Transportation (Medical): Not on file  . Lack of Transportation (Non-Medical): Not on file  Physical Activity:   . Days of Exercise per Week: Not on file  . Minutes of Exercise per Session: Not on file  Stress:   . Feeling of Stress : Not on file  Social Connections:   . Frequency of Communication with Friends and Family: Not on file  . Frequency of Social Gatherings with Friends and Family: Not on file  . Attends Religious Services: Not on file  . Active Member of Clubs or Organizations: Not on file  . Attends Archivist  Meetings: Not on file  . Marital Status: Not on file    Review of Systems: General: Negative for anorexia, weight loss, fever, chills, fatigue, weakness. ENT: Negative for hoarseness, difficulty swallowing. CV: Negative for chest pain, angina, palpitations, peripheral edema.  Respiratory: Negative for dyspnea at rest,  cough, sputum, wheezing.  GI: See history of present illness. Endo: Negative for unusual weight change.  Heme: Negative for bruising or bleeding. Allergy: Negative for rash or hives.   Physical Exam: BP 124/65   Pulse (!) 59   Temp (!) 96.8 F (36 C) (Temporal)   Ht 6' (1.829 m)   Wt 215 lb 6.4 oz (97.7 kg)   BMI 29.21 kg/m  General:   Alert and oriented. Pleasant and cooperative. Well-nourished and well-developed.  Eyes:  Without icterus, sclera clear and conjunctiva pink.  Ears:  Normal auditory acuity. Cardiovascular:  S1, S2 present without murmurs appreciated. Extremities without clubbing or edema. Respiratory:  Clear to auscultation bilaterally. No wheezes, rales, or rhonchi. No distress.  Gastrointestinal:  +BS, soft, non-tender and non-distended. No HSM noted. No guarding or rebound. No masses appreciated.  Rectal:  Deferred  Musculoskalatal:  Symmetrical without gross deformities. Neurologic:  Alert and oriented x4;  grossly normal neurologically. Psych:  Alert and cooperative. Normal mood and affect. Heme/Lymph/Immune: No excessive bruising noted.    08/24/2019 8:57 AM   Disclaimer: This note was dictated with voice recognition software. Similar sounding words can inadvertently be transcribed and may not be corrected upon review.

## 2019-08-24 NOTE — Telephone Encounter (Signed)
Called pt and he is aware of pre-op/covid testing appt/location. Letter mailed.

## 2019-08-24 NOTE — Patient Instructions (Signed)
Your health issues we discussed today were:   Constipation: 1. Start taking Colace 100 mg over-the-counter stool softener once a day 2. You can take MiraLAX once a day in the morning, or as often as twice a day, as needed to keep regular bowel movements that are soft and passes easily 3. Call us if this is not effective for you and we can discuss other options over the phone 4. Call for any worsening or severe symptoms  Anemia: 1. As we discussed your upper endoscopy looked good 2. We will schedule a colonoscopy to further evaluate for possible sources of slow, chronic bleeding 3. Call us if you notice any blood in your stools 4. If your colonoscopy looks normal, as we discussed, we may proceed with a "smart pill" to look at your small intestines 5. Further recommendations to follow  Overall I recommend:  1. Continue your other current medications 2. Return for follow-up in 6 months 3. Call us if you have any questions or concerns.    COVID-19 Vaccine Information can be found at: [email or web address].co.uk For questions related to vaccine distribution or appointments, please email [email or web address] .[email or web address] or call [phone].     At [facility] we value your feedback. You may receive a survey about your visit today. Please share your experience as we strive to create trusting relationships with our patients to provide genuine, compassionate, quality care.  We appreciate your understanding and patience as we review any laboratory studies, imaging, and other diagnostic tests that are ordered as we care for you. Our office policy is 5 business days for review of these results, and any emergent or urgent results are addressed in a timely manner for your best interest. If you do not hear from our office in 1 week, please contact us.   We also encourage the use of MyChart, which contains your medical information for your review  as well. If you are not enrolled in this feature, an access code is on this after visit summary for your convenience. Thank you for allowing [place] to be involved in your care.  It was great to see you today!  I hope you have a Happy New Year!!

## 2019-08-25 ENCOUNTER — Other Ambulatory Visit: Payer: Self-pay | Admitting: Cardiovascular Disease

## 2019-08-25 DIAGNOSIS — I739 Peripheral vascular disease, unspecified: Secondary | ICD-10-CM

## 2019-08-25 DIAGNOSIS — Z95820 Peripheral vascular angioplasty status with implants and grafts: Secondary | ICD-10-CM

## 2019-08-31 ENCOUNTER — Other Ambulatory Visit: Payer: Self-pay

## 2019-08-31 ENCOUNTER — Other Ambulatory Visit (HOSPITAL_COMMUNITY): Payer: Self-pay | Admitting: Cardiovascular Disease

## 2019-08-31 ENCOUNTER — Ambulatory Visit (HOSPITAL_COMMUNITY)
Admission: RE | Admit: 2019-08-31 | Discharge: 2019-08-31 | Disposition: A | Discharge status: Home / Self Care | Payer: PPO | Source: Ambulatory Visit | Attending: Cardiology | Admitting: Cardiology

## 2019-08-31 DIAGNOSIS — Z95820 Peripheral vascular angioplasty status with implants and grafts: Secondary | ICD-10-CM

## 2019-08-31 DIAGNOSIS — I739 Peripheral vascular disease, unspecified: Secondary | ICD-10-CM

## 2019-09-05 ENCOUNTER — Other Ambulatory Visit: Payer: Self-pay | Admitting: Cardiovascular Disease

## 2019-09-06 ENCOUNTER — Other Ambulatory Visit (HOSPITAL_COMMUNITY)
Admission: RE | Admit: 2019-09-06 | Discharge: 2019-09-06 | Disposition: A | Discharge status: Home / Self Care | Payer: PPO | Source: Ambulatory Visit | Attending: Internal Medicine | Admitting: Internal Medicine

## 2019-09-06 ENCOUNTER — Encounter (HOSPITAL_COMMUNITY): Payer: Self-pay

## 2019-09-06 ENCOUNTER — Other Ambulatory Visit: Payer: Self-pay

## 2019-09-06 ENCOUNTER — Encounter (HOSPITAL_COMMUNITY)
Admission: RE | Admit: 2019-09-06 | Discharge: 2019-09-06 | Disposition: A | Discharge status: Home / Self Care | Payer: PPO | Source: Ambulatory Visit | Attending: Internal Medicine | Admitting: Internal Medicine

## 2019-09-06 DIAGNOSIS — Z20822 Contact with and (suspected) exposure to covid-19: Secondary | ICD-10-CM | POA: Insufficient documentation

## 2019-09-06 DIAGNOSIS — Z01812 Encounter for preprocedural laboratory examination: Secondary | ICD-10-CM | POA: Insufficient documentation

## 2019-09-06 LAB — SARS CORONAVIRUS 2 (TAT 6-24 HRS): SARS Coronavirus 2: NEGATIVE

## 2019-09-06 NOTE — Patient Instructions (Signed)
Your procedure is scheduled on: 09/08/2019  Report to [facility] at  9:45   AM.  Call this number if you have problems the morning of surgery: [phone]   Remember:              Follow Directions on the letter you received from Your Physician's office regarding the Bowel Prep              No Smoking the day of Procedure :   Take these medicines the morning of surgery with A SIP OF WATER: Metoprolol, Protonix, Imdur, Renexa, Claritin, and flexeril if needed   Do not wear jewelry, make-up or nail polish.    Do not bring valuables to the hospital.  Contacts, dentures or bridgework may not be worn into surgery.  .   Patients discharged the day of surgery will not be allowed to drive home.     Colonoscopy, Adult, Care After This sheet gives you information about how to care for yourself after your procedure. Your health care provider may also give you more specific instructions. If you have problems or questions, contact your health care provider. What can I expect after the procedure? After the procedure, it is common to have:  A small amount of blood in your stool for 24 hours after the procedure.  Some gas.  Mild abdominal cramping or bloating.  Follow these instructions at home: General instructions   For the first 24 hours after the procedure: ? Do not drive or use machinery. ? Do not sign important documents. ? Do not drink alcohol. ? Do your regular daily activities at a slower pace than normal. ? Eat soft, easy-to-digest foods. ? Rest often.  Take over-the-counter or prescription medicines only as told by your health care provider.  It is up to you to get the results of your procedure. Ask your health care provider, or the department performing the procedure, when your results will be ready. Relieving cramping and bloating  Try walking around when you have cramps or feel bloated.  Apply heat to your abdomen as told by your health care provider. Use a heat  source that your health care provider recommends, such as a moist heat pack or a heating pad. ? Place a towel between your skin and the heat source. ? Leave the heat on for 20-30 minutes. ? Remove the heat if your skin turns bright red. This is especially important if you are unable to feel pain, heat, or cold. You may have a greater risk of getting burned. Eating and drinking  Drink enough fluid to keep your urine clear or pale yellow.  Resume your normal diet as instructed by your health care provider. Avoid heavy or fried foods that are hard to digest.  Avoid drinking alcohol for as long as instructed by your health care provider. Contact a health care provider if:  You have blood in your stool 2-3 days after the procedure. Get help right away if:  You have more than a small spotting of blood in your stool.  You pass large blood clots in your stool.  Your abdomen is swollen.  You have nausea or vomiting.  You have a fever.  You have increasing abdominal pain that is not relieved with medicine. This information is not intended to replace advice given to you by your health care provider. Make sure you discuss any questions you have with your health care provider. Document Released: 03/11/2004 Document Revised: 04/21/2016 Document Reviewed: 10/09/2015 Elsevier  Interactive Patient Education  [employer].

## 2019-09-08 ENCOUNTER — Ambulatory Visit (HOSPITAL_COMMUNITY)
Admission: RE | Admit: 2019-09-08 | Discharge: 2019-09-08 | Disposition: A | Discharge status: Home / Self Care | Payer: PPO | Attending: Internal Medicine | Admitting: Internal Medicine

## 2019-09-08 ENCOUNTER — Ambulatory Visit (HOSPITAL_COMMUNITY): Payer: PPO | Admitting: Anesthesiology

## 2019-09-08 ENCOUNTER — Other Ambulatory Visit: Payer: Self-pay

## 2019-09-08 ENCOUNTER — Encounter (HOSPITAL_COMMUNITY)
Admission: RE | Disposition: A | Discharge status: Home / Self Care | Payer: Self-pay | Source: Home / Self Care | Attending: Internal Medicine

## 2019-09-08 DIAGNOSIS — K219 Gastro-esophageal reflux disease without esophagitis: Secondary | ICD-10-CM | POA: Insufficient documentation

## 2019-09-08 DIAGNOSIS — K5520 Angiodysplasia of colon without hemorrhage: Secondary | ICD-10-CM | POA: Diagnosis not present

## 2019-09-08 DIAGNOSIS — E785 Hyperlipidemia, unspecified: Secondary | ICD-10-CM | POA: Insufficient documentation

## 2019-09-08 DIAGNOSIS — Z7982 Long term (current) use of aspirin: Secondary | ICD-10-CM | POA: Insufficient documentation

## 2019-09-08 DIAGNOSIS — Z7984 Long term (current) use of oral hypoglycemic drugs: Secondary | ICD-10-CM | POA: Insufficient documentation

## 2019-09-08 DIAGNOSIS — K5900 Constipation, unspecified: Secondary | ICD-10-CM | POA: Diagnosis not present

## 2019-09-08 DIAGNOSIS — D508 Other iron deficiency anemias: Secondary | ICD-10-CM

## 2019-09-08 DIAGNOSIS — Z87891 Personal history of nicotine dependence: Secondary | ICD-10-CM | POA: Insufficient documentation

## 2019-09-08 DIAGNOSIS — I110 Hypertensive heart disease with heart failure: Secondary | ICD-10-CM | POA: Insufficient documentation

## 2019-09-08 DIAGNOSIS — Z79899 Other long term (current) drug therapy: Secondary | ICD-10-CM | POA: Diagnosis not present

## 2019-09-08 DIAGNOSIS — D120 Benign neoplasm of cecum: Secondary | ICD-10-CM | POA: Insufficient documentation

## 2019-09-08 DIAGNOSIS — I509 Heart failure, unspecified: Secondary | ICD-10-CM | POA: Insufficient documentation

## 2019-09-08 DIAGNOSIS — I2510 Atherosclerotic heart disease of native coronary artery without angina pectoris: Secondary | ICD-10-CM | POA: Diagnosis not present

## 2019-09-08 DIAGNOSIS — K5730 Diverticulosis of large intestine without perforation or abscess without bleeding: Secondary | ICD-10-CM | POA: Insufficient documentation

## 2019-09-08 DIAGNOSIS — I252 Old myocardial infarction: Secondary | ICD-10-CM | POA: Diagnosis not present

## 2019-09-08 DIAGNOSIS — E1151 Type 2 diabetes mellitus with diabetic peripheral angiopathy without gangrene: Secondary | ICD-10-CM | POA: Insufficient documentation

## 2019-09-08 DIAGNOSIS — D509 Iron deficiency anemia, unspecified: Secondary | ICD-10-CM | POA: Insufficient documentation

## 2019-09-08 DIAGNOSIS — K635 Polyp of colon: Secondary | ICD-10-CM

## 2019-09-08 DIAGNOSIS — Z955 Presence of coronary angioplasty implant and graft: Secondary | ICD-10-CM | POA: Diagnosis not present

## 2019-09-08 HISTORY — PX: POLYPECTOMY: SHX5525

## 2019-09-08 HISTORY — PX: COLONOSCOPY WITH PROPOFOL: SHX5780

## 2019-09-08 LAB — GLUCOSE, CAPILLARY: Glucose-Capillary: 133 mg/dL — ABNORMAL HIGH (ref 70–99)

## 2019-09-08 SURGERY — COLONOSCOPY WITH PROPOFOL
Anesthesia: General

## 2019-09-08 MED ORDER — PROMETHAZINE HCL 25 MG/ML IJ SOLN: 6.2500 mg | INTRAMUSCULAR | Status: DC | PRN

## 2019-09-08 MED ORDER — LIDOCAINE HCL (CARDIAC) PF 100 MG/5ML IV SOSY
PREFILLED_SYRINGE | INTRAVENOUS | Status: DC | PRN
  Administered 2019-09-08: 60 mg via INTRAVENOUS

## 2019-09-08 MED ORDER — MIDAZOLAM HCL 2 MG/2ML IJ SOLN: 0.5000 mg | Freq: Once | INTRAMUSCULAR | Status: DC | PRN

## 2019-09-08 MED ORDER — STERILE WATER FOR IRRIGATION IR SOLN
Status: DC | PRN
  Administered 2019-09-08: 1.5 mL

## 2019-09-08 MED ORDER — PROPOFOL 10 MG/ML IV BOLUS
INTRAVENOUS | Status: AC
  Filled 2019-09-08: qty 20

## 2019-09-08 MED ORDER — CHLORHEXIDINE GLUCONATE CLOTH 2 % EX PADS: 6.0000 | MEDICATED_PAD | Freq: Once | CUTANEOUS | Status: DC

## 2019-09-08 MED ORDER — PHENYLEPHRINE 40 MCG/ML (10ML) SYRINGE FOR IV PUSH (FOR BLOOD PRESSURE SUPPORT)
PREFILLED_SYRINGE | INTRAVENOUS | Status: AC
  Filled 2019-09-08: qty 10

## 2019-09-08 MED ORDER — PROPOFOL 500 MG/50ML IV EMUL
INTRAVENOUS | Status: DC | PRN
  Administered 2019-09-08: 150 ug/kg/min via INTRAVENOUS

## 2019-09-08 MED ORDER — LIDOCAINE 2% (20 MG/ML) 5 ML SYRINGE
INTRAMUSCULAR | Status: AC
  Filled 2019-09-08: qty 5

## 2019-09-08 MED ORDER — PHENYLEPHRINE HCL (PRESSORS) 10 MG/ML IV SOLN
INTRAVENOUS | Status: DC | PRN
  Administered 2019-09-08 (×7): 100 ug via INTRAVENOUS

## 2019-09-08 MED ORDER — HYDROMORPHONE HCL 1 MG/ML IJ SOLN: 0.2500 mg | INTRAMUSCULAR | Status: DC | PRN

## 2019-09-08 MED ORDER — LACTATED RINGERS IV SOLN
INTRAVENOUS | Status: DC
  Administered 2019-09-08: 1000 mL via INTRAVENOUS

## 2019-09-08 NOTE — Anesthesia Postprocedure Evaluation (Signed)
Anesthesia Post Note  Patient: [name]  Procedure(s) Performed: COLONOSCOPY WITH PROPOFOL (N/A ) POLYPECTOMY  Patient location during evaluation: PACU Anesthesia Type: MAC Level of consciousness: awake, oriented and awake and alert Pain management: pain level controlled Vital Signs Assessment: post-procedure vital signs reviewed and stable Respiratory status: spontaneous breathing, respiratory function stable and nonlabored ventilation Cardiovascular status: stable Postop Assessment: no apparent nausea or vomiting Anesthetic complications: no     Last Vitals:  Vitals:   09/08/19 1155 09/08/19 1202  BP: 98/62 93/63  Pulse:  66  Resp:  16  Temp:  36.5 C  SpO2:  100%    Last Pain:  Vitals:   09/08/19 1202  TempSrc: Oral  PainSc: 0-No pain                 Miklos Bidinger

## 2019-09-08 NOTE — Interval H&P Note (Signed)
History and Physical Interval Note:  09/08/2019 10:56 AM  [name]  has presented today for surgery, with the diagnosis of IDA.  The various methods of treatment have been discussed with the patient and family. After consideration of risks, benefits and other options for treatment, the patient has consented to  Procedure(s) with comments: COLONOSCOPY WITH PROPOFOL (N/A) - 11:15am as a surgical intervention.  The patient's history has been reviewed, patient examined, no change in status, stable for surgery.  I have reviewed the patient's chart and labs.  Questions were answered to the patient's satisfaction.     Hiroki Wint  No change.  Diagnostic colonoscopy per plan.  The risks, benefits, limitations, alternatives and imponderables have been reviewed with the patient. Questions have been answered. All parties are agreeable.

## 2019-09-08 NOTE — Op Note (Signed)
[facility] Patient Name: [name] Procedure Date: 09/08/2019 10:52 AM MRN: [id number] Date of Birth: Jun 20, 1946 Attending MD: Norvel Richards , MD CSN: [id number] Age: 74 Admit Type: Outpatient Procedure:                Colonoscopy Indications:              Unexplained iron deficiency anemia Providers:                Norvel Richards, MD, Charlsie Quest. Theda Sers RN, RN,                            Raphael Gibney, Technician Referring MD:              Medicines:                Propofol per Anesthesia Complications:            No immediate complications. Estimated Blood Loss:     Estimated blood loss: none. Procedure:                Pre-Anesthesia Assessment:                           - Prior to the procedure, a History and Physical                            was performed, and patient medications and                            allergies were reviewed. The patient's tolerance of                            previous anesthesia was also reviewed. The risks                            and benefits of the procedure and the sedation                            options and risks were discussed with the patient.                            All questions were answered, and informed consent                            was obtained. Prior Anticoagulants: The patient                            last took Plavix (clopidogrel) 2 days prior to the                            procedure. ASA Grade Assessment: II - A patient                            with mild systemic disease. After reviewing the  risks and benefits, the patient was deemed in                            satisfactory condition to undergo the procedure.                           After obtaining informed consent, the colonoscope                            was passed under direct vision. Throughout the                            procedure, the patient's blood pressure, pulse, and   oxygen saturations were monitored continuously. The                            CF-[id number] scope was introduced through                            the anus and advanced to the the cecum, identified                            by appendiceal orifice and ileocecal valve. The                            colonoscopy was performed without difficulty. The                            patient tolerated the procedure well. The quality                            of the bowel preparation was adequate. Scope In: 11:13:04 AM Scope Out: 11:30:56 AM Scope Withdrawal Time: 0 hours 14 minutes 36 seconds  Total Procedure Duration: 0 hours 17 minutes 52 seconds  Findings:      The perianal and digital rectal examinations were normal.      Scattered medium-mouthed diverticula were found in the sigmoid colon and       descending colon. 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      A 5 mm polyp was found in the ileocecal valve. The polyp was sessile.       The polyp was removed with a cold snare. Resection and retrieval were       complete. Estimated blood loss was minimal. Finally, the AVM was ablated       with the APC circular probe on right colon settings. The base of this       lesion was clipped to ensure hemostasis. Impression:               - Diverticulosis in the sigmoid colon and in the                            descending colon.                           - One 5 mm polyp at the ileocecal valve, removed  with a cold snare. Resected and retrieved. Cecal                            AVM ablated and clipped as described above Moderate Sedation:      Moderate (conscious) sedation was personally administered by an       anesthesia professional. The following parameters were monitored: oxygen       saturation, heart rate, blood pressure, respiratory rate, EKG, adequacy       of pulmonary ventilation, and response to care. Recommendation:           - Patient has a contact  number available for                            emergencies. The signs and symptoms of potential                            delayed complications were discussed with the                            patient. Return to normal activities tomorrow.                            Written discharge instructions were provided to the                            patient.                           - Continue present medications.                           - Repeat colonoscopy date to be determined after                            pending pathology results are reviewed for                            surveillance based on pathology results.                           - Return to GI office in 4 months. No MRI until                            clip gone. Resume Plavix today. Procedure Code(s):        --- Professional ---                           [id number], Colonoscopy, flexible; with removal of                            tumor(s), polyp(s), or other lesion(s) by snare                            technique Diagnosis Code(s):        --- Professional ---  K63.5, Polyp of colon                           D50.9, Iron deficiency anemia, unspecified                           K57.30, Diverticulosis of large intestine without                            perforation or abscess without bleeding CPT copyright 2019 American Medical Association. All rights reserved. The codes documented in this report are preliminary and upon coder review may  be revised to meet current compliance requirements. Cristopher Estimable. Grantley Savage, MD Norvel Richards, MD 09/08/2019 11:41:30 AM This report has been signed electronically. Number of Addenda: 0

## 2019-09-08 NOTE — Discharge Instructions (Signed)
Colonoscopy Discharge Instructions  Read the instructions outlined below and refer to this sheet in the next few weeks. These discharge instructions provide you with general information on caring for yourself after you leave the hospital. Your doctor may also give you specific instructions. While your treatment has been planned according to the most current medical practices available, unavoidable complications occasionally occur. If you have any problems or questions after discharge, call Dr. Gala Romney at [phone]. ACTIVITY  You may resume your regular activity, but move at a slower pace for the next 24 hours.   Take frequent rest periods for the next 24 hours.   Walking will help get rid of the air and reduce the bloated feeling in your belly (abdomen).   No driving for 24 hours (because of the medicine (anesthesia) used during the test).    Do not sign any important legal documents or operate any machinery for 24 hours (because of the anesthesia used during the test).  NUTRITION  Drink plenty of fluids.   You may resume your normal diet as instructed by your doctor.   Begin with a light meal and progress to your normal diet. Heavy or fried foods are harder to digest and may make you feel sick to your stomach (nauseated).   Avoid alcoholic beverages for 24 hours or as instructed.  MEDICATIONS  You may resume your normal medications unless your doctor tells you otherwise.  WHAT YOU CAN EXPECT TODAY  Some feelings of bloating in the abdomen.   Passage of more gas than usual.   Spotting of blood in your stool or on the toilet paper.  IF YOU HAD POLYPS REMOVED DURING THE COLONOSCOPY:  No aspirin products for 7 days or as instructed.   No alcohol for 7 days or as instructed.   Eat a soft diet for the next 24 hours.  FINDING OUT THE RESULTS OF YOUR TEST Not all test results are available during your visit. If your test results are not back during the visit, make an appointment  with your caregiver to find out the results. Do not assume everything is normal if you have not heard from your caregiver or the medical facility. It is important for you to follow up on all of your test results.  SEEK IMMEDIATE MEDICAL ATTENTION IF:  You have more than a spotting of blood in your stool.   Your belly is swollen (abdominal distention).   You are nauseated or vomiting.   You have a temperature over 101.   You have abdominal pain or discomfort that is severe or gets worse throughout the day.    Diverticulosis and colon polyp information provided  No MRI in the future until clip gone  Further recommendations to follow pending review of pathology report  Office visit with [place] in 4 months  Resume Plavix today  At patient request, I called Lovie Macadamia at [phone] and reviewed results    Diverticulosis  Diverticulosis is a condition that develops when small pouches (diverticula) form in the wall of the large intestine (colon). The colon is where water is absorbed and stool (feces) is formed. The pouches form when the inside layer of the colon pushes through weak spots in the outer layers of the colon. You may have a few pouches or many of them. The pouches usually do not cause problems unless they become inflamed or infected. When this happens, the condition is called diverticulitis. What are the causes? The cause of this condition is not known.  What increases the risk? The following factors may make you more likely to develop this condition:  Being older than age 68. Your risk for this condition increases with age. Diverticulosis is rare among people younger than age 53. By age 67, many people have it.  Eating a low-fiber diet.  Having frequent constipation.  Being overweight.  Not getting enough exercise.  Smoking.  Taking over-the-counter pain medicines, like aspirin and ibuprofen.  Having a family history of diverticulosis. What are the signs or  symptoms? In most people, there are no symptoms of this condition. If you do have symptoms, they may include:  Bloating.  Cramps in the abdomen.  Constipation or diarrhea.  Pain in the lower left side of the abdomen. How is this diagnosed? Because diverticulosis usually has no symptoms, it is most often diagnosed during an exam for other colon problems. The condition may be diagnosed by:  Using a flexible scope to examine the colon (colonoscopy).  Taking an X-ray of the colon after dye has been put into the colon (barium enema).  Having a CT scan. How is this treated? You may not need treatment for this condition. Your health care provider may recommend treatment to prevent problems. You may need treatment if you have symptoms or if you previously had diverticulitis. Treatment may include:  Eating a high-fiber diet.  Taking a fiber supplement.  Taking a live bacteria supplement (probiotic).  Taking medicine to relax your colon. Follow these instructions at home: Medicines  Take over-the-counter and prescription medicines only as told by your health care provider.  If told by your health care provider, take a fiber supplement or probiotic. Constipation prevention Your condition may cause constipation. To prevent or treat constipation, you may need to:  Drink enough fluid to keep your urine pale yellow.  Take over-the-counter or prescription medicines.  Eat foods that are high in fiber, such as beans, whole grains, and fresh fruits and vegetables.  Limit foods that are high in fat and processed sugars, such as fried or sweet foods.  General instructions  Try not to strain when you have a bowel movement.  Keep all follow-up visits as told by your health care provider. This is important. Contact a health care provider if you:  Have pain in your abdomen.  Have bloating.  Have cramps.  Have not had a bowel movement in 3 days. Get help right away if:  Your pain  gets worse.  Your bloating becomes very bad.  You have a fever or chills, and your symptoms suddenly get worse.  You vomit.  You have bowel movements that are bloody or black.  You have bleeding from your rectum. Summary  Diverticulosis is a condition that develops when small pouches (diverticula) form in the wall of the large intestine (colon).  You may have a few pouches or many of them.  This condition is most often diagnosed during an exam for other colon problems.  Treatment may include increasing the fiber in your diet, taking supplements, or taking medicines. This information is not intended to replace advice given to you by your health care provider. Make sure you discuss any questions you have with your health care provider. Document Revised: 02/24/2019 Document Reviewed: 02/24/2019 Elsevier Patient Education  [place].    Colon Polyps  Polyps are tissue growths inside the body. Polyps can grow in many places, including the large intestine (colon). A polyp may be a round bump or a mushroom-shaped growth. You could have  one polyp or several. Most colon polyps are noncancerous (benign). However, some colon polyps can become cancerous over time. Finding and removing the polyps early can help prevent this. What are the causes? The exact cause of colon polyps is not known. What increases the risk? You are more likely to develop this condition if you:  Have a family history of colon cancer or colon polyps.  Are older than 82 or older than 45 if you are African American.  Have inflammatory bowel disease, such as ulcerative colitis or Crohn's disease.  Have certain hereditary conditions, such as: ? Familial adenomatous polyposis. ? Lynch syndrome. ? Turcot syndrome. ? Peutz-Jeghers syndrome.  Are overweight.  Smoke cigarettes.  Do not get enough exercise.  Drink too much alcohol.  Eat a diet that is high in fat and red meat and low in fiber.  Had  childhood cancer that was treated with abdominal radiation. What are the signs or symptoms? Most polyps do not cause symptoms. If you have symptoms, they may include:  Blood coming from your rectum when having a bowel movement.  Blood in your stool. The stool may look dark red or black.  Abdominal pain.  A change in bowel habits, such as constipation or diarrhea. How is this diagnosed? This condition is diagnosed with a colonoscopy. This is a procedure in which a lighted, flexible scope is inserted into the anus and then passed into the colon to examine the area. Polyps are sometimes found when a colonoscopy is done as part of routine cancer screening tests. How is this treated? Treatment for this condition involves removing any polyps that are found. Most polyps can be removed during a colonoscopy. Those polyps will then be tested for cancer. Additional treatment may be needed depending on the results of testing. Follow these instructions at home: Lifestyle  Maintain a healthy weight, or lose weight if recommended by your health care provider.  Exercise every day or as told by your health care provider.  Do not use any products that contain nicotine or tobacco, such as cigarettes and e-cigarettes. If you need help quitting, ask your health care provider.  If you drink alcohol, limit how much you have: ? 0-1 drink a day for women. ? 0-2 drinks a day for men.  Be aware of how much alcohol is in your drink. In the [place] one drink equals one 12 oz bottle of beer (355 mL), one 5 oz glass of wine (148 mL), or one 1 oz shot of hard liquor (44 mL). Eating and drinking   Eat foods that are high in fiber, such as fruits, vegetables, and whole grains.  Eat foods that are high in calcium and vitamin D, such as milk, cheese, yogurt, eggs, liver, fish, and broccoli.  Limit foods that are high in fat, such as fried foods and desserts.  Limit the amount of red meat and processed meat you  eat, such as hot dogs, sausage, bacon, and lunch meats. General instructions  Keep all follow-up visits as told by your health care provider. This is important. ? This includes having regularly scheduled colonoscopies. ? Talk to your health care provider about when you need a colonoscopy. Contact a health care provider if:  You have new or worsening bleeding during a bowel movement.  You have new or increased blood in your stool.  You have a change in bowel habits.  You lose weight for no known reason. Summary  Polyps are tissue growths inside the body. Polyps  can grow in many places, including the colon.  Most colon polyps are noncancerous (benign), but some can become cancerous over time.  This condition is diagnosed with a colonoscopy.  Treatment for this condition involves removing any polyps that are found. Most polyps can be removed during a colonoscopy. This information is not intended to replace advice given to you by your health care provider. Make sure you discuss any questions you have with your health care provider. Document Revised: 11/12/2017 Document Reviewed: 11/12/2017 Elsevier Patient Education  [place] After These instructions provide you with information about caring for yourself after your procedure. Your health care provider may also give you more specific instructions. Your treatment has been planned according to current medical practices, but problems sometimes occur. Call your health care provider if you have any problems or questions after your procedure. What can I expect after the procedure? After your procedure, you may:  Feel sleepy for several hours.  Feel clumsy and have poor balance for several hours.  Feel forgetful about what happened after the procedure.  Have poor judgment for several hours.  Feel nauseous or vomit.  Have a sore throat if you had a breathing tube during the  procedure. Follow these instructions at home: For at least 24 hours after the procedure:      Have a responsible adult stay with you. It is important to have someone help care for you until you are awake and alert.  Rest as needed.  Do not: ? Participate in activities in which you could fall or become injured. ? Drive. ? Use heavy machinery. ? Drink alcohol. ? Take sleeping pills or medicines that cause drowsiness. ? Make important decisions or sign legal documents. ? Take care of children on your own. Eating and drinking  Follow the diet that is recommended by your health care provider.  If you vomit, drink water, juice, or soup when you can drink without vomiting.  Make sure you have little or no nausea before eating solid foods. General instructions  Take over-the-counter and prescription medicines only as told by your health care provider.  If you have sleep apnea, surgery and certain medicines can increase your risk for breathing problems. Follow instructions from your health care provider about wearing your sleep device: ? Anytime you are sleeping, including during daytime naps. ? While taking prescription pain medicines, sleeping medicines, or medicines that make you drowsy.  If you smoke, do not smoke without supervision.  Keep all follow-up visits as told by your health care provider. This is important. Contact a health care provider if:  You keep feeling nauseous or you keep vomiting.  You feel light-headed.  You develop a rash.  You have a fever. Get help right away if:  You have trouble breathing. Summary  For several hours after your procedure, you may feel sleepy and have poor judgment.  Have a responsible adult stay with you for at least 24 hours or until you are awake and alert. This information is not intended to replace advice given to you by your health care provider. Make sure you discuss any questions you have with your health care  provider. Document Revised: 10/26/2017 Document Reviewed: 11/18/2015 Elsevier Patient Education  [place].

## 2019-09-08 NOTE — Transfer of Care (Signed)
Immediate Anesthesia Transfer of Care Note  Patient: [name]  Procedure(s) Performed: COLONOSCOPY WITH PROPOFOL (N/A ) POLYPECTOMY  Patient Location: PACU  Anesthesia Type:MAC  Level of Consciousness: awake, alert , oriented and patient cooperative  Airway & Oxygen Therapy: Patient Spontanous Breathing  Post-op Assessment: Report given to RN and Post -op Vital signs reviewed and stable  Post vital signs: Reviewed and stable  Last Vitals:  Vitals Value Taken Time  BP 73/51 09/08/19 1135  Temp 36.5 C 09/08/19 1135  Pulse 62 09/08/19 1135  Resp 15 09/08/19 1135  SpO2 98 % 09/08/19 1135    Last Pain:  Vitals:   09/08/19 1107  TempSrc:   PainSc: 0-No pain      Patients Stated Pain Goal: 9 (66/59/93 [id number])  Complications: No apparent anesthesia complications

## 2019-09-08 NOTE — Anesthesia Preprocedure Evaluation (Signed)
Anesthesia Evaluation  Patient identified by MRN, date of birth, ID band Patient awake    Reviewed: Allergy & Precautions, NPO status , Patient's Chart, lab work & pertinent test results  Airway Mallampati: I  TM Distance: >3 FB Neck ROM: Full    Dental no notable dental hx. (+) Teeth Intact, Chipped Front Top L chipped:   Pulmonary neg pulmonary ROS, former smoker,    Pulmonary exam normal breath sounds clear to auscultation       Cardiovascular Exercise Tolerance: Good hypertension, Pt. on medications and Pt. on home beta blockers (-) angina+ CAD, + Past MI, + Peripheral Vascular Disease and +CHF  Normal cardiovascular examI Rhythm:Regular Rate:Normal  Pt with known MI/Stents and PVD Reports walks 3 miles/day  Denies recent CP On Ranexa/Toprol - states hasn't used SL NTG in over 30 days    Neuro/Psych negative neurological ROS  negative psych ROS   GI/Hepatic Neg liver ROS, GERD  Medicated and Controlled,  Endo/Other  negative endocrine ROSdiabetes, Type 2  Renal/GU negative Renal ROS  negative genitourinary   Musculoskeletal negative musculoskeletal ROS (+)   Abdominal   Peds negative pediatric ROS (+)  Hematology negative hematology ROS (+) anemia , H/H 12/36   Anesthesia Other Findings   Reproductive/Obstetrics negative OB ROS                             Anesthesia Physical Anesthesia Plan  ASA: III  Anesthesia Plan: General   Post-op Pain Management:    Induction: Intravenous  PONV Risk Score and Plan: 2 and Propofol infusion, TIVA and Treatment may vary due to age or medical condition  Airway Management Planned: Simple Face Mask and Nasal Cannula  Additional Equipment:   Intra-op Plan:   Post-operative Plan:   Informed Consent: I have reviewed the patients History and Physical, chart, labs and discussed the procedure including the risks, benefits and alternatives  for the proposed anesthesia with the patient or authorized representative who has indicated his/her understanding and acceptance.     Dental advisory given  Plan Discussed with: CRNA  Anesthesia Plan Comments: (Plan Full PPE use  Plan GA with GETA as needed d/w pt -WTP with same after Q&A)        Anesthesia Quick Evaluation

## 2019-09-09 ENCOUNTER — Encounter: Payer: Self-pay | Admitting: Internal Medicine

## 2019-09-09 LAB — SURGICAL PATHOLOGY

## 2019-09-15 DIAGNOSIS — H40013 Open angle with borderline findings, low risk, bilateral: Secondary | ICD-10-CM | POA: Diagnosis not present

## 2019-09-15 DIAGNOSIS — E119 Type 2 diabetes mellitus without complications: Secondary | ICD-10-CM | POA: Diagnosis not present

## 2019-09-15 DIAGNOSIS — H2513 Age-related nuclear cataract, bilateral: Secondary | ICD-10-CM | POA: Diagnosis not present

## 2019-09-28 ENCOUNTER — Ambulatory Visit: Payer: Self-pay

## 2019-09-29 ENCOUNTER — Telehealth (INDEPENDENT_AMBULATORY_CARE_PROVIDER_SITE_OTHER): Payer: PPO | Admitting: Cardiovascular Disease

## 2019-09-29 DIAGNOSIS — Z955 Presence of coronary angioplasty implant and graft: Secondary | ICD-10-CM | POA: Diagnosis not present

## 2019-09-29 DIAGNOSIS — E785 Hyperlipidemia, unspecified: Secondary | ICD-10-CM

## 2019-09-29 DIAGNOSIS — I739 Peripheral vascular disease, unspecified: Secondary | ICD-10-CM | POA: Diagnosis not present

## 2019-09-29 DIAGNOSIS — I208 Other forms of angina pectoris: Secondary | ICD-10-CM | POA: Diagnosis not present

## 2019-09-29 DIAGNOSIS — I10 Essential (primary) hypertension: Secondary | ICD-10-CM | POA: Diagnosis not present

## 2019-09-29 NOTE — Addendum Note (Signed)
Addended by: Cain Sieve on: 09/29/2019 10:02 AM   Modules accepted: Orders

## 2019-09-29 NOTE — Progress Notes (Signed)
Virtual Visit via Telephone Note   This visit type was conducted due to national recommendations for restrictions regarding the COVID-19 Pandemic (e.g. social distancing) in an effort to limit this patient's exposure and mitigate transmission in our community.  Due to his co-morbid illnesses, this patient is at least at moderate risk for complications without adequate follow up.  This format is felt to be most appropriate for this patient at this time.  The patient did not have access to video technology/had technical difficulties with video requiring transitioning to audio format only (telephone).  All issues noted in this document were discussed and addressed.  No physical exam could be performed with this format.  Please refer to the patient's chart for his  consent to telehealth for [facility].   Date:  09/29/2019   ID:  [name], DOB 06-05-1946, MRN [id number]  Patient Location: Home Provider Location: Home  PCP:  [name], MD  Cardiologist:  Quay Burow, MD  Electrophysiologist:  None   Evaluation Performed:  Follow-Up Visit  Chief Complaint: Follow-up CAD and PAD  History of Present Illness:    [name] is a 74 y.o. mildly overweight married [place] American male father of 3 who I last saw in the/30/19.  He did see Kerin Ransom in the office 06/21/2019 and Jory Sims 03/22/2019.Marland Kitchen He has a history of PVOD status post left SFA, PTA and stenting by myself back in July of 2006 with subsequent improvement in his claudication and Dopplers. These were last done in April of last year and showed ABIs of greater than 1 bilaterally. His other problems include hypertension, hyperlipidemia, non-insulin-requiring diabetes and statin intolerance. I catheterized him August 28, 2010 and stented his first OM branch with a bare metal stent. He was readmitted September 09, 2010 with recurrent chest pain and was re-cathed by Dr. Ellyn Hack via the right radial approach revealing  a widely patent stent. He was admitted again June 29th through July 3rd with chest pain and ruled out for myocardial infarction. His stress test showed subtle lateral ischemia and cath performed by Dr. Claiborne Billings February 10, 2012 revealed 95% proximal in-stent restenosis within the OM stent which Dr. Claiborne Billings opened up with a cutting balloon. He had done well until December of last year when he was admitted again with chest pain. Dr. Ellyn Hack recathed him revealing again in-stent restenosis which was "restented" with a Promus drug-eluting stent. He was having chest pain when I saw him back in December, however, since that time he has had minimal chest pain and he does walk 3-5 miles a day. His most recent liver profile performed 08/22/15 revealed an LDL of 94 and HDL 47 on Zocor 20 mg a day. He was recently admitted with chest pain/rule out MI 08/30/13. This is either negative. 2-D echo was essentially unremarkable with mild inferoseptal hypokinesia and a Myoview stress test was read as low risk. His medications were adjusted. Ranexa was added..He has had no recurrent chest pain since I saw him a year ago until recently when he was taking his Ranexa once a day instead of twice a day.Since I saw him a year ago he deniesclaudication but does complain of new onset chest pain several weeks ago which is exertional Recatheterized him revealing a totally occluded first obtuse marginal branch stent which I was unable to open. The remainder of his coronary anatomy was free of significant disease and his LV function was normal.  The decision was made to treat him  medically at that time with Imdur and regular ranolazine.  Since he was seen 3 months ago I had increased his Imdur from 30 to 60 mg a day which resulted in complete resolution of his chest pain.  He walks 2 to 3 miles every day without symptoms of chest pain, shortness of breath or claudication..  The patient does not have symptoms concerning for COVID-19 infection  (fever, chills, cough, or new shortness of breath).    Past Medical History:  Diagnosis Date  . CHF (congestive heart failure) ([place])   . Coronary artery disease    s/p multiple caths 2012, stenting 1/19 NSTEMI with occlusion of OM, unable to wire-->Rx therapy, normal EF  . Diabetes mellitus   . GERD (gastroesophageal reflux disease)   . Hyperlipidemia   . Hypertension   . MI (myocardial infarction) ([place]) 2019  . PVD (peripheral vascular disease) ([place])    left SFA PTA & stenting in 02/2005 (Dr. Adora Fridge)  . Vitamin D deficiency disease 05/04/2019   Past Surgical History:  Procedure Laterality Date  . BIOPSY  08/02/2019   Procedure: BIOPSY;  Surgeon: Daneil Dolin, MD;  [facility];  Service: Endoscopy;;  gastric   . CARDIAC CATHETERIZATION  12/23/2004   normal L main, normal LAD, normal L Cfx, RCA with 20% hypodense lesion in first end of vessel (Dr. Adora Fridge)  . CARDIAC CATHETERIZATION  08/26/2007   no significant CAD by cath, EF 50% (Dr. Jackie Plum)  . CARDIAC CATHETERIZATION  08/28/2010   stent to OM1 with 2.0x38mm BMS (Dr. Adora Fridge)  . CARDIAC CATHETERIZATION  09/11/2010   patent stent (Dr. Roni Bread)  . CARDIAC CATHETERIZATION  02/10/2011   95% prox in-stent restenosis within OM stent - opened with cutting balloon (Dr. Corky Downs)  . CARDIAC CATHETERIZATION  07/17/2011   in-stent restenosis - re-stented with Promus 2.25x47mm DES (Dr. Roni Bread)  . COLONOSCOPY  12/2009   Dr. Hampton Abbot  . COLONOSCOPY N/A 01/30/2017   pancolonic diverticulosis, non-bleeding internal hemorrhoids.  . COLONOSCOPY WITH PROPOFOL N/A 09/08/2019   Procedure: COLONOSCOPY WITH PROPOFOL;  Surgeon: Daneil Dolin, MD;  [facility];  Service: Endoscopy;  Laterality: N/A;  11:15am  . CORONARY BALLOON ANGIOPLASTY N/A 08/30/2017   Procedure: CORONARY BALLOON ANGIOPLASTY;  Surgeon: [name], MD;  Location: [place] [facility];  Service: Cardiovascular;  Laterality: N/A;  .  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 ESOPHAGOGASTRODUODENOSCOPY N/A 08/02/2019   Procedure: ESOPHAGOGASTRODUODENOSCOPY (EGD);  Surgeon: Daneil Dolin, MD;  [facility];  Service: Endoscopy;  Laterality: N/A;  8:45am  . FEMORAL ARTERY STENT  02/27/2005   L SFA stenting - [place] down SFA across lesion - predilatation with 4x4 Powerflex, stenting with 7x4 Smart, post-dilatation with 6x4 powerflex (Dr. Adora Fridge)  . LEFT HEART CATH AND CORONARY ANGIOGRAPHY N/A 08/30/2017   Procedure: LEFT HEART CATH AND CORONARY ANGIOGRAPHY;  Surgeon: [name], MD;  Location: [place] [facility];  Service: Cardiovascular;  Laterality: N/A;  . LEFT HEART CATHETERIZATION WITH CORONARY ANGIOGRAM N/A 07/17/2011   Procedure: LEFT HEART CATHETERIZATION WITH CORONARY ANGIOGRAM;  Surgeon: Leonie Man, MD;  Location: [facility] CATH LAB;  Service: Cardiovascular;  Laterality: N/A;  Right radial approach  . left knee arthroscopy  05/2016  . NM MYOCAR PERF WALL MOTION  09/08/2013   abnormal lexiscan - low to intermediate risk;   .  POLYPECTOMY  09/08/2019   Procedure: POLYPECTOMY;  Surgeon: Daneil Dolin, MD;  [facility];  Service: Endoscopy;;  . TRANSTHORACIC ECHOCARDIOGRAM  09/08/2013   EF 50-55%, mild LVH, grade 1 diastolic dysfunction, mildly calcified AV annulus, calcified MV, LA mildly dilated,      No outpatient medications have been marked as taking for the 09/29/19 encounter (Appointment) with [name], MD.     Allergies:   Propoxyphene n-acetaminophen, Statins, Tape, Zetia [ezetimibe], Flomax [tamsulosin hcl], Levaquin [levofloxacin hemihydrate], and Penicillins   Social History   Tobacco Use  . Smoking status: Former Smoker    Years: 50.00    Quit date: 05/12/1999    Years since quitting: 20.3  . Smokeless tobacco: Never Used  Substance Use Topics  .  Alcohol use: No  . Drug use: No     Family Hx: The patient's family history includes Arrhythmia (age of onset: 73) in his mother. There is no history of Colon cancer, Liver disease, Inflammatory bowel disease, or Colon polyps.  ROS:   Please see the history of present illness.     All other systems reviewed and are negative.   Prior CV studies:   The following studies were reviewed today:  I reviewed his lower extremity arterial Doppler studies performed 08/31/2019  Labs/Other Tests and Data Reviewed:    EKG:  No ECG reviewed.  Recent Labs: 05/04/2019: TSH 0.65 08/17/2019: ALT 19; BUN 16; Creat 1.29; Hemoglobin 12.2; Platelets 161; Potassium 4.3; Sodium 139   Recent Lipid Panel Lab Results  Component Value Date/Time   CHOL 152 08/17/2019 10:26 AM   CHOL 117 03/22/2019 08:34 AM   TRIG 358 (H) 08/17/2019 10:26 AM   HDL 32 (L) 08/17/2019 10:26 AM   HDL 41 03/22/2019 08:34 AM   CHOLHDL 4.8 08/17/2019 10:26 AM   LDLCALC 77 08/17/2019 10:26 AM   LDLDIRECT 66 04/13/2013 07:32 AM    Wt Readings from Last 3 Encounters:  09/06/19 206 lb (93.4 kg)  08/24/19 215 lb 6.4 oz (97.7 kg)  08/17/19 216 lb 6.4 oz (98.2 kg)     Objective:    Vital Signs:  There were no vitals taken for this visit.   VITAL SIGNS:  reviewed a complete physical exam was not performed today since this was a virtual telemedicine phone visit  ASSESSMENT & PLAN:    1. Coronary artery disease-history of bare-metal stenting of his first obtuse marginal branch by myself 08/28/2010.  He was recath 02/10/2012 revealing 95% "in-stent restenosis" and underwent cutting balloon atherectomy.  Subsequent To that by Dr. Ellyn Hack revealed recurrent in-stent restenosis with restenting using a Promus stent.  His last cath was by me revealing an occluded obtuse marginal branch stent which I could not open.  Medical therapy was recommended.  I did increase his Imdur from 30 to 60 mg a day when I saw him back in November and  since that time he has had no recurrent chest pain. 2. Essential hypertension-history of essential hypertension with blood pressure measured by the patient at home today 105/59 with a pulse of 53.  He is on lisinopril and Toprol 3. Hyperlipidemia-history of hyperlipidemia on Crestor with fasting lipid profile performed 08/17/2019 revealing total cholesterol 152, LDL 77 and triglyceride level of 358 4. Peripheral arterial disease-history of PAD status post left SFA PTA by myself 7/06 with recent Dopplers performed 08/31/2019 revealing normal ABIs bilaterally with a patent stent.  Patient denies claudication. 5. Palpitations-recent Zio patch performed 07/09/2019 that showed  no arrhythmias  COVID-19 Education: The signs and symptoms of COVID-19 were discussed with the patient and how to seek care for testing (follow up with PCP or arrange E-visit).  The importance of social distancing was discussed today.  Time:   Today, I have spent 7 minutes with the patient with telehealth technology discussing the above problems.     Medication Adjustments/Labs and Tests Ordered: Current medicines are reviewed at length with the patient today.  Concerns regarding medicines are outlined above.   Tests Ordered: No orders of the defined types were placed in this encounter.   Medication Changes: No orders of the defined types were placed in this encounter.   Follow Up:  In Person in 6 month(s)  Signed, Quay Burow, MD  09/29/2019 7:56 AM    [place]

## 2019-09-29 NOTE — Patient Instructions (Signed)
Medication Instructions:  Your physician recommends that you continue on your current medications as directed. Please refer to the Current Medication list given to you today.  If you need a refill on your cardiac medications before your next appointment, please call your pharmacy.   Lab work: NONE  Testing/Procedures: Your physician has requested that you have a lower extremity arterial exercise duplex in 1 year. During this test, exercise and ultrasound are used to evaluate arterial blood flow in the legs. Allow one hour for this exam. There are no restrictions or special instructions.   Follow-Up: At [facility], you and your health needs are our priority.  As part of our continuing mission to provide you with exceptional heart care, we have created designated Provider Care Teams.  These Care Teams include your primary Cardiologist (physician) and Advanced Practice Providers (APPs -  Physician Assistants and Nurse Practitioners) who all work together to provide you with the care you need, when you need it. You may see Quay Burow, MD or one of the following Advanced Practice Providers on your designated Care Team:    Kerin Ransom, PA-C  [place], [place]  Coletta Memos, [place]  Your physician wants you to follow-up in: 6 months with a physicians assistant and 1 year with Dr. Gwenlyn Found. You will receive a reminder letter in the mail two months in advance. If you don't receive a letter, please call our office to schedule the follow-up appointment.

## 2019-10-03 ENCOUNTER — Other Ambulatory Visit (INDEPENDENT_AMBULATORY_CARE_PROVIDER_SITE_OTHER): Payer: Self-pay | Admitting: Internal Medicine

## 2019-10-14 ENCOUNTER — Other Ambulatory Visit: Payer: Self-pay | Admitting: Cardiovascular Disease

## 2019-11-10 ENCOUNTER — Encounter: Payer: Self-pay | Admitting: Gastroenterology

## 2019-11-24 ENCOUNTER — Other Ambulatory Visit (INDEPENDENT_AMBULATORY_CARE_PROVIDER_SITE_OTHER): Payer: Self-pay | Admitting: Internal Medicine

## 2019-12-08 ENCOUNTER — Encounter (INDEPENDENT_AMBULATORY_CARE_PROVIDER_SITE_OTHER): Payer: Self-pay | Admitting: Internal Medicine

## 2019-12-08 ENCOUNTER — Ambulatory Visit (INDEPENDENT_AMBULATORY_CARE_PROVIDER_SITE_OTHER): Payer: PPO | Admitting: Internal Medicine

## 2019-12-08 ENCOUNTER — Other Ambulatory Visit: Payer: Self-pay

## 2019-12-08 VITALS — BP 130/64 | HR 70 | Temp 97.3°F | Resp 18 | Ht 72.0 in | Wt 220.0 lb

## 2019-12-08 DIAGNOSIS — Z1159 Encounter for screening for other viral diseases: Secondary | ICD-10-CM | POA: Diagnosis not present

## 2019-12-08 DIAGNOSIS — E785 Hyperlipidemia, unspecified: Secondary | ICD-10-CM | POA: Diagnosis not present

## 2019-12-08 DIAGNOSIS — E559 Vitamin D deficiency, unspecified: Secondary | ICD-10-CM

## 2019-12-08 DIAGNOSIS — E119 Type 2 diabetes mellitus without complications: Secondary | ICD-10-CM | POA: Diagnosis not present

## 2019-12-08 DIAGNOSIS — I10 Essential (primary) hypertension: Secondary | ICD-10-CM

## 2019-12-08 NOTE — Patient Instructions (Signed)
[facility] Dietary Recommendations for Weight Loss What to Avoid . Avoid added sugars o Often added sugar can be found in processed foods such as many condiments, dry cereals, cakes, cookies, chips, crisps, crackers, candies, sweetened drinks, etc.  o Read labels and AVOID/DECREASE use of foods with the following in their ingredient list: Sugar, fructose, high fructose corn syrup, sucrose, glucose, maltose, dextrose, molasses, cane sugar, brown sugar, any type of syrup, agave nectar, etc.   . Avoid snacking in between meals . Avoid foods made with flour o If you are going to eat food made with flour, choose those made with whole-grains; and, minimize your consumption as much as is tolerable . Avoid processed foods o These foods are generally stocked in the middle of the grocery store. Focus on shopping on the perimeter of the grocery.  . Avoid Meat  o We recommend following a plant-based diet at [facility]. Thus, we recommend avoiding meat as a general rule. Consider eating beans, legumes, eggs, and/or dairy products for regular protein sources o If you plan on eating meat limit to 4 ounces of meat at a time and choose lean options such as Fish, chicken, turkey. Avoid red meat intake such as pork and/or steak What to Include . Vegetables o GREEN LEAFY VEGETABLES: Kale, spinach, mustard greens, collard greens, cabbage, broccoli, etc. o OTHER: Asparagus, cauliflower, eggplant, carrots, peas, Brussel sprouts, tomatoes, bell peppers, zucchini, beets, cucumbers, etc. . Grains, seeds, and legumes o Beans: kidney beans, black eyed peas, garbanzo beans, black beans, pinto beans, etc. o Whole, unrefined grains: brown rice, barley, bulgur, oatmeal, etc. . Healthy fats  o Avoid highly processed fats such as vegetable oil o Examples of healthy fats: avocado, olives, virgin olive oil, dark chocolate (?72% Cocoa), nuts (peanuts, almonds, walnuts, cashews, pecans, etc.) . None to Low  Intake of Animal Sources of Protein o Meat sources: chicken, turkey, salmon, tuna. Limit to 4 ounces of meat at one time. o Consider limiting dairy sources, but when choosing dairy focus on: PLAIN Greek yogurt, cottage cheese, high-protein milk . Fruit o Choose berries  When to Eat . Intermittent Fasting: o Choosing not to eat for a specific time period, but DO FOCUS ON HYDRATION when fasting o Multiple Techniques: - Time Restricted Eating: eat 3 meals in a day, each meal lasting no more than 60 minutes, no snacks between meals - 16-18 hour fast: fast for 16 to 18 hours up to 7 days a week. Often suggested to start with 2-3 nonconsecutive days per week.  . Remember the time you sleep is counted as fasting.  . Examples of eating schedule: Fast from 7:00pm-11:00am. Eat between 11:00am-7:00pm.  - 24-hour fast: fast for 24 hours up to every other day. Often suggested to start with 1 day per week . Remember the time you sleep is counted as fasting . Examples of eating schedule:  o Eating day: eat 2-3 meals on your eating day. If doing 2 meals, each meal should last no more than 90 minutes. If doing 3 meals, each meal should last no more than 60 minutes. Finish last meal by 7:00pm. o Fasting day: Fast until 7:00pm.  o IF YOU FEEL UNWELL FOR ANY REASON/IN ANY WAY WHEN FASTING, STOP FASTING BY EATING A NUTRITIOUS SNACK OR LIGHT MEAL o ALWAYS FOCUS ON HYDRATION DURING FASTS - Acceptable Hydration sources: water, broths, tea/coffee (black tea/coffee is best but using a small amount of whole-fat dairy products in coffee/tea is acceptable).  -   Poor Hydration Sources: anything with sugar or artificial sweeteners added to it  These recommendations have been developed for patients that are actively receiving medical care from either Dr. Jasiah Elsen or Sarah Gray, DNP, NP-C at [facility]. These recommendations are developed for patients with specific medical conditions and are not meant to be  distributed or used by others that are not actively receiving care from either provider listed above at [facility]. It is not appropriate to participate in the above eating plans without proper medical supervision.   Reference: Fung, J. The obesity code. [place]: [place]; 2016.   

## 2019-12-08 NOTE — Progress Notes (Signed)
Metrics: Intervention Frequency ACO  Documented Smoking Status Yearly  Screened one or more times in 24 months  Cessation Counseling or  Active cessation medication Past 24 months  Past 24 months   Guideline developer: UpToDate (See UpToDate for funding source) Date Released: 2014       Wellness Office Visit  Subjective:  Patient ID: [name], male    DOB: 11-02-1945  Age: 74 y.o. MRN: [id number]  CC: This man comes in for follow-up of diabetes, hypertension, hyperlipidemia in the face of coronary artery disease and peripheral vascular disease. HPI  He is doing reasonably well but he has gained weight and he is diabetic control has not been so good.  He was taking his diabetic medications every other day but he has had to take it every day as his blood sugar levels have been rising.  He has been eating rather less than optimal lately.  He does exercise on a daily basis. He continues with statin therapy for hyperlipidemia in the face of coronary artery disease. He continues with antihypertensive therapy. He denies chest pain, dyspnea, palpitations or limb weakness. Past Medical History:  Diagnosis Date  . CHF (congestive heart failure) ([place])   . Coronary artery disease    s/p multiple caths 2012, stenting 1/19 NSTEMI with occlusion of OM, unable to wire-->Rx therapy, normal EF  . Diabetes mellitus   . GERD (gastroesophageal reflux disease)   . Hyperlipidemia   . Hypertension   . MI (myocardial infarction) ([place]) 2019  . PVD (peripheral vascular disease) ([place])    left SFA PTA & stenting in 02/2005 (Dr. Adora Fridge)  . Vitamin D deficiency disease 05/04/2019      Family History  Problem Relation Age of Onset  . Arrhythmia Mother 31  . Colon cancer Neg Hx   . Liver disease Neg Hx   . Inflammatory bowel disease Neg Hx   . 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   Social History   Tobacco Use  . Smoking status: Former Smoker    Years: 50.00    Quit date: 05/12/1999    Years since quitting: 20.5  . Smokeless tobacco: Never Used  Substance Use Topics  . Alcohol use: No    Current Meds  Medication Sig  . acetaminophen (TYLENOL) 500 MG tablet Take 1,000 mg by mouth every 6 (six) hours as needed for moderate pain.   Marland Kitchen aspirin 81 MG chewable tablet Chew 1 tablet (81 mg total) by mouth daily.  . Cholecalciferol (VITAMIN D-3) 125 MCG (5000 UT) TABS Take 5,000 Units by mouth daily.   . clopidogrel (PLAVIX) 75 MG tablet TAKE ONE TABLET BY MOUTH ONCE DAILY. (Patient taking differently: Take 75 mg by mouth daily. )  . Coenzyme Q10 (CO Q-10) 400 MG CAPS Take 400 mg by mouth daily.  . cyclobenzaprine (FLEXERIL) 10 MG tablet Take 10 mg by mouth 3 (three) times daily as needed for muscle spasms.  Marland Kitchen dextromethorphan-guaiFENesin (MUCINEX DM) 30-600 MG 12hr tablet Take 1 tablet by mouth 2 (two) times daily.   Marland Kitchen glipiZIDE-metformin (METAGLIP) 5-500 MG tablet TAKE (1) TABLET BY MOUTH ONCE DAILY.  . isosorbide mononitrate (IMDUR) 60 MG 24 hr tablet Take 1 tablet (60 mg total) by mouth daily.  Marland Kitchen lisinopril (PRINIVIL,ZESTRIL) 5 MG tablet Take 5 mg by mouth daily.  Marland Kitchen loratadine (CLARITIN) 10 MG tablet Take 10  mg by mouth daily.  . metoprolol succinate (TOPROL-XL) 25 MG 24 hr tablet TAKE 2 TABLETS BY MOUTH ONCE DAILY.  . nitroGLYCERIN (NITROSTAT) 0.4 MG SL tablet Place 1 tablet (0.4 mg total) under the tongue every 5 (five) minutes as needed. For chest pain  . ONETOUCH ULTRA test strip USE AS DIRECTED UP TO 3 TIMES DAILY IF NEEDED.  Marland Kitchen pantoprazole (PROTONIX) 40 MG tablet Take 40 mg by mouth daily.  . ranolazine (RANEXA) 500 MG 12 hr tablet Take 500 mg by mouth 2 (two) times daily.   . rosuvastatin (CRESTOR) 20 MG tablet TAKE 1 TABLET ONCE DAILY AT 6 P.M.       Objective:   Today's Vitals: BP 130/64 (BP Location: Right Arm, Patient Position: Sitting,  Cuff Size: Normal)   Pulse 70   Temp (!) 97.3 F (36.3 C) (Temporal)   Resp 18   Ht 6' (1.829 m)   Wt 220 lb (99.8 kg)   SpO2 98%   BMI 29.84 kg/m  Vitals with BMI 12/08/2019 09/29/2019 09/08/2019  Height 6\' 0"  6\' 0"  -  Weight 220 lbs 212 lbs -  BMI [id number] -  Systolic [id number]  Diastolic 64 59 63  Pulse 70 53 66     Physical Exam  He looks systemically well.  He remains overweight and is gained 8 pounds since last visit.  Blood pressure is well controlled.  He is alert and orientated without any focal neurological signs.     Assessment   1. Essential hypertension   2. Non-insulin treated type 2 diabetes mellitus ([place])   3. Vitamin D deficiency disease   4. Dyslipidemia   5. Encounter for hepatitis C screening test for low risk patient       Tests ordered Orders Placed This Encounter  Procedures  . COMPLETE METABOLIC PANEL WITH GFR  . Hemoglobin A1c  . Lipid panel  . Hepatitis C antibody     Plan: 1. Blood work is ordered. 2. He will continue with antihypertensive therapy which seems to be controlling his blood pressure. 3. We discussed his diabetes today in more detail and I recommended a plant-based diet together with intermittent fasting and have given him a diet sheet regarding this. 4. He will continue with statin therapy and we will check a lipid panel. 5. Further recommendations will depend on blood results and I will see him in 3 months time for follow-up.   No orders of the defined types were placed in this encounter.   [name], MD

## 2019-12-09 LAB — COMPLETE METABOLIC PANEL WITH GFR
AG Ratio: 1.4 (calc) (ref 1.0–2.5)
ALT: 18 U/L (ref 9–46)
AST: 21 U/L (ref 10–35)
Albumin: 3.9 g/dL (ref 3.6–5.1)
Alkaline phosphatase (APISO): 45 U/L (ref 35–144)
BUN/Creatinine Ratio: 13 (calc) (ref 6–22)
BUN: 18 mg/dL (ref 7–25)
CO2: 27 mmol/L (ref 20–32)
Calcium: 9.8 mg/dL (ref 8.6–10.3)
Chloride: 107 mmol/L (ref 98–110)
Creat: 1.35 mg/dL — ABNORMAL HIGH (ref 0.70–1.18)
GFR, Est African American: 60 mL/min/{1.73_m2} (ref >=60)
GFR, Est Non African American: 52 mL/min/{1.73_m2} — ABNORMAL LOW (ref >=60)
Globulin: 2.8 g/dL (calc) (ref 1.9–3.7)
Glucose, Bld: 158 mg/dL — ABNORMAL HIGH (ref 65–99)
Potassium: 4.5 mmol/L (ref 3.5–5.3)
Sodium: 140 mmol/L (ref 135–146)
Total Bilirubin: 0.8 mg/dL (ref 0.2–1.2)
Total Protein: 6.7 g/dL (ref 6.1–8.1)

## 2019-12-09 LAB — HEMOGLOBIN A1C
Hgb A1c MFr Bld: 7.9 % of total Hgb — ABNORMAL HIGH (ref <5.7)
Mean Plasma Glucose: 180 (calc)
eAG (mmol/L): 10 (calc)

## 2019-12-09 LAB — LIPID PANEL
Cholesterol: 131 mg/dL (ref <200)
HDL: 39 mg/dL — ABNORMAL LOW (ref >=40)
LDL Cholesterol (Calc): 69 mg/dL (calc)
Non-HDL Cholesterol (Calc): 92 mg/dL (calc) (ref <130)
Total CHOL/HDL Ratio: 3.4 (calc) (ref <5.0)
Triglycerides: 143 mg/dL (ref <150)

## 2019-12-09 LAB — HEPATITIS C ANTIBODY
Hepatitis C Ab: NONREACTIVE
SIGNAL TO CUT-OFF: 0.01 (ref <1.00)

## 2019-12-14 NOTE — Progress Notes (Signed)
Patient called.Left message to notify of results in mychart & if he has any concerns/questions to contact us back in mychart.

## 2019-12-15 ENCOUNTER — Ambulatory Visit: Payer: PPO | Admitting: Nurse Practitioner

## 2019-12-22 ENCOUNTER — Ambulatory Visit: Payer: PPO | Admitting: Nurse Practitioner

## 2020-01-05 ENCOUNTER — Other Ambulatory Visit (INDEPENDENT_AMBULATORY_CARE_PROVIDER_SITE_OTHER): Payer: Self-pay | Admitting: Internal Medicine

## 2020-01-11 ENCOUNTER — Other Ambulatory Visit: Payer: Self-pay

## 2020-01-11 ENCOUNTER — Ambulatory Visit: Payer: PPO | Admitting: Nurse Practitioner

## 2020-01-11 ENCOUNTER — Encounter: Payer: Self-pay | Admitting: Nurse Practitioner

## 2020-01-11 VITALS — BP 107/59 | HR 57 | Temp 97.1°F | Ht 72.0 in | Wt 217.2 lb

## 2020-01-11 DIAGNOSIS — K5900 Constipation, unspecified: Secondary | ICD-10-CM | POA: Diagnosis not present

## 2020-01-11 DIAGNOSIS — K219 Gastro-esophageal reflux disease without esophagitis: Secondary | ICD-10-CM

## 2020-01-11 DIAGNOSIS — D508 Other iron deficiency anemias: Secondary | ICD-10-CM

## 2020-01-11 NOTE — Assessment & Plan Note (Signed)
Constipation improved on Colace.  Having a bowel movement about every other day which is oftentimes easily.  Recommend he continue Colace.  Follow-up in 6 months.  Call for any worsening or severe symptoms.

## 2020-01-11 NOTE — Assessment & Plan Note (Signed)
GERD symptoms generally well controlled on his regimen of Dexilant and Protonix.  Typically will take Dexilant for a few months and it feels like it loses effectiveness so he switched to Protonix for a couple months until it loses effectiveness, and so on.  As it seems to be quite effective for him I recommend he continue this regimen.  Call for any worsening or severe symptoms, follow-up in 6 months otherwise.

## 2020-01-11 NOTE — Progress Notes (Signed)
Referring Provider: [name], MD Primary Care Physician:  [name], MD Primary GI:  Dr. Gala Romney (in the absence of Dr. Oneida Alar); pending Dr. Abbey Chatters  Chief Complaint  Patient presents with  . Follow-up    f/u from TCS and IDA, constipation better    HPI:   [name] is a 74 y.o. male who presents for follow-up on IDA and constipation.  The patient was last seen in our office 08/24/2019 for IDA and constipation.  History of anemia with hemoglobin approximately 12 and ferritin low at 34, iron sat is low at 13 on initial referral.  Folate and B12 unremarkable.  Previous colonoscopy in 2018 with pancolonic diverticulosis and nonbleeding internal hemorrhoids.  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  Chronic GERD on Dexilant.  EGD was updated 08/02/2019 with normal esophagus, erythematous mucosa in the stomach status post biopsy, normal duodenum.  Surgical pathology found the biopsies to be consistent with PPI effect, no H. pylori.  Recommended consider update colonoscopy at the first of the year.  At his last visit some constipation with a bowel movement every 3 to 4 days unless he takes something which he usually uses Dulcolax.  Drinks adequate water, eats adequate fiber.  No other attempted medications for constipation.  Notes that he switches between Dexilant and Protonix which manages his GERD well.  No other overt complaints.  Recommended start Colace 100 mg daily, MiraLAX once daily, or twice daily, as needed.  Plan for colonoscopy, notify us of any bleeding, consider future Givens capsule endoscopy if colonoscopy looks normal, follow-up in 6 months.  Colonoscopy was completed 09/08/2019 which found diverticulosis in the sigmoid colon and descending colon, single 5 mm polyp at ileocecal valve, noted cecal AVM ablated and clipped.  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  Today he states he is doing well overall.  Constipation is better, having a bowel movement every other day, consistent with [place] 4. Feels Colace is effective for him. Denies hematochezia, melena, abdominal pain, N/V, fever, chills, unintentional weight loss. Denies URI or flu-like symptoms. Denies loss of sense of taste or smell. The patient has received COVID-19 vaccination(s). Denies chest pain, dyspnea, dizziness, lightheadedness, syncope, near syncope. Denies any other upper or lower GI symptoms.  Takes Dexilant for a few months, then Protonix for 1-2 months, then back to [employer]. This regimen controls his GERD symptoms well.  Past Medical History:  Diagnosis Date  . CHF (congestive heart failure) ([place])   . Coronary artery disease    s/p multiple caths 2012, stenting 1/19 NSTEMI with occlusion of OM, unable to wire-->Rx therapy, normal EF  . Diabetes mellitus   . GERD (gastroesophageal reflux disease)   . Hyperlipidemia   . Hypertension   . MI (myocardial infarction) ([place]) 2019  . PVD (peripheral vascular disease) ([place])    left SFA PTA & stenting in 02/2005 (Dr. Adora Fridge)  . Vitamin D deficiency disease 05/04/2019    Past Surgical History:  Procedure Laterality Date  . BIOPSY  08/02/2019   Procedure: BIOPSY;  Surgeon: Daneil Dolin, MD;  [facility];  Service: Endoscopy;;  gastric   . CARDIAC CATHETERIZATION  12/23/2004   normal L main, normal LAD, normal L Cfx, RCA with 20% hypodense lesion in first end of vessel (Dr. Adora Fridge)  . CARDIAC CATHETERIZATION  08/26/2007  no significant CAD by cath, EF 50% (Dr. Jackie Plum)  . CARDIAC CATHETERIZATION  08/28/2010   stent to OM1 with 2.0x32mm BMS (Dr. Adora Fridge)  . CARDIAC CATHETERIZATION  09/11/2010   patent stent (Dr. Roni Bread)  . CARDIAC CATHETERIZATION  02/10/2011   95% prox in-stent restenosis within OM stent - opened with cutting balloon (Dr. Corky Downs)  . CARDIAC  CATHETERIZATION  07/17/2011   in-stent restenosis - re-stented with Promus 2.25x18mm DES (Dr. Roni Bread)  . COLONOSCOPY  12/2009   Dr. Hampton Abbot  . COLONOSCOPY N/A 01/30/2017   pancolonic diverticulosis, non-bleeding internal hemorrhoids.  . COLONOSCOPY WITH PROPOFOL N/A 09/08/2019   Procedure: COLONOSCOPY WITH PROPOFOL;  Surgeon: Daneil Dolin, MD;  [facility];  Service: Endoscopy;  Laterality: N/A;  11:15am  . CORONARY BALLOON ANGIOPLASTY N/A 08/30/2017   Procedure: CORONARY BALLOON ANGIOPLASTY;  Surgeon: [name], MD;  Location: [place] [facility];  Service: Cardiovascular;  Laterality: N/A;  . 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 ESOPHAGOGASTRODUODENOSCOPY N/A 08/02/2019   Procedure: ESOPHAGOGASTRODUODENOSCOPY (EGD);  Surgeon: Daneil Dolin, MD;  [facility];  Service: Endoscopy;  Laterality: N/A;  8:45am  . FEMORAL ARTERY STENT  02/27/2005   L SFA stenting - Wholey down SFA across lesion - predilatation with 4x4 Powerflex, stenting with 7x4 Smart, post-dilatation with 6x4 powerflex (Dr. Adora Fridge)  . LEFT HEART CATH AND CORONARY ANGIOGRAPHY N/A 08/30/2017   Procedure: LEFT HEART CATH AND CORONARY ANGIOGRAPHY;  Surgeon: [name], MD;  Location: [place] [facility];  Service: Cardiovascular;  Laterality: N/A;  . LEFT HEART CATHETERIZATION WITH CORONARY ANGIOGRAM N/A 07/17/2011   Procedure: LEFT HEART CATHETERIZATION WITH CORONARY ANGIOGRAM;  Surgeon: Leonie Man, MD;  Location: [facility] CATH LAB;  Service: Cardiovascular;  Laterality: N/A;  Right radial approach  . left knee arthroscopy  05/2016  . NM MYOCAR PERF WALL MOTION  09/08/2013   abnormal lexiscan - low to intermediate risk;   . POLYPECTOMY  09/08/2019   Procedure: POLYPECTOMY;  Surgeon: Daneil Dolin, MD;  [facility];  Service: Endoscopy;;  .  TRANSTHORACIC ECHOCARDIOGRAM  09/08/2013   EF 50-55%, mild LVH, grade 1 diastolic dysfunction, mildly calcified AV annulus, calcified MV, LA mildly dilated,     Current Outpatient Medications  Medication Sig Dispense Refill  . acetaminophen (TYLENOL) 500 MG tablet Take 1,000 mg by mouth as needed for moderate pain.     Marland Kitchen aspirin 81 MG chewable tablet Chew 1 tablet (81 mg total) by mouth daily.    . Cholecalciferol (VITAMIN D-3) 125 MCG (5000 UT) TABS Take 5,000 Units by mouth daily.     . clopidogrel (PLAVIX) 75 MG tablet TAKE ONE TABLET BY MOUTH ONCE DAILY. (Patient taking differently: Take 75 mg by mouth daily. ) 30 tablet 9  . Coenzyme Q10 (CO Q-10) 400 MG CAPS Take 400 mg by mouth daily.    . cyclobenzaprine (FLEXERIL) 10 MG tablet Take 10 mg by mouth 3 (three) times daily as needed for muscle spasms.    Marland Kitchen dexlansoprazole (DEXILANT) 60 MG capsule Take 60 mg by mouth daily.    Marland Kitchen dextromethorphan-guaiFENesin (MUCINEX DM) 30-600 MG 12hr tablet Take 1 tablet by mouth as needed.     . fexofenadine (ALLEGRA) 180 MG tablet Take 180 mg by mouth as needed for allergies or rhinitis. Rotates with Claritin.    Marland Kitchen glipiZIDE-metformin (METAGLIP) 5-500  MG tablet TAKE (1) TABLET BY MOUTH ONCE DAILY. 90 tablet 0  . isosorbide mononitrate (IMDUR) 60 MG 24 hr tablet Take 1 tablet (60 mg total) by mouth daily. 90 tablet 3  . lisinopril (PRINIVIL,ZESTRIL) 5 MG tablet Take 5 mg by mouth daily.    Marland Kitchen loratadine (CLARITIN) 10 MG tablet Take 10 mg by mouth as needed. Rotates with Allegra.    . metoprolol succinate (TOPROL-XL) 25 MG 24 hr tablet TAKE 2 TABLETS BY MOUTH ONCE DAILY. 180 tablet 2  . nitroGLYCERIN (NITROSTAT) 0.4 MG SL tablet Place 1 tablet (0.4 mg total) under the tongue every 5 (five) minutes as needed. For chest pain 25 tablet 3  . ONETOUCH ULTRA test strip USE AS DIRECTED UP TO 3 TIMES DAILY IF NEEDED. 100 strip 0  . pantoprazole (PROTONIX) 40 MG tablet Take 40 mg by mouth as needed.     . ranolazine  (RANEXA) 500 MG 12 hr tablet Take 500 mg by mouth 2 (two) times daily.     . rosuvastatin (CRESTOR) 20 MG tablet TAKE 1 TABLET ONCE DAILY AT 6 P.M. 90 tablet 3   No current facility-administered medications for this visit.    Allergies as of 01/11/2020 - Review Complete 01/11/2020  Allergen Reaction Noted  . Propoxyphene n-acetaminophen Nausea Only   . Statins Other (See Comments) 07/29/2011  . Tape  07/29/2011  . Zetia [ezetimibe]  08/30/2017  . Flomax [tamsulosin hcl] Rash 08/16/2014  . Levaquin [levofloxacin hemihydrate] Rash 07/29/2011  . Penicillins Rash     Family History  Problem Relation Age of Onset  . Arrhythmia Mother 47  . Colon cancer Neg Hx   . Liver disease Neg Hx   . Inflammatory bowel disease Neg Hx   . Colon polyps Neg Hx     Social History   Socioeconomic History  . Marital status: Married    Spouse name: Not on file  . Number of children: 3  . Years of education: Not on file  . Highest education level: Not on file  Occupational History  . Occupation: Retired    Fish farm manager: [employer]  Tobacco Use  . Smoking status: Former Smoker    Years: 50.00    Quit date: 05/12/1999    Years since quitting: 20.6  . Smokeless tobacco: Never Used  Substance and Sexual Activity  . Alcohol use: No  . Drug use: No  . Sexual activity: Never  Other Topics Concern  . 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   Social Determinants of Health   Financial Resource Strain:   . Difficulty of Paying Living Expenses:   Food Insecurity:   . Worried About Charity fundraiser in the Last Year:   . Arboriculturist in the Last Year:   Transportation Needs:   . Film/video editor (Medical):   Marland Kitchen Lack of Transportation (Non-Medical):   Physical Activity:   . Days of Exercise per Week:   . Minutes of Exercise per Session:   Stress:   . Feeling of Stress :   Social  Connections:   . Frequency of Communication with Friends and Family:   . Frequency of Social Gatherings with Friends and Family:   . Attends Religious Services:   . Active Member of Clubs or Organizations:   . Attends Archivist Meetings:   Marland Kitchen Marital Status:     Subjective: Review of Systems  Constitutional:  Negative for chills, fever, malaise/fatigue and weight loss.  HENT: Negative for congestion and sore throat.   Respiratory: Negative for cough and shortness of breath.   Cardiovascular: Negative for chest pain and palpitations.  Gastrointestinal: Negative for abdominal pain, blood in stool, diarrhea, melena, nausea and vomiting.  Musculoskeletal: Negative for joint pain and myalgias.  Skin: Negative for rash.  Neurological: Negative for dizziness and weakness.  Endo/Heme/Allergies: Does not bruise/bleed easily.  Psychiatric/Behavioral: Negative for depression. The patient is not nervous/anxious.   All other systems reviewed and are negative.    Objective: BP (!) 107/59   Pulse (!) 57   Temp (!) 97.1 F (36.2 C) (Temporal)   Ht 6' (1.829 m)   Wt 217 lb 3.2 oz (98.5 kg)   BMI 29.46 kg/m  Physical Exam Vitals and nursing note reviewed.  Constitutional:      General: He is not in acute distress.    Appearance: Normal appearance. He is not ill-appearing, toxic-appearing or diaphoretic.  HENT:     Head: Normocephalic and atraumatic.     Nose: No congestion or rhinorrhea.  Eyes:     General: No scleral icterus. Cardiovascular:     Rate and Rhythm: Normal rate and regular rhythm.     Heart sounds: Normal heart sounds.  Pulmonary:     Effort: Pulmonary effort is normal.     Breath sounds: Normal breath sounds.  Abdominal:     General: Bowel sounds are normal. There is no distension.     Palpations: Abdomen is soft. There is no hepatomegaly, splenomegaly or mass.     Tenderness: There is no abdominal tenderness. There is no guarding or rebound.     Hernia: No  hernia is present.  Musculoskeletal:     Cervical back: Neck supple.  Skin:    General: Skin is warm and dry.     Coloration: Skin is not jaundiced.     Findings: No bruising or rash.  Neurological:     General: No focal deficit present.     Mental Status: He is alert and oriented to person, place, and time. Mental status is at baseline.  Psychiatric:        Mood and Affect: Mood normal.        Behavior: Behavior normal.        Thought Content: Thought content normal.       01/11/2020 11:27 AM   Disclaimer: This note was dictated with voice recognition software. Similar sounding words can inadvertently be transcribed and may not be corrected upon review.

## 2020-01-11 NOTE — Patient Instructions (Signed)
Your health issues we discussed today were:   Constipation: 1. I am glad you are doing better with this! 2. Continue to take Colace as you have been 3. Let us know if you have any worsening or severe symptoms  GERD (reflux/heartburn): 1. I am glad you are doing better with this as well 2. Continue with your regimen of alternating Dexilant and Protonix 3. Let us know if you have any worsening or severe symptoms  Iron deficiency anemia: 1. Have your blood cell count and long-term iron stores checked when you are able to 2. We will call you when we get the results 3. Call us if you have any obvious bleeding, worsening fatigue, lightheadedness, shortness of breath, etc.  Overall I recommend:  1. Continue your other current medications 2. Return for follow-up in 6 months 3. Call us if you have any questions or concerns   ---------------------------------------------------------------  I am glad you have gotten your COVID-19 vaccination!  Even though you are fully vaccinated you should continue to follow CDC and state/local guidelines.  ---------------------------------------------------------------   At [facility] we value your feedback. You may receive a survey about your visit today. Please share your experience as we strive to create trusting relationships with our patients to provide genuine, compassionate, quality care.  We appreciate your understanding and patience as we review any laboratory studies, imaging, and other diagnostic tests that are ordered as we care for you. Our office policy is 5 business days for review of these results, and any emergent or urgent results are addressed in a timely manner for your best interest. If you do not hear from our office in 1 week, please contact us.   We also encourage the use of MyChart, which contains your medical information for your review as well. If you are not enrolled in this feature, an access code is on this  after visit summary for your convenience. Thank you for allowing [place] to be involved in your care.  It was great to see you today!  I hope you have a great Summer!!

## 2020-01-11 NOTE — Assessment & Plan Note (Signed)
Iron deficiency anemia as noted in HPI with hemoglobin 12, ferritin low at 34, iron saturation is low at 13.  He underwent colonoscopy which found diverticulosis, a single polyp, and a cecal AVM.  A polyp was removed and found to be tubular adenoma.  The AVM was ablated and clipped.  Given chronic anticoagulation, AVM most definitely a contributing source toward his IDA.  He has not had labs since his colonoscopy so I will check a CBC and ferritin for the status of his IDA.  Further recommendations to follow.  Follow-up in 6 months.  Call for any obvious or worsening bleeding.

## 2020-01-12 DIAGNOSIS — K5900 Constipation, unspecified: Secondary | ICD-10-CM | POA: Diagnosis not present

## 2020-01-12 DIAGNOSIS — K219 Gastro-esophageal reflux disease without esophagitis: Secondary | ICD-10-CM | POA: Diagnosis not present

## 2020-01-12 DIAGNOSIS — D508 Other iron deficiency anemias: Secondary | ICD-10-CM | POA: Diagnosis not present

## 2020-01-12 LAB — CBC WITH DIFFERENTIAL/PLATELET
Absolute Monocytes: 347 cells/uL (ref 200–950)
Basophils Absolute: 41 cells/uL (ref 0–200)
Basophils Relative: 0.8 %
Eosinophils Absolute: 61 cells/uL (ref 15–500)
Eosinophils Relative: 1.2 %
HCT: 37.2 % — ABNORMAL LOW (ref 38.5–50.0)
Hemoglobin: 12.3 g/dL — ABNORMAL LOW (ref 13.2–17.1)
Lymphs Abs: 2111 cells/uL (ref 850–3900)
MCH: 31.6 pg (ref 27.0–33.0)
MCHC: 33.1 g/dL (ref 32.0–36.0)
MCV: 95.6 fL (ref 80.0–100.0)
MPV: 11.3 fL (ref 7.5–12.5)
Monocytes Relative: 6.8 %
Neutro Abs: 2540 cells/uL (ref 1500–7800)
Neutrophils Relative %: 49.8 %
Platelets: 164 10*3/uL (ref 140–400)
RBC: 3.89 10*6/uL — ABNORMAL LOW (ref 4.20–5.80)
RDW: 14.2 % (ref 11.0–15.0)
Total Lymphocyte: 41.4 %
WBC: 5.1 10*3/uL (ref 3.8–10.8)

## 2020-01-12 LAB — FERRITIN: Ferritin: 28 ng/mL (ref 24–380)

## 2020-01-12 NOTE — Progress Notes (Signed)
Cc'ed to pcp °

## 2020-02-20 ENCOUNTER — Other Ambulatory Visit (INDEPENDENT_AMBULATORY_CARE_PROVIDER_SITE_OTHER): Payer: Self-pay | Admitting: Nurse Practitioner

## 2020-02-22 ENCOUNTER — Ambulatory Visit: Payer: PPO | Admitting: Nurse Practitioner

## 2020-03-20 ENCOUNTER — Ambulatory Visit (INDEPENDENT_AMBULATORY_CARE_PROVIDER_SITE_OTHER): Payer: PPO | Admitting: Internal Medicine

## 2020-03-20 ENCOUNTER — Other Ambulatory Visit: Payer: Self-pay

## 2020-03-20 ENCOUNTER — Encounter (INDEPENDENT_AMBULATORY_CARE_PROVIDER_SITE_OTHER): Payer: Self-pay | Admitting: Internal Medicine

## 2020-03-20 VITALS — BP 105/60 | HR 69 | Temp 97.3°F | Ht 72.0 in | Wt 217.0 lb

## 2020-03-20 DIAGNOSIS — I10 Essential (primary) hypertension: Secondary | ICD-10-CM | POA: Diagnosis not present

## 2020-03-20 DIAGNOSIS — E119 Type 2 diabetes mellitus without complications: Secondary | ICD-10-CM | POA: Diagnosis not present

## 2020-03-20 DIAGNOSIS — E785 Hyperlipidemia, unspecified: Secondary | ICD-10-CM | POA: Diagnosis not present

## 2020-03-20 DIAGNOSIS — M791 Myalgia, unspecified site: Secondary | ICD-10-CM | POA: Diagnosis not present

## 2020-03-20 NOTE — Progress Notes (Signed)
Metrics: Intervention Frequency ACO  Documented Smoking Status Yearly  Screened one or more times in 24 months  Cessation Counseling or  Active cessation medication Past 24 months  Past 24 months   Guideline developer: UpToDate (See UpToDate for funding source) Date Released: 2014       Wellness Office Visit  Subjective:  Patient ID: [name], male    DOB: 15-Aug-1945  Age: 74 y.o. MRN: [id number]  CC: This man comes in for follow-up of diabetes, hypertension, hyperlipidemia in the face of coronary artery disease. HPI He is taking Crestor for his hyperlipidemia in the face of coronary artery disease and he describes myalgia.  He does take co-Q10 and most of the time this helps him but lately has had myalgia. As far as his diabetes is concerned, he has been trying to do better with his diet and is staying away from animal protein more so than he used to.  He continues on glipizide and Metformin for his diabetes. He continues on lisinopril for his hypertension and beta-blockers for hypertension and cardioprotection. He denies any chest pain, dyspnea, palpitations or limb weakness. He tries to keep active on a daily basis  Past Medical History:  Diagnosis Date  . CHF (congestive heart failure) ([place])   . Coronary artery disease    s/p multiple caths 2012, stenting 1/19 NSTEMI with occlusion of OM, unable to wire-->Rx therapy, normal EF  . Diabetes mellitus   . GERD (gastroesophageal reflux disease)   . Hyperlipidemia   . Hypertension   . MI (myocardial infarction) ([place]) 2019  . PVD (peripheral vascular disease) ([place])    left SFA PTA & stenting in 02/2005 (Dr. Adora Fridge)  . Vitamin D deficiency disease 05/04/2019   Past Surgical History:  Procedure Laterality Date  . BIOPSY  08/02/2019   Procedure: BIOPSY;  Surgeon: Daneil Dolin, MD;  [facility];  Service: Endoscopy;;  gastric   . CARDIAC CATHETERIZATION  12/23/2004   normal L main, normal LAD, normal L Cfx, RCA  with 20% hypodense lesion in first end of vessel (Dr. Adora Fridge)  . CARDIAC CATHETERIZATION  08/26/2007   no significant CAD by cath, EF 50% (Dr. Jackie Plum)  . CARDIAC CATHETERIZATION  08/28/2010   stent to OM1 with 2.0x42mm BMS (Dr. Adora Fridge)  . CARDIAC CATHETERIZATION  09/11/2010   patent stent (Dr. Roni Bread)  . CARDIAC CATHETERIZATION  02/10/2011   95% prox in-stent restenosis within OM stent - opened with cutting balloon (Dr. Corky Downs)  . CARDIAC CATHETERIZATION  07/17/2011   in-stent restenosis - re-stented with Promus 2.25x49mm DES (Dr. Roni Bread)  . COLONOSCOPY  12/2009   Dr. Hampton Abbot  . COLONOSCOPY N/A 01/30/2017   pancolonic diverticulosis, non-bleeding internal hemorrhoids.  . COLONOSCOPY WITH PROPOFOL N/A 09/08/2019   Procedure: COLONOSCOPY WITH PROPOFOL;  Surgeon: Daneil Dolin, MD;  [facility];  Service: Endoscopy;  Laterality: N/A;  11:15am  . CORONARY BALLOON ANGIOPLASTY N/A 08/30/2017   Procedure: CORONARY BALLOON ANGIOPLASTY;  Surgeon: [name], MD;  Location: [place] [facility];  Service: Cardiovascular;  Laterality: N/A;  . 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 ESOPHAGOGASTRODUODENOSCOPY N/A 08/02/2019   Procedure: ESOPHAGOGASTRODUODENOSCOPY (EGD);  Surgeon: Daneil Dolin, MD;  [facility];  Service: Endoscopy;  Laterality: N/A;  8:45am  . FEMORAL ARTERY STENT  02/27/2005   L SFA stenting - Wholey down  SFA across lesion - predilatation with 4x4 Powerflex, stenting with 7x4 Smart, post-dilatation with 6x4 powerflex (Dr. Adora Fridge)  . LEFT HEART CATH AND CORONARY ANGIOGRAPHY N/A 08/30/2017   Procedure: LEFT HEART CATH AND CORONARY ANGIOGRAPHY;  Surgeon: [name], MD;  Location: [place] [facility];  Service: Cardiovascular;  Laterality: N/A;  . LEFT HEART CATHETERIZATION WITH CORONARY ANGIOGRAM N/A 07/17/2011    Procedure: LEFT HEART CATHETERIZATION WITH CORONARY ANGIOGRAM;  Surgeon: Leonie Man, MD;  Location: [facility] C[facility];  Service: Cardiovascular;  Laterality: N/A;  Right radial approach  . left knee arthroscopy  05/2016  . NM MYOCAR PERF WALL MOTION  09/08/2013   abnormal lexiscan - low to intermediate risk;   . POLYPECTOMY  09/08/2019   Procedure: POLYPECTOMY;  Surgeon: Daneil Dolin, MD;  [facility];  Service: Endoscopy;;  . TRANSTHORACIC ECHOCARDIOGRAM  09/08/2013   EF 50-55%, mild LVH, grade 1 diastolic dysfunction, mildly calcified AV annulus, calcified MV, LA mildly dilated,      Family History  Problem Relation Age of Onset  . Arrhythmia Mother 40  . Colon cancer Neg Hx   . Liver disease Neg Hx   . Inflammatory bowel disease Neg Hx   . 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   Social History   Tobacco Use  . Smoking status: Former Smoker    Years: 50.00    Quit date: 05/12/1999    Years since quitting: 20.8  . Smokeless tobacco: Never Used  Substance Use Topics  . Alcohol use: No    Current Meds  Medication Sig  . acetaminophen (TYLENOL) 500 MG tablet Take 1,000 mg by mouth as needed for moderate pain.   Marland Kitchen aspirin 81 MG chewable tablet Chew 1 tablet (81 mg total) by mouth daily.  . Cholecalciferol (VITAMIN D-3) 125 MCG (5000 UT) TABS Take 5,000 Units by mouth daily.   . clopidogrel (PLAVIX) 75 MG tablet TAKE ONE TABLET BY MOUTH ONCE DAILY. (Patient taking differently: Take 75 mg by mouth daily. )  . Coenzyme Q10 (CO Q-10) 400 MG CAPS Take 400 mg by mouth daily.  . cyclobenzaprine (FLEXERIL) 10 MG tablet Take 10 mg by mouth 3 (three) times daily as needed for muscle spasms.  Marland Kitchen dexlansoprazole (DEXILANT) 60 MG capsule Take 60 mg by mouth daily.  Marland Kitchen dextromethorphan-guaiFENesin (MUCINEX DM) 30-600 MG 12hr tablet Take 1 tablet by mouth  as needed.   . fexofenadine (ALLEGRA) 180 MG tablet Take 180 mg by mouth as needed for allergies or rhinitis. Rotates with Claritin.  Marland Kitchen glipiZIDE-metformin (METAGLIP) 5-500 MG tablet TAKE (1) TABLET BY MOUTH ONCE DAILY.  . isosorbide mononitrate (IMDUR) 60 MG 24 hr tablet Take 1 tablet (60 mg total) by mouth daily.  Marland Kitchen lisinopril (PRINIVIL,ZESTRIL) 5 MG tablet Take 5 mg by mouth daily.  Marland Kitchen loratadine (CLARITIN) 10 MG tablet Take 10 mg by mouth as needed. Rotates with Allegra.  . metoprolol succinate (TOPROL-XL) 25 MG 24 hr tablet TAKE 2 TABLETS BY MOUTH ONCE DAILY.  . nitroGLYCERIN (NITROSTAT) 0.4 MG SL tablet Place 1 tablet (0.4 mg total) under the tongue every 5 (five) minutes as needed. For chest pain  . ONETOUCH ULTRA test strip USE AS DIRECTED UP TO 3 TIMES DAILY IF NEEDED.  Marland Kitchen pantoprazole (PROTONIX) 40 MG tablet Take 40 mg by mouth as needed.   . ranolazine (RANEXA) 500  MG 12 hr tablet Take 500 mg by mouth 2 (two) times daily.   . rosuvastatin (CRESTOR) 20 MG tablet TAKE 1 TABLET ONCE DAILY AT 6 P.M.       Depression screen PHQ 2/9 08/17/2019  Decreased Interest 0  Down, Depressed, Hopeless 0  PHQ - 2 Score 0     Objective:   Today's Vitals: BP 105/60 (BP Location: Left Arm, Patient Position: Sitting, Cuff Size: Normal)   Pulse 69   Temp (!) 97.3 F (36.3 C) (Temporal)   Ht 6' (1.829 m)   Wt 217 lb (98.4 kg)   SpO2 94%   BMI 29.43 kg/m  Vitals with BMI 03/20/2020 01/11/2020 12/08/2019  Height 6\' 0"  6\' 0"  6\' 0"   Weight 217 lbs 217 lbs 3 oz 220 lbs  BMI 29.42 16.57 90.38  Systolic 333 832 919  Diastolic 60 59 64  Pulse 69 57 70     Physical Exam  He looks systemically well.  His weight is very stable.  Blood pressure is excellent.  He is alert and orientated without any focal neurologic signs.     Assessment   1. Essential hypertension   2. Non-insulin treated type 2 diabetes mellitus ([place])   3. Dyslipidemia   4. Myalgia       Tests ordered Orders Placed This  Encounter  Procedures  . COMPLETE METABOLIC PANEL WITH GFR  . Hemoglobin A1c  . CK     Plan: 1. He will continue with lisinopril and metoprolol for his hypertension and this is keeping his blood pressure under good control. 2. He will continue with glipizide and Metformin although it would be nice to reduce these medications and I think this can be achieved with a plant-based diet that he is going towards now. 3. We will have him continue with the Crestor but I will check a CPK today to see if he indeed does have some myositis from his Crestor. 4. Further commendations will depend on blood results and I will see him in about 3 months for an annual physical exam.   No orders of the defined types were placed in this encounter.   [name], MD

## 2020-03-21 LAB — COMPLETE METABOLIC PANEL WITH GFR
AG Ratio: 1.3 (calc) (ref 1.0–2.5)
ALT: 13 U/L (ref 9–46)
AST: 19 U/L (ref 10–35)
Albumin: 3.9 g/dL (ref 3.6–5.1)
Alkaline phosphatase (APISO): 47 U/L (ref 35–144)
BUN/Creatinine Ratio: 8 (calc) (ref 6–22)
BUN: 12 mg/dL (ref 7–25)
CO2: 27 mmol/L (ref 20–32)
Calcium: 10.1 mg/dL (ref 8.6–10.3)
Chloride: 106 mmol/L (ref 98–110)
Creat: 1.46 mg/dL — ABNORMAL HIGH (ref 0.70–1.18)
GFR, Est African American: 55 mL/min/{1.73_m2} — ABNORMAL LOW (ref >=60)
GFR, Est Non African American: 47 mL/min/{1.73_m2} — ABNORMAL LOW (ref >=60)
Globulin: 3.1 g/dL (calc) (ref 1.9–3.7)
Glucose, Bld: 156 mg/dL — ABNORMAL HIGH (ref 65–99)
Potassium: 4.8 mmol/L (ref 3.5–5.3)
Sodium: 139 mmol/L (ref 135–146)
Total Bilirubin: 0.9 mg/dL (ref 0.2–1.2)
Total Protein: 7 g/dL (ref 6.1–8.1)

## 2020-03-21 LAB — HEMOGLOBIN A1C
Hgb A1c MFr Bld: 7.4 % of total Hgb — ABNORMAL HIGH (ref <5.7)
Mean Plasma Glucose: 166 (calc)
eAG (mmol/L): 9.2 (calc)

## 2020-03-21 LAB — CK: Total CK: 616 U/L — ABNORMAL HIGH (ref 44–196)

## 2020-03-21 NOTE — Progress Notes (Signed)
Pt was called and given instructions on the lab results based on the inflaming in muscles. Pt was told to contact his Cardiologist to change cholesterol medication to something different. Also given the results of A1c was his concerns as well. So he said he will get to working on this as well.

## 2020-03-21 NOTE — Progress Notes (Signed)
Please call this patient and tell him that the cholesterol medicine is inflaming his muscles based on the muscle enzyme test, CPK.  He will need to discuss this with his cardiologist as to whether he should try different cholesterol medicine.  Please tell him to contact his cardiologist.Follow-up as scheduled.

## 2020-03-23 ENCOUNTER — Ambulatory Visit: Payer: PPO | Admitting: Cardiology

## 2020-03-23 ENCOUNTER — Other Ambulatory Visit: Payer: Self-pay

## 2020-03-23 ENCOUNTER — Encounter: Payer: Self-pay | Admitting: Cardiology

## 2020-03-23 VITALS — BP 96/60 | HR 57 | Ht 72.0 in | Wt 216.4 lb

## 2020-03-23 DIAGNOSIS — I214 Non-ST elevation (NSTEMI) myocardial infarction: Secondary | ICD-10-CM

## 2020-03-23 DIAGNOSIS — Z9861 Coronary angioplasty status: Secondary | ICD-10-CM | POA: Diagnosis not present

## 2020-03-23 DIAGNOSIS — I739 Peripheral vascular disease, unspecified: Secondary | ICD-10-CM

## 2020-03-23 DIAGNOSIS — I10 Essential (primary) hypertension: Secondary | ICD-10-CM | POA: Diagnosis not present

## 2020-03-23 DIAGNOSIS — M60859 Other myositis, unspecified thigh: Secondary | ICD-10-CM

## 2020-03-23 DIAGNOSIS — M609 Myositis, unspecified: Secondary | ICD-10-CM | POA: Insufficient documentation

## 2020-03-23 DIAGNOSIS — E785 Hyperlipidemia, unspecified: Secondary | ICD-10-CM | POA: Diagnosis not present

## 2020-03-23 DIAGNOSIS — E119 Type 2 diabetes mellitus without complications: Secondary | ICD-10-CM

## 2020-03-23 DIAGNOSIS — I2510 Atherosclerotic heart disease of native coronary artery without angina pectoris: Secondary | ICD-10-CM

## 2020-03-23 NOTE — Progress Notes (Signed)
Cardiology Office Note:    Date:  03/23/2020   ID:  [name], DOB 1946/02/20, MRN [id number]  PCP:  [name], MD  Cardiologist:  Quay Burow, MD  Electrophysiologist:  None   Referring MD: [name], MD   CC:  Thigh pain  History of Present Illness:    [name] is a pleasant 74 y.o. male with a hx of CAD and has had previous OM-1 PCI in 2012 (three different times). Cath done 08/30/17 revealed ISR of the OM1 with no other significant CAD.His EF was 50-55%.He underwentanunsuccessfulattempt at PCI then by Dr Gwenlyn Found and the plan is for medical Rx.  It took some adjustments in his medications over the last year but he has had complete resolution of his symptoms.  He walks 2 to 3 miles 5 days a week without chest pain. And plays golf regularly.  He is in the office today after his PCP picked up a history of new bilateral thigh pain.  The patient says this is new over the past week or two.  He has been on Crestor for a year.  A CK was drawn and was elevated-  616.  He has stopped the Crestor, off it two days.  No change in thigh discomfort yet.  Past Medical History:  Diagnosis Date  . CHF (congestive heart failure) ([place])   . Coronary artery disease    s/p multiple caths 2012, stenting 1/19 NSTEMI with occlusion of OM, unable to wire-->Rx therapy, normal EF  . Diabetes mellitus   . GERD (gastroesophageal reflux disease)   . Hyperlipidemia   . Hypertension   . MI (myocardial infarction) ([place]) 2019  . PVD (peripheral vascular disease) ([place])    left SFA PTA & stenting in 02/2005 (Dr. Adora Fridge)  . Vitamin D deficiency disease 05/04/2019    Past Surgical History:  Procedure Laterality Date  . BIOPSY  08/02/2019   Procedure: BIOPSY;  Surgeon: Daneil Dolin, MD;  [facility];  Service: Endoscopy;;  gastric   . CARDIAC CATHETERIZATION  12/23/2004   normal L main, normal LAD, normal L Cfx, RCA with 20% hypodense lesion in first end of vessel  (Dr. Adora Fridge)  . CARDIAC CATHETERIZATION  08/26/2007   no significant CAD by cath, EF 50% (Dr. Jackie Plum)  . CARDIAC CATHETERIZATION  08/28/2010   stent to OM1 with 2.0x69mm BMS (Dr. Adora Fridge)  . CARDIAC CATHETERIZATION  09/11/2010   patent stent (Dr. Roni Bread)  . CARDIAC CATHETERIZATION  02/10/2011   95% prox in-stent restenosis within OM stent - opened with cutting balloon (Dr. Corky Downs)  . CARDIAC CATHETERIZATION  07/17/2011   in-stent restenosis - re-stented with Promus 2.25x39mm DES (Dr. Roni Bread)  . COLONOSCOPY  12/2009   Dr. Hampton Abbot  . COLONOSCOPY N/A 01/30/2017   pancolonic diverticulosis, non-bleeding internal hemorrhoids.  . COLONOSCOPY WITH PROPOFOL N/A 09/08/2019   Procedure: COLONOSCOPY WITH PROPOFOL;  Surgeon: Daneil Dolin, MD;  [facility];  Service: Endoscopy;  Laterality: N/A;  11:15am  . CORONARY BALLOON ANGIOPLASTY N/A 08/30/2017   Procedure: CORONARY BALLOON ANGIOPLASTY;  Surgeon: [name], MD;  Location: [facility];  Service: Cardiovascular;  Laterality: N/A;  . 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 ESOPHAGOGASTRODUODENOSCOPY N/A 08/02/2019   Procedure: ESOPHAGOGASTRODUODENOSCOPY (EGD);  Surgeon: Daneil Dolin, MD;  [facility];  Service: Endoscopy;  Laterality: N/A;  8:45am  . FEMORAL ARTERY STENT  02/27/2005   L SFA stenting - [place] down SFA across lesion - predilatation with 4x4 Powerflex, stenting with 7x4 Smart, post-dilatation with 6x4 powerflex (Dr. Adora Fridge)  . LEFT HEART CATH AND CORONARY ANGIOGRAPHY N/A 08/30/2017   Procedure: LEFT HEART CATH AND CORONARY ANGIOGRAPHY;  Surgeon: [name], MD;  Location: [facility];  Service: Cardiovascular;  Laterality: N/A;  . LEFT HEART CATHETERIZATION WITH CORONARY ANGIOGRAM N/A 07/17/2011   Procedure: LEFT HEART CATHETERIZATION WITH CORONARY  ANGIOGRAM;  Surgeon: Leonie Man, MD;  Location: [facility];  Service: Cardiovascular;  Laterality: N/A;  Right radial approach  . left knee arthroscopy  05/2016  . NM MYOCAR PERF WALL MOTION  09/08/2013   abnormal lexiscan - low to intermediate risk;   . POLYPECTOMY  09/08/2019   Procedure: POLYPECTOMY;  Surgeon: Daneil Dolin, MD;  [facility];  Service: Endoscopy;;  . TRANSTHORACIC ECHOCARDIOGRAM  09/08/2013   EF 50-55%, mild LVH, grade 1 diastolic dysfunction, mildly calcified AV annulus, calcified MV, LA mildly dilated,     Current Medications: Current Meds  Medication Sig  . acetaminophen (TYLENOL) 500 MG tablet Take 1,000 mg by mouth as needed for moderate pain.   Marland Kitchen aspirin 81 MG chewable tablet Chew 1 tablet (81 mg total) by mouth daily.  . Cholecalciferol (VITAMIN D-3) 125 MCG (5000 UT) TABS Take 5,000 Units by mouth daily.   . clopidogrel (PLAVIX) 75 MG tablet TAKE ONE TABLET BY MOUTH ONCE DAILY. (Patient taking differently: Take 75 mg by mouth daily. )  . Coenzyme Q10 (CO Q-10) 400 MG CAPS Take 400 mg by mouth daily.  . cyclobenzaprine (FLEXERIL) 10 MG tablet Take 10 mg by mouth 3 (three) times daily as needed for muscle spasms.  Marland Kitchen dexlansoprazole (DEXILANT) 60 MG capsule Take 60 mg by mouth daily.  Marland Kitchen dextromethorphan-guaiFENesin (MUCINEX DM) 30-600 MG 12hr tablet Take 1 tablet by mouth as needed.   . fexofenadine (ALLEGRA) 180 MG tablet Take 180 mg by mouth as needed for allergies or rhinitis. Rotates with Claritin.  Marland Kitchen glipiZIDE-metformin (METAGLIP) 5-500 MG tablet TAKE (1) TABLET BY MOUTH ONCE DAILY.  . isosorbide mononitrate (IMDUR) 60 MG 24 hr tablet Take 1 tablet (60 mg total) by mouth daily.  Marland Kitchen lisinopril (PRINIVIL,ZESTRIL) 5 MG tablet Take 5 mg by mouth daily.  Marland Kitchen loratadine (CLARITIN) 10 MG tablet Take 10 mg by mouth as needed. Rotates with Allegra.  . metoprolol succinate (TOPROL-XL) 25 MG 24 hr tablet TAKE 2 TABLETS BY MOUTH ONCE DAILY.  . nitroGLYCERIN  (NITROSTAT) 0.4 MG SL tablet Place 1 tablet (0.4 mg total) under the tongue every 5 (five) minutes as needed. For chest pain  . ONETOUCH ULTRA test strip USE AS DIRECTED UP TO 3 TIMES DAILY IF NEEDED.  Marland Kitchen pantoprazole (PROTONIX) 40 MG tablet Take 40 mg by mouth as needed.   . ranolazine (RANEXA) 500 MG 12 hr tablet Take 500 mg by mouth 2 (two) times daily.   . rosuvastatin (CRESTOR) 20 MG tablet TAKE 1 TABLET ONCE DAILY AT 6 P.M.     Allergies:   Propoxyphene n-acetaminophen, Statins, Tape, Zetia [ezetimibe], Flomax [tamsulosin hcl], Levaquin [levofloxacin hemihydrate], and Penicillins   Social History   Socioeconomic History  . Marital status: Married    Spouse name: Not on file  . Number of children: 3  . Years of education: Not on file  . Highest education level: Not on file  Occupational History  . Occupation: Retired    Fish farm manager: [employer]  Tobacco Use  . Smoking status: Former Smoker    Years: 50.00    Quit date: 05/12/1999    Years since quitting: 20.8  . Smokeless tobacco: Never Used  Vaping Use  . Vaping Use: Never used  Substance and Sexual Activity  . Alcohol use: No  . Drug use: No  . Sexual activity: Never  Other Topics Concern  . 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   Social Determinants of Health   Financial Resource Strain:   . Difficulty of Paying Living Expenses:   Food Insecurity:   . Worried About Charity fundraiser in the Last Year:   . Arboriculturist in the Last Year:   Transportation Needs:   . Film/video editor (Medical):   Marland Kitchen Lack of Transportation (Non-Medical):   Physical Activity:   . Days of Exercise per Week:   . Minutes of Exercise per Session:   Stress:   . Feeling of Stress :   Social Connections:   . Frequency of Communication with Friends and Family:   . Frequency of Social Gatherings with Friends and Family:   .  Attends Religious Services:   . Active Member of Clubs or Organizations:   . Attends Archivist Meetings:   Marland Kitchen Marital Status:      Family History: The patient's family history includes Arrhythmia (age of onset: 69) in his mother. There is no history of Colon cancer, Liver disease, Inflammatory bowel disease, or Colon polyps.  ROS:   Please see the history of present illness.     All other systems reviewed and are negative.  EKGs/Labs/Other Studies Reviewed:    The following studies were reviewed today: Cath Jan 2019  EKG:  EKG is ordered today.  The ekg ordered today demonstrates NSR, HR 57  Recent Labs: 05/04/2019: TSH 0.65 01/12/2020: Hemoglobin 12.3; Platelets 164 03/20/2020: ALT 13; BUN 12; Creat 1.46; Potassium 4.8; Sodium 139  Recent Lipid Panel    Component Value Date/Time   CHOL 131 12/08/2019 1041   CHOL 117 03/22/2019 0834   TRIG 143 12/08/2019 1041   HDL 39 (L) 12/08/2019 1041   HDL 41 03/22/2019 0834   CHOLHDL 3.4 12/08/2019 1041   VLDL 27 08/30/2017 0315   LDLCALC 69 12/08/2019 1041   LDLDIRECT 66 04/13/2013 0732    Physical Exam:    VS:  BP 96/60   Pulse (!) 57   Ht 6' (1.829 m)   Wt 216 lb 6.4 oz (98.2 kg)   BMI 29.35 kg/m     Wt Readings from Last 3 Encounters:  03/23/20 216 lb 6.4 oz (98.2 kg)  03/20/20 217 lb (98.4 kg)  01/11/20 217 lb 3.2 oz (98.5 kg)     GEN:  Well nourished, well developed in no acute distress HEENT: Normal NECK: No JVD; No carotid bruits CARDIAC: RRR, no murmurs, rubs, gallops RESPIRATORY:  Clear to auscultation without rales, wheezing or rhonchi  ABDOMEN: Soft, non-tender, non-distended MUSCULOSKELETAL:  No edema; No deformity  SKIN: Warm and dry NEUROLOGIC:  Alert and oriented x 3 PSYCHIATRIC:  Normal affect   ASSESSMENT:    Myositis of thigh Presumably secondary to statin Rx  CAD S/P percutaneous coronary angioplasty Om1 BMS [id number], ISR July 2012 Rx'd with cutting balloon, ISR again Dec 2012 Rx'd  with DES. finally noted to be  occluded with an unsuccessful PCI attempt Jan 2019. No significant residual disease. Normal LVF.   Dyslipidemia LDL 65 Sept 2020 on Crestor 20 mg. This has now been discontinued.  Peripheral vascular disease ([place]) Lt SFA PTA 2006, dopplers OK Jan 2020  Non-insulin treated type 2 diabetes mellitus ([place]) Type II on oral agents  Essential hypertension Controlled  PLAN:    Agree with stopping statin Rx. Check fasting lipids, CMET, and CK in 4 weeks.  I will arrange for a pharmacy consult to get the patient on PCSK9 agent in 5 weeks.    Medication Adjustments/Labs and Tests Ordered: Current medicines are reviewed at length with the patient today.  Concerns regarding medicines are outlined above.  Orders Placed This Encounter  Procedures  . CK (Creatine Kinase)  . Lipid panel  . Comprehensive metabolic panel   No orders of the defined types were placed in this encounter.   Patient Instructions  Medication Instructions:  Your physician recommends that you continue on your current medications as directed. Please refer to the Current Medication list given to you today.  *If you need a refill on your cardiac medications before your next appointment, please call your pharmacy*   Lab Work: Your physician recommends that you return for a FASTING lipid profile, CMET, and CK (Creatinine Kinase) in 4 weeks. (Needs to be done the week before your appointment with our Pharmacist)  If you have labs (blood work) drawn today and your tests are completely normal, you will receive your results only by: Marland Kitchen MyChart Message (if you have MyChart) OR . A paper copy in the mail If you have any lab test that is abnormal or we need to change your treatment, we will call you to review the results.   Follow-Up: At [facility], you and your health needs are our priority.  As part of our continuing mission to provide you with exceptional heart care, we have created  designated Provider Care Teams.  These Care Teams include your primary Cardiologist (physician) and Advanced Practice Providers (APPs -  Physician Assistants and Nurse Practitioners) who all work together to provide you with the care you need, when you need it.  We recommend signing up for the patient portal called "MyChart".  Sign up information is provided on this After Visit Summary.  MyChart is used to connect with patients for Virtual Visits (Telemedicine).  Patients are able to view lab/test results, encounter notes, upcoming appointments, etc.  Non-urgent messages can be sent to your provider as well.   To learn more about what you can do with MyChart, go to [email or web address].    Your next appointment:   5 week(s)  The format for your next appointment:   In Person  Provider:   Pharmacist for PCSK9 therapy   Your physician recommends that you schedule a follow-up appointment in: 6 months in person with Dr. Gwenlyn Found. Please call our office 2 months in advance to schedule this follow-up appointment.      Angelena Form, PA-C  03/23/2020 9:01 AM    [facility]

## 2020-03-23 NOTE — Assessment & Plan Note (Signed)
Om1 BMS [id number], ISR July 2012 Rx'd with cutting balloon, ISR again Dec 2012 Rx'd with DES. finally noted to be occluded with an unsuccessful PCI attempt Jan 2019. No significant residual disease. Normal LVF.

## 2020-03-23 NOTE — Assessment & Plan Note (Signed)
Controlled.  

## 2020-03-23 NOTE — Patient Instructions (Signed)
Medication Instructions:  Your physician recommends that you continue on your current medications as directed. Please refer to the Current Medication list given to you today.  *If you need a refill on your cardiac medications before your next appointment, please call your pharmacy*   Lab Work: Your physician recommends that you return for a FASTING lipid profile, CMET, and CK (Creatinine Kinase) in 4 weeks. (Needs to be done the week before your appointment with our Pharmacist)  If you have labs (blood work) drawn today and your tests are completely normal, you will receive your results only by:  [place] (if you have MyChart) OR  A paper copy in the mail If you have any lab test that is abnormal or we need to change your treatment, we will call you to review the results.   Follow-Up: At [facility], you and your health needs are our priority.  As part of our continuing mission to provide you with exceptional heart care, we have created designated Provider Care Teams.  These Care Teams include your primary Cardiologist (physician) and Advanced Practice Providers (APPs -  Physician Assistants and Nurse Practitioners) who all work together to provide you with the care you need, when you need it.  We recommend signing up for the patient portal called "MyChart".  Sign up information is provided on this After Visit Summary.  MyChart is used to connect with patients for Virtual Visits (Telemedicine).  Patients are able to view lab/test results, encounter notes, upcoming appointments, etc.  Non-urgent messages can be sent to your provider as well.   To learn more about what you can do with MyChart, go to [email or web address].    Your next appointment:   5 week(s)  The format for your next appointment:   In Person  Provider:   Pharmacist for PCSK9 therapy   Your physician recommends that you schedule a follow-up appointment in: 6 months in person with Dr. Gwenlyn Found. Please call our  office 2 months in advance to schedule this follow-up appointment.

## 2020-03-23 NOTE — Assessment & Plan Note (Signed)
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

## 2020-03-23 NOTE — Assessment & Plan Note (Signed)
LDL 65 Sept 2020 on Crestor 20 mg. This has now been discontinued.

## 2020-03-23 NOTE — Assessment & Plan Note (Signed)
Presumably secondary to statin Rx

## 2020-03-23 NOTE — Assessment & Plan Note (Signed)
Lt SFA PTA 2006, dopplers OK Jan 2020

## 2020-03-28 ENCOUNTER — Other Ambulatory Visit (INDEPENDENT_AMBULATORY_CARE_PROVIDER_SITE_OTHER): Payer: Self-pay | Admitting: Internal Medicine

## 2020-04-04 NOTE — Addendum Note (Signed)
Addended by: Therisa Doyne on: 04/04/2020 02:33 PM   Modules accepted: Orders

## 2020-04-20 DIAGNOSIS — I2510 Atherosclerotic heart disease of native coronary artery without angina pectoris: Secondary | ICD-10-CM | POA: Diagnosis not present

## 2020-04-20 DIAGNOSIS — I214 Non-ST elevation (NSTEMI) myocardial infarction: Secondary | ICD-10-CM | POA: Diagnosis not present

## 2020-04-20 DIAGNOSIS — E785 Hyperlipidemia, unspecified: Secondary | ICD-10-CM | POA: Diagnosis not present

## 2020-04-20 DIAGNOSIS — I10 Essential (primary) hypertension: Secondary | ICD-10-CM | POA: Diagnosis not present

## 2020-04-20 DIAGNOSIS — I739 Peripheral vascular disease, unspecified: Secondary | ICD-10-CM | POA: Diagnosis not present

## 2020-04-20 DIAGNOSIS — Z9861 Coronary angioplasty status: Secondary | ICD-10-CM | POA: Diagnosis not present

## 2020-04-21 LAB — LIPID PANEL
Chol/HDL Ratio: 4.9 ratio (ref 0.0–5.0)
Cholesterol, Total: 180 mg/dL (ref 100–199)
HDL: 37 mg/dL — ABNORMAL LOW (ref >39)
LDL Chol Calc (NIH): 110 mg/dL — ABNORMAL HIGH (ref 0–99)
Triglycerides: 188 mg/dL — ABNORMAL HIGH (ref 0–149)
VLDL Cholesterol Cal: 33 mg/dL (ref 5–40)

## 2020-04-21 LAB — COMPREHENSIVE METABOLIC PANEL
ALT: 13 IU/L (ref 0–44)
AST: 18 IU/L (ref 0–40)
Albumin/Globulin Ratio: 1.3 (ref 1.2–2.2)
Albumin: 4 g/dL (ref 3.7–4.7)
Alkaline Phosphatase: 48 IU/L (ref 48–121)
BUN/Creatinine Ratio: 12 (ref 10–24)
BUN: 17 mg/dL (ref 8–27)
Bilirubin Total: 0.7 mg/dL (ref 0.0–1.2)
CO2: 24 mmol/L (ref 20–29)
Calcium: 10.2 mg/dL (ref 8.6–10.2)
Chloride: 104 mmol/L (ref 96–106)
Creatinine, Ser: 1.41 mg/dL — ABNORMAL HIGH (ref 0.76–1.27)
GFR calc Af Amer: 57 mL/min/{1.73_m2} — ABNORMAL LOW (ref >59)
GFR calc non Af Amer: 49 mL/min/{1.73_m2} — ABNORMAL LOW (ref >59)
Globulin, Total: 3 g/dL (ref 1.5–4.5)
Glucose: 112 mg/dL — ABNORMAL HIGH (ref 65–99)
Potassium: 4 mmol/L (ref 3.5–5.2)
Sodium: 140 mmol/L (ref 134–144)
Total Protein: 7 g/dL (ref 6.0–8.5)

## 2020-04-21 LAB — CK: Total CK: 494 U/L — ABNORMAL HIGH (ref 41–331)

## 2020-04-25 ENCOUNTER — Encounter: Payer: Self-pay | Admitting: Cardiology

## 2020-04-26 ENCOUNTER — Other Ambulatory Visit: Payer: Self-pay

## 2020-04-26 ENCOUNTER — Ambulatory Visit (INDEPENDENT_AMBULATORY_CARE_PROVIDER_SITE_OTHER): Payer: PPO | Admitting: Pharmacist

## 2020-04-26 ENCOUNTER — Telehealth: Payer: Self-pay | Admitting: Cardiology

## 2020-04-26 VITALS — BP 112/80 | HR 62 | Resp 16 | Ht 72.0 in | Wt 213.6 lb

## 2020-04-26 DIAGNOSIS — Z79899 Other long term (current) drug therapy: Secondary | ICD-10-CM

## 2020-04-26 DIAGNOSIS — E785 Hyperlipidemia, unspecified: Secondary | ICD-10-CM

## 2020-04-26 NOTE — Progress Notes (Signed)
Patient ID: [name]                 DOB: 06/29/1946                    MRN: [id number]     HPI: [name] is a 74 y.o. male patient of Dr Gwenlyn Found referred to lipid clinic by Kerin Ransom PA-C. PMH is significant for hyperlipidemia, CAD s/p NSTEMI and PCI, DM, HF, hypertension, PVD, and vitamin D. 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 Noted patient had elevated CK and muscle pain while on rosuvastatin. Chart review also showed muscle pain with simvastatin, ezetimibe, pitavastatin, and rosuvastatin. Noted repeat metabolic panel shows Ast, Alt and ALP within normal limits. CK remains elevated but decreased from 616 to 494. Patient presents to discuss option for PCSK9i as lipid management therapy.   Current Medications: none  Intolerances:  Rosuvastatin 20mg  daily - elevated CK while on rosuvastatin pitavastatin 2mg  daily - severe muscle pain Fenofibrate 145mg  daily Simvastatin 20mg  daily - muscle pain Ezetimibe - muscle pain  LDL goal: < 70mg /dL (55mg /dL)  Diet: lots of fish, rarely eat out,   Exercise: walks 2-3 miles 5 days per week  Family History: mother had heart problems (atrial fibrillation)  Social History: former smoker, denies alcohol intake  Labs:  04/20/2020: CHO 180, TG 188, HDL 37, LDL-c 110, CK 494, ALP 48, AST 18, Alt 13  Past Medical History:  Diagnosis Date  . CHF (congestive heart failure) ([place])   . Coronary artery disease    s/p multiple caths 2012, stenting 1/19 NSTEMI with occlusion of OM, unable to wire-->Rx therapy, normal EF  . Diabetes mellitus   . GERD (gastroesophageal reflux disease)   . Hyperlipidemia   . Hypertension   . MI (myocardial infarction) ([place]) 2019  . PVD (peripheral vascular disease) ([place])    left SFA PTA & stenting in 02/2005 (Dr. Adora Fridge)  . Vitamin D deficiency disease 05/04/2019    Current Outpatient Medications on File Prior to Visit  Medication Sig Dispense Refill  . acetaminophen (TYLENOL) 500  MG tablet Take 1,000 mg by mouth as needed for moderate pain.     Marland Kitchen aspirin 81 MG chewable tablet Chew 1 tablet (81 mg total) by mouth daily.    . Cholecalciferol (VITAMIN D-3) 125 MCG (5000 UT) TABS Take 5,000 Units by mouth daily.     . clopidogrel (PLAVIX) 75 MG tablet TAKE ONE TABLET BY MOUTH ONCE DAILY. (Patient taking differently: Take 75 mg by mouth daily. ) 30 tablet 9  . Coenzyme Q10 (CO Q-10) 400 MG CAPS Take 400 mg by mouth daily.    . cyclobenzaprine (FLEXERIL) 10 MG tablet Take 10 mg by mouth 3 (three) times daily as needed for muscle spasms.    Marland Kitchen dexlansoprazole (DEXILANT) 60 MG capsule Take 60 mg by mouth daily.    Marland Kitchen dextromethorphan-guaiFENesin (MUCINEX DM) 30-600 MG 12hr tablet Take 1 tablet by mouth as needed.     . fexofenadine (ALLEGRA) 180 MG tablet Take 180 mg by mouth as needed for allergies or rhinitis. Rotates with Claritin.    Marland Kitchen glipiZIDE-metformin (METAGLIP) 5-500 MG tablet TAKE (1) TABLET BY MOUTH ONCE DAILY. 90 tablet 0  . isosorbide mononitrate (IMDUR) 60 MG 24 hr tablet Take 1 tablet (60 mg total) by mouth daily. 90 tablet 3  . lisinopril (PRINIVIL,ZESTRIL) 5 MG tablet Take 5 mg by mouth daily.    Marland Kitchen loratadine (CLARITIN)  10 MG tablet Take 10 mg by mouth as needed. Rotates with Allegra.    . metoprolol succinate (TOPROL-XL) 25 MG 24 hr tablet TAKE 2 TABLETS BY MOUTH ONCE DAILY. 180 tablet 2  . nitroGLYCERIN (NITROSTAT) 0.4 MG SL tablet Place 1 tablet (0.4 mg total) under the tongue every 5 (five) minutes as needed. For chest pain 25 tablet 3  . ONETOUCH ULTRA test strip USE AS DIRECTED UP TO 3 TIMES DAILY IF NEEDED. 100 strip 3  . pantoprazole (PROTONIX) 40 MG tablet Take 40 mg by mouth as needed.     . ranolazine (RANEXA) 500 MG 12 hr tablet Take 500 mg by mouth 2 (two) times daily.     . [DISCONTINUED] glipiZIDE-metformin (METAGLIP) 5-500 MG tablet Take 1 tablet by mouth every other day.     . [DISCONTINUED] ONETOUCH ULTRA test strip USE AS DIRECTED UP TO 3 TIMES  DAILY IF NEEDED. 100 strip 0   No current facility-administered medications on file prior to visit.    Allergies  Allergen Reactions  . Crestor [Rosuvastatin]     myalgia  . Propoxyphene N-Acetaminophen Nausea Only  . Statins Other (See Comments)    Severe muscle cramping/aching/pain/ elevated CK  . Tape     Blisters  . Zetia [Ezetimibe]     Muscle cramping  . Zocor [Simvastatin]     myalgia  . Flomax [Tamsulosin Hcl] Rash  . Levaquin [Levofloxacin Hemihydrate] Rash  . Penicillins Rash    Broke out in rash 6 years ago, pt recently took penicillin (09/2016) and had no reaction Has patient had a PCN reaction causing immediate rash, facial/tongue/throat swelling, SOB or lightheadedness with hypotension: Yes Has patient had a PCN reaction causing severe rash involving mucus membranes or skin necrosis: Unknown Has patient had a PCN reaction that required hospitalization: No Has patient had a PCN reaction occurring within the last 10 years: Yes If all of the above answers are "NO", then m    Dyslipidemia LDL is well above desired goal for secondary prevention. Noted patient was on rosuvastatin 20mg  daily for amlost 1 year and medication was discontinued due to muscle pain and elevated CK. Patient continues to walk 2-3 miles 5 days per week and reports significant improvement in muscle pain after statin discontinuation. Patient is also intolerant to pituvastatin, simvastatin, ezetimibe,and fenofibrate.  We discuss need to rest the muscle and increase hydration to continue to improve CK level. Will initiate prior authorization process for PCSK9 inhibitor therapy, but plan to repeat CK in 3-4 weeks and start PCSK9i after CK improved to normal level.  Plan to follow up as needed and repeat fasting blood work (including CK) after 4 doses of new medication.   (Plan was discussed with [facility]/Lipidologist - Dr Debara Pickett)   [name] PharmD, BCPS, [place] [address] [place] [address] 04/30/2020 9:07 AM

## 2020-04-26 NOTE — Telephone Encounter (Signed)
I reviewed Mr Blaker's labs with Dr Debara Pickett in the lipid clinic.  I was concerned about his persistently elevated CK.  Dr Debara Pickett notes that's CK elevation can persist for months with statin myopathy.  He offered to see the patient in the lipid clinic which I will arrange.  In the meantime he has been started on a PCSK9 through the pharmacy clinic. I'll continue this and check lipids and a CK in 4 weeks and he can see Dr Debara Pickett in 5 weeks. I discussed the plan with Mr Nelis today on the phone.  He did note his thigh pain is improving.  Kerin Ransom PA-C 04/26/2020 10:40 AM

## 2020-04-26 NOTE — Patient Instructions (Addendum)
Your Results:             Your most recent labs Goal  Total Cholesterol 180 < 200  Triglycerides 188 < 150  HDL (good cholesterol) 37 > 40  LDL (bad cholesterol) 110 < 70     Medication changes: *START prior authorization for Repatha/Praluent every 14 days*  Lab orders: *Repeat CK in 3-4 weeks prior to PCSK9i 1st dose *Plan to repeat fasting blood work after 4 doses of new medication*  Clinic phone number: *[name]/Kristin/[place]  [phone]   Thank you for choosing [facility]

## 2020-04-30 ENCOUNTER — Encounter: Payer: Self-pay | Admitting: Pharmacist

## 2020-04-30 NOTE — Assessment & Plan Note (Addendum)
LDL is well above desired goal for secondary prevention. Noted patient was on rosuvastatin 20mg  daily for amlost 1 year and medication was discontinued due to muscle pain and elevated CK. Patient continues to walk 2-3 miles 5 days per week and reports significant improvement in muscle pain after statin discontinuation. Patient is also intolerant to pituvastatin, simvastatin, ezetimibe,and fenofibrate.  We discuss need to rest the muscle and increase hydration to continue to improve CK level. Will initiate prior authorization process for PCSK9 inhibitor therapy, but plan to repeat CK in 3-4 weeks and start PCSK9i after CK improved to normal level.  Plan to follow up as needed and repeat fasting blood work (including CK) after 4 doses of new medication.   (Plan was discussed with DoD/Lipidologist - Dr Debara Pickett)

## 2020-05-09 ENCOUNTER — Telehealth: Payer: Self-pay

## 2020-05-09 MED ORDER — REPATHA SURECLICK 140 MG/ML ~~LOC~~ SOAJ: 140.0000 mg | SUBCUTANEOUS | 11 refills | Status: DC

## 2020-05-09 NOTE — Telephone Encounter (Signed)
Pt picked up the repatha at the [facility] but would like to be mailed an rx for repatha which I will do

## 2020-05-09 NOTE — Telephone Encounter (Signed)
Called and lmomed the pt to start repatha, rx sent, instructed the pt to call back if unaffordable

## 2020-05-15 ENCOUNTER — Telehealth: Payer: Self-pay

## 2020-05-15 NOTE — Telephone Encounter (Signed)
lmomed for labs

## 2020-05-15 NOTE — Telephone Encounter (Signed)
-----   Message from Harrington Challenger, [place] sent at 05/14/2020  5:32 PM EDT ----- Regarding: FW: CK and PCSk9i Please call patient to remind his of repeat blood work next week. Need to check CK and liver function.  Raquel Rodriguez-Guzman PharmD, BCPS, [place] [address] 05/14/2020 5:33 PM ----- Message ----- From: Harrington Challenger, RPH-CPP Sent: 05/14/2020 To: Roxanne Mins Rodriguez-Guzman, RPH-CPP Subject: CK and PCSk9i                                  Repeat blood work (CK and LFTs). Start Repatha if levels at normal limits

## 2020-05-17 DIAGNOSIS — E785 Hyperlipidemia, unspecified: Secondary | ICD-10-CM | POA: Diagnosis not present

## 2020-05-17 DIAGNOSIS — Z79899 Other long term (current) drug therapy: Secondary | ICD-10-CM | POA: Diagnosis not present

## 2020-05-18 LAB — HEPATIC FUNCTION PANEL
ALT: 11 IU/L (ref 0–44)
AST: 14 IU/L (ref 0–40)
Albumin: 4 g/dL (ref 3.7–4.7)
Alkaline Phosphatase: 49 IU/L (ref 44–121)
Bilirubin Total: 0.4 mg/dL (ref 0.0–1.2)
Bilirubin, Direct: 0.14 mg/dL (ref 0.00–0.40)
Total Protein: 6.8 g/dL (ref 6.0–8.5)

## 2020-05-18 LAB — CK: Total CK: 330 U/L (ref 41–331)

## 2020-05-28 ENCOUNTER — Other Ambulatory Visit: Payer: Self-pay | Admitting: Cardiovascular Disease

## 2020-05-28 ENCOUNTER — Other Ambulatory Visit (INDEPENDENT_AMBULATORY_CARE_PROVIDER_SITE_OTHER): Payer: Self-pay | Admitting: Internal Medicine

## 2020-06-01 ENCOUNTER — Telehealth: Payer: Self-pay | Admitting: *Deleted

## 2020-06-01 DIAGNOSIS — Z79899 Other long term (current) drug therapy: Secondary | ICD-10-CM

## 2020-06-01 DIAGNOSIS — E785 Hyperlipidemia, unspecified: Secondary | ICD-10-CM

## 2020-06-01 NOTE — Telephone Encounter (Signed)
-----   Message from Erlene Quan, [place] sent at 04/26/2020 10:31 AM EDT ----- Hebert Soho please arrange for fasting lipids and a creatinine kinase level in 4 weeks for Mr Sens. He needs an appointment with Dr Debara Pickett in the lipid clinic in 5 weeks (I have spoken to Dr Debara Pickett).  Kerin Ransom PA-C 04/26/2020 10:33 AM

## 2020-06-01 NOTE — Telephone Encounter (Signed)
Lipid and creatinine Kinase ordered and lab slip mail to pt

## 2020-06-16 ENCOUNTER — Other Ambulatory Visit: Payer: Self-pay | Admitting: Adult Health

## 2020-06-16 DIAGNOSIS — Z955 Presence of coronary angioplasty implant and graft: Secondary | ICD-10-CM

## 2020-06-19 HISTORY — PX: TOOTH EXTRACTION: SUR596

## 2020-06-20 ENCOUNTER — Ambulatory Visit (INDEPENDENT_AMBULATORY_CARE_PROVIDER_SITE_OTHER): Payer: PPO | Admitting: Internal Medicine

## 2020-06-20 ENCOUNTER — Telehealth: Payer: Self-pay | Admitting: Cardiovascular Disease

## 2020-06-20 ENCOUNTER — Encounter (INDEPENDENT_AMBULATORY_CARE_PROVIDER_SITE_OTHER): Payer: Self-pay | Admitting: Internal Medicine

## 2020-06-20 ENCOUNTER — Other Ambulatory Visit: Payer: Self-pay

## 2020-06-20 VITALS — BP 120/68 | HR 60 | Temp 97.3°F | Ht 70.0 in | Wt 211.6 lb

## 2020-06-20 DIAGNOSIS — Z0001 Encounter for general adult medical examination with abnormal findings: Secondary | ICD-10-CM

## 2020-06-20 DIAGNOSIS — E559 Vitamin D deficiency, unspecified: Secondary | ICD-10-CM | POA: Diagnosis not present

## 2020-06-20 DIAGNOSIS — I739 Peripheral vascular disease, unspecified: Secondary | ICD-10-CM

## 2020-06-20 DIAGNOSIS — Z23 Encounter for immunization: Secondary | ICD-10-CM

## 2020-06-20 DIAGNOSIS — E119 Type 2 diabetes mellitus without complications: Secondary | ICD-10-CM | POA: Diagnosis not present

## 2020-06-20 DIAGNOSIS — E785 Hyperlipidemia, unspecified: Secondary | ICD-10-CM | POA: Diagnosis not present

## 2020-06-20 DIAGNOSIS — R6882 Decreased libido: Secondary | ICD-10-CM

## 2020-06-20 DIAGNOSIS — I10 Essential (primary) hypertension: Secondary | ICD-10-CM

## 2020-06-20 DIAGNOSIS — Z125 Encounter for screening for malignant neoplasm of prostate: Secondary | ICD-10-CM | POA: Diagnosis not present

## 2020-06-20 NOTE — Progress Notes (Addendum)
Chief Complaint: This very pleasant 74 year old man comes in for an annual physical exam and to address his chronic conditions which are described below. HPI: He has type 2 diabetes and is currently taking a combination of Metformin and glipizide.  His hemoglobin A1c on the last visit was not optimal at 7.4%. He checks his sugars usually once a day. He tries to eat as healthy as he can and exercise on a regular basis throughout the week. He has been seen by cardiology and has been started on Repatha every 2 weeks for his hyperlipidemia/dyslipidemia. He describes fatigue, decreased libido, poor focus and concentration.  Previously, he was on testosterone therapy but this was discontinued after his coronary artery disease issues.  He is willing to check his testosterone levels again. He continues with vitamin D3 at 6000 units daily for vitamin D deficiency.  He gets vitamin D3 from the [place]. He also continues on PPI for gastroesophageal reflux disease.  Past Medical History:  Diagnosis Date  . CHF (congestive heart failure) ([place])   . Coronary artery disease    s/p multiple caths 2012, stenting 1/19 NSTEMI with occlusion of OM, unable to wire-->Rx therapy, normal EF  . Diabetes mellitus   . GERD (gastroesophageal reflux disease)   . Hyperlipidemia   . Hypertension   . MI (myocardial infarction) ([place]) 2019  . PVD (peripheral vascular disease) ([place])    left SFA PTA & stenting in 02/2005 (Dr. Adora Fridge)  . Vitamin D deficiency disease 05/04/2019   Past Surgical History:  Procedure Laterality Date  . BIOPSY  08/02/2019   Procedure: BIOPSY;  Surgeon: Daneil Dolin, MD;  [facility];  Service: Endoscopy;;  gastric   . CARDIAC CATHETERIZATION  12/23/2004   normal L main, normal LAD, normal L Cfx, RCA with 20% hypodense lesion in first end of vessel (Dr. Adora Fridge)  . CARDIAC CATHETERIZATION  08/26/2007   no significant CAD by cath, EF 50% (Dr. Jackie Plum)  . CARDIAC CATHETERIZATION   08/28/2010   stent to OM1 with 2.0x73mm BMS (Dr. Adora Fridge)  . CARDIAC CATHETERIZATION  09/11/2010   patent stent (Dr. Roni Bread)  . CARDIAC CATHETERIZATION  02/10/2011   95% prox in-stent restenosis within OM stent - opened with cutting balloon (Dr. Corky Downs)  . CARDIAC CATHETERIZATION  07/17/2011   in-stent restenosis - re-stented with Promus 2.25x55mm DES (Dr. Roni Bread)  . COLONOSCOPY  12/2009   Dr. Hampton Abbot  . COLONOSCOPY N/A 01/30/2017   pancolonic diverticulosis, non-bleeding internal hemorrhoids.  . COLONOSCOPY WITH PROPOFOL N/A 09/08/2019   Procedure: COLONOSCOPY WITH PROPOFOL;  Surgeon: Daneil Dolin, MD;  [facility];  Service: Endoscopy;  Laterality: N/A;  11:15am  . CORONARY BALLOON ANGIOPLASTY N/A 08/30/2017   Procedure: CORONARY BALLOON ANGIOPLASTY;  Surgeon: [name], MD;  Location: [facility];  Service: Cardiovascular;  Laterality: N/A;  . 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 ESOPHAGOGASTRODUODENOSCOPY N/A 08/02/2019   Procedure: ESOPHAGOGASTRODUODENOSCOPY (EGD);  Surgeon: Daneil Dolin, MD;  [facility];  Service: Endoscopy;  Laterality: N/A;  8:45am  . FEMORAL ARTERY STENT  02/27/2005   L SFA stenting - [place] down SFA across lesion - predilatation with 4x4 Powerflex, stenting with 7x4 Smart, post-dilatation with 6x4 powerflex (Dr. Adora Fridge)  . LEFT HEART CATH AND CORONARY ANGIOGRAPHY N/A 08/30/2017   Procedure: LEFT HEART CATH AND CORONARY ANGIOGRAPHY;  Surgeon: [name], MD;  Location: [facility];  Service: Cardiovascular;  Laterality: N/A;  . LEFT HEART CATHETERIZATION WITH CORONARY ANGIOGRAM N/A 07/17/2011   Procedure: LEFT HEART CATHETERIZATION WITH CORONARY ANGIOGRAM;  Surgeon: Leonie Man, MD;  Location[facility];  Service: Cardiovascular;  Laterality: N/A;  Right radial approach   . left knee arthroscopy  05/2016  . NM MYOCAR PERF WALL MOTION  09/08/2013   abnormal lexiscan - low to intermediate risk;   . POLYPECTOMY  09/08/2019   Procedure: POLYPECTOMY;  Surgeon: Daneil Dolin, MD;  [facility];  Service: Endoscopy;;  . TOOTH EXTRACTION Right 06/19/2020  . 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    Social History   Tobacco Use  . Smoking status: Former Smoker    Years: 50.00    Quit date: 05/12/1999    Years since quitting: 21.1  . Smokeless tobacco: Never Used  Substance Use Topics  . Alcohol use: No      Allergies:  Allergies  Allergen Reactions  . Crestor [Rosuvastatin]     myalgia  . Propoxyphene N-Acetaminophen Nausea Only  . Statins Other (See Comments)    Severe muscle cramping/aching/pain/ elevated CK  . Tape     Blisters  . Zetia [Ezetimibe]     Muscle cramping  . Zocor [Simvastatin]     myalgia  . Flomax [Tamsulosin Hcl] Rash  . Levaquin [Levofloxacin Hemihydrate] Rash  . Penicillins Rash    Broke out in rash 6 years ago, pt recently took penicillin (09/2016) and had no reaction Has patient had a PCN reaction causing immediate rash, facial/tongue/throat swelling, SOB or lightheadedness with hypotension: Yes Has patient had a PCN reaction causing severe rash involving mucus membranes or skin necrosis: Unknown Has patient had a PCN reaction that required hospitalization: No Has patient had a PCN reaction occurring within the last 10 years: Yes If all of the above answers are "NO", then m     Current Meds  Medication Sig  . acetaminophen (TYLENOL) 500 MG tablet Take 1,000 mg by mouth as needed for moderate pain.   Marland Kitchen aspirin 81 MG chewable tablet Chew 1  tablet (81 mg total) by mouth daily.  . Cholecalciferol (VITAMIN D3) 50 MCG (2000 UT) TABS Take 3 tablets by mouth daily.  . clopidogrel (PLAVIX) 75 MG tablet TAKE ONE TABLET BY MOUTH ONCE DAILY. (Patient taking differently: Take 75 mg by mouth daily. )  . Coenzyme Q10 (CO Q-10) 400 MG CAPS Take 400 mg by mouth daily.  Marland Kitchen dexlansoprazole (DEXILANT) 60 MG capsule Take 60 mg by mouth daily.  Marland Kitchen dextromethorphan-guaiFENesin (MUCINEX DM) 30-600 MG 12hr tablet Take 1 tablet by mouth as needed.   . Evolocumab (REPATHA SURECLICK) 893 MG/ML SOAJ Inject 140 mg into the skin every 14 (fourteen) days.  . fexofenadine (ALLEGRA) 180 MG tablet Take 180 mg by mouth as needed for allergies or rhinitis. Rotates with Claritin.  Marland Kitchen glipiZIDE-metformin (METAGLIP) 5-500 MG tablet TAKE (1) TABLET BY MOUTH ONCE DAILY.  . isosorbide mononitrate (IMDUR) 60 MG 24 hr tablet Take 1 tablet (60 mg total) by mouth daily.  Marland Kitchen lisinopril (PRINIVIL,ZESTRIL) 5 MG tablet Take 5 mg by mouth daily.  Marland Kitchen loratadine (CLARITIN) 10 MG tablet Take  10 mg by mouth as needed. Rotates with Allegra.  . metoprolol succinate (TOPROL-XL) 25 MG 24 hr tablet TAKE 2 TABLETS BY MOUTH ONCE DAILY.  . nitroGLYCERIN (NITROSTAT) 0.4 MG SL tablet DISSOLVE 1 TABLET UNDER TONGUE EVERY 5 MINUTES UP TO 15 MIN FOR CHEST PAIN. IF NO RELIEF CALL 911.  Glory Rosebush ULTRA test strip USE AS DIRECTED UP TO 3 TIMES DAILY IF NEEDED.  Marland Kitchen pantoprazole (PROTONIX) 40 MG tablet Take 40 mg by mouth as needed.   . ranolazine (RANEXA) 500 MG 12 hr tablet Take 500 mg by mouth 2 (two) times daily.   . [DISCONTINUED] Cholecalciferol (VITAMIN D-3) 125 MCG (5000 UT) TABS Take 5,000 Units by mouth daily.   . [DISCONTINUED] cyclobenzaprine (FLEXERIL) 10 MG tablet Take 10 mg by mouth 3 (three) times daily as needed for muscle spasms.       Depression screen [facility] 2/9 06/20/2020 08/17/2019  Decreased Interest 0 0  Down, Depressed, Hopeless 0 0  PHQ - 2 Score 0 0  Altered sleeping 0 -   Tired, decreased energy 0 -  Change in appetite 0 -  Feeling bad or failure about yourself  0 -  Trouble concentrating 0 -  Moving slowly or fidgety/restless 0 -  Suicidal thoughts 0 -  PHQ-9 Score 0 -  Difficult doing work/chores Not difficult at all -     JSH:FWYOV from the symptoms mentioned above,there are no other symptoms referable to all systems reviewed.       Physical Exam: Blood pressure 120/68, pulse 60, temperature (!) 97.3 F (36.3 C), temperature source Temporal, height 5\' 10"  (1.778 m), weight 211 lb 9.6 oz (96 kg), SpO2 96 %. Vitals with BMI 06/20/2020 04/26/2020 03/23/2020  Height 5\' 10"  6\' 0"  6\' 0"   Weight 211 lbs 10 oz 213 lbs 10 oz 216 lbs 6 oz  BMI 30.36 78.58 85.02  Systolic 774 128 96  Diastolic 68 80 60  Pulse 60 62 57      He looks systemically well but remains obese.  Blood pressure in a good range. General: Alert, cooperative, and appears to be the stated age.No pallor.  No jaundice.  No clubbing. Head: Normocephalic Eyes: Sclera white, pupils equal and reactive to light, red reflex x 2,  Ears: Normal bilaterally Oral cavity: Lips, mucosa, and tongue normal: Teeth and gums normal Neck: No adenopathy, supple, symmetrical, trachea midline, and thyroid does not appear enlarged Respiratory: Clear to auscultation bilaterally.No wheezing, crackles or bronchial breathing. Cardiovascular: Heart sounds are present and appear to be normal without murmurs or added sounds.  No carotid bruits.  Peripheral pulses are present and equal bilaterally.: Gastrointestinal:positive bowel sounds, no hepatosplenomegaly.  No masses felt.No tenderness. Skin: Clear, No rashes noted.No worrisome skin lesions seen. Neurological: Grossly intact without focal findings, cranial nerves II through XII intact, muscle strength equal bilaterally.  Microfilament testing of his feet does not show any evidence of peripheral neuropathy. Musculoskeletal: No acute joint abnormalities  noted.Full range of movement noted with joints. Psychiatric: Affect appropriate, non-anxious.    Assessment  1. Encounter for general adult medical examination with abnormal findings   2. Essential hypertension   3. Non-insulin treated type 2 diabetes mellitus ([place])   4. Dyslipidemia   5. Vitamin D deficiency disease   6. Peripheral vascular disease ([place])   7. Decreased libido   8. Special screening for malignant neoplasm of prostate     Tests Ordered:   Orders Placed This Encounter  Procedures  . CBC  .  COMPLETE METABOLIC PANEL WITH GFR  . Hemoglobin A1c  . PSA, Total with Reflex to PSA, Free  . T3, free  . T4  . TSH  . VITAMIN D 25 Hydroxy (Vit-D Deficiency, Fractures)  . Testosterone Total,Free,Bio, Males     Plan  1. Fairly healthy 74 year old man despite coronary artery disease. 2. Continue with current antihypertensive therapy which is listed above as this seems to be controlling his blood pressure. 3. Continue with oral hypoglycemic agents for his diabetes and we will check an A1c today. 4. As far as his dyslipidemia is concerned, medications that he is currently on I do not think will address the main issue which is reducing visceral fat. 5. We will check his testosterone levels to see if we can have therapeutic options here as testosterone has been shown to reduce visceral fat and because of the conversion to estradiol, there may be reduction in plaque, thereby giving further benefit.  This is well-documented in the medical literature. 6. High-dose influenza vaccination and Tdap vaccines were given today. 7. Further recommendations will depend on blood results and I will see him soon to discuss all these results. 8. Today, in addition to a preventative visit, I addressed his chronic conditions above and an office visit     No orders of the defined types were placed in this encounter.    Shironda Kain C Chananya Canizalez   06/20/2020, 8:34 AM

## 2020-06-20 NOTE — Telephone Encounter (Signed)
Previous cardiac catheterization on [name] in January 2019 revealed occluded ostial OM1 lesion, normal EF 30 to 35%.  Attempt at opening OM1 lesion was unsuccessful.  No significant coronary artery disease otherwise.  Patient has been having intermittent chest pain since last Thursday.  There is no exacerbating factors such as deep inspiration, body rotation or palpation.  He has been using as needed dose of nitroglycerin which seemed to help.  Symptom last anywhere from a few minutes to up to 10 minutes.  He is aware that if he still have chest pain after the third nitroglycerin he needs to go to the hospital.  Otherwise he is chest pain-free at this point.  He says he is not sure if his chest pain is related to his acid reflux or if this is something new.  When asked about his previous anginal symptom from 2019, he says the current symptom feels different.  We will add onto the earliest DOD slot for evaluation of chest pain.

## 2020-06-20 NOTE — Telephone Encounter (Signed)
Pt c/o of Chest Pain: STAT if CP now or developed within 24 hours  1. Are you having CP right now? no  2. Are you experiencing any other symptoms (ex. SOB, nausea, vomiting, sweating)? lightheaded  3. How long have you been experiencing CP? Since last thursday  4. Is your CP continuous or coming and going? Comes and goes  5. Have you taken Nitroglycerin? Yes, took 3 last week   Patient states he has been having chest pain since last Thursday. He states he is not having symptoms now. ?

## 2020-06-20 NOTE — Addendum Note (Signed)
Addended by: Anibal Henderson on: 06/20/2020 08:59 AM   Modules accepted: Orders

## 2020-06-21 LAB — TESTOSTERONE TOTAL,FREE,BIO, MALES
Albumin: 4 g/dL (ref 3.6–5.1)
Sex Hormone Binding: 54 nmol/L (ref 22–77)
Testosterone, Bioavailable: 88.9 ng/dL (ref 15.0–150.0)
Testosterone, Free: 48.3 pg/mL (ref 6.0–73.0)
Testosterone: 536 ng/dL (ref 250–827)

## 2020-06-21 LAB — CBC
HCT: 39.3 % (ref 38.5–50.0)
Hemoglobin: 12.9 g/dL — ABNORMAL LOW (ref 13.2–17.1)
MCH: 32.1 pg (ref 27.0–33.0)
MCHC: 32.8 g/dL (ref 32.0–36.0)
MCV: 97.8 fL (ref 80.0–100.0)
MPV: 10.6 fL (ref 7.5–12.5)
Platelets: 173 10*3/uL (ref 140–400)
RBC: 4.02 10*6/uL — ABNORMAL LOW (ref 4.20–5.80)
RDW: 13 % (ref 11.0–15.0)
WBC: 4.1 10*3/uL (ref 3.8–10.8)

## 2020-06-21 LAB — COMPLETE METABOLIC PANEL WITH GFR
AG Ratio: 1.4 (calc) (ref 1.0–2.5)
ALT: 11 U/L (ref 9–46)
AST: 13 U/L (ref 10–35)
Albumin: 4 g/dL (ref 3.6–5.1)
Alkaline phosphatase (APISO): 38 U/L (ref 35–144)
BUN/Creatinine Ratio: 10 (calc) (ref 6–22)
BUN: 12 mg/dL (ref 7–25)
CO2: 29 mmol/L (ref 20–32)
Calcium: 10 mg/dL (ref 8.6–10.3)
Chloride: 107 mmol/L (ref 98–110)
Creat: 1.23 mg/dL — ABNORMAL HIGH (ref 0.70–1.18)
GFR, Est African American: 68 mL/min/{1.73_m2} (ref >=60)
GFR, Est Non African American: 58 mL/min/{1.73_m2} — ABNORMAL LOW (ref >=60)
Globulin: 2.9 g/dL (calc) (ref 1.9–3.7)
Glucose, Bld: 117 mg/dL — ABNORMAL HIGH (ref 65–99)
Potassium: 4.1 mmol/L (ref 3.5–5.3)
Sodium: 142 mmol/L (ref 135–146)
Total Bilirubin: 1.7 mg/dL — ABNORMAL HIGH (ref 0.2–1.2)
Total Protein: 6.9 g/dL (ref 6.1–8.1)

## 2020-06-21 LAB — HEMOGLOBIN A1C
Hgb A1c MFr Bld: 6.6 % of total Hgb — ABNORMAL HIGH (ref <5.7)
Mean Plasma Glucose: 143 (calc)
eAG (mmol/L): 7.9 (calc)

## 2020-06-21 LAB — VITAMIN D 25 HYDROXY (VIT D DEFICIENCY, FRACTURES): Vit D, 25-Hydroxy: 71 ng/mL (ref 30–100)

## 2020-06-21 LAB — TSH: TSH: 0.72 mIU/L (ref 0.40–4.50)

## 2020-06-21 LAB — T3, FREE: T3, Free: 2.9 pg/mL (ref 2.3–4.2)

## 2020-06-21 LAB — T4: T4, Total: 7.6 ug/dL (ref 4.9–10.5)

## 2020-06-21 LAB — PSA, TOTAL WITH REFLEX TO PSA, FREE: PSA, Total: 0.9 ng/mL (ref <=4.0)

## 2020-06-22 ENCOUNTER — Other Ambulatory Visit: Payer: Self-pay

## 2020-06-22 ENCOUNTER — Encounter: Payer: Self-pay | Admitting: Cardiology

## 2020-06-22 ENCOUNTER — Ambulatory Visit: Payer: PPO | Admitting: Cardiology

## 2020-06-22 VITALS — BP 122/72 | HR 55 | Ht 72.0 in | Wt 213.8 lb

## 2020-06-22 DIAGNOSIS — E785 Hyperlipidemia, unspecified: Secondary | ICD-10-CM | POA: Diagnosis not present

## 2020-06-22 DIAGNOSIS — I10 Essential (primary) hypertension: Secondary | ICD-10-CM | POA: Diagnosis not present

## 2020-06-22 DIAGNOSIS — I739 Peripheral vascular disease, unspecified: Secondary | ICD-10-CM | POA: Diagnosis not present

## 2020-06-22 DIAGNOSIS — I208 Other forms of angina pectoris: Secondary | ICD-10-CM | POA: Diagnosis not present

## 2020-06-22 DIAGNOSIS — Z9861 Coronary angioplasty status: Secondary | ICD-10-CM | POA: Diagnosis not present

## 2020-06-22 DIAGNOSIS — R079 Chest pain, unspecified: Secondary | ICD-10-CM

## 2020-06-22 DIAGNOSIS — I2510 Atherosclerotic heart disease of native coronary artery without angina pectoris: Secondary | ICD-10-CM | POA: Diagnosis not present

## 2020-06-22 NOTE — Progress Notes (Signed)
Primary Care Provider: [name], MD Cardiologist: Ricky Burow, MD Electrophysiologist: None  Clinic Note: Chief Complaint  Patient presents with  . Chest Pain    Started 1 week ago.    HPI:    [name] is a 74 y.o. male with a PMH notable for CAD (PCI to OM1 and eventually unsuccessful revascularization of OM1 in January 2019), along with PAD (bilateral SFA stenting) who is normally followed by Dr. Gwenlyn Lucas. He presents today for evaluation of intermittent chest pain over the last couple weeks..  Problem List Items Addressed This Visit    Peripheral vascular disease ([place]) (Chronic)    He does have left calf claudication and has had PTA of the left SFA back in 2006. He has routine surveillance Dopplers ordered for early 2022.  Can follow-up with Dr. Gwenlyn Lucas.      CAD S/P percutaneous coronary angioplasty - Primary (Chronic)    Stented OM1 in January 2012.  By the end of that year has been treated with day in July followed by DES PCI December 2012.  Did fine until January 2019 when he presented with a non-STEMI and was Lucas to have an occluded stent.  After initially crossing the lesion and performing PTCA, Dr. Gwenlyn Lucas was unable to recross the lesion after pulling the wire back to evaluate flow.  Therefore medical management ever since.  He has been doing relatively well with no further symptoms until last week.  Now he is having chest discomfort which sounds more consistent with when he had his first PTCA done back in July 2012.  Plan:  We will check for ischemic chest pain with Lexiscan Myoview  Continue 60 mg Imdur along with Ranexa and Toprol.  He is on aspirin plus Plavix with no bleeding issues. ->    At this point, unless Myoview shows any abnormalities, would probably be okay to hold both for procedures-5 days preop.  Now on Repatha having had statin discontinued secondary to elevated CK levels and myalgias.      Relevant Orders   EKG 12-Lead   Troponin  T   Brain natriuretic peptide (Completed)   Hyperlipidemia with target LDL less than 70 (Chronic)    Was just seen by [name], RPH-CCP in the [facility] lipid clinic.  Was started on Repatha.  Should be due to have labs checked next month.      Essential hypertension (Chronic)    Stable blood pressure on combination of lisinopril and Toprol at goal to low doses along with Imdur.      Chest pain    Now having recurrent chest pain, a little bit concerned about continuing walking.  It does sound more consistent with GERD, however he does have known CAD.  Most of it is related to the OM branch that is now occluded.  Plan: We will check Lexiscan Myoview to exclude new ischemic CAD. He is on Protonix (to avoid interaction with Plavix), and I wonder if he may require change to a different PPI.      Relevant Orders   EKG 12-Lead   Troponin T   Brain natriuretic peptide (Completed)   EKG 12-Lead   Troponin T   Brain natriuretic peptide (Completed)   MYOCARDIAL PERFUSION IMAGING   Atypical angina (HCC)    The discomfort he is describing does not sound consistent with his prior MI related angina, but does sound more like when he first came in with in-stent restenosis back in 2012.  That  was a long time ago and is not completely sure..  As such, need to exclude ischemia.  Plan: Lexiscan Myoview.  We will check BNP level just to exclude recent ACS.      Relevant Orders   EKG 12-Lead   Troponin T   Brain natriuretic peptide (Completed)     [name] was last seen on March 23, 2020 by [name], [place]. He noted that after adjustment of medications over the past year, his anginal symptoms have resolved. At that time was able to walk 2 to 3 miles 5 days a week without chest discomfort. Also playing golf. -> He had elevated CK levels of 600 in the setting of documented bilateral thigh pain. He was subsequently taken off his Crestor. Now no longer on statin, is on  Repatha.  Recent Hospitalizations: N/A  Reviewed  CV studies:    The following studies were reviewed today: (if available, images/films reviewed: From Epic Chart or Care Everywhere) . TTE September 01, 2017: Normal LV size and function.  EF 66 5%.  Normal wall motion.  GR 1 DD.  Mild aortic sclerosis.  Aortic root mildly dilated at 40 mm. Marland Kitchen LHC-PCI of OM1 August 28, 2010: (Non-STEMI): 90% proximal OM1 -> BMS PCI with 2.0 mm x 15 mm Mini Vision BMS postdilated 2.3 mm. Marland Kitchen LHC September 11, 2010: Relook cath for chest pain after recent PCI. LAD with 2 Diags. D2>D1 myocardial bridging noted after D2.  LCx -multiple large OM branches.  OM1 stent widely patent with small branch pinched.  Remainder the LCx has mild disease.  Large OM 2 is branching.  Free of disease.  AV groove circumflex gives rise to almost codominant posterolateral branch.  Moderate caliber RCA-codominant with minimal disease.   Marland Kitchen LHC-PTCA 02/10/2011: PTCA of 99% ISR OM1 . LHC-DES PCI 07/17/2011: in-stent restenosis - re-stented with Promus 2.25x11mm DES (Dr. Roni Lucas) . LHC-unsuccessful on PCI 08/30/2017:Ost OM1 stent 100% ISR. Normal LV function. EF 50-55%.    Interval History:   [name] presents here today with complaint of off-and-on chest pain beginning last week.  He was doing very well, in his usual state of health until about a week ago when he started having off-and-on discomfort in his chest.  He described it as kind of his left upper chest, but also in the center just below his throat.  He said the symptom felt more consistent with his GERD and not his MI equivalent.  His MI equivalent pain was heavy substernal pressure that was unrelenting.  This was a more burning type uneasy sensation more to the left upper chest.  Usually this is improved with his current medication, but has not subsided.  He was walking his 2 to 3 mile walk every morning without any difficulty over the last week.  He has not been doing it since he had  this episode of chest discomfort.  He said the first episode was short-lived sharp discomfort in his chest that lasted about for 79minutes.  It went away with rest.  The second 2 episodes occurred on day 2 and 3 from the initial episode and they both were improved with nitroglycerin.  Neither episode lasted more than 5 minutes.  The test discomfort is not associated with dyspnea.  He has had maybe 2 episodes in the past couple days, neither episode is increased or exacerbated by exertion.  They usually happen at rest.  Not associated with nausea or diaphoresis.  He does have occasional  off-and-on skipped beats but no prolonged irregular heartbeats.  No heart failure symptoms of PND, orthopnea or edema.  CV Review of Symptoms (Summary): positive for - chest pain and Rare skipped beats; left leg claudication in the calf. negative for - dyspnea on exertion, edema, orthopnea, paroxysmal nocturnal dyspnea, rapid heart rate, shortness of breath or Syncope/near syncope, TIA/amaurosis fugax  The patient does not have symptoms concerning for COVID-19 infection (fever, chills, cough, or new shortness of breath).   REVIEWED OF SYSTEMS   Review of Systems  Constitutional: Negative for malaise/fatigue and weight loss.  HENT: Negative for congestion and nosebleeds.   Respiratory: Negative for shortness of breath.   Cardiovascular: Positive for claudication.  Gastrointestinal: Positive for heartburn. Negative for blood in stool and melena.  Genitourinary: Negative for hematuria.  Musculoskeletal: Negative for falls and joint pain.  Neurological: Negative.  Negative for focal weakness and weakness.  Psychiatric/Behavioral: Negative.    I have reviewed and (if needed) personally updated the patient's problem list, medications, allergies, past medical and surgical history, social and family history.   PAST MEDICAL HISTORY   Past Medical History:  Diagnosis Date  . CHF (congestive heart failure) ([place])   .  Coronary artery disease 08/28/2010   s/p multiple caths 2012, BMS PCI OM1 on August 28, 2010-during NSTEMI; January 2019 non-STEMI- occlusion of OM stent (very late stent thrombosis), initial wire crossed with PTCA, but unable to rewire, PCI aborted--> plan medical therapy, normal EF  . Diabetes mellitus   . GERD (gastroesophageal reflux disease)   . Hyperlipidemia   . Hypertension   . Non-STEMI (non-ST elevated myocardial infarction) [facility]) January 2012 and 2019   a) Jan 2012: 99% OM1 - BMS PCI; b) Jan 2019: Very late stent thrombosis/100% OM1 -after initially causing him for repeat PTCA restoring flow, unable to recross to place stent. - >  Medical therapy.  Marland Kitchen PVD (peripheral vascular disease) ([place])    left SFA PTA & stenting in 02/2005 (Dr. Adora Fridge)  . Vitamin D deficiency disease 05/04/2019    PAST SURGICAL HISTORY   Past Surgical History:  Procedure Laterality Date  . BIOPSY  08/02/2019   Procedure: BIOPSY;  Surgeon: Daneil Dolin, MD;  [facility];  Service: Endoscopy;;  gastric   . CARDIAC CATHETERIZATION  12/23/2004   normal L main, normal LAD, normal L Cfx, RCA with 20% hypodense lesion in first end of vessel (Dr. Adora Fridge)  . CARDIAC CATHETERIZATION  08/26/2007   no significant CAD by cath, EF 50% (Dr. Jackie Plum)  . COLONOSCOPY  12/2009   Dr. Hampton Abbot  . COLONOSCOPY N/A 01/30/2017   pancolonic diverticulosis, non-bleeding internal hemorrhoids.  . COLONOSCOPY WITH PROPOFOL N/A 09/08/2019   Procedure: COLONOSCOPY WITH PROPOFOL;  Surgeon: Daneil Dolin, MD;  [facility];  Service: Endoscopy;  Laterality: N/A;  11:15am  . CORONARY BALLOON ANGIOPLASTY N/A 08/30/2017   Procedure: CORONARY BALLOON ANGIOPLASTY;  Surgeon: [name], MD;  Location: [place] CV [facility];  Service: Cardiovascular;  100% CTO very late stent thrombosis OM1 -> initially crossed with PTCA, but then unable to recross after losing my positioning.  PTCA ABORTED.  UNSUCCESSFUL  ATTEMPT  . CORONARY BALLOON ANGIOPLASTY  02/10/2011   95% prox in-stent restenosis within OM stent - opened with cutting balloon (Dr. Corky Downs)  . CORONARY STENT INTERVENTION  07/17/2011   in-stent restenosis - re-stented with Promus 2.25x66mm DES (Dr. Roni Lucas)  . CORONARY STENT INTERVENTION  08/28/2010  NSTEMI: OM1 99% BMS PCI 2.0x48mm MiniVision BMS (Dr. Adora Fridge)  . 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 ESOPHAGOGASTRODUODENOSCOPY N/A 08/02/2019   Procedure: ESOPHAGOGASTRODUODENOSCOPY (EGD);  Surgeon: Daneil Dolin, MD;  [facility];  Service: Endoscopy;  Laterality: N/A;  8:45am  . FEMORAL ARTERY STENT  02/27/2005   L SFA stenting - [place] down SFA across lesion - predilatation with 4x4 Powerflex, stenting with 7x4 Smart, post-dilatation with 6x4 powerflex (Dr. Adora Fridge)  . LEFT HEART CATH AND CORONARY ANGIOGRAPHY N/A 08/30/2017   Procedure: LEFT HEART CATH AND CORONARY ANGIOGRAPHY;  Surgeon: [name], MD;  Location: [place] CV LAB;  Service: Cardiovascular;  100% very late stent thrombosis of Overlapped BMS-DES OM1 -> attempted PTCA  . LEFT HEART CATH AND CORONARY ANGIOGRAPHY  08/28/2010   NSTEMI: OM1 99% BMS PCI  (Dr. Adora Fridge)  . LEFT HEART CATH AND CORONARY ANGIOGRAPHY  09/11/2010   patent stent (Dr. Roni Lucas)  . LEFT HEART CATH AND CORONARY ANGIOGRAPHY  02/10/2011   95% prox in-stent restenosis within OM stent  (Dr. Corky Downs)  . LEFT HEART CATHETERIZATION WITH CORONARY ANGIOGRAM N/A 07/17/2011   Procedure: LEFT HEART CATHETERIZATION WITH CORONARY ANGIOGRAM;  Surgeon: Leonie Man, MD;  Location: [facility] CATH LAB;  Service: Cardiovascular;  Laterality: N/A;  Right radial approach;  90% ISR of BMS (5 months post PTCA for ISR) --> DES PCI  . left knee arthroscopy  05/2016  . NM MYOCAR PERF WALL MOTION  09/08/2013   abnormal lexiscan - low to  intermediate risk;   . POLYPECTOMY  09/08/2019   Procedure: POLYPECTOMY;  Surgeon: Daneil Dolin, MD;  [facility];  Service: Endoscopy;;  . TOOTH EXTRACTION Right 06/19/2020  . TRANSTHORACIC ECHOCARDIOGRAM  09/01/2017   Normal LV size and function.  EF 66 5%.  Normal wall motion.  GR 1 DD.  Mild aortic sclerosis.  Aortic root mildly dilated at 40 mm.    Immunization History  Administered Date(s) Administered  . Fluad Quad(high Dose 65+) 05/26/2019, 06/20/2020  . Moderna SARS-COVID-2 Vaccination 09/23/2019, 10/21/2019  . Tdap 06/20/2020    MEDICATIONS/ALLERGIES   Current Meds  Medication Sig  . acetaminophen (TYLENOL) 500 MG tablet Take 1,000 mg by mouth as needed for moderate pain.   Marland Kitchen aspirin 81 MG chewable tablet Chew 1 tablet (81 mg total) by mouth daily.  . Cholecalciferol (VITAMIN D3) 50 MCG (2000 UT) TABS Take 3 tablets by mouth daily.  . clopidogrel (PLAVIX) 75 MG tablet TAKE ONE TABLET BY MOUTH ONCE DAILY.  Marland Kitchen Coenzyme Q10 (CO Q-10) 400 MG CAPS Take 400 mg by mouth daily.  Marland Kitchen dexlansoprazole (DEXILANT) 60 MG capsule Take 60 mg by mouth daily.  Marland Kitchen dextromethorphan-guaiFENesin (MUCINEX DM) 30-600 MG 12hr tablet Take 1 tablet by mouth as needed.   . Evolocumab (REPATHA SURECLICK) 629 MG/ML SOAJ Inject 140 mg into the skin every 14 (fourteen) days.  . fexofenadine (ALLEGRA) 180 MG tablet Take 180 mg by mouth as needed for allergies or rhinitis. Rotates with Claritin.  Marland Kitchen glipiZIDE-metformin (METAGLIP) 5-500 MG tablet TAKE (1) TABLET BY MOUTH ONCE DAILY.  . isosorbide mononitrate (IMDUR) 60 MG 24 hr tablet Take 1 tablet (60 mg total) by mouth daily.  Marland Kitchen lisinopril (PRINIVIL,ZESTRIL) 5 MG tablet Take 5 mg by mouth daily.  . metoprolol succinate (TOPROL-XL) 25 MG 24 hr tablet TAKE 2 TABLETS BY MOUTH ONCE DAILY.  Marland Kitchen  nitroGLYCERIN (NITROSTAT) 0.4 MG SL tablet DISSOLVE 1 TABLET UNDER TONGUE EVERY 5 MINUTES UP TO 15 MIN FOR CHEST PAIN. IF NO RELIEF CALL 911.  Glory Rosebush ULTRA  test strip USE AS DIRECTED UP TO 3 TIMES DAILY IF NEEDED.  Marland Kitchen pantoprazole (PROTONIX) 40 MG tablet Take 40 mg by mouth as needed.   . ranolazine (RANEXA) 500 MG 12 hr tablet Take 500 mg by mouth 2 (two) times daily.     Allergies  Allergen Reactions  . Crestor [Rosuvastatin]     myalgia  . Propoxyphene N-Acetaminophen Nausea Only  . Statins Other (See Comments)    Severe muscle cramping/aching/pain/ elevated CK  . Tape     Blisters  . Zetia [Ezetimibe]     Muscle cramping  . Zocor [Simvastatin]     myalgia  . Flomax [Tamsulosin Hcl] Rash  . Levaquin [Levofloxacin Hemihydrate] Rash  . Penicillins Rash    Broke out in rash 6 years ago, pt recently took penicillin (09/2016) and had no reaction Has patient had a PCN reaction causing immediate rash, facial/tongue/throat swelling, SOB or lightheadedness with hypotension: Yes Has patient had a PCN reaction causing severe rash involving mucus membranes or skin necrosis: Unknown Has patient had a PCN reaction that required hospitalization: No Has patient had a PCN reaction occurring within the last 10 years: Yes If all of the above answers are "NO", then m    SOCIAL HISTORY/FAMILY HISTORY   Reviewed in Epic:  Pertinent findings: Up until chest discomfort began, he was walking 2 miles a day without any significant episodes.  He does occasionally have to slow down but rarely stop because of his left calf pain.  OBJCTIVE -PE, EKG, labs   Wt Readings from Last 3 Encounters:  06/22/20 213 lb 12.8 oz (97 kg)  06/20/20 211 lb 9.6 oz (96 kg)  04/26/20 213 lb 9.6 oz (96.9 kg)    Physical Exam: BP 122/72   Pulse (!) 55   Ht 6' (1.829 m)   Wt 213 lb 12.8 oz (97 kg)   BMI 29.00 kg/m  Physical Exam Constitutional:      General: He is not in acute distress.    Appearance: Normal appearance. He is not ill-appearing or toxic-appearing.     Comments: Well-groomed.  Healthy-appearing.  Overweight but not obese.  HENT:     Head:  Normocephalic and atraumatic.  Neck:     Vascular: No carotid bruit, hepatojugular reflux or JVD.  Cardiovascular:     Rate and Rhythm: Normal rate and regular rhythm.  No extrasystoles are present.    Chest Wall: PMI is not displaced.     Pulses: Decreased pulses (Mildly decreased pedal pulses, palpable.).     Heart sounds: Murmur (Cannot exclude soft 1/6 SEM at RUSB.) heard.  No friction rub. No gallop.   Pulmonary:     Effort: Pulmonary effort is normal. No respiratory distress.     Breath sounds: Normal breath sounds.  Chest:     Chest wall: No tenderness.  Abdominal:     General: Bowel sounds are normal. There is no distension.     Palpations: Abdomen is soft. There is no mass.     Tenderness: There is no abdominal tenderness.     Comments: No HSM  Musculoskeletal:        General: No swelling. Normal range of motion.     Cervical back: Normal range of motion and neck supple.  Skin:    General: Skin is warm  and dry.  Neurological:     General: No focal deficit present.     Mental Status: He is alert and oriented to person, place, and time. Mental status is at baseline.     Cranial Nerves: No cranial nerve deficit.  Psychiatric:        Mood and Affect: Mood normal.        Behavior: Behavior normal.        Thought Content: Thought content normal.        Judgment: Judgment normal.     Adult ECG Report  Rate: 55 ;  Rhythm: sinus bradycardia and Otherwise normal axis, intervals and durations.;   Narrative Interpretation: Noted normal EKG.  Recent Labs:    Lab Results  Component Value Date   CHOL 180 04/20/2020   HDL 37 (L) 04/20/2020   LDLCALC 110 (H) 04/20/2020   LDLDIRECT 66 04/13/2013   TRIG 188 (H) 04/20/2020   CHOLHDL 4.9 04/20/2020   Lab Results  Component Value Date   CREATININE 1.23 (H) 06/20/2020   BUN 12 06/20/2020   NA 142 06/20/2020   K 4.1 06/20/2020   CL 107 06/20/2020   CO2 29 06/20/2020   Lab Results  Component Value Date   TSH 0.72  06/20/2020    ASSESSMENT/PLAN   74 year old gentleman with PAD and CAD followed by Dr. Alvester Chou.  Presenting here with recurrent chest pain at rest.  Has been in his usual state of health until about a week ago.  Symptoms seem similar to prior ACS presentation, but not similar to his last non-STEMI pain.  Plan: Lexiscan Myoview to evaluate for ischemia..  Problem List Items Addressed This Visit    Peripheral vascular disease ([place]) (Chronic)    He does have left calf claudication and has had PTA of the left SFA back in 2006. He has routine surveillance Dopplers ordered for early 2022.  Can follow-up with Dr. Gwenlyn Lucas.      CAD S/P percutaneous coronary angioplasty - Primary (Chronic)    Stented OM1 in January 2012.  By the end of that year has been treated with day in July followed by DES PCI December 2012.  Did fine until January 2019 when he presented with a non-STEMI and was Lucas to have an occluded stent.  After initially crossing the lesion and performing PTCA, Dr. Gwenlyn Lucas was unable to recross the lesion after pulling the wire back to evaluate flow.  Therefore medical management ever since.  He has been doing relatively well with no further symptoms until last week.  Now he is having chest discomfort which sounds more consistent with when he had his first PTCA done back in July 2012.  Plan:  We will check for ischemic chest pain with Lexiscan Myoview  Continue 60 mg Imdur along with Ranexa and Toprol.  He is on aspirin plus Plavix with no bleeding issues. ->    At this point, unless Myoview shows any abnormalities, would probably be okay to hold both for procedures-5 days preop.  Now on Repatha having had statin discontinued secondary to elevated CK levels and myalgias.      Relevant Orders   EKG 12-Lead   Troponin T   Brain natriuretic peptide (Completed)   Hyperlipidemia with target LDL less than 70 (Chronic)    Was just seen by [name], RPH-CCP in the  [facility] lipid clinic.  Was started on Repatha.  Should be due to have labs checked next month.      Essential  hypertension (Chronic)    Stable blood pressure on combination of lisinopril and Toprol at goal to low doses along with Imdur.      Chest pain    Now having recurrent chest pain, a little bit concerned about continuing walking.  It does sound more consistent with GERD, however he does have known CAD.  Most of it is related to the OM branch that is now occluded.  Plan: We will check Lexiscan Myoview to exclude new ischemic CAD. He is on Protonix (to avoid interaction with Plavix), and I wonder if he may require change to a different PPI.      Relevant Orders   EKG 12-Lead   Troponin T   Brain natriuretic peptide (Completed)   EKG 12-Lead   Troponin T   Brain natriuretic peptide (Completed)   MYOCARDIAL PERFUSION IMAGING   Atypical angina (HCC)    The discomfort he is describing does not sound consistent with his prior MI related angina, but does sound more like when he first came in with in-stent restenosis back in 2012.  That was a long time ago and is not completely sure..  As such, need to exclude ischemia.  Plan: Lexiscan Myoview.  We will check BNP level just to exclude recent ACS.      Relevant Orders   EKG 12-Lead   Troponin T   Brain natriuretic peptide (Completed)       COVID-19 Education: The signs and symptoms of COVID-19 were discussed with the patient and how to seek care for testing (follow up with PCP or arrange E-visit).   The importance of social distancing and COVID-19 vaccination was discussed today.  The patient is practicing social distancing & Masking.   I spent a total of 26 minutes with the patient spent in direct patient consultation.  Additional time spent with chart review  / charting (studies, outside notes, etc): 16 Total Time: 42 min  Current medicines are reviewed at length with the patient today.  (+/- concerns)  n/a  This visit occurred during the SARS-CoV-2 public health emergency.  Safety protocols were in place, including screening questions prior to the visit, additional usage of staff PPE, and extensive cleaning of exam room while observing appropriate contact time as indicated for disinfecting solutions.  Notice: This dictation was prepared with Dragon dictation along with smaller phrase technology. Any transcriptional errors that result from this process are unintentional and may not be corrected upon review.  Patient Instructions / Medication Changes & Studies & Tests Ordered   Patient Instructions  Medication Instructions:    no changes  *If you need a refill on your cardiac medications before your next appointment, please call your pharmacy*   Lab Work: Trop  BNP  today   If you have labs (blood work) drawn today and your tests are completely normal, you will receive your results only by: Marland Kitchen MyChart Message (if you have MyChart) OR . A paper copy in the mail If you have any lab test that is abnormal or we need to change your treatment, we will call you to review the results.   Testing/Procedures: .Will be schedule at [facility] has requested that you have a lexiscan myoview. Please follow instruction sheet, as given.   Follow-Up: At [facility], you and your health needs are our priority.  As part of our continuing mission to provide you with exceptional heart care, we have created designated Provider Care Teams.  These Care Teams  include your primary Cardiologist (physician) and Advanced Practice Providers (APPs -  Physician Assistants and Nurse Practitioners) who all work together to provide you with the care you need, when you need it.     Your next appointment:   2 to 4  week(s)  The format for your next appointment:   In Person  Provider:   You may see Ricky Burow, MD or one of the following Advanced Practice Providers on your  designated Care Team:    Ricky Ransom, PA-C  [place], [place]  Coletta Memos, [place]    Other Instructions  Studies Ordered:   Orders Placed This Encounter  Procedures  . Troponin T  . Brain natriuretic peptide  . MYOCARDIAL PERFUSION IMAGING  . EKG 12-Lead     Glenetta Hew, M.D., M.S. Interventional Cardiologist   Pager # [phone] Phone # [phone] [address] [place], [place] [address]   Thank you for choosing Heartcare at [facility]!!

## 2020-06-22 NOTE — Patient Instructions (Addendum)
Medication Instructions:    no changes  *If you need a refill on your cardiac medications before your next appointment, please call your pharmacy*   Lab Work: Trop  BNP  today   If you have labs (blood work) drawn today and your tests are completely normal, you will receive your results only by: Marland Kitchen MyChart Message (if you have MyChart) OR . A paper copy in the mail If you have any lab test that is abnormal or we need to change your treatment, we will call you to review the results.   Testing/Procedures: .Will be schedule at [place] has requested that you have a lexiscan myoview. Please follow instruction sheet, as given.   Follow-Up: At [facility], you and your health needs are our priority.  As part of our continuing mission to provide you with exceptional heart care, we have created designated Provider Care Teams.  These Care Teams include your primary Cardiologist (physician) and Advanced Practice Providers (APPs -  Physician Assistants and Nurse Practitioners) who all work together to provide you with the care you need, when you need it.     Your next appointment:   2 to 4  week(s)  The format for your next appointment:   In Person  Provider:   You may see Quay Burow, MD or one of the following Advanced Practice Providers on your designated Care Team:    Kerin Ransom, PA-C  [place], [place]  Coletta Memos, [place]    Other Instructions

## 2020-06-23 LAB — BRAIN NATRIURETIC PEPTIDE: BNP: 44.8 pg/mL (ref 0.0–100.0)

## 2020-06-24 ENCOUNTER — Encounter: Payer: Self-pay | Admitting: Cardiology

## 2020-06-24 NOTE — Assessment & Plan Note (Signed)
Stented OM1 in January 2012.  By the end of that year has been treated with day in July followed by DES PCI December 2012.  Did fine until January 2019 when he presented with a non-STEMI and was found to have an occluded stent.  After initially crossing the lesion and performing PTCA, Dr. Gwenlyn Found was unable to recross the lesion after pulling the wire back to evaluate flow.  Therefore medical management ever since.  He has been doing relatively well with no further symptoms until last week.  Now he is having chest discomfort which sounds more consistent with when he had his first PTCA done back in July 2012.  Plan:  We will check for ischemic chest pain with Lexiscan Myoview  Continue 60 mg Imdur along with Ranexa and Toprol.  He is on aspirin plus Plavix with no bleeding issues. ->    At this point, unless Myoview shows any abnormalities, would probably be okay to hold both for procedures-5 days preop.  Now on Repatha having had statin discontinued secondary to elevated CK levels and myalgias.

## 2020-06-24 NOTE — Assessment & Plan Note (Signed)
Stable blood pressure on combination of lisinopril and Toprol at goal to low doses along with Imdur.

## 2020-06-24 NOTE — Assessment & Plan Note (Addendum)
Now having recurrent chest pain, a little bit concerned about continuing walking.  It does sound more consistent with GERD, however he does have known CAD.  Most of it is related to the OM branch that is now occluded.  Plan: We will check Lexiscan Myoview to exclude new ischemic CAD. He is on Protonix (to avoid interaction with Plavix), and I wonder if he may require change to a different PPI.

## 2020-06-24 NOTE — Assessment & Plan Note (Signed)
Was just seen by Raquel Rodriguez-Guzman, RPH-CCP in the [facility] lipid clinic.  Was started on Repatha.  Should be due to have labs checked next month.

## 2020-06-24 NOTE — Assessment & Plan Note (Signed)
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 He has routine surveillance Dopplers ordered for early 2022.  Can follow-up with Dr. Gwenlyn Found.

## 2020-06-24 NOTE — Assessment & Plan Note (Addendum)
The discomfort he is describing does not sound consistent with his prior MI related angina, but does sound more like when he first came in with in-stent restenosis back in 2012.  That was a long time ago and is not completely sure..  As such, need to exclude ischemia.  Plan: Lexiscan Myoview.  We will check BNP level just to exclude recent ACS.

## 2020-06-27 ENCOUNTER — Telehealth (HOSPITAL_COMMUNITY): Payer: Self-pay | Admitting: *Deleted

## 2020-06-27 NOTE — Telephone Encounter (Signed)
Close encounter 

## 2020-06-28 ENCOUNTER — Ambulatory Visit (HOSPITAL_COMMUNITY)
Admission: RE | Admit: 2020-06-28 | Discharge: 2020-06-28 | Disposition: A | Discharge status: Home / Self Care | Payer: PPO | Source: Ambulatory Visit | Attending: Cardiovascular Disease | Admitting: Cardiovascular Disease

## 2020-06-28 DIAGNOSIS — R079 Chest pain, unspecified: Secondary | ICD-10-CM | POA: Diagnosis not present

## 2020-06-28 LAB — MYOCARDIAL PERFUSION IMAGING
LV dias vol: 135 mL (ref 62–150)
LV sys vol: 70 mL
Peak HR: 83 {beats}/min
Rest HR: 53 {beats}/min
SDS: 3
SRS: 1
SSS: 4
TID: 1.04

## 2020-06-28 MED ORDER — REGADENOSON 0.4 MG/5ML IV SOLN
0.4000 mg | Freq: Once | INTRAVENOUS | Status: AC
  Administered 2020-06-28: 0.4 mg via INTRAVENOUS

## 2020-06-28 MED ORDER — TECHNETIUM TC 99M TETROFOSMIN IV KIT
29.6000 | PACK | Freq: Once | INTRAVENOUS | Status: AC | PRN
  Administered 2020-06-28: 29.6 via INTRAVENOUS
  Filled 2020-06-28: qty 30

## 2020-06-28 MED ORDER — TECHNETIUM TC 99M TETROFOSMIN IV KIT
9.4000 | PACK | Freq: Once | INTRAVENOUS | Status: AC | PRN
  Administered 2020-06-28: 9.4 via INTRAVENOUS
  Filled 2020-06-28: qty 10

## 2020-07-04 DIAGNOSIS — Z79899 Other long term (current) drug therapy: Secondary | ICD-10-CM | POA: Diagnosis not present

## 2020-07-04 DIAGNOSIS — E785 Hyperlipidemia, unspecified: Secondary | ICD-10-CM | POA: Diagnosis not present

## 2020-07-05 LAB — LIPID PANEL
Chol/HDL Ratio: 3.2 ratio (ref 0.0–5.0)
Cholesterol, Total: 131 mg/dL (ref 100–199)
HDL: 41 mg/dL (ref >39)
LDL Chol Calc (NIH): 65 mg/dL (ref 0–99)
Triglycerides: 143 mg/dL (ref 0–149)
VLDL Cholesterol Cal: 25 mg/dL (ref 5–40)

## 2020-07-05 LAB — CK: Total CK: 187 U/L (ref 41–331)

## 2020-07-11 ENCOUNTER — Encounter: Payer: Self-pay | Admitting: Internal Medicine

## 2020-07-11 ENCOUNTER — Telehealth (INDEPENDENT_AMBULATORY_CARE_PROVIDER_SITE_OTHER): Payer: PPO | Admitting: Internal Medicine

## 2020-07-11 ENCOUNTER — Telehealth: Payer: Self-pay | Admitting: Cardiology

## 2020-07-11 VITALS — BP 126/69 | HR 50 | Ht 72.0 in | Wt 204.0 lb

## 2020-07-11 DIAGNOSIS — I739 Peripheral vascular disease, unspecified: Secondary | ICD-10-CM | POA: Diagnosis not present

## 2020-07-11 DIAGNOSIS — T466X5A Adverse effect of antihyperlipidemic and antiarteriosclerotic drugs, initial encounter: Secondary | ICD-10-CM | POA: Diagnosis not present

## 2020-07-11 DIAGNOSIS — E785 Hyperlipidemia, unspecified: Secondary | ICD-10-CM

## 2020-07-11 DIAGNOSIS — I208 Other forms of angina pectoris: Secondary | ICD-10-CM | POA: Diagnosis not present

## 2020-07-11 DIAGNOSIS — G720 Drug-induced myopathy: Secondary | ICD-10-CM | POA: Diagnosis not present

## 2020-07-11 NOTE — Addendum Note (Signed)
Addended by: Fidel Levy on: 07/11/2020 09:52 AM   Modules accepted: Orders

## 2020-07-11 NOTE — Progress Notes (Addendum)
Virtual Visit via Telephone Note   This visit type was conducted due to national recommendations for restrictions regarding the COVID-19 Pandemic (e.g. social distancing) in an effort to limit this patient's exposure and mitigate transmission in our community.  Due to his co-morbid illnesses, this patient is at least at moderate risk for complications without adequate follow up.  This format is felt to be most appropriate for this patient at this time.  The patient did not have access to video technology/had technical difficulties with video requiring transitioning to audio format only (telephone).  All issues noted in this document were discussed and addressed.  No physical exam could be performed with this format.  Please refer to the patient's chart for his  consent to telehealth for [facility].   Date:  07/11/2020   ID:  [name], DOB 11/15/1945, MRN [id number] The patient was identified using 2 identifiers.  Evaluation Performed:  Follow-Up Visit  Patient Location:  [place] [address]  Provider location:   [address], [place], [place] [address]  PCP:  [name], MD  Cardiologist:  Quay Burow, MD Electrophysiologist:  None   Chief Complaint:  Possible Repatha side effects  History of Present Illness:    [name] is a 74 y.o. male who presents via audio/video conferencing for a telehealth visit today.  This is a 74 year old male with a history of coronary artery disease and PAD with mixed dyslipidemia and a history of multi drug intolerance.  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  His CK was as high as 616 - 3 months ago and he was taken off statins.  He was then referred to the lipid clinic and seen by Racquel Rodriguez-Guzman, Pharm.D. and felt to be appropriate for initiation of Repatha.  Apparently this is not approved through the [place].  I did receive records  today from a consultation with the pharmacist on June 05, 2020 which indicates that the [place] did approve Praluent 75 mg every 2 weeks.  He has now taken 3 doses of the medication.  Initially said he felt like he had some side effects including chest pain, shortness of breath, fatigue, weakness, etc.  Subsequently other doses seem to be improved somewhat.  He did have repeat lipids performed on November 24 which showed further reduction in his cholesterol and actually a very good response.  His total cholesterol now is 131, triglycerides 143, HDL 41 and LDL of 65.  At this point his lipids are at target across the board.  I did reassure him actually that I feel that his symptoms are unlikely to be due to the medication.  Predominantly because they are different from the symptoms he was having with the statin and seem to be more persistent not just within a few days of the dosing which is typical of side effects related to every 2-week dosing with a PCSK9 inhibitor.  The patient does not have symptoms concerning for COVID-19 infection (fever, chills, cough, or new SHORTNESS OF BREATH).    Prior CV studies:   The following studies were reviewed today:  Chart reviewed  PMHx:  Past Medical History:  Diagnosis Date  . CHF (congestive heart failure) ([place])   . Coronary artery disease 08/28/2010   s/p multiple caths 2012, BMS PCI OM1 on August 28, 2010-during NSTEMI; January 2019 non-STEMI- occlusion of OM stent (very late stent thrombosis), initial wire crossed  with PTCA, but unable to rewire, PCI aborted--> plan medical therapy, normal EF  . Diabetes mellitus   . GERD (gastroesophageal reflux disease)   . Hyperlipidemia   . Hypertension   . Non-STEMI (non-ST elevated myocardial infarction) [facility]) January 2012 and 2019   a) Jan 2012: 99% OM1 - BMS PCI; b) Jan 2019: Very late stent thrombosis/100% OM1 -after initially causing him for repeat PTCA restoring flow, unable to recross to place stent. - >   Medical therapy.  Marland Kitchen PVD (peripheral vascular disease) ([place])    left SFA PTA & stenting in 02/2005 (Dr. Adora Fridge)  . Vitamin D deficiency disease 05/04/2019    Past Surgical History:  Procedure Laterality Date  . BIOPSY  08/02/2019   Procedure: BIOPSY;  Surgeon: Daneil Dolin, MD;  [facility];  Service: Endoscopy;;  gastric   . CARDIAC CATHETERIZATION  12/23/2004   normal L main, normal LAD, normal L Cfx, RCA with 20% hypodense lesion in first end of vessel (Dr. Adora Fridge)  . CARDIAC CATHETERIZATION  08/26/2007   no significant CAD by cath, EF 50% (Dr. Jackie Plum)  . COLONOSCOPY  12/2009   Dr. Hampton Abbot  . COLONOSCOPY N/A 01/30/2017   pancolonic diverticulosis, non-bleeding internal hemorrhoids.  . COLONOSCOPY WITH PROPOFOL N/A 09/08/2019   Procedure: COLONOSCOPY WITH PROPOFOL;  Surgeon: Daneil Dolin, MD;  [facility];  Service: Endoscopy;  Laterality: N/A;  11:15am  . CORONARY BALLOON ANGIOPLASTY N/A 08/30/2017   Procedure: CORONARY BALLOON ANGIOPLASTY;  Surgeon: [name], MD;  Location: [place] CV LAB;  Service: Cardiovascular;  100% CTO very late stent thrombosis OM1 -> initially crossed with PTCA, but then unable to recross after losing my positioning.  PTCA ABORTED.  UNSUCCESSFUL ATTEMPT  . CORONARY BALLOON ANGIOPLASTY  02/10/2011   95% prox in-stent restenosis within OM stent - opened with cutting balloon (Dr. Corky Downs)  . CORONARY STENT INTERVENTION  07/17/2011   in-stent restenosis - re-stented with Promus 2.25x56mm DES (Dr. Roni Bread)  . CORONARY STENT INTERVENTION  08/28/2010   NSTEMI: OM1 99% BMS PCI 2.0x18mm MiniVision BMS (Dr. Adora Fridge)  . 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 ESOPHAGOGASTRODUODENOSCOPY N/A 08/02/2019   Procedure: ESOPHAGOGASTRODUODENOSCOPY (EGD);  Surgeon: Daneil Dolin, MD;  [facility];  Service: Endoscopy;  Laterality: N/A;  8:45am  . FEMORAL ARTERY STENT  02/27/2005   L SFA stenting - [place] down SFA across lesion - predilatation with 4x4 Powerflex, stenting with 7x4 Smart, post-dilatation with 6x4 powerflex (Dr. Adora Fridge)  . LEFT HEART CATH AND CORONARY ANGIOGRAPHY N/A 08/30/2017   Procedure: LEFT HEART CATH AND CORONARY ANGIOGRAPHY;  Surgeon: [name], MD;  Location: [place] CV LAB;  Service: Cardiovascular;  100% very late stent thrombosis of Overlapped BMS-DES OM1 -> attempted PTCA  . LEFT HEART CATH AND CORONARY ANGIOGRAPHY  08/28/2010   NSTEMI: OM1 99% BMS PCI  (Dr. Adora Fridge)  . LEFT HEART CATH AND CORONARY ANGIOGRAPHY  09/11/2010   patent stent (Dr. Roni Bread)  . LEFT HEART CATH AND CORONARY ANGIOGRAPHY  02/10/2011   95% prox in-stent restenosis within OM stent  (Dr. Corky Downs)  . LEFT HEART CATHETERIZATION WITH CORONARY ANGIOGRAM N/A 07/17/2011   Procedure: LEFT HEART CATHETERIZATION WITH CORONARY ANGIOGRAM;  Surgeon: Leonie Man, MD;  Location: [facility] CATH LAB;  Service: Cardiovascular;  Laterality: N/A;  Right radial approach;  90% ISR of BMS (5 months post PTCA for ISR) --> DES PCI  . left knee arthroscopy  05/2016  . NM MYOCAR PERF WALL MOTION  09/08/2013   abnormal lexiscan - low to intermediate risk;   . POLYPECTOMY  09/08/2019   Procedure: POLYPECTOMY;  Surgeon: Daneil Dolin, MD;  [facility];  Service: Endoscopy;;  . TOOTH EXTRACTION Right 06/19/2020  . TRANSTHORACIC ECHOCARDIOGRAM  09/01/2017   Normal LV size and function.  EF 66 5%.  Normal wall motion.  GR 1 DD.  Mild aortic sclerosis.  Aortic root mildly dilated at 40 mm.    FAMHx:  Family History  Problem Relation Age of Onset  . Arrhythmia Mother 30  . Colon cancer Neg Hx   . Liver disease Neg Hx   . Inflammatory bowel disease Neg Hx   . Colon polyps Neg Hx     SOCHx:   reports that he quit smoking about 21 years ago. He quit after 50.00 years of use. He has never  used smokeless tobacco. He reports that he does not drink alcohol and does not use drugs.  ALLERGIES:  Allergies  Allergen Reactions  . Crestor [Rosuvastatin]     myalgia  . Propoxyphene N-Acetaminophen Nausea Only  . Statins Other (See Comments)    Severe muscle cramping/aching/pain/ elevated CK  . Tape     Blisters  . Zetia [Ezetimibe]     Muscle cramping  . Zocor [Simvastatin]     myalgia  . Flomax [Tamsulosin Hcl] Rash  . Levaquin [Levofloxacin Hemihydrate] Rash  . Penicillins Rash    Broke out in rash 6 years ago, pt recently took penicillin (09/2016) and had no reaction Has patient had a PCN reaction causing immediate rash, facial/tongue/throat swelling, SOB or lightheadedness with hypotension: Yes Has patient had a PCN reaction causing severe rash involving mucus membranes or skin necrosis: Unknown Has patient had a PCN reaction that required hospitalization: No Has patient had a PCN reaction occurring within the last 10 years: Yes If all of the above answers are "NO", then m    MEDS:  Current Meds  Medication Sig  . acetaminophen (TYLENOL) 500 MG tablet Take 1,000 mg by mouth as needed for moderate pain.   Marland Kitchen aspirin 81 MG chewable tablet Chew 1 tablet (81 mg total) by mouth daily.  . Cholecalciferol (VITAMIN D3) 50 MCG (2000 UT) TABS Take 3 tablets by mouth daily.  . clopidogrel (PLAVIX) 75 MG tablet TAKE ONE TABLET BY MOUTH ONCE DAILY.  Marland Kitchen Coenzyme Q10 (CO Q-10) 400 MG CAPS Take 400 mg by mouth daily.  Marland Kitchen dexlansoprazole (DEXILANT) 60 MG capsule Take 60 mg by mouth daily.  Marland Kitchen dextromethorphan-guaiFENesin (MUCINEX DM) 30-600 MG 12hr tablet Take 1 tablet by mouth as needed.   . Evolocumab (REPATHA SURECLICK) 149 MG/ML SOAJ Inject 140 mg into the skin every 14 (fourteen) days.  . fexofenadine (ALLEGRA) 180 MG tablet Take 180 mg by mouth as needed for allergies or rhinitis. Rotates with Claritin.  Marland Kitchen glipiZIDE-metformin (METAGLIP) 5-500 MG tablet TAKE (1) TABLET BY MOUTH ONCE  DAILY.  . isosorbide mononitrate (IMDUR) 60 MG 24 hr tablet Take 1 tablet (60 mg total) by mouth daily.  Marland Kitchen lisinopril (PRINIVIL,ZESTRIL) 5 MG tablet Take 5 mg by mouth daily.  . metoprolol succinate (TOPROL-XL) 25 MG 24 hr tablet TAKE 2 TABLETS BY MOUTH ONCE DAILY.  . nitroGLYCERIN (NITROSTAT) 0.4 MG SL tablet DISSOLVE 1 TABLET UNDER TONGUE EVERY 5 MINUTES UP TO 15 MIN  FOR CHEST PAIN. IF NO RELIEF CALL 911.  Glory Rosebush ULTRA test strip USE AS DIRECTED UP TO 3 TIMES DAILY IF NEEDED.  Marland Kitchen pantoprazole (PROTONIX) 40 MG tablet Take 40 mg by mouth as needed.   . ranolazine (RANEXA) 500 MG 12 hr tablet Take 500 mg by mouth 2 (two) times daily.      ROS: Pertinent items noted in HPI and remainder of comprehensive ROS otherwise negative.  Labs/Other Tests and Data Reviewed:    Recent Labs: 06/20/2020: ALT 11; BUN 12; Creat 1.23; Hemoglobin 12.9; Platelets 173; Potassium 4.1; Sodium 142; TSH 0.72 06/22/2020: BNP 44.8   Recent Lipid Panel Lab Results  Component Value Date/Time   CHOL 131 07/04/2020 09:38 AM   TRIG 143 07/04/2020 09:38 AM   HDL 41 07/04/2020 09:38 AM   CHOLHDL 3.2 07/04/2020 09:38 AM   CHOLHDL 3.4 12/08/2019 10:41 AM   LDLCALC 65 07/04/2020 09:38 AM   LDLCALC 69 12/08/2019 10:41 AM   LDLDIRECT 66 04/13/2013 07:32 AM    Wt Readings from Last 3 Encounters:  07/11/20 204 lb (92.5 kg)  06/28/20 213 lb (96.6 kg)  06/22/20 213 lb 12.8 oz (97 kg)     Exam:    Vital Signs:  BP 126/69   Pulse (!) 50   Ht 6' (1.829 m)   Wt 204 lb (92.5 kg)   BMI 27.67 kg/m    Exam not performed due to telephone visit  ASSESSMENT & PLAN:    1. CAD status post PCI 2. PAD 3. Statin intolerance-myalgias/myopathy 4. History of elevated CKs 5. Essential hypertension  [name] has had an excellent response to Praluent 75 mg q2weeks. He had recent stress testing which was negative for ischemia and showed fixed defects. I'm not convinced any of his chest pain, fatigue, dyspnea, etc are  related to the Praluent. His options are otherwise very limited and his risk for recurrent PAD and CAD are high without the medicine. He has had a great clinical response to Repatha and I would encourage him to continue on it. He has follow-up with Dr. Gwenlyn Found in January. He can otherwise follow-up with our lipid clinic PharmD's.  COVID-19 Education: The signs and symptoms of COVID-19 were discussed with the patient and how to seek care for testing (follow up with PCP or arrange E-visit).  The importance of social distancing was discussed today.  Patient Risk:   After full review of this patients clinical status, I feel that they are at least moderate risk at this time.  Time:   Today, I have spent 25 minutes with the patient with telehealth technology discussing dyslipidemia and PCSK9 inhibitors.     Medication Adjustments/Labs and Tests Ordered: Current medicines are reviewed at length with the patient today.  Concerns regarding medicines are outlined above.   Tests Ordered: No orders of the defined types were placed in this encounter.   Medication Changes: No orders of the defined types were placed in this encounter.   Disposition:  prn  Pixie Casino, MD, FACC, [place] Director of the Advanced Lipid Disorders &  Cardiovascular Risk Reduction Clinic Diplomate of the American Board of Clinical Lipidology Attending Cardiologist  Direct Dial: [phone]  Fax: [phone]  Website:  [email or web address]  Pixie Casino, MD  07/11/2020 8:09 AM

## 2020-07-11 NOTE — Patient Instructions (Signed)
Medication Instructions:  Your physician recommends that you continue on your current medications as directed. Please refer to the Current Medication list given to you today.  *If you need a refill on your cardiac medications before your next appointment, please call your pharmacy*   Follow-Up: At [facility], you and your health needs are our priority.  As part of our continuing mission to provide you with exceptional heart care, we have created designated Provider Care Teams.  These Care Teams include your primary Cardiologist (physician) and Advanced Practice Providers (APPs -  Physician Assistants and Nurse Practitioners) who all work together to provide you with the care you need, when you need it.  We recommend signing up for the patient portal called "MyChart".  Sign up information is provided on this After Visit Summary.  MyChart is used to connect with patients for Virtual Visits (Telemedicine).  Patients are able to view lab/test results, encounter notes, upcoming appointments, etc.  Non-urgent messages can be sent to your provider as well.   To learn more about what you can do with MyChart, go to [email or web address].    Your next appointment:   AS NEEDED with Dr. Hilty for lipid management    

## 2020-07-11 NOTE — Telephone Encounter (Signed)
Returned call to pt and stated that pa submitted today for repatha 140 Pt voiced understanding

## 2020-07-11 NOTE — Telephone Encounter (Signed)
Pt c/o medication issue:  1. Name of Medication: Repatha 140 MG's   2. How are you currently taking this medication (dosage and times per day)? 1 shot every 14 days  3. Are you having a reaction (difficulty breathing--STAT)? No   4. What is your medication issue? [name] is calling stating he tried to pick up his refill of this medication from his pharmacy today and they advised him insurance is unable to cover it until they receive a signature from [employer]. Please advise.

## 2020-07-12 ENCOUNTER — Ambulatory Visit: Payer: PPO | Admitting: Nurse Practitioner

## 2020-07-12 ENCOUNTER — Other Ambulatory Visit: Payer: Self-pay

## 2020-07-12 ENCOUNTER — Encounter: Payer: Self-pay | Admitting: Nurse Practitioner

## 2020-07-12 VITALS — BP 135/74 | HR 56 | Temp 97.5°F | Ht 72.0 in | Wt 214.6 lb

## 2020-07-12 DIAGNOSIS — K219 Gastro-esophageal reflux disease without esophagitis: Secondary | ICD-10-CM

## 2020-07-12 DIAGNOSIS — K5900 Constipation, unspecified: Secondary | ICD-10-CM

## 2020-07-12 MED ORDER — SUCRALFATE 1 G PO TABS: 1.0000 g | ORAL_TABLET | Freq: Four times a day (QID) | ORAL | 1 refills | Status: DC | PRN

## 2020-07-12 NOTE — Patient Instructions (Addendum)
Your health issues we discussed today were:   GERD (reflux/heartburn): 1. As we discussed, I think you are just having a prolonged flare of your symptoms 2. Hopefully Dexilant will continue to work for you when you can get this under control 3. I am adding Carafate 1 g tablets.  Crush the tablet and mix with 2 to 3 tablespoons of water to make a liquid/slurry that she can drink 4. You can use Carafate up to 4 times a day as needed 5. Continue Dexilant as you have been 6. Call us for any worsening or severe symptoms  Constipation: 1. As we discussed, I think we need to start with a prescription medication 2. Start taking Colace 3. Start taking Linzess 72 mcg once a day on an empty stomach 4. I am giving you samples last 1 to 2 weeks 5. Call us in 1 to 2 weeks and let us know how it is working 6. As we discussed, you may develop diarrhea.  If so, this typically resolves within a few days on its own 7. If your diarrhea last more than a week or is intolerable can call our office and let me know 8. Call for any worsening or severe symptoms or if you see any obvious bleeding  Overall I recommend:  1. Continue your other current medications 2. Return for follow-up in 3 months 3. Call us for any questions or concerns   ---------------------------------------------------------------  I am glad you have gotten your COVID-19 vaccination!  Even though you are fully vaccinated you should continue to follow CDC and state/local guidelines.  ---------------------------------------------------------------   At [facility] we value your feedback. You may receive a survey about your visit today. Please share your experience as we strive to create trusting relationships with our patients to provide genuine, compassionate, quality care.  We appreciate your understanding and patience as we review any laboratory studies, imaging, and other diagnostic tests that are ordered as we care for  you. Our office policy is 5 business days for review of these results, and any emergent or urgent results are addressed in a timely manner for your best interest. If you do not hear from our office in 1 week, please contact us.   We also encourage the use of MyChart, which contains your medical information for your review as well. If you are not enrolled in this feature, an access code is on this after visit summary for your convenience. Thank you for allowing [place] to be involved in your care.  It was great to see you today!  I hope you have a Merry Christmas and Happy Holidays!!

## 2020-07-12 NOTE — Progress Notes (Signed)
Cc'ed to pcp °

## 2020-07-12 NOTE — Progress Notes (Signed)
Referring Provider: [name], MD Primary Care Physician:  [name], MD Primary GI:  Dr. Abbey Chatters  Chief Complaint  Patient presents with  . Constipation    colace  . Gastroesophageal Reflux    Dexilant 60 mg     HPI:   [name] is a 74 y.o. male who presents for follow-up exacerbation.  The patient last seen in our office on 01/12/2020 for GERD, IDA, constipation.  Noted history of anemia with hemoglobin approximately 12 and low ferritin at 34, iron sats low at 13 on initial referral.  Folate and B12 unremarkable.  Previous colonoscopy in 2018 with pancolonic diverticulosis nonbleeding internal hemorrhoids.  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  Updated EGD 08/02/2019 with erythematous mucosa in the stomach consistent with PPI effect.  Recommended update colonoscopy in 2021.  This was updated on 09/08/2019 and diverticulosis in the sigmoid equal ascending colon's were noted as well as a single 5 mm polyp at the ileocecal valve and cecal AVM ablated status post clipping.  The polyp was tubular adenoma and recommended no future colonoscopy unless new symptoms develop.  At his last visit noted constipation better with a bowel movement every other day consistently [facility] 4 that he feels is well managed by Colace.  No other overt GI complaints.  He alternates Dexilant for 1 to 2 months then Protonix for 1 to 2 months and then back to [place] which controls his GERD symptoms well and keeps his medications affordable.  Today states doing okay overall. He is having worsening constipation, currently on Colace two once daily. Previously working well, not working as well at this point. Having a bowel movement every 3-4 days with hard stools and straining. Has also tried Dulcolax which will move his bowel one day and then "that's it." Also with worsening GERD, tender xiphoid if he touches it. GERD was previously well managed on Dexilant,  began worsening two weeks ago; previously no issues at all for 3-4 months. Avoids triggers, denies NSAIDs, no diet changes. Has associated bloating as well. Denies dysphagia symptoms. Denies N/V, hematochezia, melena, fever, chills, unintentional weight loss. Denies URI or flu-like symptoms. Denies loss of sense of taste or smell. The patient has received COVID-19 vaccination(s). Denies chest pain, dyspnea, dizziness, lightheadedness, syncope, near syncope. Denies any other upper or lower GI symptoms.  Recent stress test normal. Switched from Statin to Repatha and cholesterol/trigs all at goal.  Past Medical History:  Diagnosis Date  . CHF (congestive heart failure) ([place])   . Coronary artery disease 08/28/2010   s/p multiple caths 2012, BMS PCI OM1 on August 28, 2010-during NSTEMI; January 2019 non-STEMI- occlusion of OM stent (very late stent thrombosis), initial wire crossed with PTCA, but unable to rewire, PCI aborted--> plan medical therapy, normal EF  . Diabetes mellitus   . GERD (gastroesophageal reflux disease)   . Hyperlipidemia   . Hypertension   . Non-STEMI (non-ST elevated myocardial infarction) [facility]) January 2012 and 2019   a) Jan 2012: 99% OM1 - BMS PCI; b) Jan 2019: Very late stent thrombosis/100% OM1 -after initially causing him for repeat PTCA restoring flow, unable to recross to place stent. - >  Medical therapy.  Marland Kitchen PVD (peripheral vascular disease) ([place])    left SFA PTA & stenting in 02/2005 (Dr. Adora Fridge)  . Vitamin D deficiency disease 05/04/2019    Past Surgical History:  Procedure Laterality Date  . BIOPSY  08/02/2019  Procedure: BIOPSY;  Surgeon: Daneil Dolin, MD;  [facility];  Service: Endoscopy;;  gastric   . CARDIAC CATHETERIZATION  12/23/2004   normal L main, normal LAD, normal L Cfx, RCA with 20% hypodense lesion in first end of vessel (Dr. Adora Fridge)  . CARDIAC CATHETERIZATION  08/26/2007   no significant CAD by cath, EF 50% (Dr. Jackie Plum)  .  COLONOSCOPY  12/2009   Dr. Hampton Abbot  . COLONOSCOPY N/A 01/30/2017   pancolonic diverticulosis, non-bleeding internal hemorrhoids.  . COLONOSCOPY WITH PROPOFOL N/A 09/08/2019   Procedure: COLONOSCOPY WITH PROPOFOL;  Surgeon: Daneil Dolin, MD;  [facility];  Service: Endoscopy;  Laterality: N/A;  11:15am  . CORONARY BALLOON ANGIOPLASTY N/A 08/30/2017   Procedure: CORONARY BALLOON ANGIOPLASTY;  Surgeon: [name], MD;  Location: [place] CV LAB;  Service: Cardiovascular;  100% CTO very late stent thrombosis OM1 -> initially crossed with PTCA, but then unable to recross after losing my positioning.  PTCA ABORTED.  UNSUCCESSFUL ATTEMPT  . CORONARY BALLOON ANGIOPLASTY  02/10/2011   95% prox in-stent restenosis within OM stent - opened with cutting balloon (Dr. Corky Downs)  . CORONARY STENT INTERVENTION  07/17/2011   in-stent restenosis - re-stented with Promus 2.25x90mm DES (Dr. Roni Bread)  . CORONARY STENT INTERVENTION  08/28/2010   NSTEMI: OM1 99% BMS PCI 2.0x20mm MiniVision BMS (Dr. Adora Fridge)  . 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 ESOPHAGOGASTRODUODENOSCOPY N/A 08/02/2019   Procedure: ESOPHAGOGASTRODUODENOSCOPY (EGD);  Surgeon: Daneil Dolin, MD;  [facility];  Service: Endoscopy;  Laterality: N/A;  8:45am  . FEMORAL ARTERY STENT  02/27/2005   L SFA stenting - [place] down SFA across lesion - predilatation with 4x4 Powerflex, stenting with 7x4 Smart, post-dilatation with 6x4 powerflex (Dr. Adora Fridge)  . LEFT HEART CATH AND CORONARY ANGIOGRAPHY N/A 08/30/2017   Procedure: LEFT HEART CATH AND CORONARY ANGIOGRAPHY;  Surgeon: [name], MD;  Location: [place] CV LAB;  Service: Cardiovascular;  100% very late stent thrombosis of Overlapped BMS-DES OM1 -> attempted PTCA  . LEFT HEART CATH AND CORONARY ANGIOGRAPHY  08/28/2010    NSTEMI: OM1 99% BMS PCI  (Dr. Adora Fridge)  . LEFT HEART CATH AND CORONARY ANGIOGRAPHY  09/11/2010   patent stent (Dr. Roni Bread)  . LEFT HEART CATH AND CORONARY ANGIOGRAPHY  02/10/2011   95% prox in-stent restenosis within OM stent  (Dr. Corky Downs)  . LEFT HEART CATHETERIZATION WITH CORONARY ANGIOGRAM N/A 07/17/2011   Procedure: LEFT HEART CATHETERIZATION WITH CORONARY ANGIOGRAM;  Surgeon: Leonie Man, MD;  Location: [facility] CATH LAB;  Service: Cardiovascular;  Laterality: N/A;  Right radial approach;  90% ISR of BMS (5 months post PTCA for ISR) --> DES PCI  . left knee arthroscopy  05/2016  . NM MYOCAR PERF WALL MOTION  09/08/2013   abnormal lexiscan - low to intermediate risk;   . POLYPECTOMY  09/08/2019   Procedure: POLYPECTOMY;  Surgeon: Daneil Dolin, MD;  [facility];  Service: Endoscopy;;  . TOOTH EXTRACTION Right 06/19/2020  . TRANSTHORACIC ECHOCARDIOGRAM  09/01/2017   Normal LV size and function.  EF 66 5%.  Normal wall motion.  GR 1 DD.  Mild aortic sclerosis.  Aortic root mildly dilated at 40 mm.    Current Outpatient Medications  Medication Sig Dispense Refill  . acetaminophen (TYLENOL) 500 MG tablet Take 1,000 mg by  mouth as needed for moderate pain.     . Alirocumab (PRALUENT) 75 MG/ML SOAJ Inject 1 Dose into the skin every 14 (fourteen) days.    Marland Kitchen aspirin 81 MG chewable tablet Chew 1 tablet (81 mg total) by mouth daily.    . Cholecalciferol (VITAMIN D3) 50 MCG (2000 UT) TABS Take 3 tablets by mouth daily.    . clopidogrel (PLAVIX) 75 MG tablet TAKE ONE TABLET BY MOUTH ONCE DAILY. 30 tablet 9  . Coenzyme Q10 (CO Q-10) 400 MG CAPS Take 400 mg by mouth daily.    Marland Kitchen dexlansoprazole (DEXILANT) 60 MG capsule Take 60 mg by mouth daily.    Marland Kitchen dextromethorphan-guaiFENesin (MUCINEX DM) 30-600 MG 12hr tablet Take 1 tablet by mouth as needed.     . fexofenadine (ALLEGRA) 180 MG tablet Take 180 mg by mouth as needed for allergies or rhinitis. Rotates with Claritin.    Marland Kitchen  glipiZIDE-metformin (METAGLIP) 5-500 MG tablet TAKE (1) TABLET BY MOUTH ONCE DAILY. 90 tablet 0  . isosorbide mononitrate (IMDUR) 60 MG 24 hr tablet Take 1 tablet (60 mg total) by mouth daily. 90 tablet 3  . lisinopril (PRINIVIL,ZESTRIL) 5 MG tablet Take 5 mg by mouth daily.    . metoprolol succinate (TOPROL-XL) 25 MG 24 hr tablet TAKE 2 TABLETS BY MOUTH ONCE DAILY. 180 tablet 0  . nitroGLYCERIN (NITROSTAT) 0.4 MG SL tablet DISSOLVE 1 TABLET UNDER TONGUE EVERY 5 MINUTES UP TO 15 MIN FOR CHEST PAIN. IF NO RELIEF CALL 911. 25 tablet 0  . ONETOUCH ULTRA test strip USE AS DIRECTED UP TO 3 TIMES DAILY IF NEEDED. 100 strip 3  . pantoprazole (PROTONIX) 40 MG tablet Take 40 mg by mouth as needed.     . ranolazine (RANEXA) 500 MG 12 hr tablet Take 500 mg by mouth 2 (two) times daily.  (Patient not taking: Reported on 07/12/2020)     No current facility-administered medications for this visit.    Allergies as of 07/12/2020 - Review Complete 07/12/2020  Allergen Reaction Noted  . Crestor [rosuvastatin]  04/26/2020  . Propoxyphene n-acetaminophen Nausea Only   . Statins Other (See Comments) 07/29/2011  . Tape  07/29/2011  . Zetia [ezetimibe]  08/30/2017  . Zocor [simvastatin]  04/26/2020  . Flomax [tamsulosin hcl] Rash 08/16/2014  . Levaquin [levofloxacin hemihydrate] Rash 07/29/2011  . Penicillins Rash     Family History  Problem Relation Age of Onset  . Arrhythmia Mother 1  . Colon cancer Neg Hx   . Liver disease Neg Hx   . Inflammatory bowel disease Neg Hx   . Colon polyps Neg Hx     Social History   Socioeconomic History  . Marital status: Married    Spouse name: Not on file  . Number of children: 3  . Years of education: Not on file  . Highest education level: Not on file  Occupational History  . Occupation: Retired    Fish farm manager: [employer]  Tobacco Use  . Smoking status: Former Smoker    Years: 50.00    Quit date: 05/12/1999    Years since quitting: 21.1  . Smokeless  tobacco: Never Used  Vaping Use  . Vaping Use: Never used  Substance and Sexual Activity  . Alcohol use: No  . Drug use: No  . Sexual activity: Never  Other Topics Concern  . 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   Social Determinants of Health  Financial Resource Strain:   . Difficulty of Paying Living Expenses: Not on file  Food Insecurity:   . Worried About Charity fundraiser in the Last Year: Not on file  . Ran Out of Food in the Last Year: Not on file  Transportation Needs:   . Lack of Transportation (Medical): Not on file  . Lack of Transportation (Non-Medical): Not on file  Physical Activity:   . Days of Exercise per Week: Not on file  . Minutes of Exercise per Session: Not on file  Stress:   . Feeling of Stress : Not on file  Social Connections:   . Frequency of Communication with Friends and Family: Not on file  . Frequency of Social Gatherings with Friends and Family: Not on file  . Attends Religious Services: Not on file  . Active Member of Clubs or Organizations: Not on file  . Attends Archivist Meetings: Not on file  . Marital Status: Not on file    Subjective: Review of Systems  Constitutional: Negative for chills, fever, malaise/fatigue and weight loss.  HENT: Negative for congestion and sore throat.   Respiratory: Negative for cough and shortness of breath.   Cardiovascular: Negative for chest pain and palpitations.  Gastrointestinal: Positive for constipation and heartburn. Negative for abdominal pain, blood in stool, diarrhea, melena, nausea and vomiting.  Musculoskeletal: Negative for joint pain and myalgias.  Skin: Negative for rash.  Neurological: Negative for dizziness and weakness.  Endo/Heme/Allergies: Does not bruise/bleed easily.  Psychiatric/Behavioral: Negative for depression. The patient is not nervous/anxious.   All other systems  reviewed and are negative.    Objective: BP 135/74   Pulse (!) 56   Temp (!) 97.5 F (36.4 C) (Temporal)   Ht 6' (1.829 m)   Wt 214 lb 9.6 oz (97.3 kg)   BMI 29.10 kg/m  Physical Exam Vitals and nursing note reviewed.  Constitutional:      General: He is not in acute distress.    Appearance: Normal appearance. He is not ill-appearing, toxic-appearing or diaphoretic.  HENT:     Head: Normocephalic and atraumatic.     Nose: No congestion or rhinorrhea.  Eyes:     General: No scleral icterus. Cardiovascular:     Rate and Rhythm: Normal rate and regular rhythm.     Heart sounds: Normal heart sounds.  Pulmonary:     Effort: Pulmonary effort is normal.     Breath sounds: Normal breath sounds.  Abdominal:     General: Bowel sounds are normal. There is no distension.     Palpations: Abdomen is soft. There is no hepatomegaly, splenomegaly or mass.     Tenderness: There is abdominal tenderness in the epigastric area. There is no guarding or rebound.     Hernia: No hernia is present.  Musculoskeletal:     Cervical back: Neck supple.  Skin:    General: Skin is warm and dry.     Coloration: Skin is not jaundiced.     Findings: No bruising or rash.  Neurological:     General: No focal deficit present.     Mental Status: He is alert and oriented to person, place, and time. Mental status is at baseline.  Psychiatric:        Mood and Affect: Mood normal.        Behavior: Behavior normal.        Thought Content: Thought content normal.      Assessment:  Very pleasant 74 year old male  with a history of constipation and GERD previously well managed.  He is now having some exacerbations of both.  No red flag/warning signs or symptoms.  EGD was just done a year ago and essentially unremarkable.  GERD: He has been doing well on Dexilant for multiple months.  He started having some worsening symptoms about 2 weeks ago.  Overall I feel this is likely just a prolonged flare.  I do not  feel the need to pull the trigger on a repeat upper endoscopy at this time given the short duration of his symptoms.  I will add Carafate as needed to help better control his symptoms.  Hopefully he will settle down and Dexilant will continue working as previously  Constipation: Previously well managed on Colace once a day and then increase to twice a day.  He is now having some worsening constipation with hard stools and straining and a bowel movement every 3 to 4 days.  I think at this point we need to look at prescription options.  I will start him on Linzess 72 mcg daily with request a progress report.  No rectal bleeding noted.   Plan: 1. Continue Dexilant 2. Add Carafate 1 g crush tablet mixed into a slurry 4 times a day as needed 3. Stop Colace 4. Start Linzess 72 mcg daily on empty stomach 5. We will provide samples if available and request a progress report in 1 to 2 weeks 6. Call us for any worsening or severe symptoms    Thank you for allowing [place] to participate in the care of Harpreet V Finks  Walden Field, DNP, AGNP-C Adult & Gerontological Nurse Practitioner [facility]   07/12/2020 10:45 AM   Disclaimer: This note was dictated with voice recognition software. Similar sounding words can inadvertently be transcribed and may not be corrected upon review.

## 2020-07-23 ENCOUNTER — Other Ambulatory Visit: Payer: Self-pay | Admitting: Cardiovascular Disease

## 2020-07-24 ENCOUNTER — Telehealth: Payer: Self-pay | Admitting: Cardiovascular Disease

## 2020-07-24 NOTE — Telephone Encounter (Signed)
*  STAT* If patient is at the pharmacy, call can be transferred to refill team.   1. Which medications need to be refilled? (please list name of each medication and dose if known) metoprolol succinate (TOPROL-XL) 25 MG 24 hr tablet  2. Which pharmacy/location (including street and city if local pharmacy) is medication to be sent to? [place]   3. Do they need a 30 day or 90 day supply? 90  Patient out of medication

## 2020-08-01 ENCOUNTER — Other Ambulatory Visit: Payer: Self-pay

## 2020-08-01 ENCOUNTER — Encounter (INDEPENDENT_AMBULATORY_CARE_PROVIDER_SITE_OTHER): Payer: Self-pay | Admitting: Internal Medicine

## 2020-08-01 ENCOUNTER — Ambulatory Visit (INDEPENDENT_AMBULATORY_CARE_PROVIDER_SITE_OTHER): Payer: PPO | Admitting: Internal Medicine

## 2020-08-01 VITALS — BP 126/80 | HR 61 | Temp 97.1°F | Ht 72.0 in | Wt 217.2 lb

## 2020-08-01 DIAGNOSIS — K5900 Constipation, unspecified: Secondary | ICD-10-CM | POA: Diagnosis not present

## 2020-08-01 DIAGNOSIS — E785 Hyperlipidemia, unspecified: Secondary | ICD-10-CM | POA: Diagnosis not present

## 2020-08-01 MED ORDER — NP THYROID 60 MG PO TABS: 60.0000 mg | ORAL_TABLET | Freq: Every day | ORAL | 3 refills | Status: DC

## 2020-08-01 NOTE — Progress Notes (Signed)
Metrics: Intervention Frequency ACO  Documented Smoking Status Yearly  Screened one or more times in 24 months  Cessation Counseling or  Active cessation medication Past 24 months  Past 24 months   Guideline developer: UpToDate (See UpToDate for funding source) Date Released: 2014       Wellness Office Visit  Subjective:  Patient ID: [name], male    DOB: 11/27/1945  Age: 74 y.o. MRN: [id number]  CC: This very nice man comes in for follow-up to review all his blood work from the last visit. HPI  Since my visit with him, he has been started on Repatha and seems to be tolerating it and his cholesterol numbers have been improving. He continues to work on nutrition and exercise as before. His hemoglobin A1c has significantly improved to 6.6%. His thyroid tests are in the normal range but his free T3 is optimal he does have constipation and does have a history of increased visceral fat, dyslipidemia.  Remaining blood work is unremarkable. Past Medical History:  Diagnosis Date  . CHF (congestive heart failure) ([place])   . Coronary artery disease 08/28/2010   s/p multiple caths 2012, BMS PCI OM1 on August 28, 2010-during NSTEMI; January 2019 non-STEMI- occlusion of OM stent (very late stent thrombosis), initial wire crossed with PTCA, but unable to rewire, PCI aborted--> plan medical therapy, normal EF  . Diabetes mellitus   . GERD (gastroesophageal reflux disease)   . Hyperlipidemia   . Hypertension   . Non-STEMI (non-ST elevated myocardial infarction) [facility]) January 2012 and 2019   a) Jan 2012: 99% OM1 - BMS PCI; b) Jan 2019: Very late stent thrombosis/100% OM1 -after initially causing him for repeat PTCA restoring flow, unable to recross to place stent. - >  Medical therapy.  Marland Kitchen PVD (peripheral vascular disease) ([place])    left SFA PTA & stenting in 02/2005 (Dr. Adora Fridge)  . Vitamin D deficiency disease 05/04/2019   Past Surgical History:  Procedure Laterality Date  . BIOPSY   08/02/2019   Procedure: BIOPSY;  Surgeon: Daneil Dolin, MD;  [facility];  Service: Endoscopy;;  gastric   . CARDIAC CATHETERIZATION  12/23/2004   normal L main, normal LAD, normal L Cfx, RCA with 20% hypodense lesion in first end of vessel (Dr. Adora Fridge)  . CARDIAC CATHETERIZATION  08/26/2007   no significant CAD by cath, EF 50% (Dr. Jackie Plum)  . COLONOSCOPY  12/2009   Dr. Hampton Abbot  . COLONOSCOPY N/A 01/30/2017   pancolonic diverticulosis, non-bleeding internal hemorrhoids.  . COLONOSCOPY WITH PROPOFOL N/A 09/08/2019   Procedure: COLONOSCOPY WITH PROPOFOL;  Surgeon: Daneil Dolin, MD;  [facility];  Service: Endoscopy;  Laterality: N/A;  11:15am  . CORONARY BALLOON ANGIOPLASTY N/A 08/30/2017   Procedure: CORONARY BALLOON ANGIOPLASTY;  Surgeon: [name], MD;  Location: [place] [facility];  Service: Cardiovascular;  100% CTO very late stent thrombosis OM1 -> initially crossed with PTCA, but then unable to recross after losing my positioning.  PTCA ABORTED.  UNSUCCESSFUL ATTEMPT  . CORONARY BALLOON ANGIOPLASTY  02/10/2011   95% prox in-stent restenosis within OM stent - opened with cutting balloon (Dr. Corky Downs)  . CORONARY STENT INTERVENTION  07/17/2011   in-stent restenosis - re-stented with Promus 2.25x42mm DES (Dr. Roni Bread)  . CORONARY STENT INTERVENTION  08/28/2010   NSTEMI: OM1 99% BMS PCI 2.0x90mm MiniVision BMS (Dr. Adora Fridge)  . 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 ESOPHAGOGASTRODUODENOSCOPY N/A 08/02/2019   Procedure: ESOPHAGOGASTRODUODENOSCOPY (EGD);  Surgeon: Daneil Dolin, MD;  [facility];  Service: Endoscopy;  Laterality: N/A;  8:45am  . FEMORAL ARTERY STENT  02/27/2005   L SFA stenting - [place] down SFA across lesion - predilatation with 4x4 Powerflex, stenting with 7x4 Smart, post-dilatation with 6x4  powerflex (Dr. Adora Fridge)  . LEFT HEART CATH AND CORONARY ANGIOGRAPHY N/A 08/30/2017   Procedure: LEFT HEART CATH AND CORONARY ANGIOGRAPHY;  Surgeon: [name], MD;  Location: [place] [facility];  Service: Cardiovascular;  100% very late stent thrombosis of Overlapped BMS-DES OM1 -> attempted PTCA  . LEFT HEART CATH AND CORONARY ANGIOGRAPHY  08/28/2010   NSTEMI: OM1 99% BMS PCI  (Dr. Adora Fridge)  . LEFT HEART CATH AND CORONARY ANGIOGRAPHY  09/11/2010   patent stent (Dr. Roni Bread)  . LEFT HEART CATH AND CORONARY ANGIOGRAPHY  02/10/2011   95% prox in-stent restenosis within OM stent  (Dr. Corky Downs)  . LEFT HEART CATHETERIZATION WITH CORONARY ANGIOGRAM N/A 07/17/2011   Procedure: LEFT HEART CATHETERIZATION WITH CORONARY ANGIOGRAM;  Surgeon: Leonie Man, MD;  Location: [facility];  Service: Cardiovascular;  Laterality: N/A;  Right radial approach;  90% ISR of BMS (5 months post PTCA for ISR) --> DES PCI  . left knee arthroscopy  05/2016  . NM MYOCAR PERF WALL MOTION  09/08/2013   abnormal lexiscan - low to intermediate risk;   . POLYPECTOMY  09/08/2019   Procedure: POLYPECTOMY;  Surgeon: Daneil Dolin, MD;  [facility];  Service: Endoscopy;;  . TOOTH EXTRACTION Right 06/19/2020  . TRANSTHORACIC ECHOCARDIOGRAM  09/01/2017   Normal LV size and function.  EF 66 5%.  Normal wall motion.  GR 1 DD.  Mild aortic sclerosis.  Aortic root mildly dilated at 40 mm.     Family History  Problem Relation Age of Onset  . Arrhythmia Mother 84  . Colon cancer Neg Hx   . Liver disease Neg Hx   . Inflammatory bowel disease Neg Hx   . 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   Social History   Tobacco Use  . Smoking status: Former Smoker    Years: 50.00    Quit date: 05/12/1999    Years since quitting: 21.2  . Smokeless tobacco: Never Used  Substance Use  Topics  . Alcohol use: No    Current Meds  Medication Sig  . acetaminophen (TYLENOL) 500 MG tablet Take 1,000 mg by mouth as needed for moderate pain.   . Alirocumab (PRALUENT) 75 MG/ML SOAJ Inject 1 Dose into the skin every 14 (fourteen) days.  Marland Kitchen aspirin 81 MG chewable tablet Chew 1 tablet (81 mg total) by mouth daily.  . Cholecalciferol (VITAMIN D3) 50 MCG (2000 UT) TABS Take 3 tablets by mouth daily.  . clopidogrel (PLAVIX) 75 MG tablet TAKE ONE TABLET BY MOUTH ONCE DAILY.  Marland Kitchen Coenzyme Q10 (CO Q-10) 400 MG CAPS Take 400 mg by mouth daily.  Marland Kitchen dexlansoprazole (DEXILANT) 60 MG capsule Take 60 mg by mouth daily.  Marland Kitchen dextromethorphan-guaiFENesin (MUCINEX DM) 30-600 MG 12hr tablet Take 1 tablet by mouth as needed.   . fexofenadine (ALLEGRA) 180 MG tablet Take 180 mg by mouth as needed for allergies or rhinitis. Rotates with Claritin.  Marland Kitchen glipiZIDE-metformin (METAGLIP)  5-500 MG tablet TAKE (1) TABLET BY MOUTH ONCE DAILY.  . isosorbide mononitrate (IMDUR) 60 MG 24 hr tablet Take 1 tablet (60 mg total) by mouth daily.  Marland Kitchen lisinopril (PRINIVIL,ZESTRIL) 5 MG tablet Take 5 mg by mouth daily.  . metoprolol succinate (TOPROL-XL) 25 MG 24 hr tablet TAKE 2 TABLETS BY MOUTH ONCE DAILY.  . nitroGLYCERIN (NITROSTAT) 0.4 MG SL tablet DISSOLVE 1 TABLET UNDER TONGUE EVERY 5 MINUTES UP TO 15 MIN FOR CHEST PAIN. IF NO RELIEF CALL 911.  Glory Rosebush ULTRA test strip USE AS DIRECTED UP TO 3 TIMES DAILY IF NEEDED.  Marland Kitchen pantoprazole (PROTONIX) 40 MG tablet Take 40 mg by mouth as needed.   . ranolazine (RANEXA) 500 MG 12 hr tablet Take 500 mg by mouth 2 (two) times daily.  Marland Kitchen REPATHA SURECLICK 242 MG/ML SOAJ [id number] Milligram(s) SUB-Q Every 2 Weeks  . sucralfate (CARAFATE) 1 g tablet Take 1 tablet (1 g total) by mouth 4 (four) times daily as needed.      Depression screen [facility] 2/9 06/20/2020 08/17/2019  Decreased Interest 0 0  Down, Depressed, Hopeless 0 0  PHQ - 2 Score 0 0  Altered sleeping 0 -  Tired, decreased  energy 0 -  Change in appetite 0 -  Feeling bad or failure about yourself  0 -  Trouble concentrating 0 -  Moving slowly or fidgety/restless 0 -  Suicidal thoughts 0 -  PHQ-9 Score 0 -  Difficult doing work/chores Not difficult at all -     Objective:   Today's Vitals: BP 126/80   Pulse 61   Temp (!) 97.1 F (36.2 C) (Temporal)   Ht 6' (1.829 m)   Wt 217 lb 3.2 oz (98.5 kg)   SpO2 96%   BMI 29.46 kg/m  Vitals with BMI 08/01/2020 07/12/2020 07/11/2020  Height 6\' 0"  6\' 0"  6\' 0"   Weight 217 lbs 3 oz 214 lbs 10 oz 204 lbs  BMI 29.45 79.8 92.11  Systolic 941 740 814  Diastolic 80 74 69  Pulse 61 56 50     Physical Exam  He looks systemically well.  No new physical findings.     Assessment   1. Dyslipidemia   2. Constipation, unspecified constipation type       Tests ordered No orders of the defined types were placed in this encounter.    Plan: 1. I discussed with him the importance of reducing visceral fat which will reduce insulin resistance and also reduced dyslipidemia and improve coronary artery disease risk factors and hopefully prevent further events.  To my knowledge, medications such as Repatha do not reduce coronary artery plaque.  We discussed the importance of thyroid and its impact on reducing insulin resistance.  He does have some symptoms which may well represent thyroid hypofunction despite normal levels.  Therefore, after shared decision making, I will start him on desiccated NP thyroid, off label to help with symptoms and reduce insulin resistance.  I explained possible side effects and how to deal with them. 2. I will see him in about 2 months to see how he is doing and we may want to also reduce diabetic medications and remove glipizide as this will increase insulin resistance. 3. I spent 30 minutes with this patient explaining all of the above to him and he is agreeable to the plan of action.   Meds ordered this encounter  Medications  . NP  THYROID 60 MG tablet    Sig: Take 1 tablet (60  mg total) by mouth daily before breakfast.    Dispense:  30 tablet    Refill:  3    Jarrid Lienhard Luther Parody, MD

## 2020-08-21 ENCOUNTER — Encounter: Payer: Self-pay | Admitting: Cardiovascular Disease

## 2020-08-21 ENCOUNTER — Other Ambulatory Visit: Payer: Self-pay

## 2020-08-21 ENCOUNTER — Ambulatory Visit: Payer: HMO | Admitting: Cardiovascular Disease

## 2020-08-21 DIAGNOSIS — Z9861 Coronary angioplasty status: Secondary | ICD-10-CM | POA: Diagnosis not present

## 2020-08-21 DIAGNOSIS — I2510 Atherosclerotic heart disease of native coronary artery without angina pectoris: Secondary | ICD-10-CM | POA: Diagnosis not present

## 2020-08-21 DIAGNOSIS — I739 Peripheral vascular disease, unspecified: Secondary | ICD-10-CM | POA: Diagnosis not present

## 2020-08-21 DIAGNOSIS — I10 Essential (primary) hypertension: Secondary | ICD-10-CM | POA: Diagnosis not present

## 2020-08-21 DIAGNOSIS — E785 Hyperlipidemia, unspecified: Secondary | ICD-10-CM | POA: Diagnosis not present

## 2020-08-21 NOTE — Assessment & Plan Note (Signed)
History of essential hypertension a blood pressure measured today at 132/74.  He is on Prinivil and Toprol.

## 2020-08-21 NOTE — Patient Instructions (Signed)

## 2020-08-21 NOTE — Assessment & Plan Note (Signed)
History of CAD status post first obtuse marginal branch stenting by myself 08/28/2010.  He has been cath by Dr. Ellyn Hack and Dr. Claiborne Billings since with 3 intervention by Dr. Claiborne Billings 02/10/2012.  I recatheterized him because of chest pain 08/30/2017 revealing occluded first marginal branch stent which I was unable to cross.  He recently saw Dr. Ellyn Hack in the office because of chest pain 06/22/2020 who ordered a Myoview stress test that showed inferior thinning without ischemia.  He no longer has chest pain.

## 2020-08-21 NOTE — Assessment & Plan Note (Signed)
History of peripheral arterial disease status post left SFA intervention by myself in 2006 with recent Dopplers performed 08/31/2019 revealing normal ABIs and normal velocities suggesting widely patent left SFA.  Patient denies claudication.

## 2020-08-21 NOTE — Progress Notes (Signed)
08/21/2020 [name]   April 02, 1946  [id number]  Primary Physician [name], MD Primary Cardiologist: [name] MD Garret Reddish, [place], [place]  HPI:  [name] is a 75 y.o.  mildly overweight married African American male father of 3 who I last spoke to on the phone for a virtual telemedicine phone visit 09/29/2019. He did see Kerin Ransom in the office 06/21/2019 and KathrynLawrence 03/22/2019.Marland Kitchen He has a history of PVOD status post left SFA, PTA and stenting by myself back in July of 2006 with subsequent improvement in his claudication and Dopplers. These were last done in April of last year and showed ABIs of greater than 1 bilaterally. His other problems include hypertension, hyperlipidemia, non-insulin-requiring diabetes and statin intolerance. I catheterized him August 28, 2010 and stented his first OM branch with a bare metal stent. He was readmitted September 09, 2010 with recurrent chest pain and was re-cathed by Dr. Ellyn Hack via the right radial approach revealing a widely patent stent. He was admitted again June 29th through July 3rd with chest pain and ruled out for myocardial infarction. His stress test showed subtle lateral ischemia and cath performed by Dr. Claiborne Billings February 10, 2012 revealed 95% proximal in-stent restenosis within the OM stent which Dr. Claiborne Billings opened up with a cutting balloon. He had done well until December of last year when he was admitted again with chest pain. Dr. Ellyn Hack recathed him revealing again in-stent restenosis which was "restented" with a Promus drug-eluting stent. He was having chest pain when I saw him back in December, however, since that time he has had minimal chest pain and he does walk 3-5 miles a day. His most recent liver profile performed 08/22/15 revealed an LDL of 94 and HDL 47 on Zocor 20 mg a day. He was recently admitted with chest pain/rule out MI 08/30/13. This is either negative. 2-D echo was essentially unremarkable with mild  inferoseptal hypokinesia and a Myoview stress test was read as low risk. His medications were adjusted. Ranexa was added..He has had no recurrent chest pain since I saw him a year ago until recently when he was taking his Ranexa once a day instead of twice a day.Since I saw him a year ago he deniesclaudication but does complain of new onset chest pain several weeks ago which is exertional Recatheterized him revealing a totally occluded first obtuse marginal branch stent which I was unable to open. The remainder of his coronary anatomy was free of significant disease and his LV function was normal.The decision was made to treat him medically at that time with Imdur and regular ranolazine.  Since spoke to him on the phone almost a year ago he did see Dr. Ellyn Hack back in the office in November for chest pain who ordered a Myoview stress test that showed only inferior thinning without ischemia.  He has had no recurrent symptoms.  His most recent lower extremity arterial Doppler studies performed 08/31/2019 revealed normal ABIs bilaterally with patent left SFA stent.  He was found to be statin intolerant with elevated enzymes was started on Repatha with an excellent clinical result.   Current Meds  Medication Sig  . acetaminophen (TYLENOL) 500 MG tablet Take 1,000 mg by mouth as needed for moderate pain.   . Alirocumab (PRALUENT) 75 MG/ML SOAJ Inject 1 Dose into the skin every 14 (fourteen) days.  Marland Kitchen aspirin 81 MG chewable tablet Chew 1 tablet (81 mg total) by mouth daily.  . Cholecalciferol (VITAMIN  D3) 50 MCG (2000 UT) TABS Take 3 tablets by mouth daily.  . clopidogrel (PLAVIX) 75 MG tablet TAKE ONE TABLET BY MOUTH ONCE DAILY.  Marland Kitchen Coenzyme Q10 (CO Q-10) 400 MG CAPS Take 400 mg by mouth daily.  Marland Kitchen dexlansoprazole (DEXILANT) 60 MG capsule Take 60 mg by mouth daily.  Marland Kitchen dextromethorphan-guaiFENesin (MUCINEX DM) 30-600 MG 12hr tablet Take 1 tablet by mouth as needed.   . fexofenadine (ALLEGRA) 180 MG  tablet Take 180 mg by mouth as needed for allergies or rhinitis. Rotates with Claritin.  Marland Kitchen glipiZIDE-metformin (METAGLIP) 5-500 MG tablet TAKE (1) TABLET BY MOUTH ONCE DAILY.  . isosorbide mononitrate (IMDUR) 60 MG 24 hr tablet Take 1 tablet (60 mg total) by mouth daily.  Marland Kitchen lisinopril (PRINIVIL,ZESTRIL) 5 MG tablet Take 5 mg by mouth daily.  . metoprolol succinate (TOPROL-XL) 25 MG 24 hr tablet TAKE 2 TABLETS BY MOUTH ONCE DAILY.  . nitroGLYCERIN (NITROSTAT) 0.4 MG SL tablet DISSOLVE 1 TABLET UNDER TONGUE EVERY 5 MINUTES UP TO 15 MIN FOR CHEST PAIN. IF NO RELIEF CALL 911.  . NP THYROID 60 MG tablet Take 1 tablet (60 mg total) by mouth daily before breakfast.  . ONETOUCH ULTRA test strip USE AS DIRECTED UP TO 3 TIMES DAILY IF NEEDED.  Marland Kitchen pantoprazole (PROTONIX) 40 MG tablet Take 40 mg by mouth as needed.   . ranolazine (RANEXA) 500 MG 12 hr tablet Take 500 mg by mouth 2 (two) times daily.  Marland Kitchen REPATHA SURECLICK [id number] MG/ML SOAJ [id number] Milligram(s) SUB-Q Every 2 Weeks  . sucralfate (CARAFATE) 1 g tablet Take 1 tablet (1 g total) by mouth 4 (four) times daily as needed.     Allergies  Allergen Reactions  . Crestor [Rosuvastatin]     myalgia  . Propoxyphene N-Acetaminophen Nausea Only  . Statins Other (See Comments)    Severe muscle cramping/aching/pain/ elevated CK  . Tape     Blisters  . Zetia [Ezetimibe]     Muscle cramping  . Zocor [Simvastatin]     myalgia  . Flomax [Tamsulosin Hcl] Rash  . Levaquin [Levofloxacin Hemihydrate] Rash  . Penicillins Rash    Broke out in rash 6 years ago, pt recently took penicillin (09/2016) and had no reaction Has patient had a PCN reaction causing immediate rash, facial/tongue/throat swelling, SOB or lightheadedness with hypotension: Yes Has patient had a PCN reaction causing severe rash involving mucus membranes or skin necrosis: Unknown Has patient had a PCN reaction that required hospitalization: No Has patient had a PCN reaction occurring within  the last 10 years: Yes If all of the above answers are "NO", then m    Social History   Socioeconomic History  . Marital status: Married    Spouse name: Not on file  . Number of children: 3  . Years of education: Not on file  . Highest education level: Not on file  Occupational History  . Occupation: Retired    Fish farm manager: [employer]  Tobacco Use  . Smoking status: Former Smoker    Years: 50.00    Quit date: 05/12/1999    Years since quitting: 21.2  . Smokeless tobacco: Never Used  Vaping Use  . Vaping Use: Never used  Substance and Sexual Activity  . Alcohol use: No  . Drug use: No  . Sexual activity: Never  Other Topics Concern  . 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   Social Determinants of  Health   Financial Resource Strain: Not on file  Food Insecurity: Not on file  Transportation Needs: Not on file  Physical Activity: Not on file  Stress: Not on file  Social Connections: Not on file  Intimate Partner Violence: Not on file     Review of Systems: General: negative for chills, fever, night sweats or weight changes.  Cardiovascular: negative for chest pain, dyspnea on exertion, edema, orthopnea, palpitations, paroxysmal nocturnal dyspnea or shortness of breath Dermatological: negative for rash Respiratory: negative for cough or wheezing Urologic: negative for hematuria Abdominal: negative for nausea, vomiting, diarrhea, bright red blood per rectum, melena, or hematemesis Neurologic: negative for visual changes, syncope, or dizziness All other systems reviewed and are otherwise negative except as noted above.    Blood pressure 132/74, pulse 60, height 6' (1.829 m), weight 214 lb (97.1 kg).  General appearance: alert and no distress Neck: no adenopathy, no carotid bruit, no JVD, supple, symmetrical, trachea midline and thyroid not enlarged, symmetric, no  tenderness/mass/nodules Lungs: clear to auscultation bilaterally Heart: regular rate and rhythm, S1, S2 normal, no murmur, click, rub or gallop Extremities: extremities normal, atraumatic, no cyanosis or edema Pulses: 2+ and symmetric Skin: Skin color, texture, turgor normal. No rashes or lesions Neurologic: Alert and oriented X 3, normal strength and tone. Normal symmetric reflexes. Normal coordination and gait  EKG not performed today  ASSESSMENT AND PLAN:   Peripheral vascular disease ([place]) History of peripheral arterial disease status post left SFA intervention by myself in 2006 with recent Dopplers performed 08/31/2019 revealing normal ABIs and normal velocities suggesting widely patent left SFA.  Patient denies claudication.  CAD S/P percutaneous coronary angioplasty History of CAD status post first obtuse marginal branch stenting by myself 08/28/2010.  He has been cath by Dr. Ellyn Hack and Dr. Claiborne Billings since with 3 intervention by Dr. Claiborne Billings 02/10/2012.  I recatheterized him because of chest pain 08/30/2017 revealing occluded first marginal branch stent which I was unable to cross.  He recently saw Dr. Ellyn Hack in the office because of chest pain 06/22/2020 who ordered a Myoview stress test that showed inferior thinning without ischemia.  He no longer has chest pain.  Hyperlipidemia with target LDL less than 70 History of hyperlipidemia intolerant to statin therapy currently on Repatha with an excellent lipid profile recently measured 07/04/2020.  His total cholesterol is 131, LDL 65 and HDL 41.  Essential hypertension History of essential hypertension a blood pressure measured today at 132/74.  He is on Prinivil and Toprol.      [name] MD FACP,FACC,FAHA, [facility] 08/21/2020 3:18 PM

## 2020-08-21 NOTE — Assessment & Plan Note (Signed)
History of hyperlipidemia intolerant to statin therapy currently on Repatha with an excellent lipid profile recently measured 07/04/2020.  His total cholesterol is 131, LDL 65 and HDL 41.

## 2020-08-23 ENCOUNTER — Other Ambulatory Visit (INDEPENDENT_AMBULATORY_CARE_PROVIDER_SITE_OTHER): Payer: Self-pay | Admitting: Internal Medicine

## 2020-08-31 ENCOUNTER — Ambulatory Visit (HOSPITAL_COMMUNITY)
Admission: RE | Admit: 2020-08-31 | Payer: HMO | Source: Ambulatory Visit | Attending: Cardiovascular Disease | Admitting: Cardiovascular Disease

## 2020-09-05 ENCOUNTER — Other Ambulatory Visit (HOSPITAL_COMMUNITY): Payer: Self-pay | Admitting: Cardiovascular Disease

## 2020-09-05 ENCOUNTER — Ambulatory Visit (HOSPITAL_COMMUNITY)
Admission: RE | Admit: 2020-09-05 | Discharge: 2020-09-05 | Disposition: A | Discharge status: Home / Self Care | Payer: HMO | Source: Ambulatory Visit | Attending: Cardiovascular Disease | Admitting: Cardiovascular Disease

## 2020-09-05 ENCOUNTER — Other Ambulatory Visit: Payer: Self-pay

## 2020-09-05 DIAGNOSIS — I739 Peripheral vascular disease, unspecified: Secondary | ICD-10-CM

## 2020-09-05 DIAGNOSIS — Z95820 Peripheral vascular angioplasty status with implants and grafts: Secondary | ICD-10-CM | POA: Insufficient documentation

## 2020-09-05 DIAGNOSIS — I70209 Unspecified atherosclerosis of native arteries of extremities, unspecified extremity: Secondary | ICD-10-CM

## 2020-09-17 ENCOUNTER — Other Ambulatory Visit: Payer: Self-pay

## 2020-09-17 ENCOUNTER — Ambulatory Visit (INDEPENDENT_AMBULATORY_CARE_PROVIDER_SITE_OTHER): Payer: HMO

## 2020-09-17 ENCOUNTER — Ambulatory Visit
Admission: EM | Admit: 2020-09-17 | Discharge: 2020-09-17 | Disposition: A | Discharge status: Home / Self Care | Payer: HMO | Attending: Emergency Medicine | Admitting: Emergency Medicine

## 2020-09-17 DIAGNOSIS — R0789 Other chest pain: Secondary | ICD-10-CM | POA: Diagnosis not present

## 2020-09-17 DIAGNOSIS — W19XXXA Unspecified fall, initial encounter: Secondary | ICD-10-CM

## 2020-09-17 DIAGNOSIS — R0781 Pleurodynia: Secondary | ICD-10-CM

## 2020-09-17 DIAGNOSIS — R079 Chest pain, unspecified: Secondary | ICD-10-CM | POA: Diagnosis not present

## 2020-09-17 NOTE — ED Triage Notes (Signed)
Patient presents to Urgent Care with complaints of a fall that occurred today. He reports walking down steps and slipping landing on his right chest/back area. He is concerned with possible rib fracture. He states he has discomfort with deep breathing.

## 2020-09-17 NOTE — Discharge Instructions (Addendum)
Continue to take OTC Tylenol 500 as needed for pain Follow RICE instructions as attached Follow-up with PCP Return or go to ED if you develop any new or worsening of your symptoms

## 2020-09-17 NOTE — ED Provider Notes (Signed)
[place]   [id number] 09/17/20 Arrival Time: 1221   Chief Complaint  Patient presents with  . Fall     SUBJECTIVE: History from: patient.  [name] is a 75 y.o. male who presented to the urgent care with a complaint of right rib cage pain that occurred today.  Developed the symptom after falling while going down steps.  Localized pain to the right chest/back.  He describes the pain as constant and achy.  He has tried OTC medications without relief.  His symptoms are made worse with respiration and ROM.  He denies similar symptoms in the past.  Denies fever, fever, nausea, vomiting, diarrhea, SOB, LOC, confusion.  ROS: As per HPI.  All other pertinent ROS negative.      Past Medical History:  Diagnosis Date  . CHF (congestive heart failure) ([place])   . Coronary artery disease 08/28/2010   s/p multiple caths 2012, BMS PCI OM1 on August 28, 2010-during NSTEMI; January 2019 non-STEMI- occlusion of OM stent (very late stent thrombosis), initial wire crossed with PTCA, but unable to rewire, PCI aborted--> plan medical therapy, normal EF  . Diabetes mellitus   . GERD (gastroesophageal reflux disease)   . Hyperlipidemia   . Hypertension   . Non-STEMI (non-ST elevated myocardial infarction) [facility]) January 2012 and 2019   a) Jan 2012: 99% OM1 - BMS PCI; b) Jan 2019: Very late stent thrombosis/100% OM1 -after initially causing him for repeat PTCA restoring flow, unable to recross to place stent. - >  Medical therapy.  Marland Kitchen PVD (peripheral vascular disease) ([place])    left SFA PTA & stenting in 02/2005 (Dr. Adora Fridge)  . Vitamin D deficiency disease 05/04/2019   Past Surgical History:  Procedure Laterality Date  . BIOPSY  08/02/2019   Procedure: BIOPSY;  Surgeon: Daneil Dolin, MD;  [facility];  Service: Endoscopy;;  gastric   . CARDIAC CATHETERIZATION  12/23/2004   normal L main, normal LAD, normal L Cfx, RCA with 20% hypodense lesion in first end of vessel (Dr. Adora Fridge)  . CARDIAC CATHETERIZATION  08/26/2007   no significant CAD by cath, EF 50% (Dr. Jackie Plum)  . COLONOSCOPY  12/2009   Dr. Hampton Abbot  . COLONOSCOPY N/A 01/30/2017   pancolonic diverticulosis, non-bleeding internal hemorrhoids.  . COLONOSCOPY WITH PROPOFOL N/A 09/08/2019   Procedure: COLONOSCOPY WITH PROPOFOL;  Surgeon: Daneil Dolin, MD;  [facility];  Service: Endoscopy;  Laterality: N/A;  11:15am  . CORONARY BALLOON ANGIOPLASTY N/A 08/30/2017   Procedure: CORONARY BALLOON ANGIOPLASTY;  Surgeon: [name], MD;  Location: [place] CV LAB;  Service: Cardiovascular;  100% CTO very late stent thrombosis OM1 -> initially crossed with PTCA, but then unable to recross after losing my positioning.  PTCA ABORTED.  UNSUCCESSFUL ATTEMPT  . CORONARY BALLOON ANGIOPLASTY  02/10/2011   95% prox in-stent restenosis within OM stent - opened with cutting balloon (Dr. Corky Downs)  . CORONARY STENT INTERVENTION  07/17/2011   in-stent restenosis - re-stented with Promus 2.25x66mm DES (Dr. Roni Bread)  . CORONARY STENT INTERVENTION  08/28/2010   NSTEMI: OM1 99% BMS PCI 2.0x76mm MiniVision BMS (Dr. Adora Fridge)  . 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 ESOPHAGOGASTRODUODENOSCOPY N/A 08/02/2019   Procedure: ESOPHAGOGASTRODUODENOSCOPY (EGD);  Surgeon: Daneil Dolin, MD;  [facility];  Service: Endoscopy;  Laterality: N/A;  8:45am  . FEMORAL ARTERY  STENT  02/27/2005   L SFA stenting - Wholey down SFA across lesion - predilatation with 4x4 Powerflex, stenting with 7x4 Smart, post-dilatation with 6x4 powerflex (Dr. Adora Fridge)  . LEFT HEART CATH AND CORONARY ANGIOGRAPHY N/A 08/30/2017   Procedure: LEFT HEART CATH AND CORONARY ANGIOGRAPHY;  Surgeon: [name], MD;  Location: [place] CV LAB;  Service: Cardiovascular;  100% very late stent thrombosis of  Overlapped BMS-DES OM1 -> attempted PTCA  . LEFT HEART CATH AND CORONARY ANGIOGRAPHY  08/28/2010   NSTEMI: OM1 99% BMS PCI  (Dr. Adora Fridge)  . LEFT HEART CATH AND CORONARY ANGIOGRAPHY  09/11/2010   patent stent (Dr. Roni Bread)  . LEFT HEART CATH AND CORONARY ANGIOGRAPHY  02/10/2011   95% prox in-stent restenosis within OM stent  (Dr. Corky Downs)  . LEFT HEART CATHETERIZATION WITH CORONARY ANGIOGRAM N/A 07/17/2011   Procedure: LEFT HEART CATHETERIZATION WITH CORONARY ANGIOGRAM;  Surgeon: Leonie Man, MD;  Location: [facility] CATH LAB;  Service: Cardiovascular;  Laterality: N/A;  Right radial approach;  90% ISR of BMS (5 months post PTCA for ISR) --> DES PCI  . left knee arthroscopy  05/2016  . NM MYOCAR PERF WALL MOTION  09/08/2013   abnormal lexiscan - low to intermediate risk;   . POLYPECTOMY  09/08/2019   Procedure: POLYPECTOMY;  Surgeon: Daneil Dolin, MD;  [facility];  Service: Endoscopy;;  . TOOTH EXTRACTION Right 06/19/2020  . TRANSTHORACIC ECHOCARDIOGRAM  09/01/2017   Normal LV size and function.  EF 66 5%.  Normal wall motion.  GR 1 DD.  Mild aortic sclerosis.  Aortic root mildly dilated at 40 mm.   Allergies  Allergen Reactions  . Crestor [Rosuvastatin]     myalgia  . Propoxyphene N-Acetaminophen Nausea Only  . Statins Other (See Comments)    Severe muscle cramping/aching/pain/ elevated CK  . Tape     Blisters  . Zetia [Ezetimibe]     Muscle cramping  . Zocor [Simvastatin]     myalgia  . Flomax [Tamsulosin Hcl] Rash  . Levaquin [Levofloxacin Hemihydrate] Rash  . Penicillins Rash    Broke out in rash 6 years ago, pt recently took penicillin (09/2016) and had no reaction Has patient had a PCN reaction causing immediate rash, facial/tongue/throat swelling, SOB or lightheadedness with hypotension: Yes Has patient had a PCN reaction causing severe rash involving mucus membranes or skin necrosis: Unknown Has patient had a PCN reaction that required hospitalization:  No Has patient had a PCN reaction occurring within the last 10 years: Yes If all of the above answers are "NO", then m   No current facility-administered medications on file prior to encounter.   Current Outpatient Medications on File Prior to Encounter  Medication Sig Dispense Refill  . acetaminophen (TYLENOL) 500 MG tablet Take 1,000 mg by mouth as needed for moderate pain.     . Alirocumab (PRALUENT) 75 MG/ML SOAJ Inject 1 Dose into the skin every 14 (fourteen) days.    Marland Kitchen aspirin 81 MG chewable tablet Chew 1 tablet (81 mg total) by mouth daily.    . Cholecalciferol (VITAMIN D3) 50 MCG (2000 UT) TABS Take 3 tablets by mouth daily.    . clopidogrel (PLAVIX) 75 MG tablet TAKE ONE TABLET BY MOUTH ONCE DAILY. 30 tablet 9  . Coenzyme Q10 (CO Q-10) 400 MG CAPS Take 400 mg by mouth daily.    Marland Kitchen dexlansoprazole (DEXILANT) 60 MG capsule Take 60 mg by mouth daily.    Marland Kitchen  dextromethorphan-guaiFENesin (MUCINEX DM) 30-600 MG 12hr tablet Take 1 tablet by mouth as needed.     . fexofenadine (ALLEGRA) 180 MG tablet Take 180 mg by mouth as needed for allergies or rhinitis. Rotates with Claritin.    Marland Kitchen glipiZIDE-metformin (METAGLIP) 5-500 MG tablet TAKE (1) TABLET BY MOUTH ONCE DAILY. 90 tablet 0  . isosorbide mononitrate (IMDUR) 60 MG 24 hr tablet Take 1 tablet (60 mg total) by mouth daily. 90 tablet 3  . lisinopril (PRINIVIL,ZESTRIL) 5 MG tablet Take 5 mg by mouth daily.    . metoprolol succinate (TOPROL-XL) 25 MG 24 hr tablet TAKE 2 TABLETS BY MOUTH ONCE DAILY. 180 tablet 3  . nitroGLYCERIN (NITROSTAT) 0.4 MG SL tablet DISSOLVE 1 TABLET UNDER TONGUE EVERY 5 MINUTES UP TO 15 MIN FOR CHEST PAIN. IF NO RELIEF CALL 911. 25 tablet 0  . NP THYROID 60 MG tablet Take 1 tablet (60 mg total) by mouth daily before breakfast. 30 tablet 3  . ONETOUCH ULTRA test strip USE AS DIRECTED UP TO 3 TIMES DAILY IF NEEDED. 100 strip 3  . pantoprazole (PROTONIX) 40 MG tablet Take 40 mg by mouth as needed.     . ranolazine (RANEXA)  500 MG 12 hr tablet Take 500 mg by mouth 2 (two) times daily.    Marland Kitchen REPATHA SURECLICK 295 MG/ML SOAJ [id number] Milligram(s) SUB-Q Every 2 Weeks    . sucralfate (CARAFATE) 1 g tablet Take 1 tablet (1 g total) by mouth 4 (four) times daily as needed. 90 tablet 1   Social History   Socioeconomic History  . Marital status: Married    Spouse name: Not on file  . Number of children: 3  . Years of education: Not on file  . Highest education level: Not on file  Occupational History  . Occupation: Retired    Fish farm manager: [employer]  Tobacco Use  . Smoking status: Former Smoker    Years: 50.00    Quit date: 05/12/1999    Years since quitting: 21.3  . Smokeless tobacco: Never Used  Vaping Use  . Vaping Use: Never used  Substance and Sexual Activity  . Alcohol use: No  . Drug use: No  . Sexual activity: Never  Other Topics Concern  . 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   Social Determinants of Health   Financial Resource Strain: Not on file  Food Insecurity: Not on file  Transportation Needs: Not on file  Physical Activity: Not on file  Stress: Not on file  Social Connections: Not on file  Intimate Partner Violence: Not on file   Family History  Problem Relation Age of Onset  . Arrhythmia Mother 64  . Colon cancer Neg Hx   . Liver disease Neg Hx   . Inflammatory bowel disease Neg Hx   . Colon polyps Neg Hx     OBJECTIVE:  Vitals:   09/17/20 1242 09/17/20 1243  BP:  (S) (!) 168/79  Pulse:  (S) (!) 52  Resp:  16  TempSrc:  Oral  SpO2:  97%  Weight: 206 lb (93.4 kg)      Physical Exam Vitals and nursing note reviewed.  Constitutional:      General: He is not in acute distress.    Appearance: Normal appearance. He is normal weight. He is not ill-appearing, toxic-appearing or diaphoretic.  Cardiovascular:     Rate and Rhythm: Normal rate and regular rhythm.  Pulses: Normal pulses.     Heart sounds: Normal heart sounds. No murmur heard. No friction rub. No gallop.   Pulmonary:     Effort: Pulmonary effort is normal. No respiratory distress.     Breath sounds: Normal breath sounds. No stridor. No wheezing, rhonchi or rales.  Chest:     Chest wall: No tenderness.  Musculoskeletal:        General: Tenderness present.     Comments: The right rib cage area is without any obvious deformity or asymmetry when compared to the left rib cage area.  There is no ecchymosis, open wound, lesion, warmth or swelling present.  Tenderness on palpation to right rib cage area.  Neurological:     Mental Status: He is alert and oriented to person, place, and time.     LABS:  No results found for this or any previous visit (from the past 24 hour(s)).   RADIOLOGY  DG Ribs Unilateral W/Chest Right  Result Date: 09/17/2020 CLINICAL DATA:  Recent fall with right-sided chest pain, initial encounter EXAM: RIGHT RIBS AND CHEST - 3+ VIEW COMPARISON:  08/29/2017 FINDINGS: Cardiac shadow is within normal limits. Mild aortic calcifications are seen. The lungs are clear bilaterally. No pneumothorax is noted. Small radiopaque foreign body is noted in the anterior chest wall soft tissues stable from a prior exam dated 08/29/2017. No rib fracture is seen. IMPRESSION: No evidence of acute rib fracture. Electronically Signed   By: Inez Catalina M.D.   On: 09/17/2020 13:02   Rib unilateral with right chest x-ray is negative for bony abnormality including fracture or dislocation.  I have reviewed the x-ray myself and the radiologist interpretation.  I am in agreement with the radiologist interpretation.  ASSESSMENT & PLAN:  1. Fall, initial encounter   2. Rib pain on right side     No orders of the defined types were placed in this encounter.  Discharge instructions  Continue to take OTC Tylenol 500 as needed for pain Follow RICE instructions as attached Follow-up with  PCP Return or go to ED if you develop any new or worsening of your symptoms  Reviewed expectations re: course of current medical issues. Questions answered. Outlined signs and symptoms indicating need for more acute intervention. Patient verbalized understanding. After Visit Summary given.         Emerson Monte, FNP 09/17/20 1315

## 2020-09-19 ENCOUNTER — Ambulatory Visit: Payer: PPO | Admitting: Cardiovascular Disease

## 2020-10-02 ENCOUNTER — Ambulatory Visit (INDEPENDENT_AMBULATORY_CARE_PROVIDER_SITE_OTHER): Payer: HMO | Admitting: Internal Medicine

## 2020-10-02 ENCOUNTER — Ambulatory Visit (HOSPITAL_COMMUNITY)
Admission: RE | Admit: 2020-10-02 | Discharge: 2020-10-02 | Disposition: A | Discharge status: Home / Self Care | Payer: HMO | Source: Ambulatory Visit | Attending: Internal Medicine | Admitting: Internal Medicine

## 2020-10-02 ENCOUNTER — Encounter (INDEPENDENT_AMBULATORY_CARE_PROVIDER_SITE_OTHER): Payer: Self-pay | Admitting: Internal Medicine

## 2020-10-02 ENCOUNTER — Other Ambulatory Visit: Payer: Self-pay

## 2020-10-02 VITALS — BP 132/72 | HR 68 | Temp 97.3°F | Ht 72.0 in | Wt 215.4 lb

## 2020-10-02 DIAGNOSIS — E785 Hyperlipidemia, unspecified: Secondary | ICD-10-CM | POA: Diagnosis not present

## 2020-10-02 DIAGNOSIS — E119 Type 2 diabetes mellitus without complications: Secondary | ICD-10-CM | POA: Diagnosis not present

## 2020-10-02 DIAGNOSIS — M7989 Other specified soft tissue disorders: Secondary | ICD-10-CM | POA: Diagnosis not present

## 2020-10-02 DIAGNOSIS — I10 Essential (primary) hypertension: Secondary | ICD-10-CM | POA: Diagnosis not present

## 2020-10-02 DIAGNOSIS — M25571 Pain in right ankle and joints of right foot: Secondary | ICD-10-CM

## 2020-10-02 NOTE — Progress Notes (Signed)
Metrics: Intervention Frequency ACO  Documented Smoking Status Yearly  Screened one or more times in 24 months  Cessation Counseling or  Active cessation medication Past 24 months  Past 24 months   Guideline developer: UpToDate (See UpToDate for funding source) Date Released: 2014       Wellness Office Visit  Subjective:  Patient ID: [name], male    DOB: 1945/11/21  Age: 75 y.o. MRN: [id number]  CC: This man comes in for follow-up regarding his thyroid therapy, diabetes, hypertension, dyslipidemia. HPI  He injured his right ankle area 2 weeks ago when he had a fall on ice.  He injured also his chest area and he went to the urgent center.  Chest x-ray did not show any evidence of rib fractures.  However, he has had swelling of the right ankle area although he is able to walk on it.  It is tender and painful. He has tolerated NP thyroid 60 mg daily.  He denies any specific side effects. He continues on a combination of glipizide/Metformin for his diabetes.  His last hemoglobin A1c had actually shown great improvement. Past Medical History:  Diagnosis Date  . CHF (congestive heart failure) ([place])   . Coronary artery disease 08/28/2010   s/p multiple caths 2012, BMS PCI OM1 on August 28, 2010-during NSTEMI; January 2019 non-STEMI- occlusion of OM stent (very late stent thrombosis), initial wire crossed with PTCA, but unable to rewire, PCI aborted--> plan medical therapy, normal EF  . Diabetes mellitus   . GERD (gastroesophageal reflux disease)   . Hyperlipidemia   . Hypertension   . Non-STEMI (non-ST elevated myocardial infarction) [facility]) January 2012 and 2019   a) Jan 2012: 99% OM1 - BMS PCI; b) Jan 2019: Very late stent thrombosis/100% OM1 -after initially causing him for repeat PTCA restoring flow, unable to recross to place stent. - >  Medical therapy.  Marland Kitchen PVD (peripheral vascular disease) ([place])    left SFA PTA & stenting in 02/2005 (Dr. Adora Fridge)  . Vitamin D deficiency disease  05/04/2019   Past Surgical History:  Procedure Laterality Date  . BIOPSY  08/02/2019   Procedure: BIOPSY;  Surgeon: Daneil Dolin, MD;  [facility];  Service: Endoscopy;;  gastric   . CARDIAC CATHETERIZATION  12/23/2004   normal L main, normal LAD, normal L Cfx, RCA with 20% hypodense lesion in first end of vessel (Dr. Adora Fridge)  . CARDIAC CATHETERIZATION  08/26/2007   no significant CAD by cath, EF 50% (Dr. Jackie Plum)  . COLONOSCOPY  12/2009   Dr. Hampton Abbot  . COLONOSCOPY N/A 01/30/2017   pancolonic diverticulosis, non-bleeding internal hemorrhoids.  . COLONOSCOPY WITH PROPOFOL N/A 09/08/2019   Procedure: COLONOSCOPY WITH PROPOFOL;  Surgeon: Daneil Dolin, MD;  [facility];  Service: Endoscopy;  Laterality: N/A;  11:15am  . CORONARY BALLOON ANGIOPLASTY N/A 08/30/2017   Procedure: CORONARY BALLOON ANGIOPLASTY;  Surgeon: [name], MD;  Location: [place] CV LAB;  Service: Cardiovascular;  100% CTO very late stent thrombosis OM1 -> initially crossed with PTCA, but then unable to recross after losing my positioning.  PTCA ABORTED.  UNSUCCESSFUL ATTEMPT  . CORONARY BALLOON ANGIOPLASTY  02/10/2011   95% prox in-stent restenosis within OM stent - opened with cutting balloon (Dr. Corky Downs)  . CORONARY STENT INTERVENTION  07/17/2011   in-stent restenosis - re-stented with Promus 2.25x66mm DES (Dr. Roni Bread)  . CORONARY STENT INTERVENTION  08/28/2010   NSTEMI: OM1 99% BMS  PCI 2.0x50mm MiniVision BMS (Dr. Adora Fridge)  . 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 ESOPHAGOGASTRODUODENOSCOPY N/A 08/02/2019   Procedure: ESOPHAGOGASTRODUODENOSCOPY (EGD);  Surgeon: Daneil Dolin, MD;  [facility];  Service: Endoscopy;  Laterality: N/A;  8:45am  . FEMORAL ARTERY STENT  02/27/2005   L SFA stenting - Wholey down SFA across lesion -  predilatation with 4x4 Powerflex, stenting with 7x4 Smart, post-dilatation with 6x4 powerflex (Dr. Adora Fridge)  . LEFT HEART CATH AND CORONARY ANGIOGRAPHY N/A 08/30/2017   Procedure: LEFT HEART CATH AND CORONARY ANGIOGRAPHY;  Surgeon: [name], MD;  Location: [place] CV LAB;  Service: Cardiovascular;  100% very late stent thrombosis of Overlapped BMS-DES OM1 -> attempted PTCA  . LEFT HEART CATH AND CORONARY ANGIOGRAPHY  08/28/2010   NSTEMI: OM1 99% BMS PCI  (Dr. Adora Fridge)  . LEFT HEART CATH AND CORONARY ANGIOGRAPHY  09/11/2010   patent stent (Dr. Roni Bread)  . LEFT HEART CATH AND CORONARY ANGIOGRAPHY  02/10/2011   95% prox in-stent restenosis within OM stent  (Dr. Corky Downs)  . LEFT HEART CATHETERIZATION WITH CORONARY ANGIOGRAM N/A 07/17/2011   Procedure: LEFT HEART CATHETERIZATION WITH CORONARY ANGIOGRAM;  Surgeon: Leonie Man, MD;  Location: [facility] CATH LAB;  Service: Cardiovascular;  Laterality: N/A;  Right radial approach;  90% ISR of BMS (5 months post PTCA for ISR) --> DES PCI  . left knee arthroscopy  05/2016  . NM MYOCAR PERF WALL MOTION  09/08/2013   abnormal lexiscan - low to intermediate risk;   . POLYPECTOMY  09/08/2019   Procedure: POLYPECTOMY;  Surgeon: Daneil Dolin, MD;  [facility];  Service: Endoscopy;;  . TOOTH EXTRACTION Right 06/19/2020  . TRANSTHORACIC ECHOCARDIOGRAM  09/01/2017   Normal LV size and function.  EF 66 5%.  Normal wall motion.  GR 1 DD.  Mild aortic sclerosis.  Aortic root mildly dilated at 40 mm.     Family History  Problem Relation Age of Onset  . Arrhythmia Mother 41  . Colon cancer Neg Hx   . Liver disease Neg Hx   . Inflammatory bowel disease Neg Hx   . 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   Social History   Tobacco Use  . Smoking status: Former Smoker    Years: 50.00    Quit date: 05/12/1999     Years since quitting: 21.4  . Smokeless tobacco: Never Used  Substance Use Topics  . Alcohol use: No    Current Meds  Medication Sig  . acetaminophen (TYLENOL) 500 MG tablet Take 1,000 mg by mouth as needed for moderate pain.   . Alirocumab (PRALUENT) 75 MG/ML SOAJ Inject 1 Dose into the skin every 14 (fourteen) days.  Marland Kitchen aspirin 81 MG chewable tablet Chew 1 tablet (81 mg total) by mouth daily.  . Cholecalciferol (VITAMIN D3) 50 MCG (2000 UT) TABS Take 3 tablets by mouth daily.  . clopidogrel (PLAVIX) 75 MG tablet TAKE ONE TABLET BY MOUTH ONCE DAILY.  Marland Kitchen Coenzyme Q10 (CO Q-10) 400 MG CAPS Take 400 mg by mouth daily.  Marland Kitchen dexlansoprazole (DEXILANT) 60 MG capsule Take 60 mg by mouth daily.  Marland Kitchen dextromethorphan-guaiFENesin (MUCINEX DM) 30-600 MG 12hr tablet Take 1 tablet by mouth as needed.   . fexofenadine (ALLEGRA) 180  MG tablet Take 180 mg by mouth as needed for allergies or rhinitis. Rotates with Claritin.  Marland Kitchen glipiZIDE-metformin (METAGLIP) 5-500 MG tablet TAKE (1) TABLET BY MOUTH ONCE DAILY.  . isosorbide mononitrate (IMDUR) 60 MG 24 hr tablet Take 1 tablet (60 mg total) by mouth daily.  Marland Kitchen lisinopril (PRINIVIL,ZESTRIL) 5 MG tablet Take 5 mg by mouth daily.  . metoprolol succinate (TOPROL-XL) 25 MG 24 hr tablet TAKE 2 TABLETS BY MOUTH ONCE DAILY.  . nitroGLYCERIN (NITROSTAT) 0.4 MG SL tablet DISSOLVE 1 TABLET UNDER TONGUE EVERY 5 MINUTES UP TO 15 MIN FOR CHEST PAIN. IF NO RELIEF CALL 911.  . NP THYROID 60 MG tablet Take 1 tablet (60 mg total) by mouth daily before breakfast.  . ONETOUCH ULTRA test strip USE AS DIRECTED UP TO 3 TIMES DAILY IF NEEDED.  Marland Kitchen pantoprazole (PROTONIX) 40 MG tablet Take 40 mg by mouth as needed.   . ranolazine (RANEXA) 500 MG 12 hr tablet Take 500 mg by mouth 2 (two) times daily.  . sucralfate (CARAFATE) 1 g tablet Take 1 tablet (1 g total) by mouth 4 (four) times daily as needed.  . [DISCONTINUED] REPATHA SURECLICK 161 MG/ML SOAJ [id number] Milligram(s) SUB-Q  Every 2 Weeks     Flowsheet Row Office Visit from 10/02/2020 in [facility]  PHQ-9 Total Score 0      Objective:   Today's Vitals: BP 132/72   Pulse 68   Temp (!) 97.3 F (36.3 C) (Temporal)   Ht 6' (1.829 m)   Wt 215 lb 6.4 oz (97.7 kg)   SpO2 98%   BMI 29.21 kg/m  Vitals with BMI 10/02/2020 09/17/2020 08/21/2020  Height 6\' 0"  - 6\' 0"   Weight 215 lbs 6 oz 206 lbs 214 lbs  BMI 19.14 - 78.29  Systolic 562 130 865  Diastolic 72 79 74  Pulse 68 52 60     Physical Exam  He looks systemically well.  He has lost 2 pounds since the last time I saw him.  Blood pressure is excellent.  Examination of his right foot shows swelling of the right lateral malleolus with tenderness there.     Assessment   1. Acute right ankle pain   2. Essential hypertension   3. Non-insulin treated type 2 diabetes mellitus ([place])   4. Dyslipidemia       Tests ordered Orders Placed This Encounter  Procedures  . DG Foot Complete Right  . COMPLETE METABOLIC PANEL WITH GFR  . Hemoglobin A1c  . T3, free  . TSH     Plan: 1. I am going to send him for right foot x-ray to make sure he does not have a fracture.  I doubt that he does. 2. Continue with oral hypoglycemic agents for his diabetes and we will check an A1c. 3. Continue with NP thyroid 60 mg daily and we will check thyroid function. 4. Further recommendations will depend on blood results and I will see him in 3 months time for follow-up   No orders of the defined types were placed in this encounter.   [name], MD

## 2020-10-03 ENCOUNTER — Other Ambulatory Visit (INDEPENDENT_AMBULATORY_CARE_PROVIDER_SITE_OTHER): Payer: Self-pay | Admitting: Internal Medicine

## 2020-10-03 LAB — COMPLETE METABOLIC PANEL WITH GFR
AG Ratio: 1.3 (calc) (ref 1.0–2.5)
ALT: 13 U/L (ref 9–46)
AST: 15 U/L (ref 10–35)
Albumin: 3.8 g/dL (ref 3.6–5.1)
Alkaline phosphatase (APISO): 48 U/L (ref 35–144)
BUN/Creatinine Ratio: 10 (calc) (ref 6–22)
BUN: 12 mg/dL (ref 7–25)
CO2: 29 mmol/L (ref 20–32)
Calcium: 9.6 mg/dL (ref 8.6–10.3)
Chloride: 106 mmol/L (ref 98–110)
Creat: 1.21 mg/dL — ABNORMAL HIGH (ref 0.70–1.18)
GFR, Est African American: 68 mL/min/{1.73_m2} (ref >=60)
GFR, Est Non African American: 59 mL/min/{1.73_m2} — ABNORMAL LOW (ref >=60)
Globulin: 3 g/dL (calc) (ref 1.9–3.7)
Glucose, Bld: 143 mg/dL — ABNORMAL HIGH (ref 65–99)
Potassium: 3.9 mmol/L (ref 3.5–5.3)
Sodium: 140 mmol/L (ref 135–146)
Total Bilirubin: 0.8 mg/dL (ref 0.2–1.2)
Total Protein: 6.8 g/dL (ref 6.1–8.1)

## 2020-10-03 LAB — HEMOGLOBIN A1C
Hgb A1c MFr Bld: 6.6 % of total Hgb — ABNORMAL HIGH (ref <5.7)
Mean Plasma Glucose: 143 mg/dL
eAG (mmol/L): 7.9 mmol/L

## 2020-10-03 LAB — T3, FREE: T3, Free: 2.9 pg/mL (ref 2.3–4.2)

## 2020-10-03 LAB — TSH: TSH: 0.56 mIU/L (ref 0.40–4.50)

## 2020-10-03 MED ORDER — NP THYROID 90 MG PO TABS: 90.0000 mg | ORAL_TABLET | Freq: Every day | ORAL | 3 refills | Status: DC

## 2020-10-10 ENCOUNTER — Encounter: Payer: Self-pay | Admitting: Internal Medicine

## 2020-10-10 ENCOUNTER — Other Ambulatory Visit: Payer: Self-pay

## 2020-10-10 ENCOUNTER — Ambulatory Visit: Payer: PPO | Admitting: Nurse Practitioner

## 2020-10-10 ENCOUNTER — Encounter: Payer: Self-pay | Admitting: Nurse Practitioner

## 2020-10-10 VITALS — BP 140/78 | HR 66 | Temp 97.3°F | Ht 72.0 in | Wt 216.8 lb

## 2020-10-10 DIAGNOSIS — K5900 Constipation, unspecified: Secondary | ICD-10-CM | POA: Diagnosis not present

## 2020-10-10 DIAGNOSIS — K219 Gastro-esophageal reflux disease without esophagitis: Secondary | ICD-10-CM | POA: Diagnosis not present

## 2020-10-10 MED ORDER — DEXLANSOPRAZOLE 60 MG PO CPDR: 60.0000 mg | DELAYED_RELEASE_CAPSULE | Freq: Every day | ORAL | 3 refills | Status: DC

## 2020-10-10 NOTE — Progress Notes (Signed)
Referring Provider: [name], MD Primary Care Physician:  [name], MD Primary GI:  Dr. Abbey Chatters  Chief Complaint  Patient presents with  . Constipation    Linzess didn't help/ taking dulcolax/colace and it helps  . Gastroesophageal Reflux    Will have a flare of chest pain    HPI:   [name] is a 75 y.o. male who presents for follow-up on GERD and constipation.  The patient was last seen in our office 07/12/2020 for the same.  Noted history of anemia with hemoglobin approximately 12 and low ferritin 34, iron sat low at 13 on initial referral.  Folate and B12 unremarkable.  Colonoscopy in 2018 with pancolonic diverticulosis and nonbleeding internal hemorrhoids.  EGD in 2020 with erythematous mucosa in the stomach consistent with PPI effect.  Recommended update colonoscopy in 2021.  His updated colonoscopy found diverticulosis in the sigmoid and distal ascending colon's, single 5 mm polyp found to be tubular adenoma, cecal AVM ablated status post clipping.  Recommended no future colonoscopy unless new symptoms develop.  At his last visit noted worsening constipation on Colace 2 pills once daily that previously was effective but no longer.  Has tried Dulcolax which is briefly effective but then has the same problems afterward.  Worsening GERD previously well managed on Dexilant.  Avoid triggers.  Recent stress test normal and switched from a statin to Repatha with noted cholesterol and triglycerides at goal.  Recommended continue Dexilant, Carafate 1 g 4 times a day as needed, start Linzess 72 mcg, stop Colace, progress report in 1 to 2 weeks.  No progress report was called to our office.  Today states doing okay overall. He states Linzess didn't help his constipation. He tried to call our office but couldn't get through. He has started one colace and one Dulcolax every other day which is effective for him. Hasn't tried Colace daily. Has a flare of GERD symptoms typically  about every several months. When he has a flare it can last 4-5 days. Carafate helps his flares. Is taking Dexilant daily, needs a refill. Denies abdominal pain, N/V, hematochezia, melena, fever, chills, unintentional weight loss. Denies URI or flu-like symptoms. Denies loss of sense of taste or smell. The patient has received COVID-19 vaccination(s). They have also had a booster dose. Denies chest pain, dyspnea, dizziness, lightheadedness, syncope, near syncope. Denies any other upper or lower GI symptoms.  He does have Protonix which he will occasionally switch every several months, for about a month, to prevent tolerance to Dexilant.  Past Medical History:  Diagnosis Date  . CHF (congestive heart failure) ([place])   . Coronary artery disease 08/28/2010   s/p multiple caths 2012, BMS PCI OM1 on August 28, 2010-during NSTEMI; January 2019 non-STEMI- occlusion of OM stent (very late stent thrombosis), initial wire crossed with PTCA, but unable to rewire, PCI aborted--> plan medical therapy, normal EF  . Diabetes mellitus   . GERD (gastroesophageal reflux disease)   . Hyperlipidemia   . Hypertension   . Non-STEMI (non-ST elevated myocardial infarction) [facility]) January 2012 and 2019   a) Jan 2012: 99% OM1 - BMS PCI; b) Jan 2019: Very late stent thrombosis/100% OM1 -after initially causing him for repeat PTCA restoring flow, unable to recross to place stent. - >  Medical therapy.  Marland Kitchen PVD (peripheral vascular disease) ([place])    left SFA PTA & stenting in 02/2005 (Dr. Adora Fridge)  . Vitamin D deficiency disease 05/04/2019  Past Surgical History:  Procedure Laterality Date  . BIOPSY  08/02/2019   Procedure: BIOPSY;  Surgeon: Daneil Dolin, MD;  [facility];  Service: Endoscopy;;  gastric   . CARDIAC CATHETERIZATION  12/23/2004   normal L main, normal LAD, normal L Cfx, RCA with 20% hypodense lesion in first end of vessel (Dr. Adora Fridge)  . CARDIAC CATHETERIZATION  08/26/2007   no significant  CAD by cath, EF 50% (Dr. Jackie Plum)  . COLONOSCOPY  12/2009   Dr. Hampton Abbot  . COLONOSCOPY N/A 01/30/2017   pancolonic diverticulosis, non-bleeding internal hemorrhoids.  . COLONOSCOPY WITH PROPOFOL N/A 09/08/2019   Procedure: COLONOSCOPY WITH PROPOFOL;  Surgeon: Daneil Dolin, MD;  [facility];  Service: Endoscopy;  Laterality: N/A;  11:15am  . CORONARY BALLOON ANGIOPLASTY N/A 08/30/2017   Procedure: CORONARY BALLOON ANGIOPLASTY;  Surgeon: [name], MD;  Location: [place] [facility];  Service: Cardiovascular;  100% CTO very late stent thrombosis OM1 -> initially crossed with PTCA, but then unable to recross after losing my positioning.  PTCA ABORTED.  UNSUCCESSFUL ATTEMPT  . CORONARY BALLOON ANGIOPLASTY  02/10/2011   95% prox in-stent restenosis within OM stent - opened with cutting balloon (Dr. Corky Downs)  . CORONARY STENT INTERVENTION  07/17/2011   in-stent restenosis - re-stented with Promus 2.25x17mm DES (Dr. Roni Bread)  . CORONARY STENT INTERVENTION  08/28/2010   NSTEMI: OM1 99% BMS PCI 2.0x12mm MiniVision BMS (Dr. Adora Fridge)  . 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 ESOPHAGOGASTRODUODENOSCOPY N/A 08/02/2019   Procedure: ESOPHAGOGASTRODUODENOSCOPY (EGD);  Surgeon: Daneil Dolin, MD;  [facility];  Service: Endoscopy;  Laterality: N/A;  8:45am  . FEMORAL ARTERY STENT  02/27/2005   L SFA stenting - [place] down SFA across lesion - predilatation with 4x4 Powerflex, stenting with 7x4 Smart, post-dilatation with 6x4 powerflex (Dr. Adora Fridge)  . LEFT HEART CATH AND CORONARY ANGIOGRAPHY N/A 08/30/2017   Procedure: LEFT HEART CATH AND CORONARY ANGIOGRAPHY;  Surgeon: [name], MD;  Location: [place] [facility];  Service: Cardiovascular;  100% very late stent thrombosis of Overlapped BMS-DES OM1 -> attempted PTCA  . LEFT HEART CATH AND  CORONARY ANGIOGRAPHY  08/28/2010   NSTEMI: OM1 99% BMS PCI  (Dr. Adora Fridge)  . LEFT HEART CATH AND CORONARY ANGIOGRAPHY  09/11/2010   patent stent (Dr. Roni Bread)  . LEFT HEART CATH AND CORONARY ANGIOGRAPHY  02/10/2011   95% prox in-stent restenosis within OM stent  (Dr. Corky Downs)  . LEFT HEART CATHETERIZATION WITH CORONARY ANGIOGRAM N/A 07/17/2011   Procedure: LEFT HEART CATHETERIZATION WITH CORONARY ANGIOGRAM;  Surgeon: Leonie Man, MD;  Location: [facility];  Service: Cardiovascular;  Laterality: N/A;  Right radial approach;  90% ISR of BMS (5 months post PTCA for ISR) --> DES PCI  . left knee arthroscopy  05/2016  . NM MYOCAR PERF WALL MOTION  09/08/2013   abnormal lexiscan - low to intermediate risk;   . POLYPECTOMY  09/08/2019   Procedure: POLYPECTOMY;  Surgeon: Daneil Dolin, MD;  [facility];  Service: Endoscopy;;  . TOOTH EXTRACTION Right 06/19/2020  . TRANSTHORACIC ECHOCARDIOGRAM  09/01/2017   Normal LV size and function.  EF 66 5%.  Normal wall motion.  GR 1 DD.  Mild aortic sclerosis.  Aortic root mildly dilated at 40 mm.    Current Outpatient Medications  Medication  Sig Dispense Refill  . acetaminophen (TYLENOL) 500 MG tablet Take 1,000 mg by mouth as needed for moderate pain.     . Alirocumab (PRALUENT) 75 MG/ML SOAJ Inject 1 Dose into the skin every 14 (fourteen) days.    Marland Kitchen aspirin 81 MG chewable tablet Chew 1 tablet (81 mg total) by mouth daily.    . bisacodyl (DULCOLAX) 5 MG EC tablet Take 5 mg by mouth every other day.    . Cholecalciferol (VITAMIN D3) 50 MCG (2000 UT) TABS Take 3 tablets by mouth daily.    . clopidogrel (PLAVIX) 75 MG tablet TAKE ONE TABLET BY MOUTH ONCE DAILY. 30 tablet 9  . Coenzyme Q10 (CO Q-10) 400 MG CAPS Take 400 mg by mouth daily.    Marland Kitchen dexlansoprazole (DEXILANT) 60 MG capsule Take 1 capsule (60 mg total) by mouth daily. 90 capsule 3  . dextromethorphan-guaiFENesin (MUCINEX DM) 30-600 MG 12hr tablet Take 1 tablet by mouth as needed.      . docusate sodium (COLACE) 100 MG capsule Take 100 mg by mouth every other day.    . fexofenadine (ALLEGRA) 180 MG tablet Take 180 mg by mouth as needed for allergies or rhinitis. Rotates with Claritin.    Marland Kitchen glipiZIDE-metformin (METAGLIP) 5-500 MG tablet TAKE (1) TABLET BY MOUTH ONCE DAILY. 90 tablet 0  . isosorbide mononitrate (IMDUR) 60 MG 24 hr tablet Take 1 tablet (60 mg total) by mouth daily. 90 tablet 3  . lisinopril (PRINIVIL,ZESTRIL) 5 MG tablet Take 5 mg by mouth daily.    . metoprolol succinate (TOPROL-XL) 25 MG 24 hr tablet TAKE 2 TABLETS BY MOUTH ONCE DAILY. 180 tablet 3  . nitroGLYCERIN (NITROSTAT) 0.4 MG SL tablet DISSOLVE 1 TABLET UNDER TONGUE EVERY 5 MINUTES UP TO 15 MIN FOR CHEST PAIN. IF NO RELIEF CALL 911. 25 tablet 0  . NP THYROID 90 MG tablet Take 1 tablet (90 mg total) by mouth daily. 30 tablet 3  . ONETOUCH ULTRA test strip USE AS DIRECTED UP TO 3 TIMES DAILY IF NEEDED. 100 strip 3  . pantoprazole (PROTONIX) 40 MG tablet Take 40 mg by mouth as needed.     . ranolazine (RANEXA) 500 MG 12 hr tablet Take 500 mg by mouth 2 (two) times daily.    . sucralfate (CARAFATE) 1 g tablet Take 1 tablet (1 g total) by mouth 4 (four) times daily as needed. 90 tablet 1   No current facility-administered medications for this visit.    Allergies as of 10/10/2020 - Review Complete 10/10/2020  Allergen Reaction Noted  . Crestor [rosuvastatin]  04/26/2020  . Propoxyphene n-acetaminophen Nausea Only   . Statins Other (See Comments) 07/29/2011  . Tape  07/29/2011  . Zetia [ezetimibe]  08/30/2017  . Zocor [simvastatin]  04/26/2020  . Flomax [tamsulosin hcl] Rash 08/16/2014  . Levaquin [levofloxacin hemihydrate] Rash 07/29/2011  . Penicillins Rash     Family History  Problem Relation Age of Onset  . Arrhythmia Mother 22  . Colon cancer Neg Hx   . Liver disease Neg Hx   . Inflammatory bowel disease Neg Hx   . Colon polyps Neg Hx     Social History   Socioeconomic History  .  Marital status: Married    Spouse name: Not on file  . Number of children: 3  . Years of education: Not on file  . Highest education level: Not on file  Occupational History  . Occupation: Retired    Fish farm manager: [employer]  Tobacco Use  .  Smoking status: Former Smoker    Years: 50.00    Quit date: 05/12/1999    Years since quitting: 21.4  . Smokeless tobacco: Never Used  Vaping Use  . Vaping Use: Never used  Substance and Sexual Activity  . Alcohol use: No  . Drug use: No  . Sexual activity: Never  Other Topics Concern  . 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   Social Determinants of Health   Financial Resource Strain: Not on file  Food Insecurity: Not on file  Transportation Needs: Not on file  Physical Activity: Not on file  Stress: Not on file  Social Connections: Not on file    Subjective: Review of Systems  Constitutional: Negative for chills, fever, malaise/fatigue and weight loss.  HENT: Negative for congestion and sore throat.   Respiratory: Negative for cough and shortness of breath.   Cardiovascular: Negative for chest pain and palpitations.  Gastrointestinal: Positive for constipation (improved on medications) and heartburn. Negative for abdominal pain, blood in stool, diarrhea, melena, nausea (rare) and vomiting.  Musculoskeletal: Negative for joint pain and myalgias.  Skin: Negative for rash.  Neurological: Negative for dizziness and weakness.  Endo/Heme/Allergies: Does not bruise/bleed easily.  Psychiatric/Behavioral: Negative for depression. The patient is not nervous/anxious.   All other systems reviewed and are negative.    Objective: BP 140/78   Pulse 66   Temp (!) 97.3 F (36.3 C)   Ht 6' (1.829 m)   Wt 216 lb 12.8 oz (98.3 kg)   BMI 29.40 kg/m  Physical Exam Vitals and nursing note reviewed.  Constitutional:      General: He is not in  acute distress.    Appearance: Normal appearance. He is normal weight. He is not ill-appearing, toxic-appearing or diaphoretic.  HENT:     Head: Normocephalic and atraumatic.     Nose: No congestion or rhinorrhea.  Eyes:     General: No scleral icterus. Cardiovascular:     Rate and Rhythm: Normal rate and regular rhythm.     Heart sounds: Normal heart sounds.  Pulmonary:     Effort: Pulmonary effort is normal.     Breath sounds: Normal breath sounds.  Abdominal:     General: Bowel sounds are normal. There is no distension.     Palpations: Abdomen is soft. There is no hepatomegaly, splenomegaly or mass.     Tenderness: There is no abdominal tenderness. There is no guarding or rebound.     Hernia: No hernia is present.  Musculoskeletal:     Cervical back: Neck supple.  Skin:    General: Skin is warm and dry.     Coloration: Skin is not jaundiced.     Findings: No bruising or rash.  Neurological:     General: No focal deficit present.     Mental Status: He is alert and oriented to person, place, and time. Mental status is at baseline.  Psychiatric:        Mood and Affect: Mood normal.        Behavior: Behavior normal.        Thought Content: Thought content normal.      Assessment:  Very pleasant 75 year old male presents for follow-up on GERD and constipation. Overall he is doing well clinically. No red flag/warning signs or symptoms today  GERD: Doing well on Dexilant. Every several months he will switch to Protonix for 1 month or so to "prevent  resistance to Dexilant". He rarely has flares, about every 3 to 4 months. Tums and/or Carafate helps his flares. Overall he is doing well related to GERD. Recommend he continue his current medications  Constipation: Linzess 72 mcg daily was not very effective for him. He has been taking Colace and Dulcolax each once a day, every other day which does well for him. To try to reduce his dependence on stimulant laxatives I recommended he  try increasing Colace to once a day, or even twice a day with Dulcolax less often/as needed. He states he will give this a try. He is to call us with any problems   Plan: 1. Continue current medications 2. Increase Colace to once daily or twice daily 3. Reduce Dulcolax to as needed 4. Call for any worsening or severe symptoms 5. Follow-up in 6 months    Thank you for allowing [place] to participate in the care of [name]  Walden Field, DNP, AGNP-C Adult & Gerontological Nurse Practitioner [facility]   10/10/2020 11:10 AM   Disclaimer: This note was dictated with voice recognition software. Similar sounding words can inadvertently be transcribed and may not be corrected upon review.

## 2020-10-10 NOTE — Progress Notes (Signed)
Cc'ed to pcp °

## 2020-10-10 NOTE — Patient Instructions (Signed)
Your health issues we discussed today were:   GERD (reflux/heartburn): 1. I am glad you are doing well this! 2. Continue taking your current medications 3. You can use Carafate up to 3 times a day as needed for flare 4. You can also use Tums, Rolaids, or other similar over-the-counter medications to help when you are having a flare 5. Call for any worsening or severe symptoms that are ongoing  Constipation: 1. As we discussed, increase the dose of Colace. He can take this once a day, or even twice a day if needed 2. The goal is to reduce using Dulcolax to as little as possible, as needed 3. Call us for any worsening or severe symptoms 4. We can always try another prescription medicine in the future if this current regimen does not do well for you ongoing  Overall I recommend:  1. Continue your other current medications 2. Return for follow-up in 6 months 3. Call us for any questions or concerns   ---------------------------------------------------------------  I am glad you have gotten your COVID-19 vaccination!  Even though you are fully vaccinated you should continue to follow CDC and state/local guidelines.  ---------------------------------------------------------------   At [facility] we value your feedback. You may receive a survey about your visit today. Please share your experience as we strive to create trusting relationships with our patients to provide genuine, compassionate, quality care.  We appreciate your understanding and patience as we review any laboratory studies, imaging, and other diagnostic tests that are ordered as we care for you. Our office policy is 5 business days for review of these results, and any emergent or urgent results are addressed in a timely manner for your best interest. If you do not hear from our office in 1 week, please contact us.   We also encourage the use of MyChart, which contains your medical information for your review  as well. If you are not enrolled in this feature, an access code is on this after visit summary for your convenience. Thank you for allowing [place] to be involved in your care.  It was great to see you today!  I hope you have a great spring!!

## 2020-10-23 DIAGNOSIS — H2513 Age-related nuclear cataract, bilateral: Secondary | ICD-10-CM | POA: Diagnosis not present

## 2020-10-23 DIAGNOSIS — H40013 Open angle with borderline findings, low risk, bilateral: Secondary | ICD-10-CM | POA: Diagnosis not present

## 2020-10-23 DIAGNOSIS — E119 Type 2 diabetes mellitus without complications: Secondary | ICD-10-CM | POA: Diagnosis not present

## 2020-10-23 LAB — HM DIABETES EYE EXAM

## 2020-10-26 ENCOUNTER — Ambulatory Visit
Admission: EM | Admit: 2020-10-26 | Discharge: 2020-10-26 | Disposition: A | Discharge status: Home / Self Care | Payer: HMO | Attending: Emergency Medicine | Admitting: Emergency Medicine

## 2020-10-26 ENCOUNTER — Other Ambulatory Visit: Payer: Self-pay

## 2020-10-26 DIAGNOSIS — L02414 Cutaneous abscess of left upper limb: Secondary | ICD-10-CM

## 2020-10-26 MED ORDER — DOXYCYCLINE HYCLATE 100 MG PO CAPS: 100.0000 mg | ORAL_CAPSULE | Freq: Two times a day (BID) | ORAL | 0 refills | Status: DC

## 2020-10-26 NOTE — ED Provider Notes (Signed)
[place]   [id number] 10/26/20 Arrival Time: 1603   [id number]  SUBJECTIVE:  [name] is a 75 y.o. male who presents with a possible abscess of his LT shoulder x 8 days. Denies precipitating event or trauma.  Tried popping at home without relief.  Sore to the touch.  Reports swelling, redness, and purulent drainage.  Denies fever, chills, nausea, vomiting.    ROS: As per HPI.  All other pertinent ROS negative.     Past Medical History:  Diagnosis Date  . CHF (congestive heart failure) ([place])   . Coronary artery disease 08/28/2010   s/p multiple caths 2012, BMS PCI OM1 on August 28, 2010-during NSTEMI; January 2019 non-STEMI- occlusion of OM stent (very late stent thrombosis), initial wire crossed with PTCA, but unable to rewire, PCI aborted--> plan medical therapy, normal EF  . Diabetes mellitus   . GERD (gastroesophageal reflux disease)   . Hyperlipidemia   . Hypertension   . Non-STEMI (non-ST elevated myocardial infarction) [facility]) January 2012 and 2019   a) Jan 2012: 99% OM1 - BMS PCI; b) Jan 2019: Very late stent thrombosis/100% OM1 -after initially causing him for repeat PTCA restoring flow, unable to recross to place stent. - >  Medical therapy.  Marland Kitchen PVD (peripheral vascular disease) ([place])    left SFA PTA & stenting in 02/2005 (Dr. Adora Fridge)  . Vitamin D deficiency disease 05/04/2019   Past Surgical History:  Procedure Laterality Date  . BIOPSY  08/02/2019   Procedure: BIOPSY;  Surgeon: Daneil Dolin, MD;  [facility];  Service: Endoscopy;;  gastric   . CARDIAC CATHETERIZATION  12/23/2004   normal L main, normal LAD, normal L Cfx, RCA with 20% hypodense lesion in first end of vessel (Dr. Adora Fridge)  . CARDIAC CATHETERIZATION  08/26/2007   no significant CAD by cath, EF 50% (Dr. Jackie Plum)  . COLONOSCOPY  12/2009   Dr. Hampton Abbot  . COLONOSCOPY N/A 01/30/2017   pancolonic diverticulosis, non-bleeding internal hemorrhoids.  . COLONOSCOPY WITH  PROPOFOL N/A 09/08/2019   Procedure: COLONOSCOPY WITH PROPOFOL;  Surgeon: Daneil Dolin, MD;  [facility];  Service: Endoscopy;  Laterality: N/A;  11:15am  . CORONARY BALLOON ANGIOPLASTY N/A 08/30/2017   Procedure: CORONARY BALLOON ANGIOPLASTY;  Surgeon: [name], MD;  Location: [place] [facility];  Service: Cardiovascular;  100% CTO very late stent thrombosis OM1 -> initially crossed with PTCA, but then unable to recross after losing my positioning.  PTCA ABORTED.  UNSUCCESSFUL ATTEMPT  . CORONARY BALLOON ANGIOPLASTY  02/10/2011   95% prox in-stent restenosis within OM stent - opened with cutting balloon (Dr. Corky Downs)  . CORONARY STENT INTERVENTION  07/17/2011   in-stent restenosis - re-stented with Promus 2.25x66mm DES (Dr. Roni Bread)  . CORONARY STENT INTERVENTION  08/28/2010   NSTEMI: OM1 99% BMS PCI 2.0x20mm MiniVision BMS (Dr. Adora Fridge)  . 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 ESOPHAGOGASTRODUODENOSCOPY N/A 08/02/2019   Procedure: ESOPHAGOGASTRODUODENOSCOPY (EGD);  Surgeon: Daneil Dolin, MD;  [facility];  Service: Endoscopy;  Laterality: N/A;  8:45am  . FEMORAL ARTERY STENT  02/27/2005   L SFA stenting - Wholey down SFA across lesion - predilatation with 4x4 Powerflex, stenting with 7x4 Smart, post-dilatation with 6x4 powerflex (Dr. Adora Fridge)  . LEFT HEART CATH AND CORONARY ANGIOGRAPHY N/A 08/30/2017   Procedure: LEFT HEART CATH AND CORONARY ANGIOGRAPHY;  Surgeon: [name], MD;  Location: [place] [facility];  Service: Cardiovascular;  100% very late stent thrombosis of Overlapped BMS-DES OM1 -> attempted PTCA  . LEFT HEART CATH AND CORONARY ANGIOGRAPHY  08/28/2010   NSTEMI: OM1 99% BMS PCI  (Dr. Adora Fridge)  . LEFT HEART CATH AND CORONARY ANGIOGRAPHY  09/11/2010   patent stent (Dr. Roni Bread)  . LEFT HEART CATH AND CORONARY  ANGIOGRAPHY  02/10/2011   95% prox in-stent restenosis within OM stent  (Dr. Corky Downs)  . LEFT HEART CATHETERIZATION WITH CORONARY ANGIOGRAM N/A 07/17/2011   Procedure: LEFT HEART CATHETERIZATION WITH CORONARY ANGIOGRAM;  Surgeon: Leonie Man, MD;  Location: [facility] CATH LAB;  Service: Cardiovascular;  Laterality: N/A;  Right radial approach;  90% ISR of BMS (5 months post PTCA for ISR) --> DES PCI  . left knee arthroscopy  05/2016  . NM MYOCAR PERF WALL MOTION  09/08/2013   abnormal lexiscan - low to intermediate risk;   . POLYPECTOMY  09/08/2019   Procedure: POLYPECTOMY;  Surgeon: Daneil Dolin, MD;  [facility];  Service: Endoscopy;;  . TOOTH EXTRACTION Right 06/19/2020  . TRANSTHORACIC ECHOCARDIOGRAM  09/01/2017   Normal LV size and function.  EF 66 5%.  Normal wall motion.  GR 1 DD.  Mild aortic sclerosis.  Aortic root mildly dilated at 40 mm.   Allergies  Allergen Reactions  . Crestor [Rosuvastatin]     myalgia  . Propoxyphene N-Acetaminophen Nausea Only  . Statins Other (See Comments)    Severe muscle cramping/aching/pain/ elevated CK  . Tape     Blisters  . Zetia [Ezetimibe]     Muscle cramping  . Zocor [Simvastatin]     myalgia  . Flomax [Tamsulosin Hcl] Rash  . Levaquin [Levofloxacin Hemihydrate] Rash  . Penicillins Rash    Broke out in rash 6 years ago, pt recently took penicillin (09/2016) and had no reaction Has patient had a PCN reaction causing immediate rash, facial/tongue/throat swelling, SOB or lightheadedness with hypotension: Yes Has patient had a PCN reaction causing severe rash involving mucus membranes or skin necrosis: Unknown Has patient had a PCN reaction that required hospitalization: No Has patient had a PCN reaction occurring within the last 10 years: Yes If all of the above answers are "NO", then m   No current facility-administered medications on file prior to encounter.   Current Outpatient Medications on File Prior to Encounter   Medication Sig Dispense Refill  . acetaminophen (TYLENOL) 500 MG tablet Take 1,000 mg by mouth as needed for moderate pain.     . Alirocumab (PRALUENT) 75 MG/ML SOAJ Inject 1 Dose into the skin every 14 (fourteen) days.    Marland Kitchen aspirin 81 MG chewable tablet Chew 1 tablet (81 mg total) by mouth daily.    . bisacodyl (DULCOLAX) 5 MG EC tablet Take 5 mg by mouth every other day.    . Cholecalciferol (VITAMIN D3) 50 MCG (2000 UT) TABS Take 3 tablets by mouth daily.    . clopidogrel (PLAVIX) 75 MG tablet TAKE ONE TABLET BY MOUTH ONCE DAILY. 30 tablet 9  . Coenzyme Q10 (CO Q-10) 400 MG CAPS Take 400 mg by mouth daily.    Marland Kitchen dexlansoprazole (DEXILANT) 60 MG capsule Take 1 capsule (60 mg total) by mouth daily. 90 capsule 3  . dextromethorphan-guaiFENesin (MUCINEX DM) 30-600 MG 12hr tablet Take 1 tablet by mouth as needed.     . docusate sodium (COLACE) 100 MG capsule Take 100  mg by mouth every other day.    . fexofenadine (ALLEGRA) 180 MG tablet Take 180 mg by mouth as needed for allergies or rhinitis. Rotates with Claritin.    Marland Kitchen glipiZIDE-metformin (METAGLIP) 5-500 MG tablet TAKE (1) TABLET BY MOUTH ONCE DAILY. 90 tablet 0  . isosorbide mononitrate (IMDUR) 60 MG 24 hr tablet Take 1 tablet (60 mg total) by mouth daily. 90 tablet 3  . lisinopril (PRINIVIL,ZESTRIL) 5 MG tablet Take 5 mg by mouth daily.    . metoprolol succinate (TOPROL-XL) 25 MG 24 hr tablet TAKE 2 TABLETS BY MOUTH ONCE DAILY. 180 tablet 3  . nitroGLYCERIN (NITROSTAT) 0.4 MG SL tablet DISSOLVE 1 TABLET UNDER TONGUE EVERY 5 MINUTES UP TO 15 MIN FOR CHEST PAIN. IF NO RELIEF CALL 911. 25 tablet 0  . NP THYROID 90 MG tablet Take 1 tablet (90 mg total) by mouth daily. 30 tablet 3  . ONETOUCH ULTRA test strip USE AS DIRECTED UP TO 3 TIMES DAILY IF NEEDED. 100 strip 3  . pantoprazole (PROTONIX) 40 MG tablet Take 40 mg by mouth as needed.     . ranolazine (RANEXA) 500 MG 12 hr tablet Take 500 mg by mouth 2 (two) times daily.    . sucralfate  (CARAFATE) 1 g tablet Take 1 tablet (1 g total) by mouth 4 (four) times daily as needed. 90 tablet 1   Social History   Socioeconomic History  . Marital status: Married    Spouse name: Not on file  . Number of children: 3  . Years of education: Not on file  . Highest education level: Not on file  Occupational History  . Occupation: Retired    Fish farm manager: [employer]  Tobacco Use  . Smoking status: Former Smoker    Years: 50.00    Quit date: 05/12/1999    Years since quitting: 21.4  . Smokeless tobacco: Never Used  Vaping Use  . Vaping Use: Never used  Substance and Sexual Activity  . Alcohol use: No  . Drug use: No  . Sexual activity: Never  Other Topics Concern  . 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   Social Determinants of Health   Financial Resource Strain: Not on file  Food Insecurity: Not on file  Transportation Needs: Not on file  Physical Activity: Not on file  Stress: Not on file  Social Connections: Not on file  Intimate Partner Violence: Not on file   Family History  Problem Relation Age of Onset  . Arrhythmia Mother 20  . Colon cancer Neg Hx   . Liver disease Neg Hx   . Inflammatory bowel disease Neg Hx   . Colon polyps Neg Hx     OBJECTIVE:  Vitals:   10/26/20 1612  BP: 130/74  Pulse: 93  Resp: 17  Temp: 98.3 F (36.8 C)  TempSrc: Oral  SpO2: 94%     General appearance: alert; no distress Skin: 2-3 cm induration of his LT superior posterior shoulder; tender to touch; no active drainage Psychological: alert and cooperative; normal mood and affect  Procedure: Verbal consent obtained. Area over induration cleaned with betadine. Lidocaine 2% with epinephrine used to obtain local anesthesia. The most fluctuant portion of the abscess was incised with a #11 blade scalpel. Abscess cavity explored and evacuated. Loculations broken up with a  curved hemostat as best as possible given patient discomfort. Cavity packed with packing material and dressed with a  clean gauze dressing. Minimal bleeding. No complications.  ASSESSMENT & PLAN:  1. Abscess of skin of left shoulder     Meds ordered this encounter  Medications  . doxycycline (VIBRAMYCIN) 100 MG capsule    Sig: Take 1 capsule (100 mg total) by mouth 2 (two) times daily.    Dispense:  20 capsule    Refill:  0    Order Specific Question:   Supervising Provider    Answer:   Raylene Everts [[id number]]   Keep dry and covered for next 24-48 hours Remove packing in 48 hours either at home or return here After packing is removed in you may then begin appling warm compresses 3-4x daily for 10-15 minutes.  You may then wash site daily with warm water and mild soap Keep covered to avoid friction Take antibiotic as prescribed and to completion Return sooner or go to the ED if you have any new or worsening symptoms such as increased redness, swelling, pain, nausea, vomiting, fever, chills, etc...   Reviewed expectations re: course of current medical issues. Questions answered. Outlined signs and symptoms indicating need for more acute intervention. Patient verbalized understanding. After Visit Summary given.          Lestine Box, PA-C 10/26/20 1705

## 2020-10-26 NOTE — ED Triage Notes (Signed)
Pt presents with abscess to left shoulder that developed about a week ago

## 2020-10-26 NOTE — Discharge Instructions (Signed)
Keep dry and covered for next 24-48 hours Remove packing in 48 hours either at home or return here After packing is removed in you may then begin appling warm compresses 3-4x daily for 10-15 minutes.  You may then wash site daily with warm water and mild soap Keep covered to avoid friction Take antibiotic as prescribed and to completion Return sooner or go to the ED if you have any new or worsening symptoms such as increased redness, swelling, pain, nausea, vomiting, fever, chills, etc..Marland Kitchen

## 2020-11-21 ENCOUNTER — Other Ambulatory Visit (INDEPENDENT_AMBULATORY_CARE_PROVIDER_SITE_OTHER): Payer: Self-pay | Admitting: Internal Medicine

## 2020-11-26 ENCOUNTER — Encounter: Payer: Self-pay | Admitting: Gastroenterology

## 2021-01-02 ENCOUNTER — Encounter (INDEPENDENT_AMBULATORY_CARE_PROVIDER_SITE_OTHER): Payer: Self-pay | Admitting: Internal Medicine

## 2021-01-02 ENCOUNTER — Ambulatory Visit (INDEPENDENT_AMBULATORY_CARE_PROVIDER_SITE_OTHER): Payer: HMO | Admitting: Internal Medicine

## 2021-01-02 ENCOUNTER — Other Ambulatory Visit: Payer: Self-pay

## 2021-01-02 VITALS — BP 128/66 | HR 62 | Temp 97.7°F | Resp 18 | Ht 72.0 in | Wt 216.6 lb

## 2021-01-02 DIAGNOSIS — Z9861 Coronary angioplasty status: Secondary | ICD-10-CM | POA: Diagnosis not present

## 2021-01-02 DIAGNOSIS — I2510 Atherosclerotic heart disease of native coronary artery without angina pectoris: Secondary | ICD-10-CM

## 2021-01-02 DIAGNOSIS — E119 Type 2 diabetes mellitus without complications: Secondary | ICD-10-CM | POA: Diagnosis not present

## 2021-01-02 DIAGNOSIS — I10 Essential (primary) hypertension: Secondary | ICD-10-CM | POA: Diagnosis not present

## 2021-01-02 DIAGNOSIS — E785 Hyperlipidemia, unspecified: Secondary | ICD-10-CM | POA: Diagnosis not present

## 2021-01-02 NOTE — Progress Notes (Signed)
Metrics: Intervention Frequency ACO  Documented Smoking Status Yearly  Screened one or more times in 24 months  Cessation Counseling or  Active cessation medication Past 24 months  Past 24 months   Guideline developer: UpToDate (See UpToDate for funding source) Date Released: 2014       Wellness Office Visit  Subjective:  Patient ID: [name], male    DOB: [birth year]  Age: 75 y.o. MRN: [id number]  CC: This delightful man comes in for follow-up of hypertension, diabetes, dyslipidemia with a history of coronary artery disease. HPI  He has been on desiccated NP thyroid for symptoms of thyroid hypofunction and has tolerated the higher dose that I placed him on last time. He continues to focus on eating healthy and exercising and keeping active. He continues on oral hypoglycemic agents for his diabetes.  His last hemoglobin A1c was 6.6% which was a large improvement from the previous year. He denies any chest pain, dyspnea, palpitations or limb weakness. Past Medical History:  Diagnosis Date  . CHF (congestive heart failure) ([place])   . Coronary artery disease 08/28/2010   s/p multiple caths 2012, BMS PCI OM1 on August 28, 2010-during NSTEMI; January 2019 non-STEMI- occlusion of OM stent (very late stent thrombosis), initial wire crossed with PTCA, but unable to rewire, PCI aborted--> plan medical therapy, normal EF  . Diabetes mellitus   . GERD (gastroesophageal reflux disease)   . Hyperlipidemia   . Hypertension   . Non-STEMI (non-ST elevated myocardial infarction) [facility]) January 2012 and 2019   a) Jan 2012: 99% OM1 - BMS PCI; b) Jan 2019: Very late stent thrombosis/100% OM1 -after initially causing him for repeat PTCA restoring flow, unable to recross to place stent. - >  Medical therapy.  Marland Kitchen PVD (peripheral vascular disease) ([place])    left SFA PTA & stenting in 02/2005 (Dr. Adora Fridge)  . Vitamin D deficiency disease 05/04/2019   Past Surgical History:  Procedure Laterality Date  .  BIOPSY  08/02/2019   Procedure: BIOPSY;  Surgeon: Daneil Dolin, MD;  [facility];  Service: Endoscopy;;  gastric   . CARDIAC CATHETERIZATION  12/23/2004   normal L main, normal LAD, normal L Cfx, RCA with 20% hypodense lesion in first end of vessel (Dr. Adora Fridge)  . CARDIAC CATHETERIZATION  08/26/2007   no significant CAD by cath, EF 50% (Dr. Jackie Plum)  . COLONOSCOPY  12/2009   Dr. Hampton Abbot  . COLONOSCOPY N/A 01/30/2017   pancolonic diverticulosis, non-bleeding internal hemorrhoids.  . COLONOSCOPY WITH PROPOFOL N/A 09/08/2019   Procedure: COLONOSCOPY WITH PROPOFOL;  Surgeon: Daneil Dolin, MD;  [facility];  Service: Endoscopy;  Laterality: N/A;  11:15am  . CORONARY BALLOON ANGIOPLASTY N/A 08/30/2017   Procedure: CORONARY BALLOON ANGIOPLASTY;  Surgeon: [name], MD;  Location: [place] CV LAB;  Service: Cardiovascular;  100% CTO very late stent thrombosis OM1 -> initially crossed with PTCA, but then unable to recross after losing my positioning.  PTCA ABORTED.  UNSUCCESSFUL ATTEMPT  . CORONARY BALLOON ANGIOPLASTY  02/10/2011   95% prox in-stent restenosis within OM stent - opened with cutting balloon (Dr. Corky Downs)  . CORONARY STENT INTERVENTION  07/17/2011   in-stent restenosis - re-stented with Promus 2.25x33mm DES (Dr. Roni Bread)  . CORONARY STENT INTERVENTION  08/28/2010   NSTEMI: OM1 99% BMS PCI 2.0x18mm MiniVision BMS (Dr. Adora Fridge)  . 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 ESOPHAGOGASTRODUODENOSCOPY N/A 08/02/2019   Procedure: ESOPHAGOGASTRODUODENOSCOPY (EGD);  Surgeon: Daneil Dolin, MD;  [facility];  Service: Endoscopy;  Laterality: N/A;  8:45am  . FEMORAL ARTERY STENT  02/27/2005   L SFA stenting - Wholey down SFA across lesion - predilatation with 4x4 Powerflex, stenting with 7x4 Smart, post-dilatation  with 6x4 powerflex (Dr. Adora Fridge)  . LEFT HEART CATH AND CORONARY ANGIOGRAPHY N/A 08/30/2017   Procedure: LEFT HEART CATH AND CORONARY ANGIOGRAPHY;  Surgeon: [name], MD;  Location: [place] [facility];  Service: Cardiovascular;  100% very late stent thrombosis of Overlapped BMS-DES OM1 -> attempted PTCA  . LEFT HEART CATH AND CORONARY ANGIOGRAPHY  08/28/2010   NSTEMI: OM1 99% BMS PCI  (Dr. Adora Fridge)  . LEFT HEART CATH AND CORONARY ANGIOGRAPHY  09/11/2010   patent stent (Dr. Roni Bread)  . LEFT HEART CATH AND CORONARY ANGIOGRAPHY  02/10/2011   95% prox in-stent restenosis within OM stent  (Dr. Corky Downs)  . LEFT HEART CATHETERIZATION WITH CORONARY ANGIOGRAM N/A 07/17/2011   Procedure: LEFT HEART CATHETERIZATION WITH CORONARY ANGIOGRAM;  Surgeon: Leonie Man, MD;  Location: [facility] CATH LAB;  Service: Cardiovascular;  Laterality: N/A;  Right radial approach;  90% ISR of BMS (5 months post PTCA for ISR) --> DES PCI  . left knee arthroscopy  05/2016  . NM MYOCAR PERF WALL MOTION  09/08/2013   abnormal lexiscan - low to intermediate risk;   . POLYPECTOMY  09/08/2019   Procedure: POLYPECTOMY;  Surgeon: Daneil Dolin, MD;  [facility];  Service: Endoscopy;;  . TOOTH EXTRACTION Right 06/19/2020  . TRANSTHORACIC ECHOCARDIOGRAM  09/01/2017   Normal LV size and function.  EF 66 5%.  Normal wall motion.  GR 1 DD.  Mild aortic sclerosis.  Aortic root mildly dilated at 40 mm.     Family History  Problem Relation Age of Onset  . Arrhythmia Mother 27  . Colon cancer Neg Hx   . Liver disease Neg Hx   . Inflammatory bowel disease Neg Hx   . 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   Social History   Tobacco Use  . Smoking status: Former Smoker    Years: 50.00    Quit date: 05/12/1999    Years since quitting: 21.6  . Smokeless tobacco: Never Used   Substance Use Topics  . Alcohol use: No    Current Meds  Medication Sig  . acetaminophen (TYLENOL) 500 MG tablet Take 1,000 mg by mouth as needed for moderate pain.   . Alirocumab (PRALUENT) 75 MG/ML SOAJ Inject 1 Dose into the skin every 14 (fourteen) days.  Marland Kitchen aspirin 81 MG chewable tablet Chew 1 tablet (81 mg total) by mouth daily.  . bisacodyl (DULCOLAX) 5 MG EC tablet Take 5 mg by mouth every other day.  . Cholecalciferol (VITAMIN D3) 50 MCG (2000 UT) TABS Take 3 tablets by mouth daily.  . clopidogrel (PLAVIX) 75 MG tablet TAKE ONE TABLET BY MOUTH ONCE DAILY.  . cyclobenzaprine (FLEXERIL) 10 MG tablet Take 1 tablet by mouth at bedtime.  Marland Kitchen dexlansoprazole (DEXILANT) 60 MG capsule Take 1 capsule (60 mg total) by mouth daily.  Marland Kitchen dextromethorphan-guaiFENesin (MUCINEX DM) 30-600 MG 12hr tablet Take 1 tablet by mouth as needed.   . diclofenac Sodium (VOLTAREN) 1 % GEL Apply topically.  Marland Kitchen  docusate sodium (COLACE) 100 MG capsule Take 100 mg by mouth every other day.  . fexofenadine (ALLEGRA) 180 MG tablet Take 180 mg by mouth as needed for allergies or rhinitis. Rotates with Claritin.  Marland Kitchen glipiZIDE-metformin (METAGLIP) 5-500 MG tablet TAKE (1) TABLET BY MOUTH ONCE DAILY.  . isosorbide mononitrate (IMDUR) 60 MG 24 hr tablet Take 1 tablet (60 mg total) by mouth daily.  Marland Kitchen lisinopril (PRINIVIL,ZESTRIL) 5 MG tablet Take 5 mg by mouth daily.  . metoprolol succinate (TOPROL-XL) 25 MG 24 hr tablet TAKE 2 TABLETS BY MOUTH ONCE DAILY.  . nitroGLYCERIN (NITROSTAT) 0.4 MG SL tablet DISSOLVE 1 TABLET UNDER TONGUE EVERY 5 MINUTES UP TO 15 MIN FOR CHEST PAIN. IF NO RELIEF CALL 911.  . NP THYROID 90 MG tablet Take 1 tablet (90 mg total) by mouth daily.  Glory Rosebush ULTRA test strip USE AS DIRECTED UP TO 3 TIMES DAILY IF NEEDED.  Marland Kitchen pantoprazole (PROTONIX) 40 MG tablet Take 40 mg by mouth as needed.   . ranolazine (RANEXA) 500 MG 12 hr tablet Take 500 mg by mouth 2 (two) times daily.  . sucralfate (CARAFATE) 1  g tablet Take 1 tablet (1 g total) by mouth 4 (four) times daily as needed.     Ramirez-Perez Office Visit from 10/02/2020 in [facility]  PHQ-9 Total Score 0      Objective:   Today's Vitals: BP 128/66 (BP Location: Right Arm, Patient Position: Sitting, Cuff Size: Normal)   Pulse 62   Temp 97.7 F (36.5 C) (Temporal)   Resp 18   Ht 6' (1.829 m)   Wt 216 lb 9.6 oz (98.2 kg)   SpO2 98%   BMI 29.38 kg/m  Vitals with BMI 01/02/2021 10/26/2020 10/10/2020  Height 6\' 0"  - 6\' 0"   Weight 216 lbs 10 oz - 216 lbs 13 oz  BMI 82.95 - 62.1  Systolic 308 657 846  Diastolic 66 74 78  Pulse 62 93 66     Physical Exam  He looks systemically well his weight is stable.  Blood pressure is excellent for his age.     Assessment   1. Non-insulin treated type 2 diabetes mellitus ([place])   2. Essential hypertension   3. Dyslipidemia   4. CAD S/P percutaneous coronary angioplasty       Tests ordered Orders Placed This Encounter  Procedures  . COMPLETE METABOLIC PANEL WITH GFR  . Hemoglobin A1c     Plan: 1. Continue with glipizide/metformin for his diabetes.  Check an A1c. 2. Continue with ACE inhibitor for his hypertension which is well controlled.  Check renal function. 3. I will see him in about 6 months for an annual physical exam.   No orders of the defined types were placed in this encounter.   [name], MD

## 2021-01-03 LAB — COMPLETE METABOLIC PANEL WITH GFR
AG Ratio: 1.2 (calc) (ref 1.0–2.5)
ALT: 16 U/L (ref 9–46)
AST: 19 U/L (ref 10–35)
Albumin: 3.8 g/dL (ref 3.6–5.1)
Alkaline phosphatase (APISO): 51 U/L (ref 35–144)
BUN: 15 mg/dL (ref 7–25)
CO2: 28 mmol/L (ref 20–32)
Calcium: 9.6 mg/dL (ref 8.6–10.3)
Chloride: 109 mmol/L (ref 98–110)
Creat: 1.16 mg/dL (ref 0.70–1.18)
GFR, Est African American: 72 mL/min/{1.73_m2} (ref >=60)
GFR, Est Non African American: 62 mL/min/{1.73_m2} (ref >=60)
Globulin: 3.1 g/dL (calc) (ref 1.9–3.7)
Glucose, Bld: 86 mg/dL (ref 65–99)
Potassium: 4 mmol/L (ref 3.5–5.3)
Sodium: 142 mmol/L (ref 135–146)
Total Bilirubin: 0.4 mg/dL (ref 0.2–1.2)
Total Protein: 6.9 g/dL (ref 6.1–8.1)

## 2021-01-03 LAB — HEMOGLOBIN A1C
Hgb A1c MFr Bld: 6.3 % of total Hgb — ABNORMAL HIGH (ref <5.7)
Mean Plasma Glucose: 134 mg/dL
eAG (mmol/L): 7.4 mmol/L

## 2021-02-04 LAB — LIPID PANEL
Cholesterol: 135 (ref 0–200)
HDL: 48 (ref 35–70)
LDL Cholesterol: 61
Triglycerides: 128 (ref 40–160)

## 2021-02-04 LAB — CBC: RBC: 4.01 (ref 3.87–5.11)

## 2021-02-04 LAB — HEPATIC FUNCTION PANEL
ALT: 21 (ref 10–40)
AST: 20 (ref 14–40)
Alkaline Phosphatase: 50 (ref 25–125)
Bilirubin, Direct: 0.2
Bilirubin, Total: 0.8

## 2021-02-04 LAB — TSH: TSH: 0.21 — AB (ref <=5.90)

## 2021-02-04 LAB — HEMOGLOBIN A1C: Hemoglobin A1C: 6.4

## 2021-02-04 LAB — COMPREHENSIVE METABOLIC PANEL
Albumin: 3.7 (ref 3.5–5.0)
Calcium: 9.4 (ref 8.7–10.7)

## 2021-02-04 LAB — BASIC METABOLIC PANEL
CO2: 27 — AB (ref 13–22)
Chloride: 110 — AB (ref 99–108)
Glucose: 114
Potassium: 3.9 (ref 3.4–5.3)
Sodium: 140 (ref 137–147)

## 2021-02-04 LAB — CBC AND DIFFERENTIAL
HCT: 38 — AB (ref 41–53)
Hemoglobin: 13 — AB (ref 13.5–17.5)
Platelets: 146 — AB (ref 150–399)
WBC: 5.5

## 2021-02-20 ENCOUNTER — Other Ambulatory Visit (INDEPENDENT_AMBULATORY_CARE_PROVIDER_SITE_OTHER): Payer: Self-pay | Admitting: Internal Medicine

## 2021-02-25 ENCOUNTER — Encounter (INDEPENDENT_AMBULATORY_CARE_PROVIDER_SITE_OTHER): Payer: Self-pay

## 2021-02-25 ENCOUNTER — Telehealth (INDEPENDENT_AMBULATORY_CARE_PROVIDER_SITE_OTHER): Payer: Self-pay

## 2021-02-25 NOTE — Telephone Encounter (Signed)
Patient came into the office today and gave me updated labs from the [place] and wants to know if you can send in a prescription of his Thyroid medication and he does not know if he needs to stay on the NP Thyroid 90mg  or if you need to increase his dosage? His recent TSH is 0.21 and was drawn on 02/04/2021. Patient would like his medication sent to [facility] as he is out. Please advise. Updated labs are in folder.

## 2021-02-25 NOTE — Telephone Encounter (Signed)
I do not see any other thyroid tests on his paperwork.

## 2021-02-25 NOTE — Telephone Encounter (Signed)
In that case, continue with the same dose of NP thyroid for now.

## 2021-02-25 NOTE — Telephone Encounter (Signed)
Apart from the [facility], was there any other thyroid test done?  Free T3?

## 2021-02-26 ENCOUNTER — Telehealth (INDEPENDENT_AMBULATORY_CARE_PROVIDER_SITE_OTHER): Payer: Self-pay | Admitting: Internal Medicine

## 2021-02-26 ENCOUNTER — Other Ambulatory Visit (INDEPENDENT_AMBULATORY_CARE_PROVIDER_SITE_OTHER): Payer: Self-pay | Admitting: Internal Medicine

## 2021-02-26 MED ORDER — NP THYROID 90 MG PO TABS: 90.0000 mg | ORAL_TABLET | Freq: Every day | ORAL | 3 refills | Status: DC

## 2021-02-26 NOTE — Telephone Encounter (Signed)
Pt came in office today to find out about his refill NP THYROID 90 MG tablet  being sent to pharmacy. Please call  Cb#: [phone]

## 2021-02-26 NOTE — Telephone Encounter (Signed)
Please call the patient and let him know that I have sent this refill to the [place] on [address].

## 2021-02-27 NOTE — Telephone Encounter (Signed)
Called patient and gave him the information and let him know that he sent a refill to his pharmacy. Patient verbalized an understanding and thanked [place].

## 2021-02-27 NOTE — Telephone Encounter (Signed)
Called patient and left a detailed voice message to let patient know that his prescription has been sent to the pharmacy.

## 2021-03-04 ENCOUNTER — Telehealth (INDEPENDENT_AMBULATORY_CARE_PROVIDER_SITE_OTHER): Payer: Self-pay

## 2021-03-04 ENCOUNTER — Other Ambulatory Visit (INDEPENDENT_AMBULATORY_CARE_PROVIDER_SITE_OTHER): Payer: Self-pay | Admitting: Internal Medicine

## 2021-03-04 MED ORDER — NIRMATRELVIR/RITONAVIR (PAXLOVID)TABLET: 3.0000 | ORAL_TABLET | Freq: Two times a day (BID) | ORAL | 0 refills | Status: AC

## 2021-03-04 NOTE — Telephone Encounter (Signed)
Okay let the patient know that I have sent Paxlovid to [facility].

## 2021-03-04 NOTE — Telephone Encounter (Signed)
Called patient and gave him the message. Patient verbalized an understanding and thanked [place].

## 2021-03-04 NOTE — Telephone Encounter (Signed)
Patient called and left a detailed voice message that he tested Positive for Covid last Thursday 02/28/2021 and tested Positive again today for Covid and is asking if there is something you can send in for him? He has a cough and nasal congestion. Symptoms started late on Wednesday night into Thursday morning. Patient uses Personal assistant.

## 2021-04-17 ENCOUNTER — Ambulatory Visit: Payer: HMO | Admitting: Gastroenterology

## 2021-04-17 ENCOUNTER — Other Ambulatory Visit: Payer: Self-pay

## 2021-04-17 ENCOUNTER — Encounter: Payer: Self-pay | Admitting: Gastroenterology

## 2021-04-17 ENCOUNTER — Ambulatory Visit: Payer: HMO | Admitting: Nurse Practitioner

## 2021-04-17 VITALS — BP 140/77 | HR 61 | Temp 97.5°F | Ht 72.0 in | Wt 210.8 lb

## 2021-04-17 DIAGNOSIS — K219 Gastro-esophageal reflux disease without esophagitis: Secondary | ICD-10-CM | POA: Diagnosis not present

## 2021-04-17 DIAGNOSIS — K5900 Constipation, unspecified: Secondary | ICD-10-CM

## 2021-04-17 DIAGNOSIS — D509 Iron deficiency anemia, unspecified: Secondary | ICD-10-CM

## 2021-04-17 NOTE — Progress Notes (Signed)
Primary Care Physician: [name], MD (Inactive)  Primary Gastroenterologist:  Garfield Cornea, MD (Dr. Gala Romney has been primary GI, last few notes stated Dr. Abbey Chatters but that was incorrect).   Chief Complaint  Patient presents with   Gastroesophageal Reflux    ok   Constipation    ok     HPI: [name] is a 75 y.o. male here for follow-up.  Patient last seen in March 2022 for GERD, anemia, and constipation.  Colonoscopy in 2021: Diverticulosis in the sigmoid and distal ascending colon, single 5 mm polyp (tubular adenoma) removed, cecal AVM ablated.  EGD in 2020 with erythematous mucosa in the stomach consistent with PPI effect.  Today: Patient reports she is doing well.  Reflux is well controlled.  Takes Dexilant 60 mg daily, occasionally alternates with pantoprazole to prevent "resistance to Dexilant ".  No dysphagia, vomiting, abdominal pain.  Bowel movements currently regular.  Stools are soft.  No straining.  Taking Colace '200mg'$  every other day. No dulcolax.  No melena or rectal bleeding.  Last labs in June 2022.  Hemoglobin 13.  See labs below.  Current Outpatient Medications  Medication Sig Dispense Refill   acetaminophen (TYLENOL) 500 MG tablet Take 1,000 mg by mouth as needed for moderate pain.      Alirocumab (PRALUENT) 75 MG/ML SOAJ Inject 1 Dose into the skin every 14 (fourteen) days.     aspirin 81 MG chewable tablet Chew 1 tablet (81 mg total) by mouth daily.     bisacodyl (DULCOLAX) 5 MG EC tablet Take 5 mg by mouth every other day.     Cholecalciferol (VITAMIN D3) 50 MCG (2000 UT) TABS Take 3 tablets by mouth daily.     clopidogrel (PLAVIX) 75 MG tablet TAKE ONE TABLET BY MOUTH ONCE DAILY. 30 tablet 9   cyclobenzaprine (FLEXERIL) 10 MG tablet Take 1 tablet by mouth as needed.     dexlansoprazole (DEXILANT) 60 MG capsule Take 1 capsule (60 mg total) by mouth daily. 90 capsule 3   dextromethorphan-guaiFENesin (MUCINEX DM) 30-600 MG 12hr tablet Take 1  tablet by mouth as needed.      diclofenac Sodium (VOLTAREN) 1 % GEL Apply topically as needed.     docusate sodium (COLACE) 100 MG capsule Take 200 mg by mouth every other day.     fexofenadine (ALLEGRA) 180 MG tablet Take 180 mg by mouth as needed for allergies or rhinitis. Rotates with Claritin.     glipiZIDE-metformin (METAGLIP) 5-500 MG tablet Take 1 tablet by mouth daily at 12 noon. 90 tablet 1   isosorbide mononitrate (IMDUR) 60 MG 24 hr tablet Take 1 tablet (60 mg total) by mouth daily. 90 tablet 3   lisinopril (PRINIVIL,ZESTRIL) 5 MG tablet Take 5 mg by mouth daily.     metoprolol succinate (TOPROL-XL) 25 MG 24 hr tablet TAKE 2 TABLETS BY MOUTH ONCE DAILY. 180 tablet 3   nitroGLYCERIN (NITROSTAT) 0.4 MG SL tablet DISSOLVE 1 TABLET UNDER TONGUE EVERY 5 MINUTES UP TO 15 MIN FOR CHEST PAIN. IF NO RELIEF CALL 911. 25 tablet 0   NP THYROID 90 MG tablet Take 1 tablet (90 mg total) by mouth daily. 30 tablet 3   ONETOUCH ULTRA test strip USE AS DIRECTED UP TO 3 TIMES DAILY IF NEEDED. 100 strip 3   pantoprazole (PROTONIX) 40 MG tablet Take 40 mg by mouth as needed.      ranolazine (RANEXA) 500 MG 12 hr tablet Take 500 mg  by mouth 2 (two) times daily.     sucralfate (CARAFATE) 1 g tablet Take 1 tablet (1 g total) by mouth 4 (four) times daily as needed. 90 tablet 1   No current facility-administered medications for this visit.    Allergies as of 04/17/2021 - Review Complete 04/17/2021  Allergen Reaction Noted   Crestor [rosuvastatin]  04/26/2020   Propoxyphene n-acetaminophen Nausea Only    Statins Other (See Comments) 07/29/2011   Tape  07/29/2011   Zetia [ezetimibe]  08/30/2017   Zocor [simvastatin]  04/26/2020   Flomax [tamsulosin hcl] Rash 08/16/2014   Flomax [tamsulosin] Rash 11/09/2015   Levaquin [levofloxacin hemihydrate] Rash 07/29/2011   Levofloxacin Rash 11/09/2015   Penicillin g Diarrhea and Rash 11/09/2015   Penicillins Rash     ROS:  General: Negative for anorexia,  weight loss, fever, chills, fatigue, weakness. ENT: Negative for hoarseness, difficulty swallowing , nasal congestion. CV: Negative for chest pain, angina, palpitations, dyspnea on exertion, peripheral edema.  Respiratory: Negative for dyspnea at rest, dyspnea on exertion, cough, sputum, wheezing.  GI: See history of present illness. GU:  Negative for dysuria, hematuria, urinary incontinence, urinary frequency, nocturnal urination.  Endo: Negative for unusual weight change.    Physical Examination:   BP 140/77   Pulse 61   Temp (!) 97.5 F (36.4 C) (Temporal)   Ht 6' (1.829 m)   Wt 210 lb 12.8 oz (95.6 kg)   BMI 28.59 kg/m   General: Well-nourished, well-developed in no acute distress.  Eyes: No icterus. Mouth: masked Abdomen: Bowel sounds are normal, nontender, nondistended, no hepatosplenomegaly or masses, no abdominal bruits or hernia , no rebound or guarding.   Extremities: No lower extremity edema. No clubbing or deformities. Neuro: Alert and oriented x 4   Skin: Warm and dry, no jaundice.   Psych: Alert and cooperative, normal mood and affect.  Labs:  Lab Results  Component Value Date   CREATININE 1.16 01/02/2021   BUN 15 01/02/2021   NA 140 02/04/2021   K 3.9 02/04/2021   CL 110 (A) 02/04/2021   CO2 27 (A) 02/04/2021   Lab Results  Component Value Date   WBC 5.5 02/04/2021   HGB 13.0 (A) 02/04/2021   HCT 38 (A) 02/04/2021   MCV 97.8 06/20/2020   PLT 146 (A) 02/04/2021   Lab Results  Component Value Date   HGBA1C 6.4 02/04/2021   Lab Results  Component Value Date   ALT 21 02/04/2021   AST 20 02/04/2021   ALKPHOS 50 02/04/2021   BILITOT 0.4 01/02/2021   Lab Results  Component Value Date   VITAMINB12 303 05/05/2019   Lab Results  Component Value Date   FOLATE 12.5 05/05/2019   Lab Results  Component Value Date   IRON 41 (L) 05/05/2019   TIBC 306 05/05/2019   FERRITIN 28 01/12/2020     Imaging Studies: No results  found.   Assessment:  GERD: Doing well at this time.  Typically takes Dexilant 60 mg daily predominantly.  At times will switch to pantoprazole for several weeks.  Uses Tums and Carafate if she has flares.  Constipation: Doing well with Colace 200 mg every other day.  Anemia: Hemoglobin in June 2022 near normal.  We will recheck between now and the end of the year.   Plan: CBC, iron/tibc/ferritin with next labs. Continue Dexilant 60 mg daily for pantoprazole 40 mg daily as before.  Continue Colace 200 mg daily as needed. Return to the office in 1  year or call sooner if needed.

## 2021-04-17 NOTE — Patient Instructions (Signed)
Please take lab orders with you when you have your next labs with cardiology or your new PCP.  Continue current medications for reflux and constipation. Return to the office in one year or call sooner if needed.

## 2021-05-22 ENCOUNTER — Emergency Department (HOSPITAL_COMMUNITY): Payer: HMO

## 2021-05-22 ENCOUNTER — Emergency Department (HOSPITAL_COMMUNITY)
Admission: EM | Admit: 2021-05-22 | Discharge: 2021-05-22 | Disposition: A | Discharge status: Home / Self Care | Payer: HMO | Attending: Emergency Medicine | Admitting: Emergency Medicine

## 2021-05-22 ENCOUNTER — Telehealth: Payer: Self-pay | Admitting: Cardiovascular Disease

## 2021-05-22 ENCOUNTER — Encounter (HOSPITAL_COMMUNITY): Payer: Self-pay

## 2021-05-22 ENCOUNTER — Other Ambulatory Visit: Payer: Self-pay

## 2021-05-22 DIAGNOSIS — E1165 Type 2 diabetes mellitus with hyperglycemia: Secondary | ICD-10-CM | POA: Diagnosis not present

## 2021-05-22 DIAGNOSIS — I509 Heart failure, unspecified: Secondary | ICD-10-CM | POA: Insufficient documentation

## 2021-05-22 DIAGNOSIS — Z7982 Long term (current) use of aspirin: Secondary | ICD-10-CM | POA: Diagnosis not present

## 2021-05-22 DIAGNOSIS — I2510 Atherosclerotic heart disease of native coronary artery without angina pectoris: Secondary | ICD-10-CM | POA: Insufficient documentation

## 2021-05-22 DIAGNOSIS — R001 Bradycardia, unspecified: Secondary | ICD-10-CM | POA: Diagnosis not present

## 2021-05-22 DIAGNOSIS — I110 Hypertensive heart disease with heart failure: Secondary | ICD-10-CM | POA: Diagnosis not present

## 2021-05-22 DIAGNOSIS — R079 Chest pain, unspecified: Secondary | ICD-10-CM | POA: Diagnosis present

## 2021-05-22 DIAGNOSIS — Z79899 Other long term (current) drug therapy: Secondary | ICD-10-CM | POA: Insufficient documentation

## 2021-05-22 DIAGNOSIS — Z7984 Long term (current) use of oral hypoglycemic drugs: Secondary | ICD-10-CM | POA: Insufficient documentation

## 2021-05-22 DIAGNOSIS — R0789 Other chest pain: Secondary | ICD-10-CM | POA: Insufficient documentation

## 2021-05-22 DIAGNOSIS — Z87891 Personal history of nicotine dependence: Secondary | ICD-10-CM | POA: Insufficient documentation

## 2021-05-22 LAB — TROPONIN I (HIGH SENSITIVITY)
Troponin I (High Sensitivity): 2 ng/L (ref <18)
Troponin I (High Sensitivity): 2 ng/L (ref <18)

## 2021-05-22 LAB — CBC WITH DIFFERENTIAL/PLATELET
Abs Immature Granulocytes: 0 10*3/uL (ref 0.00–0.07)
Basophils Absolute: 0 10*3/uL (ref 0.0–0.1)
Basophils Relative: 0 %
Eosinophils Absolute: 0.1 10*3/uL (ref 0.0–0.5)
Eosinophils Relative: 2 %
HCT: 39.9 % (ref 39.0–52.0)
Hemoglobin: 13.5 g/dL (ref 13.0–17.0)
Immature Granulocytes: 0 %
Lymphocytes Relative: 43 %
Lymphs Abs: 2.1 10*3/uL (ref 0.7–4.0)
MCH: 34.1 pg — ABNORMAL HIGH (ref 26.0–34.0)
MCHC: 33.8 g/dL (ref 30.0–36.0)
MCV: 100.8 fL — ABNORMAL HIGH (ref 80.0–100.0)
Monocytes Absolute: 0.3 10*3/uL (ref 0.1–1.0)
Monocytes Relative: 7 %
Neutro Abs: 2.3 10*3/uL (ref 1.7–7.7)
Neutrophils Relative %: 48 %
Platelets: 190 10*3/uL (ref 150–400)
RBC: 3.96 MIL/uL — ABNORMAL LOW (ref 4.22–5.81)
RDW: 12.6 % (ref 11.5–15.5)
WBC: 4.8 10*3/uL (ref 4.0–10.5)
nRBC: 0 % (ref 0.0–0.2)

## 2021-05-22 LAB — COMPREHENSIVE METABOLIC PANEL
ALT: 15 U/L (ref 0–44)
AST: 16 U/L (ref 15–41)
Albumin: 3.6 g/dL (ref 3.5–5.0)
Alkaline Phosphatase: 43 U/L (ref 38–126)
Anion gap: 3 — ABNORMAL LOW (ref 5–15)
BUN: 14 mg/dL (ref 8–23)
CO2: 28 mmol/L (ref 22–32)
Calcium: 9.5 mg/dL (ref 8.9–10.3)
Chloride: 107 mmol/L (ref 98–111)
Creatinine, Ser: 1.09 mg/dL (ref 0.61–1.24)
GFR, Estimated: >60 mL/min (ref >60)
Glucose, Bld: 131 mg/dL — ABNORMAL HIGH (ref 70–99)
Potassium: 4.1 mmol/L (ref 3.5–5.1)
Sodium: 138 mmol/L (ref 135–145)
Total Bilirubin: 0.9 mg/dL (ref 0.3–1.2)
Total Protein: 6.9 g/dL (ref 6.5–8.1)

## 2021-05-22 MED ORDER — OMEPRAZOLE 20 MG PO CPDR: 20.0000 mg | DELAYED_RELEASE_CAPSULE | Freq: Every day | ORAL | 0 refills | Status: DC

## 2021-05-22 MED ORDER — LIDOCAINE VISCOUS HCL 2 % MT SOLN
15.0000 mL | Freq: Once | OROMUCOSAL | Status: AC
  Administered 2021-05-22: 15 mL via ORAL
  Filled 2021-05-22: qty 15

## 2021-05-22 MED ORDER — ALUM & MAG HYDROXIDE-SIMETH 200-200-20 MG/5ML PO SUSP
30.0000 mL | Freq: Once | ORAL | Status: AC
  Administered 2021-05-22: 30 mL via ORAL
  Filled 2021-05-22: qty 30

## 2021-05-22 NOTE — Telephone Encounter (Signed)
Pt c/o of Chest Pain: STAT if CP now or developed within 24 hours  1. Are you having CP right now? yes  2. Are you experiencing any other symptoms (ex. SOB, nausea, vomiting, sweating)? no  3. How long have you been experiencing CP? Two weeks  4. Is your CP continuous or coming and going? Comes and goes  5. Have you taken Nitroglycerin? yes ?

## 2021-05-22 NOTE — ED Provider Notes (Signed)
[facility] EMERGENCY DEPARTMENT Provider Note   CSN: [id number] Arrival date & time: 05/22/21  1044     History Chief Complaint  Patient presents with   Chest Pain    [name] is a 75 y.o. male.  HPI  Patient with significant medical history of coronary artery disease with catheterization in 2012 and a failed catheterization in 2019, diabetes, hypertension, PVD presents with chief complaint of left-sided chest pain.  Patient states chest pain is in the left of his chest does not radiate, not associate with nausea, vomiting, lightheaded, dizziness, paresthesias or shortness of breath.  He states pain is not exacerbated with movement, he has no orthopnea or pedal edema, denies p.o. intake making the pain  worse.  States that he is taking some nitro which seem to help with his pain, states that he has has a history of GERD and he states this feels slightly similar to that.  He has no other complaints at this time.  He states that the pain lasts about 5 minutes then resolve on its own.  He has no history of PEs or DVTs, currently not on hormone therapy.  He does not endorse fevers, chills, abdominal pain, pedal edema.  Past Medical History:  Diagnosis Date   CHF (congestive heart failure) ([place])    Coronary artery disease 08/28/2010   s/p multiple caths 2012, BMS PCI OM1 on August 28, 2010-during NSTEMI; January 2019 non-STEMI- occlusion of OM stent (very late stent thrombosis), initial wire crossed with PTCA, but unable to rewire, PCI aborted--> plan medical therapy, normal EF   Diabetes mellitus    GERD (gastroesophageal reflux disease)    Hyperlipidemia    Hypertension    Non-STEMI (non-ST elevated myocardial infarction) [facility]) January 2012 and 2019   a) Jan 2012: 99% OM1 - BMS PCI; b) Jan 2019: Very late stent thrombosis/100% OM1 -after initially causing him for repeat PTCA restoring flow, unable to recross to place stent. - >  Medical therapy.   PVD (peripheral vascular disease)  ([place])    left SFA PTA & stenting in 02/2005 (Dr. 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   Myositis of thigh 03/23/2020   Constipation 08/24/2019   Stable angina ([place]) 07/27/2019   IDA (iron deficiency anemia) 05/26/2019   Vitamin D deficiency disease 05/04/2019   NSTEMI (non-ST elevated myocardial infarction) ([place])    Heme positive stool 01/12/2017   GERD (gastroesophageal reflux disease) 01/09/2017   Hepatomegaly 11/08/2014   Chest pain 09/07/2013   Essential hypertension 09/07/2013   CAD S/P percutaneous coronary angioplasty 07/16/2011   Stented coronary artery 07/16/2011   Non-insulin treated type 2 diabetes mellitus ([place]) 07/16/2011   Hyperlipidemia with target LDL less than 70 07/16/2011   Peripheral vascular disease ([place]) 01/10/2010   GASTROESOPHAGEAL REFLUX DISEASE, SEVERE 01/10/2010    Past Surgical History:  Procedure Laterality Date   BIOPSY  08/02/2019   Procedure: BIOPSY;  Surgeon: Daneil Dolin, MD;  [facility];  Service: Endoscopy;;  gastric    CARDIAC CATHETERIZATION  12/23/2004   normal L main, normal LAD, normal L Cfx, RCA with 20% hypodense lesion in first end of vessel (Dr. Adora Fridge)   [place]  08/26/2007   no significant CAD by cath, EF 50% (Dr. Jackie Plum)   COLONOSCOPY  12/2009   Dr. Hampton Abbot   COLONOSCOPY N/A 01/30/2017  pancolonic diverticulosis, non-bleeding internal hemorrhoids.   COLONOSCOPY WITH PROPOFOL N/A 09/08/2019   Procedure: COLONOSCOPY WITH PROPOFOL;  Surgeon: Daneil Dolin, MD;  Location: [facility];  Service: Endoscopy;  Laterality: N/A;  11:15am   CORONARY BALLOON ANGIOPLASTY N/A 08/30/2017   Procedure: CORONARY BALLOON ANGIOPLASTY;  Surgeon: [name], MD;  Location: [place] CV LAB;  Service: Cardiovascular;  100% CTO very late stent thrombosis OM1 -> initially crossed with PTCA, but then  unable to recross after losing my positioning.  PTCA ABORTED.  UNSUCCESSFUL ATTEMPT   CORONARY BALLOON ANGIOPLASTY  02/10/2011   95% prox in-stent restenosis within OM stent - opened with cutting balloon (Dr. Corky Downs)   CORONARY STENT INTERVENTION  07/17/2011   in-stent restenosis - re-stented with Promus 2.25x88mm DES (Dr. Roni Bread)   [place]  08/28/2010   NSTEMI: OM1 99% BMS PCI 2.0x76mm MiniVision BMS (Dr. 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 Adora Fridge)   LEFT HEART CATH AND CORONARY ANGIOGRAPHY N/A 08/30/2017   Procedure: LEFT HEART CATH AND CORONARY ANGIOGRAPHY;  Surgeon: [name], MD;  Location: [place] CV LAB;  Service: Cardiovascular;  100% very late stent thrombosis of Overlapped BMS-DES OM1 -> attempted PTCA   LEFT HEART CATH AND CORONARY ANGIOGRAPHY  08/28/2010   NSTEMI: OM1 99% BMS PCI  (Dr. Adora Fridge)   LEFT HEART CATH AND CORONARY ANGIOGRAPHY  09/11/2010   patent stent (Dr. Roni Bread)   [place] CATH AND CORONARY ANGIOGRAPHY  02/10/2011   95% prox in-stent restenosis within OM stent  (Dr. Corky Downs)   LEFT HEART CATHETERIZATION WITH CORONARY ANGIOGRAM N/A 07/17/2011   Procedure: LEFT HEART CATHETERIZATION WITH CORONARY ANGIOGRAM;  Surgeon: Leonie Man, MD;  Location: [facility] CATH LAB;  Service: Cardiovascular;  Laterality: N/A;  Right radial approach;  90% ISR of BMS (5 months post PTCA for ISR) --> DES PCI   left knee arthroscopy  05/2016   NM  MYOCAR PERF WALL MOTION  09/08/2013   abnormal lexiscan - low to intermediate risk;    POLYPECTOMY  09/08/2019   Procedure: POLYPECTOMY;  Surgeon: Daneil Dolin, MD;  [facility];  Service: Endoscopy;;   TOOTH EXTRACTION Right 06/19/2020   TRANSTHORACIC ECHOCARDIOGRAM  09/01/2017   Normal LV size and function.  EF 66 5%.  Normal wall motion.  GR 1 DD.  Mild aortic sclerosis.  Aortic root mildly dilated at 40 mm.       Family History  Problem Relation Age of Onset   Arrhythmia Mother 35   Colon cancer Neg Hx    Liver disease Neg Hx    Inflammatory bowel disease Neg Hx    Colon polyps Neg Hx     Social History   Tobacco Use   Smoking status: Former    Years: 50.00    Types: Cigarettes    Quit date: 05/12/1999    Years since quitting: 22.0   Smokeless tobacco: Never  Vaping Use   Vaping Use: Never used  Substance Use Topics  Alcohol use: No   Drug use: No    Home Medications Prior to Admission medications   Medication Sig Start Date End Date Taking? Authorizing Provider  omeprazole (PRILOSEC) 20 MG capsule Take 1 capsule (20 mg total) by mouth daily. 05/22/21 06/21/21 Yes Marcello Fennel, PA-C  acetaminophen (TYLENOL) 500 MG tablet Take 1,000 mg by mouth as needed for moderate pain.     [provider]  Alirocumab (PRALUENT) 75 MG/ML SOAJ Inject 1 Dose into the skin every 14 (fourteen) days.    [provider]  aspirin 81 MG chewable tablet Chew 1 tablet (81 mg total) by mouth daily. 09/03/17   Cheryln Manly, NP  bisacodyl (DULCOLAX) 5 MG EC tablet Take 5 mg by mouth every other day.    [provider]  Cholecalciferol (VITAMIN D3) 50 MCG (2000 UT) TABS Take 3 tablets by mouth daily.    [provider]  clopidogrel (PLAVIX) 75 MG tablet TAKE ONE TABLET BY MOUTH ONCE DAILY. 12/07/15   [name], MD  cyclobenzaprine (FLEXERIL) 10 MG tablet Take 1 tablet by mouth as needed. 09/07/20   [provider]   dexlansoprazole (DEXILANT) 60 MG capsule Take 1 capsule (60 mg total) by mouth daily. 10/10/20   Carlis Stable, NP  dextromethorphan-guaiFENesin (MUCINEX DM) 30-600 MG 12hr tablet Take 1 tablet by mouth as needed.     [provider]  diclofenac Sodium (VOLTAREN) 1 % GEL Apply topically as needed. 09/07/20   [provider]  docusate sodium (COLACE) 100 MG capsule Take 200 mg by mouth every other day.    [provider]  fexofenadine (ALLEGRA) 180 MG tablet Take 180 mg by mouth as needed for allergies or rhinitis. Rotates with Claritin.    [provider]  glipiZIDE-metformin (METAGLIP) 5-500 MG tablet Take 1 tablet by mouth daily at 12 noon. 02/20/21   [name], MD  isosorbide mononitrate (IMDUR) 60 MG 24 hr tablet Take 1 tablet (60 mg total) by mouth daily. 06/21/19   [name], MD  lisinopril (PRINIVIL,ZESTRIL) 5 MG tablet Take 5 mg by mouth daily.    [provider]  metoprolol succinate (TOPROL-XL) 25 MG 24 hr tablet TAKE 2 TABLETS BY MOUTH ONCE DAILY. 07/25/20   [name], MD  nitroGLYCERIN (NITROSTAT) 0.4 MG SL tablet DISSOLVE 1 TABLET UNDER TONGUE EVERY 5 MINUTES UP TO 15 MIN FOR CHEST PAIN. IF NO RELIEF CALL 911. 06/19/20   [name], MD  NP THYROID 90 MG tablet Take 1 tablet (90 mg total) by mouth daily. 02/26/21   Gosrani, Doristine Johns, MD  ONETOUCH ULTRA test strip USE AS DIRECTED UP TO 3 TIMES DAILY IF NEEDED. 03/28/20   Hurshel Party C, MD  pantoprazole (PROTONIX) 40 MG tablet Take 40 mg by mouth as needed.     [provider]  ranolazine (RANEXA) 500 MG 12 hr tablet Take 500 mg by mouth 2 (two) times daily.    [provider]  sucralfate (CARAFATE) 1 g tablet Take 1 tablet (1 g total) by mouth 4 (four) times daily as needed. 07/12/20   Carlis Stable, NP    Allergies    Crestor [rosuvastatin], Propoxyphene n-acetaminophen, Statins, Tape, Zetia [ezetimibe], Zocor [simvastatin], Flomax [tamsulosin  hcl], Flomax [tamsulosin], Levaquin [levofloxacin hemihydrate], Levofloxacin, Penicillin g, and Penicillins  Review of Systems   Review of Systems  Constitutional:  Negative for chills and fever.  HENT:  Negative for congestion.   Respiratory:  Negative for shortness of breath.   Cardiovascular:  Positive for chest pain. Negative for palpitations and leg swelling.  Gastrointestinal:  Negative for abdominal pain, diarrhea, nausea and vomiting.  Genitourinary:  Negative for enuresis.  Musculoskeletal:  Negative for back pain.  Skin:  Negative for rash.  Neurological:  Negative for dizziness and headaches.  Hematological:  Does not bruise/bleed easily.   Physical Exam Updated Vital Signs BP 129/63   Pulse (!) 51   Temp 98 F (36.7 C) (Oral)   Resp 17   Ht 6' (1.829 m)   Wt 93 kg   SpO2 96%   BMI 27.80 kg/m   Physical Exam Vitals and nursing note reviewed.  Constitutional:      General: He is not in acute distress.    Appearance: He is not ill-appearing.  HENT:     Head: Normocephalic and atraumatic.     Nose: No congestion.     Mouth/Throat:     Mouth: Mucous membranes are moist.     Pharynx: Oropharynx is clear.  Eyes:     Conjunctiva/sclera: Conjunctivae normal.  Cardiovascular:     Rate and Rhythm: Regular rhythm. Bradycardia present.     Pulses: Normal pulses.     Heart sounds: No murmur heard.   No friction rub. No gallop.  Pulmonary:     Effort: No respiratory distress.     Breath sounds: No wheezing, rhonchi or rales.  Chest:     Chest wall: No tenderness.  Abdominal:     Palpations: Abdomen is soft.     Tenderness: There is no abdominal tenderness. There is no right CVA tenderness or left CVA tenderness.  Musculoskeletal:        General: Normal range of motion.     Right lower leg: No edema.     Left lower leg: No edema.  Skin:    General: Skin is warm and dry.  Neurological:     Mental Status: He is alert.  Psychiatric:        Mood and Affect: Mood  normal.    ED Results / Procedures / Treatments   Labs (all labs ordered are listed, but only abnormal results are displayed) Labs Reviewed  COMPREHENSIVE METABOLIC PANEL - Abnormal; Notable for the following components:      Result Value   Glucose, Bld 131 (*)    Anion gap 3 (*)    All other components within normal limits  CBC WITH DIFFERENTIAL/PLATELET - Abnormal; Notable for the following components:   RBC 3.96 (*)    MCV 100.8 (*)    MCH 34.1 (*)    All other components within normal limits  TROPONIN I (HIGH SENSITIVITY)  TROPONIN I (HIGH SENSITIVITY)    EKG EKG Interpretation  Date/Time:  Wednesday May 22 2021 13:04:46 EDT Ventricular Rate:  55 PR Interval:  217 QRS Duration: 86 QT Interval:  418 QTC Calculation: 400 R Axis:   47 Text Interpretation: Sinus rhythm Borderline prolonged PR interval No significant change since last tracing Confirmed by Aletta Edouard [phone]) on 05/22/2021 1:13:53 PM  Radiology DG Chest 2 View  Result Date: 05/22/2021 CLINICAL DATA:  Chest pain.  Left-sided chest tightness. EXAM: CHEST - 2 VIEW COMPARISON:  09/17/2020 FINDINGS: Cardiac and mediastinal contours normal. Pulmonary vascularity normal. Lungs clear without infiltrate or effusion. 3 mm metallic foreign body overlying the right chest is unchanged from the prior study. IMPRESSION: No active cardiopulmonary disease. Electronically Signed   By: Franchot Gallo M.D.  On: 05/22/2021 12:36    Procedures Procedures   Medications Ordered in ED Medications  alum & mag hydroxide-simeth (MAALOX/MYLANTA) 200-200-20 MG/5ML suspension 30 mL (30 mLs Oral Given 05/22/21 1221)    And  lidocaine (XYLOCAINE) 2 % viscous mouth solution 15 mL (15 mLs Oral Given 05/22/21 1221)    ED Course  I have reviewed the triage vital signs and the nursing notes.  Pertinent labs & imaging results that were available during my care of the patient were reviewed by me and considered in my medical  decision making (see chart for details).  Clinical Course as of 05/22/21 1512  Wed May 22, 5685  3264 75 year old male with intermittent chest pain.  Does have coronary disease although this sounds fairly atypical.  Labs and troponin fairly unremarkable.  Improvement with GI cocktail.  Recommended close follow-up with his treating providers. [MB]    Clinical Course User Index [MB] Hayden Rasmussen, MD   MDM Rules/Calculators/A&P                          Initial impression-patient presents with left-sided chest pain.  He is alert, does not appear in acute stress, vital signs are reassuring, unclear etiology.  Possibly could be acid reflux but due to his extensive cardiac history I am concerned for possible cardiac involvement.  We will try a GI cocktail obtain chest pain work-up and reassess.  Work-up-CBC unremarkable, CMP shows hyperglycemia 131, first troponin is 2, EKG sinus bradycardia without signs of ischemia, chest x-ray unremarkable.  Reassessment-patient reassessed after a GI cocktail he states his pain is completely resolved has no complaints.  We will continue to monitor.  Patient is reassessed he was sleeping did not appear to be in acute distress, he has no complaints, patient agreed for discharge at this time.  Rule out- I have low suspicion for ACS as history is atypical, patient has no cardiac history, EKG was sinus rhythm without signs of ischemia, patient had a delta troponin.  Low suspicion for PE as patient denies pleuritic chest pain, shortness of breath, patient denies leg pain, no pedal edema noted on exam, vital signs reassuring, nontachypneic, nonhypoxic, presentation atypical of etiology.  Low suspicion for AAA or aortic dissection as history is atypical, patient has low risk factors.  Low suspicion for systemic infection as patient is nontoxic-appearing, vital signs reassuring, no obvious source infection noted on exam.  I have low suspicion for arrhythmia and/or heart  block as there is no arrhythmias present EKG, patient states this is normal heart rates, this appears to be the case after reviewing patient's chart heart rates generally in the low 60s and 50s.   Plan-  Chest pain since resolved-likely this was secondary due to GERD we will start her on a PPI.  But differential diagnosis includes costochondritis, esophagitis, stable angina which I find least likely this presentation is atypical.  will have him follow-up with cardiologist as he has similar cardiac history.  Given strict return precautions.  Vital signs have remained stable, no indication for hospital admission.  Patient discussed with attending and they agreed with assessment and plan.  Patient given at home care as well strict return precautions.  Patient verbalized that they understood agreed to said plan.  Final Clinical Impression(s) / ED Diagnoses Final diagnoses:  Atypical chest pain    Rx / DC Orders ED Discharge Orders          Ordered    omeprazole (  PRILOSEC) 20 MG capsule  Daily        05/22/21 1511             Aron Baba 05/22/21 1512    Hayden Rasmussen, MD 05/22/21 1910

## 2021-05-22 NOTE — Discharge Instructions (Addendum)
Lab work and imaging are all reassuring.  Suspect you are setting from acid reflux, starting on an acid pill please take as prescribed, do not take this with Protonix as they are both the same medication.  Please follow-up with your cardiologist for further evaluation.  Come back to the emergency department if you develop chest pain, shortness of breath, severe abdominal pain, uncontrolled nausea, vomiting, diarrhea.

## 2021-05-22 NOTE — Telephone Encounter (Addendum)
Spoke with patient of Dr. Gwenlyn Found who reports chest pain off and on for 2 weeks. He reports the pain can come on at any time, can occur with rest and exertion. He reports episodes last 5-6 minutes. Patient has taken NTG for chest pain - taken max 1 dose at a time, w/relief. His last dose of NTG was 15 mins ago. He has h/o CAD with angioplasty, PAD. Advised with ongoing CP, current CP, he should go to ED for eval. He live 1 block from [facility] and will proceed here for evaluation.   Routed to MD as Juluis Rainier

## 2021-05-22 NOTE — ED Triage Notes (Signed)
Pt presents to ED with complaints of intermittent left sided chest tightness for a couple of weeks. Pt called his doctor this morning and instructed to come to ED. Pt states this episode started about 30 min ago.

## 2021-05-24 ENCOUNTER — Other Ambulatory Visit (HOSPITAL_BASED_OUTPATIENT_CLINIC_OR_DEPARTMENT_OTHER): Payer: Self-pay | Admitting: Cardiovascular Disease

## 2021-05-24 DIAGNOSIS — I739 Peripheral vascular disease, unspecified: Secondary | ICD-10-CM

## 2021-05-27 ENCOUNTER — Other Ambulatory Visit: Payer: Self-pay | Admitting: Cardiovascular Disease

## 2021-06-24 ENCOUNTER — Encounter (INDEPENDENT_AMBULATORY_CARE_PROVIDER_SITE_OTHER): Payer: HMO | Admitting: Internal Medicine

## 2021-08-01 ENCOUNTER — Other Ambulatory Visit: Payer: HMO

## 2021-08-01 ENCOUNTER — Other Ambulatory Visit: Payer: Self-pay

## 2021-08-01 DIAGNOSIS — N401 Enlarged prostate with lower urinary tract symptoms: Secondary | ICD-10-CM

## 2021-08-02 LAB — PSA: Prostate Specific Ag, Serum: 1.2 ng/mL (ref 0.0–4.0)

## 2021-08-05 NOTE — Progress Notes (Signed)
History of Present Illness: [name] returns for followup of BPH.  1st visit w/ [place] in 2016--previous evaluation by Dr Michela Pitcher in 2015 included a cystoscopy as well as renal U/S by Dr Michela Pitcher in 2015. At that time kidneys were normal, prostate volume was 62 mL, pvr volume was 75 mL. He has been tried on Flomax as well as Rapaflo, both of which caused rashes.  12.27.2022: His last visit was over 3 years ago.  He has not been on any medical therapy for BPH.  He does have moderate lower urinary tract symptoms.  IPSS 13, quality-of-life score 3.  No gross hematuria, no dysuria.  Recent PSA 1.2.  Past Medical History:  Diagnosis Date   CHF (congestive heart failure) ([place])    Coronary artery disease 08/28/2010   s/p multiple caths 2012, BMS PCI OM1 on August 28, 2010-during NSTEMI; January 2019 non-STEMI- occlusion of OM stent (very late stent thrombosis), initial wire crossed with PTCA, but unable to rewire, PCI aborted--> plan medical therapy, normal EF   Diabetes mellitus    GERD (gastroesophageal reflux disease)    Hyperlipidemia    Hypertension    Non-STEMI (non-ST elevated myocardial infarction) [facility]) January 2012 and 2019   a) Jan 2012: 99% OM1 - BMS PCI; b) Jan 2019: Very late stent thrombosis/100% OM1 -after initially causing him for repeat PTCA restoring flow, unable to recross to place stent. - >  Medical therapy.   PVD (peripheral vascular disease) ([place])    left SFA PTA & stenting in 02/2005 (Dr. Adora Fridge)   Vitamin D deficiency disease 05/04/2019    Past Surgical History:  Procedure Laterality Date   BIOPSY  08/02/2019   Procedure: BIOPSY;  Surgeon: Daneil Dolin, MD;  [facility];  Service: Endoscopy;;  gastric    CARDIAC CATHETERIZATION  12/23/2004   normal L main, normal LAD, normal L Cfx, RCA with 20% hypodense lesion in first end of vessel (Dr. Adora Fridge)   [place]  08/26/2007   no significant CAD by cath, EF 50% (Dr. Jackie Plum)   COLONOSCOPY  12/2009    Dr. Hampton Abbot   COLONOSCOPY N/A 01/30/2017   pancolonic diverticulosis, non-bleeding internal hemorrhoids.   COLONOSCOPY WITH PROPOFOL N/A 09/08/2019   Procedure: COLONOSCOPY WITH PROPOFOL;  Surgeon: Daneil Dolin, MD;  [facility];  Service: Endoscopy;  Laterality: N/A;  11:15am   CORONARY BALLOON ANGIOPLASTY N/A 08/30/2017   Procedure: CORONARY BALLOON ANGIOPLASTY;  Surgeon: [name], MD;  Location: [place] CV LAB;  Service: Cardiovascular;  100% CTO very late stent thrombosis OM1 -> initially crossed with PTCA, but then unable to recross after losing my positioning.  PTCA ABORTED.  UNSUCCESSFUL ATTEMPT   CORONARY BALLOON ANGIOPLASTY  02/10/2011   95% prox in-stent restenosis within OM stent - opened with cutting balloon (Dr. Corky Downs)   CORONARY STENT INTERVENTION  07/17/2011   in-stent restenosis - re-stented with Promus 2.25x58mm DES (Dr. Roni Bread)   [place]  08/28/2010   NSTEMI: OM1 99% BMS PCI 2.0x6mm MiniVision BMS (Dr. 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 Adora Fridge)   LEFT HEART CATH AND CORONARY ANGIOGRAPHY N/A 08/30/2017   Procedure: LEFT HEART CATH AND CORONARY ANGIOGRAPHY;  Surgeon: [name], MD;  Location: [place] CV LAB;  Service: Cardiovascular;  100% very late stent thrombosis of Overlapped BMS-DES OM1 -> attempted PTCA   LEFT HEART CATH AND CORONARY ANGIOGRAPHY  08/28/2010   NSTEMI: OM1 99% BMS PCI  (Dr. Adora Fridge)    LEFT HEART CATH AND CORONARY ANGIOGRAPHY  09/11/2010   patent stent (Dr. Roni Bread)   [place] CATH AND CORONARY ANGIOGRAPHY  02/10/2011   95% prox in-stent restenosis within OM stent  (Dr. Corky Downs)   LEFT HEART CATHETERIZATION WITH CORONARY ANGIOGRAM N/A 07/17/2011   Procedure: LEFT HEART CATHETERIZATION WITH CORONARY ANGIOGRAM;  Surgeon: Leonie Man, MD;  Location: [facility] CATH LAB;  Service: Cardiovascular;  Laterality: N/A;  Right radial approach;  90% ISR of BMS (5 months post PTCA for ISR) --> DES PCI   left knee arthroscopy  05/2016   NM MYOCAR PERF WALL MOTION  09/08/2013   abnormal lexiscan - low to intermediate risk;    POLYPECTOMY  09/08/2019   Procedure: POLYPECTOMY;  Surgeon: Daneil Dolin, MD;  [facility];  Service: Endoscopy;;   TOOTH EXTRACTION Right 06/19/2020   TRANSTHORACIC ECHOCARDIOGRAM  09/01/2017   Normal LV size and function.  EF 66 5%.  Normal wall motion.  GR 1 DD.  Mild aortic sclerosis.  Aortic root mildly dilated at 40 mm.    Home Medications:  Allergies as of 08/06/2021       Reactions   Crestor [rosuvastatin]    myalgia   Propoxyphene N-acetaminophen Nausea Only   Statins Other (See Comments)   Severe muscle cramping/aching/pain/ elevated CK   Tape    Blisters   Zetia [ezetimibe]    Muscle cramping   Zocor [simvastatin]    myalgia   Flomax [tamsulosin Hcl] Rash   Flomax [tamsulosin] Rash   Levaquin [levofloxacin Hemihydrate] Rash   Levofloxacin Rash   Penicillin G Diarrhea, Rash   Penicillins Rash   Broke out in rash 6 years ago, pt recently took penicillin (09/2016) and had no reaction Has patient had a PCN reaction causing immediate rash, facial/tongue/throat swelling, SOB or lightheadedness with hypotension: Yes Has patient had a PCN reaction causing severe rash involving mucus membranes or skin necrosis: Unknown Has patient had a PCN reaction that required hospitalization: No Has patient had a PCN reaction occurring within  the last 10 years: Yes If all of the above answers are "NO", then m        Medication List        Accurate as of August 05, 2021  7:05 PM. If you have any questions, ask your nurse or doctor.          acetaminophen 500 MG tablet Commonly known as: TYLENOL Take 1,000 mg by mouth as needed for moderate pain.   aspirin 81 MG chewable tablet Chew 1 tablet (81 mg total) by mouth daily.   bisacodyl 5 MG EC tablet Commonly known as: DULCOLAX Take 5 mg by mouth every other day.   clopidogrel 75 MG tablet Commonly known as: PLAVIX TAKE ONE TABLET BY MOUTH ONCE DAILY.   cyclobenzaprine 10 MG tablet Commonly known as: FLEXERIL Take 1 tablet by mouth as needed.   dexlansoprazole 60 MG capsule Commonly known as: DEXILANT Take 1 capsule (60 mg total) by  mouth daily.   dextromethorphan-guaiFENesin 30-600 MG 12hr tablet Commonly known as: MUCINEX DM Take 1 tablet by mouth as needed.   diclofenac Sodium 1 % Gel Commonly known as: VOLTAREN Apply topically as needed.   docusate sodium 100 MG capsule Commonly known as: COLACE Take 200 mg by mouth every other day.   fexofenadine 180 MG tablet Commonly known as: ALLEGRA Take 180 mg by mouth as needed for allergies or rhinitis. Rotates with Claritin.   glipiZIDE-metformin 5-500 MG tablet Commonly known as: METAGLIP Take 1 tablet by mouth daily at 12 noon.   isosorbide mononitrate 60 MG 24 hr tablet Commonly known as: IMDUR Take 1 tablet (60 mg total) by mouth daily.   lisinopril 5 MG tablet Commonly known as: ZESTRIL Take 5 mg by mouth daily.   metoprolol succinate 25 MG 24 hr tablet Commonly known as: TOPROL-XL TAKE 2 TABLETS BY MOUTH ONCE DAILY.   nitroGLYCERIN 0.4 MG SL tablet Commonly known as: NITROSTAT DISSOLVE 1 TABLET UNDER TONGUE EVERY 5 MINUTES UP TO 15 MIN FOR CHEST PAIN. IF NO RELIEF CALL 911.   NP Thyroid 90 MG tablet Generic drug: thyroid Take 1 tablet (90 mg total) by mouth daily.   omeprazole  20 MG capsule Commonly known as: PRILOSEC Take 1 capsule (20 mg total) by mouth daily.   OneTouch Ultra test strip Generic drug: glucose blood USE AS DIRECTED UP TO 3 TIMES DAILY IF NEEDED.   pantoprazole 40 MG tablet Commonly known as: PROTONIX Take 40 mg by mouth as needed.   Praluent 75 MG/ML Soaj Generic drug: Alirocumab Inject 1 Dose into the skin every 14 (fourteen) days.   ranolazine 500 MG 12 hr tablet Commonly known as: RANEXA Take 500 mg by mouth 2 (two) times daily.   sucralfate 1 g tablet Commonly known as: Carafate Take 1 tablet (1 g total) by mouth 4 (four) times daily as needed.   Vitamin D3 50 MCG (2000 UT) Tabs Take 3 tablets by mouth daily.        Allergies:  Allergies  Allergen Reactions   Crestor [Rosuvastatin]     myalgia   Propoxyphene N-Acetaminophen Nausea Only   Statins Other (See Comments)    Severe muscle cramping/aching/pain/ elevated CK   Tape     Blisters   Zetia [Ezetimibe]     Muscle cramping   Zocor [Simvastatin]     myalgia   Flomax [Tamsulosin Hcl] Rash   Flomax [Tamsulosin] Rash   Levaquin [Levofloxacin Hemihydrate] Rash   Levofloxacin Rash   Penicillin G Diarrhea and Rash   Penicillins Rash    Broke out in rash 6 years ago, pt recently took penicillin (09/2016) and had no reaction Has patient had a PCN reaction causing immediate rash, facial/tongue/throat swelling, SOB or lightheadedness with hypotension: Yes Has patient had a PCN reaction causing severe rash involving mucus membranes or skin necrosis: Unknown Has patient had a PCN reaction that required hospitalization: No Has patient had a PCN reaction occurring within the last 10 years: Yes If all of the above answers are "NO", then m    Family History  Problem Relation Age of Onset   Arrhythmia Mother 34   Colon cancer Neg Hx    Liver disease Neg Hx    Inflammatory bowel disease Neg Hx    Colon polyps Neg Hx     Social History:  reports that he quit smoking  about 22 years ago. He has never used smokeless tobacco. He reports that he does  not drink alcohol and does not use drugs.  ROS: A complete review of systems was performed.  All systems are negative except for pertinent findings as noted.  Physical Exam:  Vital signs in last 24 hours: There were no vitals taken for this visit. Constitutional:  Alert and oriented, No acute distress Cardiovascular: Regular rate  Respiratory: Normal respiratory effort GI: Abdomen is soft, nontender, nondistended, no abdominal masses. No CVAT.  Genitourinary: Normal male phallus, testes are descended bilaterally and non-tender and without masses, scrotum is normal in appearance without lesions or masses, perineum is normal on inspection.  Normal anal sphincter tone.  Glandular size 30 g, symmetric, nonnodular, nontender. Lymphatic: No lymphadenopathy Neurologic: Grossly intact, no focal deficits Psychiatric: Normal mood and affect  I have reviewed prior pt notes  I have reviewed notes from referring/previous physicians  I have reviewed urinalysis results  I have independently reviewed bladder scan-residual volume 13 mL  I have reviewed prior PSA results--recently 1.2    Impression/Assessment:  1.  BPH with moderate symptomatology.  He is emptying pretty well so I think he probably has an element of OAB.  2.  Screening for prostate cancer with recent PSA 1.2, benign exam today  Plan:  1.  Patient reassured about exam.  He would rather not be on any medications at the present time  2.  Overactive bladder guide sheet given  3.  I will see back in a year for recheck

## 2021-08-05 NOTE — Progress Notes (Incomplete)
History of Present Illness: ***  Past Medical History:  Diagnosis Date   CHF (congestive heart failure) ([place])    Coronary artery disease 08/28/2010   s/p multiple caths 2012, BMS PCI OM1 on August 28, 2010-during NSTEMI; January 2019 non-STEMI- occlusion of OM stent (very late stent thrombosis), initial wire crossed with PTCA, but unable to rewire, PCI aborted--> plan medical therapy, normal EF   Diabetes mellitus    GERD (gastroesophageal reflux disease)    Hyperlipidemia    Hypertension    Non-STEMI (non-ST elevated myocardial infarction) [facility]) January 2012 and 2019   a) Jan 2012: 99% OM1 - BMS PCI; b) Jan 2019: Very late stent thrombosis/100% OM1 -after initially causing him for repeat PTCA restoring flow, unable to recross to place stent. - >  Medical therapy.   PVD (peripheral vascular disease) ([place])    left SFA PTA & stenting in 02/2005 (Dr. Adora Fridge)   Vitamin D deficiency disease 05/04/2019    Past Surgical History:  Procedure Laterality Date   BIOPSY  08/02/2019   Procedure: BIOPSY;  Surgeon: Daneil Dolin, MD;  [facility];  Service: Endoscopy;;  gastric    CARDIAC CATHETERIZATION  12/23/2004   normal L main, normal LAD, normal L Cfx, RCA with 20% hypodense lesion in first end of vessel (Dr. Adora Fridge)   [place]  08/26/2007   no significant CAD by cath, EF 50% (Dr. Jackie Plum)   COLONOSCOPY  12/2009   Dr. Hampton Abbot   COLONOSCOPY N/A 01/30/2017   pancolonic diverticulosis, non-bleeding internal hemorrhoids.   COLONOSCOPY WITH PROPOFOL N/A 09/08/2019   Procedure: COLONOSCOPY WITH PROPOFOL;  Surgeon: Daneil Dolin, MD;  [facility];  Service: Endoscopy;  Laterality: N/A;  11:15am   CORONARY BALLOON ANGIOPLASTY N/A 08/30/2017   Procedure: CORONARY BALLOON ANGIOPLASTY;  Surgeon: [name], MD;  Location: [place] CV LAB;  Service: Cardiovascular;  100% CTO very late stent thrombosis OM1 -> initially crossed with PTCA, but then  unable to recross after losing my positioning.  PTCA ABORTED.  UNSUCCESSFUL ATTEMPT   CORONARY BALLOON ANGIOPLASTY  02/10/2011   95% prox in-stent restenosis within OM stent - opened with cutting balloon (Dr. Corky Downs)   CORONARY STENT INTERVENTION  07/17/2011   in-stent restenosis - re-stented with Promus 2.25x58mm DES (Dr. Roni Bread)   [place]  08/28/2010   NSTEMI: OM1 99% BMS PCI 2.0x45mm MiniVision BMS (Dr. 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 Adora Fridge)   LEFT HEART CATH AND CORONARY ANGIOGRAPHY N/A 08/30/2017   Procedure: LEFT HEART CATH AND CORONARY ANGIOGRAPHY;  Surgeon: [name], MD;  Location: [place] CV LAB;  Service: Cardiovascular;  100% very late stent thrombosis of Overlapped BMS-DES OM1 -> attempted PTCA   LEFT HEART CATH AND CORONARY ANGIOGRAPHY  08/28/2010   NSTEMI: OM1 99% BMS PCI  (Dr. Adora Fridge)   LEFT HEART CATH AND CORONARY ANGIOGRAPHY  09/11/2010   patent stent (Dr. Roni Bread)   [place] CATH AND CORONARY ANGIOGRAPHY  02/10/2011  95% prox in-stent restenosis within OM stent  (Dr. Corky Downs)   LEFT HEART CATHETERIZATION WITH CORONARY ANGIOGRAM N/A 07/17/2011   Procedure: LEFT HEART CATHETERIZATION WITH CORONARY ANGIOGRAM;  Surgeon: Leonie Man, MD;  Location: [facility] CATH LAB;  Service: Cardiovascular;  Laterality: N/A;  Right radial approach;  90% ISR of BMS (5 months post PTCA for ISR) --> DES PCI   left knee arthroscopy  05/2016   NM  MYOCAR PERF WALL MOTION  09/08/2013   abnormal lexiscan - low to intermediate risk;    POLYPECTOMY  09/08/2019   Procedure: POLYPECTOMY;  Surgeon: Daneil Dolin, MD;  [facility];  Service: Endoscopy;;   TOOTH EXTRACTION Right 06/19/2020   TRANSTHORACIC ECHOCARDIOGRAM  09/01/2017   Normal LV size and function.  EF 66 5%.  Normal wall motion.  GR 1 DD.  Mild aortic sclerosis.  Aortic root mildly dilated at 40 mm.    Home Medications:  Allergies as of 08/06/2021       Reactions   Crestor [rosuvastatin]    myalgia   Propoxyphene N-acetaminophen Nausea Only   Statins Other (See Comments)   Severe muscle cramping/aching/pain/ elevated CK   Tape    Blisters   Zetia [ezetimibe]    Muscle cramping   Zocor [simvastatin]    myalgia   Flomax [tamsulosin Hcl] Rash   Flomax [tamsulosin] Rash   Levaquin [levofloxacin Hemihydrate] Rash   Levofloxacin Rash   Penicillin G Diarrhea, Rash   Penicillins Rash   Broke out in rash 6 years ago, pt recently took penicillin (09/2016) and had no reaction Has patient had a PCN reaction causing immediate rash, facial/tongue/throat swelling, SOB or lightheadedness with hypotension: Yes Has patient had a PCN reaction causing severe rash involving mucus membranes or skin necrosis: Unknown Has patient had a PCN reaction that required hospitalization: No Has patient had a PCN reaction occurring within the last 10 years: Yes If all of the above answers are "NO", then m        Medication List        Accurate as of August 05, 2021  6:58 PM. If you have any questions, ask your nurse or doctor.          acetaminophen 500 MG tablet Commonly known as: TYLENOL Take 1,000 mg by mouth as needed for moderate pain.   aspirin 81 MG chewable tablet Chew 1 tablet (81 mg total) by mouth daily.   bisacodyl 5 MG EC tablet Commonly known as: DULCOLAX Take 5 mg by mouth every other day.   clopidogrel 75 MG tablet Commonly known as: PLAVIX TAKE  ONE TABLET BY MOUTH ONCE DAILY.   cyclobenzaprine 10 MG tablet Commonly known as: FLEXERIL Take 1 tablet by mouth as needed.   dexlansoprazole 60 MG capsule Commonly known as: DEXILANT Take 1 capsule (60 mg total) by mouth daily.   dextromethorphan-guaiFENesin 30-600 MG 12hr tablet Commonly known as: MUCINEX DM Take 1 tablet by mouth as needed.   diclofenac Sodium 1 % Gel Commonly known as: VOLTAREN Apply topically as needed.   docusate sodium 100 MG capsule Commonly known as: COLACE Take 200 mg by mouth every other day.   fexofenadine 180 MG tablet Commonly known as: ALLEGRA Take 180 mg by mouth as needed for allergies or rhinitis. Rotates with Claritin.   glipiZIDE-metformin 5-500 MG tablet Commonly known as: METAGLIP Take 1 tablet by mouth daily at 12 noon.   isosorbide mononitrate 60 MG 24 hr tablet Commonly known as:  IMDUR Take 1 tablet (60 mg total) by mouth daily.   lisinopril 5 MG tablet Commonly known as: ZESTRIL Take 5 mg by mouth daily.   metoprolol succinate 25 MG 24 hr tablet Commonly known as: TOPROL-XL TAKE 2 TABLETS BY MOUTH ONCE DAILY.   nitroGLYCERIN 0.4 MG SL tablet Commonly known as: NITROSTAT DISSOLVE 1 TABLET UNDER TONGUE EVERY 5 MINUTES UP TO 15 MIN FOR CHEST PAIN. IF NO RELIEF CALL 911.   NP Thyroid 90 MG tablet Generic drug: thyroid Take 1 tablet (90 mg total) by mouth daily.   omeprazole 20 MG capsule Commonly known as: PRILOSEC Take 1 capsule (20 mg total) by mouth daily.   OneTouch Ultra test strip Generic drug: glucose blood USE AS DIRECTED UP TO 3 TIMES DAILY IF NEEDED.   pantoprazole 40 MG tablet Commonly known as: PROTONIX Take 40 mg by mouth as needed.   Praluent 75 MG/ML Soaj Generic drug: Alirocumab Inject 1 Dose into the skin every 14 (fourteen) days.   ranolazine 500 MG 12 hr tablet Commonly known as: RANEXA Take 500 mg by mouth 2 (two) times daily.   sucralfate 1 g tablet Commonly known as: Carafate Take 1  tablet (1 g total) by mouth 4 (four) times daily as needed.   Vitamin D3 50 MCG (2000 UT) Tabs Take 3 tablets by mouth daily.        Allergies:  Allergies  Allergen Reactions   Crestor [Rosuvastatin]     myalgia   Propoxyphene N-Acetaminophen Nausea Only   Statins Other (See Comments)    Severe muscle cramping/aching/pain/ elevated CK   Tape     Blisters   Zetia [Ezetimibe]     Muscle cramping   Zocor [Simvastatin]     myalgia   Flomax [Tamsulosin Hcl] Rash   Flomax [Tamsulosin] Rash   Levaquin [Levofloxacin Hemihydrate] Rash   Levofloxacin Rash   Penicillin G Diarrhea and Rash   Penicillins Rash    Broke out in rash 6 years ago, pt recently took penicillin (09/2016) and had no reaction Has patient had a PCN reaction causing immediate rash, facial/tongue/throat swelling, SOB or lightheadedness with hypotension: Yes Has patient had a PCN reaction causing severe rash involving mucus membranes or skin necrosis: Unknown Has patient had a PCN reaction that required hospitalization: No Has patient had a PCN reaction occurring within the last 10 years: Yes If all of the above answers are "NO", then m    Family History  Problem Relation Age of Onset   Arrhythmia Mother 42   Colon cancer Neg Hx    Liver disease Neg Hx    Inflammatory bowel disease Neg Hx    Colon polyps Neg Hx     Social History:  reports that he quit smoking about 22 years ago. He has never used smokeless tobacco. He reports that he does not drink alcohol and does not use drugs.  ROS: A complete review of systems was performed.  All systems are negative except for pertinent findings as noted.  Physical Exam:  Vital signs in last 24 hours: There were no vitals taken for this visit. Constitutional:  Alert and oriented, No acute distress Cardiovascular: Regular rate  Respiratory: Normal respiratory effort GI: Abdomen is soft, nontender, nondistended, no abdominal masses. No CVAT.  Genitourinary: Normal  male phallus, testes are descended bilaterally and non-tender and without masses, scrotum is normal in appearance without lesions or masses, perineum is normal on inspection. Lymphatic: No lymphadenopathy Neurologic: Grossly intact, no focal deficits  Psychiatric: Normal mood and affect  I have reviewed prior pt notes  I have reviewed notes from referring/previous physicians  I have reviewed urinalysis results  I have independently reviewed prior imaging  I have reviewed prior PSA results  I have reviewed prior urine culture   Impression/Assessment:  ***  Plan:  ***

## 2021-08-06 ENCOUNTER — Other Ambulatory Visit: Payer: Self-pay

## 2021-08-06 ENCOUNTER — Ambulatory Visit: Payer: HMO | Admitting: Urology

## 2021-08-06 ENCOUNTER — Encounter: Payer: Self-pay | Admitting: Urology

## 2021-08-06 VITALS — BP 148/75 | HR 58

## 2021-08-06 DIAGNOSIS — R3915 Urgency of urination: Secondary | ICD-10-CM | POA: Diagnosis not present

## 2021-08-06 DIAGNOSIS — N401 Enlarged prostate with lower urinary tract symptoms: Secondary | ICD-10-CM

## 2021-08-06 DIAGNOSIS — N138 Other obstructive and reflux uropathy: Secondary | ICD-10-CM

## 2021-08-06 DIAGNOSIS — R351 Nocturia: Secondary | ICD-10-CM

## 2021-08-06 LAB — URINALYSIS, ROUTINE W REFLEX MICROSCOPIC
Bilirubin, UA: NEGATIVE
Glucose, UA: NEGATIVE
Ketones, UA: NEGATIVE
Leukocytes,UA: NEGATIVE
Nitrite, UA: NEGATIVE
Protein,UA: NEGATIVE
RBC, UA: NEGATIVE
Specific Gravity, UA: >1.03 — ABNORMAL HIGH (ref 1.005–1.030)
Urobilinogen, Ur: 0.2 mg/dL (ref 0.2–1.0)
pH, UA: 5.5 (ref 5.0–7.5)

## 2021-08-06 LAB — BLADDER SCAN AMB NON-IMAGING

## 2021-08-06 NOTE — Progress Notes (Signed)
Urological Symptom Review  Patient is experiencing the following symptoms: Frequent urination Get up at night to urinate Stream starts and stops Trouble starting stream   Review of Systems  Gastrointestinal (upper)  : Indigestion/heartburn  Gastrointestinal (lower) : Constipation  Constitutional : Negative for symptoms  Skin: Negative for skin symptoms  Eyes: Negative for eye symptoms  Ear/Nose/Throat : Sinus problems  Hematologic/Lymphatic: Negative for Hematologic/Lymphatic symptoms  Cardiovascular : Negative for cardiovascular symptoms  Respiratory : Negative for respiratory symptoms  Endocrine: Negative for endocrine symptoms  Musculoskeletal: Back pain  Neurological: Negative for neurological symptoms  Psychologic: Negative for psychiatric symptoms

## 2021-08-27 ENCOUNTER — Other Ambulatory Visit: Payer: Self-pay

## 2021-08-27 ENCOUNTER — Ambulatory Visit (HOSPITAL_COMMUNITY)
Admission: RE | Admit: 2021-08-27 | Discharge: 2021-08-27 | Disposition: A | Discharge status: Home / Self Care | Payer: HMO | Source: Ambulatory Visit | Attending: Internal Medicine | Admitting: Internal Medicine

## 2021-08-27 DIAGNOSIS — I70209 Unspecified atherosclerosis of native arteries of extremities, unspecified extremity: Secondary | ICD-10-CM | POA: Insufficient documentation

## 2021-08-27 DIAGNOSIS — I739 Peripheral vascular disease, unspecified: Secondary | ICD-10-CM | POA: Diagnosis present

## 2021-09-02 LAB — HEMOGLOBIN A1C: Hemoglobin A1C: 7.1

## 2021-09-11 ENCOUNTER — Ambulatory Visit (INDEPENDENT_AMBULATORY_CARE_PROVIDER_SITE_OTHER): Payer: HMO | Admitting: Internal Medicine

## 2021-09-11 ENCOUNTER — Encounter: Payer: Self-pay | Admitting: Internal Medicine

## 2021-09-11 ENCOUNTER — Other Ambulatory Visit: Payer: Self-pay

## 2021-09-11 ENCOUNTER — Encounter: Payer: Self-pay | Admitting: *Deleted

## 2021-09-11 VITALS — BP 128/72 | HR 59 | Resp 16 | Ht 72.0 in | Wt 216.0 lb

## 2021-09-11 DIAGNOSIS — K0262 Dental caries on smooth surface penetrating into dentin: Secondary | ICD-10-CM | POA: Insufficient documentation

## 2021-09-11 DIAGNOSIS — J329 Chronic sinusitis, unspecified: Secondary | ICD-10-CM | POA: Insufficient documentation

## 2021-09-11 DIAGNOSIS — I10 Essential (primary) hypertension: Secondary | ICD-10-CM

## 2021-09-11 DIAGNOSIS — K219 Gastro-esophageal reflux disease without esophagitis: Secondary | ICD-10-CM

## 2021-09-11 DIAGNOSIS — Z23 Encounter for immunization: Secondary | ICD-10-CM | POA: Diagnosis not present

## 2021-09-11 DIAGNOSIS — E1151 Type 2 diabetes mellitus with diabetic peripheral angiopathy without gangrene: Secondary | ICD-10-CM

## 2021-09-11 DIAGNOSIS — I739 Peripheral vascular disease, unspecified: Secondary | ICD-10-CM

## 2021-09-11 DIAGNOSIS — I2510 Atherosclerotic heart disease of native coronary artery without angina pectoris: Secondary | ICD-10-CM

## 2021-09-11 DIAGNOSIS — Z9861 Coronary angioplasty status: Secondary | ICD-10-CM

## 2021-09-11 DIAGNOSIS — K0253 Dental caries on pit and fissure surface penetrating into pulp: Secondary | ICD-10-CM | POA: Insufficient documentation

## 2021-09-11 DIAGNOSIS — E785 Hyperlipidemia, unspecified: Secondary | ICD-10-CM

## 2021-09-11 DIAGNOSIS — E039 Hypothyroidism, unspecified: Secondary | ICD-10-CM

## 2021-09-11 MED ORDER — EMPAGLIFLOZIN 10 MG PO TABS: 10.0000 mg | ORAL_TABLET | Freq: Every day | ORAL | 2 refills | Status: DC

## 2021-09-11 MED ORDER — GLIPIZIDE-METFORMIN HCL 5-500 MG PO TABS: 1.0000 | ORAL_TABLET | Freq: Every day | ORAL | 0 refills | Status: DC

## 2021-09-11 NOTE — Assessment & Plan Note (Signed)
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

## 2021-09-11 NOTE — Assessment & Plan Note (Signed)
[facility] On Praluent as he did not tolerate statin in the past 

## 2021-09-11 NOTE — Patient Instructions (Signed)
Please start taking Jardiance for diabetes. Please continue taking Glipizide-Metformin.  Please continue to take other medications as prescribed.

## 2021-09-11 NOTE — Assessment & Plan Note (Signed)
BP Readings from Last 1 Encounters:  09/11/21 128/72   Well-controlled Counseled for compliance with the medications Advised DASH diet and moderate exercise/walking

## 2021-09-11 NOTE — Assessment & Plan Note (Signed)
Well controlled with omeprazole currently

## 2021-09-11 NOTE — Progress Notes (Signed)
New Patient Office Visit  Subjective:  Patient ID: [name], male    DOB: 08-Sep-1945  Age: 76 y.o. MRN: [id number]  CC:  Chief Complaint  Patient presents with   New Patient (Initial Visit)    New patient gosrani pt just establishing care     HPI [name] is a 76 y.o. male with past medical history of HTN, CAD, PAD, type II DM, GERD, HLD and ?Hypothyroidism who presents for establishing care.  CAD, PAD and HTN: He has had cardiac stents and stent in LLE.  He follows up with Dr. Gwenlyn Found for it.  He takes aspirin, Plavix and Praluent for it.  He takes lisinopril and metoprolol as well.  He denies any chest pain, dyspnea or palpitations currently.  He is on Imdur and Ranexa for angina.  Type II DM: He is on Metaglip 5-500 mg QD currently.  His last HbA1c was 7.1 at [facility].  He used to take Metaglip BID in the past, which caused episodes of hypoglycemia.  His blood glucose ranges around 150 most of the time now.  He denies any polyuria or polydipsia currently.  He takes Prilosec for GERD.  Denies any abdominal pain, nausea or vomiting currently.  He was started on NP thyroid even though his TSH was normal.  [facility] his dose [facility] NP thyroid recently.  He denies any fatigue, recent change in weight or appetite.  Denies any tremors or palpitations currently.  He is up to date with COVID and flu vaccine.  He received PPSV23 in the office today.  Past Medical History:  Diagnosis Date   CHF (congestive heart failure) ([place])    Coronary artery disease 08/28/2010   s/p multiple caths 2012, BMS PCI OM1 on August 28, 2010-during NSTEMI; January 2019 non-STEMI- occlusion of OM stent (very late stent thrombosis), initial wire crossed with PTCA, but unable to rewire, PCI aborted--> plan medical therapy, normal EF   Diabetes mellitus    GERD (gastroesophageal reflux disease)    Hyperlipidemia    Hypertension    Non-STEMI (non-ST elevated myocardial infarction) [facility]) January  2012 and 2019   a) Jan 2012: 99% OM1 - BMS PCI; b) Jan 2019: Very late stent thrombosis/100% OM1 -after initially causing him for repeat PTCA restoring flow, unable to recross to place stent. - >  Medical therapy.   NSTEMI (non-ST elevated myocardial infarction) ([place])    Admitted 08/29/17 with NSTEMI-Troponin peak 8.8, normal LVF   PVD (peripheral vascular disease) (HCC)    left SFA PTA & stenting in 02/2005 (Dr. Adora Fridge)   Vitamin D deficiency disease 05/04/2019    Past Surgical History:  Procedure Laterality Date   BIOPSY  08/02/2019   Procedure: BIOPSY;  Surgeon: Daneil Dolin, MD;  [facility];  Service: Endoscopy;;  gastric    CARDIAC CATHETERIZATION  12/23/2004   normal L main, normal LAD, normal L Cfx, RCA with 20% hypodense lesion in first end of vessel (Dr. Adora Fridge)   [place]  08/26/2007   no significant CAD by cath, EF 50% (Dr. Jackie Plum)   COLONOSCOPY  12/2009   Dr. Hampton Abbot   COLONOSCOPY N/A 01/30/2017   pancolonic diverticulosis, non-bleeding internal hemorrhoids.   COLONOSCOPY WITH PROPOFOL N/A 09/08/2019   Procedure: COLONOSCOPY WITH PROPOFOL;  Surgeon: Daneil Dolin, MD;  [facility];  Service: Endoscopy;  Laterality: N/A;  11:15am   CORONARY BALLOON ANGIOPLASTY N/A 08/30/2017   Procedure: CORONARY  BALLOON ANGIOPLASTY;  Surgeon: [name], MD;  Location: [place] CV LAB;  Service: Cardiovascular;  100% CTO very late stent thrombosis OM1 -> initially crossed with PTCA, but then unable to recross after losing my positioning.  PTCA ABORTED.  UNSUCCESSFUL ATTEMPT   CORONARY BALLOON ANGIOPLASTY  02/10/2011   95% prox in-stent restenosis within OM stent - opened with cutting balloon (Dr. Corky Downs)   CORONARY STENT INTERVENTION  07/17/2011   in-stent restenosis - re-stented with Promus 2.25x6mm DES (Dr. Roni Bread)   [place]  08/28/2010   NSTEMI: OM1 99% BMS PCI 2.0x42mm MiniVision BMS (Dr. 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 Adora Fridge)   LEFT HEART CATH AND CORONARY ANGIOGRAPHY N/A 08/30/2017   Procedure: LEFT HEART CATH AND CORONARY ANGIOGRAPHY;  Surgeon: [name], MD;  Location: [place] CV LAB;  Service: Cardiovascular;  100% very late stent thrombosis of Overlapped BMS-DES OM1 -> attempted PTCA   LEFT HEART CATH AND CORONARY ANGIOGRAPHY  08/28/2010   NSTEMI: OM1 99% BMS PCI  (Dr. Adora Fridge)   LEFT HEART CATH AND CORONARY ANGIOGRAPHY  09/11/2010   patent stent (Dr. Roni Bread)   [place] CATH AND CORONARY ANGIOGRAPHY  02/10/2011   95% prox in-stent restenosis within OM stent  (Dr. Corky Downs)   LEFT HEART CATHETERIZATION WITH CORONARY ANGIOGRAM N/A 07/17/2011   Procedure: LEFT HEART CATHETERIZATION WITH CORONARY ANGIOGRAM;  Surgeon: Leonie Man, MD;  Location: [facility] CATH LAB;  Service: Cardiovascular;  Laterality: N/A;  Right radial approach;  90% ISR of BMS (5 months post PTCA for ISR) --> DES PCI   left knee arthroscopy  05/2016   NM MYOCAR PERF WALL MOTION  09/08/2013   abnormal lexiscan - low to intermediate risk;    POLYPECTOMY  09/08/2019   Procedure: POLYPECTOMY;  Surgeon: Daneil Dolin, MD;  [facility];  Service: Endoscopy;;   TOOTH EXTRACTION Right 06/19/2020   TRANSTHORACIC ECHOCARDIOGRAM  09/01/2017   Normal LV size and function.  EF 66 5%.  Normal wall motion.  GR 1 DD.  Mild aortic sclerosis.  Aortic root mildly dilated at 40 mm.    Family History   Problem Relation Age of Onset   Arrhythmia Mother 76   Colon cancer Neg Hx    Liver disease Neg Hx    Inflammatory bowel disease Neg Hx    Colon polyps Neg Hx     Social History   Socioeconomic History   Marital status: Married    Spouse name: Not on file   Number of children: 3   Years of education: Not on file   Highest education level: Not on file  Occupational History   Occupation: Retired    Fish farm manager: [employer]  Tobacco Use   Smoking status: Former    Years: 50.00    Types: Cigarettes    Quit date: 05/12/1999    Years since quitting: 22.3   Smokeless tobacco: Never  Vaping Use   Vaping Use: Never used  Substance and Sexual Activity  Alcohol use: No   Drug use: No   Sexual activity: Never  Other Topics Concern   Not on file  Social History Narrative   Married for 49 years.Lives with wife.Retired,ex-lab Merchant navy officer.Ex-Marine,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   Social Determinants of Health   Financial Resource Strain: Not on file  Food Insecurity: Not on file  Transportation Needs: Not on file  Physical Activity: Not on file  Stress: Not on file  Social Connections: Not on file  Intimate Partner Violence: Not on file    ROS Review of Systems  Constitutional:  Negative for chills and fever.  HENT:  Negative for congestion and sore throat.   Eyes:  Negative for pain and discharge.  Respiratory:  Negative for cough and shortness of breath.   Cardiovascular:  Negative for chest pain and palpitations.  Gastrointestinal:  Negative for diarrhea, nausea and vomiting.  Endocrine: Negative for polydipsia and polyuria.  Genitourinary:  Negative for dysuria and hematuria.  Musculoskeletal:  Negative for neck pain and neck stiffness.  Skin:  Negative for rash.  Neurological:  Negative for dizziness, weakness, numbness and headaches.  Psychiatric/Behavioral:  Negative for agitation and behavioral problems.    Objective:   Today's Vitals: BP 128/72 (BP Location:  Right Arm, Patient Position: Sitting, Cuff Size: Normal)    Pulse (!) 59    Resp 16    Ht 6' (1.829 m)    Wt 216 lb 0.6 oz (98 kg)    SpO2 97%    BMI 29.30 kg/m   Physical Exam Vitals reviewed.  Constitutional:      General: He is not in acute distress.    Appearance: He is not diaphoretic.  HENT:     Head: Normocephalic and atraumatic.     Nose: Nose normal.     Mouth/Throat:     Mouth: Mucous membranes are moist.  Eyes:     General: No scleral icterus.    Extraocular Movements: Extraocular movements intact.  Cardiovascular:     Rate and Rhythm: Normal rate and regular rhythm.     Pulses: Normal pulses.     Heart sounds: Normal heart sounds. No murmur heard. Pulmonary:     Breath sounds: Normal breath sounds. No wheezing or rales.  Abdominal:     Palpations: Abdomen is soft.     Tenderness: There is no abdominal tenderness.  Musculoskeletal:     Cervical back: Neck supple. No tenderness.     Right lower leg: No edema.     Left lower leg: No edema.  Skin:    General: Skin is warm.     Findings: No rash.  Neurological:     General: No focal deficit present.     Mental Status: He is alert and oriented to person, place, and time.     Sensory: No sensory deficit.     Motor: No weakness.  Psychiatric:        Mood and Affect: Mood normal.        Behavior: Behavior normal.    Assessment & Plan:   Problem List Items Addressed This Visit       Cardiovascular and Mediastinum   Peripheral vascular disease ([place]) - Primary (Chronic)    Has had stent placed in LLE Follows up with interventional cardiology On aspirin, Plavix and Praluent      CAD S/P percutaneous coronary angioplasty (Chronic)    S/p stent placement Followed by Dr. 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 Advised to follow diabetic diet On statin and ACEi F/u CMP and lipid panel Diabetic foot exam: Today Diabetic eye exam: Advised to follow up with Ophthalmology for diabetic eye exam       Relevant Medications   empagliflozin (JARDIANCE) 10 MG TABS tablet   glipiZIDE-metformin (METAGLIP) 5-500 MG tablet     Digestive   GERD (gastroesophageal reflux disease)    Well controlled with omeprazole currently      Relevant Medications   omeprazole (PRILOSEC) 20 MG capsule     Endocrine   Hypothyroidism    Was started on NP thyroid even though his TSH was wnl [facility] his NP thyroid to 60 mg, which is appropriate Check TSH and free T4 in the next visit      Relevant Medications   thyroid (ARMOUR) 60 MG tablet     Other   Hyperlipidemia with target LDL less than 70 (Chronic)    Lipid profile reviewed from [place] chart On Praluent as he did not tolerate statin in the past      Other Visit Diagnoses     Need for pneumococcal vaccination       Relevant Orders   Pneumococcal polysaccharide vaccine 23-valent greater than or equal to 2yo subcutaneous/IM (Completed)       Outpatient Encounter Medications as of 09/11/2021  Medication Sig   acetaminophen (TYLENOL) 500 MG tablet Take 1,000 mg by mouth as needed for moderate pain.    Alirocumab (PRALUENT) 75 MG/ML SOAJ Inject 1 Dose into the skin every 14 (fourteen) days.   aspirin 81 MG chewable tablet Chew 1 tablet (81 mg total) by mouth daily.   bisacodyl (DULCOLAX) 5 MG EC tablet Take 5 mg by mouth every other day.   Cholecalciferol  (VITAMIN D3) 50 MCG (2000 UT) TABS Take 3 tablets by mouth daily.   clopidogrel (PLAVIX) 75 MG tablet TAKE ONE TABLET BY MOUTH ONCE DAILY.   cyclobenzaprine (FLEXERIL) 10 MG tablet Take 1 tablet by mouth as needed.   dextromethorphan-guaiFENesin (MUCINEX DM) 30-600 MG 12hr tablet Take 1 tablet by mouth as needed.    diclofenac Sodium (VOLTAREN) 1 % GEL Apply topically as needed.   docusate sodium (COLACE) 100 MG capsule Take 200 mg by mouth every other day.   empagliflozin (JARDIANCE) 10 MG TABS tablet Take 1 tablet (10 mg total) by mouth daily before breakfast.   fexofenadine (ALLEGRA) 180 MG tablet Take 180 mg by mouth as needed for allergies or rhinitis. Rotates with Claritin.   isosorbide mononitrate (IMDUR) 60 MG 24 hr tablet Take 1 tablet (60 mg total) by mouth daily.   lisinopril (PRINIVIL,ZESTRIL) 5 MG tablet Take 5 mg by mouth daily.   metoprolol succinate (TOPROL-XL) 25 MG 24 hr tablet TAKE 2 TABLETS BY MOUTH ONCE DAILY.   nitroGLYCERIN (NITROSTAT) 0.4 MG SL tablet DISSOLVE 1 TABLET UNDER TONGUE EVERY 5 MINUTES UP TO 15 MIN FOR CHEST PAIN. IF NO RELIEF  CALL 911.   omeprazole (PRILOSEC) 20 MG capsule Take 20 mg by mouth daily.   ONETOUCH ULTRA test strip USE AS DIRECTED UP TO 3 TIMES DAILY IF NEEDED.   ranolazine (RANEXA) 500 MG 12 hr tablet Take 500 mg by mouth 2 (two) times daily.   sucralfate (CARAFATE) 1 g tablet Take 1 tablet (1 g total) by mouth 4 (four) times daily as needed.   thyroid (ARMOUR) 60 MG tablet Take 60 mg by mouth daily before breakfast.   [DISCONTINUED] dexlansoprazole (DEXILANT) 60 MG capsule Take 1 capsule (60 mg total) by mouth daily.   [DISCONTINUED] glipiZIDE-metformin (METAGLIP) 5-500 MG tablet Take 1 tablet by mouth daily at 12 noon.   [DISCONTINUED] NP THYROID 90 MG tablet Take 1 tablet (90 mg total) by mouth daily.   [DISCONTINUED] pantoprazole (PROTONIX) 40 MG tablet Take 40 mg by mouth as needed.    glipiZIDE-metformin (METAGLIP) 5-500 MG tablet Take 1  tablet by mouth daily at 12 noon.   [DISCONTINUED] omeprazole (PRILOSEC) 20 MG capsule Take 1 capsule (20 mg total) by mouth daily.   No facility-administered encounter medications on file as of 09/11/2021.    Follow-up: Return in about 3 months (around 12/09/2021) for HTN, DM and hypothyroidism.   Lindell Spar, MD

## 2021-09-11 NOTE — Assessment & Plan Note (Signed)
Was started on NP thyroid even though his TSH was wnl [facility] his NP thyroid to 60 mg, which is appropriate Check TSH and free T4 in the next visit

## 2021-09-11 NOTE — Assessment & Plan Note (Addendum)
S/p stent placement Followed by Dr. Berry-cardiology On aspirin, Plavix and Praluent On beta-blocker, Imdur and Ranexa Denies any chest pain currently 

## 2021-09-11 NOTE — Assessment & Plan Note (Addendum)
Has had stent placed in LLE Follows up with interventional cardiology On aspirin, Plavix and Praluent 

## 2021-09-12 ENCOUNTER — Ambulatory Visit (INDEPENDENT_AMBULATORY_CARE_PROVIDER_SITE_OTHER): Payer: HMO

## 2021-09-12 DIAGNOSIS — Z0000 Encounter for general adult medical examination without abnormal findings: Secondary | ICD-10-CM | POA: Diagnosis not present

## 2021-09-12 NOTE — Patient Instructions (Signed)

## 2021-09-12 NOTE — Progress Notes (Signed)
Subjective:   [name] is a 76 y.o. male who presents for an Initial Medicare Annual Wellness Visit. I connected with  [name] on 09/12/21 by a audio enabled telemedicine application and verified that I am speaking with the correct person using two identifiers.  Patient Location: Home  Provider Location: Office/Clinic  I discussed the limitations of evaluation and management by telemedicine. The patient expressed understanding and agreed to proceed. iew of Systems    Defer to PCP       Objective:    Today's Vitals   09/12/21 0833  PainSc: 0-No pain   There is no height or weight on file to calculate BMI.  Advanced Directives 09/12/2021 05/22/2021 09/06/2019 08/02/2019 08/29/2017 01/30/2017 09/07/2016  Does Patient Have a Medical Advance Directive? No No No No No No No  Would patient like information on creating a medical advance directive? No - Patient declined - No - Patient declined No - Patient declined No - Patient declined No - Patient declined -  Pre-existing out of facility DNR order (yellow form or pink MOST form) - - - - - - -    Current Medications (verified) Outpatient Encounter Medications as of 09/12/2021  Medication Sig   acetaminophen (TYLENOL) 500 MG tablet Take 1,000 mg by mouth as needed for moderate pain.    Alirocumab (PRALUENT) 75 MG/ML SOAJ Inject 1 Dose into the skin every 14 (fourteen) days.   aspirin 81 MG chewable tablet Chew 1 tablet (81 mg total) by mouth daily.   bisacodyl (DULCOLAX) 5 MG EC tablet Take 5 mg by mouth every other day.   Cholecalciferol (VITAMIN D3) 50 MCG (2000 UT) TABS Take 3 tablets by mouth daily.   clopidogrel (PLAVIX) 75 MG tablet TAKE ONE TABLET BY MOUTH ONCE DAILY.   cyclobenzaprine (FLEXERIL) 10 MG tablet Take 1 tablet by mouth as needed.   dextromethorphan-guaiFENesin (MUCINEX DM) 30-600 MG 12hr tablet Take 1 tablet by mouth as needed.    diclofenac Sodium (VOLTAREN) 1 % GEL Apply topically as needed.   docusate  sodium (COLACE) 100 MG capsule Take 200 mg by mouth every other day.   empagliflozin (JARDIANCE) 10 MG TABS tablet Take 1 tablet (10 mg total) by mouth daily before breakfast.   fexofenadine (ALLEGRA) 180 MG tablet Take 180 mg by mouth as needed for allergies or rhinitis. Rotates with Claritin.   glipiZIDE-metformin (METAGLIP) 5-500 MG tablet Take 1 tablet by mouth daily at 12 noon.   isosorbide mononitrate (IMDUR) 60 MG 24 hr tablet Take 1 tablet (60 mg total) by mouth daily.   lisinopril (PRINIVIL,ZESTRIL) 5 MG tablet Take 5 mg by mouth daily.   metoprolol succinate (TOPROL-XL) 25 MG 24 hr tablet TAKE 2 TABLETS BY MOUTH ONCE DAILY.   nitroGLYCERIN (NITROSTAT) 0.4 MG SL tablet DISSOLVE 1 TABLET UNDER TONGUE EVERY 5 MINUTES UP TO 15 MIN FOR CHEST PAIN. IF NO RELIEF CALL 911.   omeprazole (PRILOSEC) 20 MG capsule Take 20 mg by mouth daily.   ONETOUCH ULTRA test strip USE AS DIRECTED UP TO 3 TIMES DAILY IF NEEDED.   ranolazine (RANEXA) 500 MG 12 hr tablet Take 500 mg by mouth 2 (two) times daily.   sucralfate (CARAFATE) 1 g tablet Take 1 tablet (1 g total) by mouth 4 (four) times daily as needed.   thyroid (ARMOUR) 60 MG tablet Take 60 mg by mouth daily before breakfast.   No facility-administered encounter medications on file as of 09/12/2021.    Allergies (verified)  Crestor [rosuvastatin], Propoxyphene n-acetaminophen, Statins, Tape, Zetia [ezetimibe], Zocor [simvastatin], Flomax [tamsulosin hcl], Flomax [tamsulosin], Levaquin [levofloxacin hemihydrate], Levofloxacin, Penicillin g, and Penicillins   History: Past Medical History:  Diagnosis Date   CHF (congestive heart failure) ([place])    Coronary artery disease 08/28/2010   s/p multiple caths 2012, BMS PCI OM1 on August 28, 2010-during NSTEMI; January 2019 non-STEMI- occlusion of OM stent (very late stent thrombosis), initial wire crossed with PTCA, but unable to rewire, PCI aborted--> plan medical therapy, normal EF   Diabetes mellitus     GERD (gastroesophageal reflux disease)    Hyperlipidemia    Hypertension    Non-STEMI (non-ST elevated myocardial infarction) [facility]) January 2012 and 2019   a) Jan 2012: 99% OM1 - BMS PCI; b) Jan 2019: Very late stent thrombosis/100% OM1 -after initially causing him for repeat PTCA restoring flow, unable to recross to place stent. - >  Medical therapy.   NSTEMI (non-ST elevated myocardial infarction) ([place])    Admitted 08/29/17 with NSTEMI-Troponin peak 8.8, normal LVF   PVD (peripheral vascular disease) ([facility])    left SFA PTA & stenting in 02/2005 (Dr. Adora Fridge)   Vitamin D deficiency disease 05/04/2019   Past Surgical History:  Procedure Laterality Date   BIOPSY  08/02/2019   Procedure: BIOPSY;  Surgeon: Daneil Dolin, MD;  [facility];  Service: Endoscopy;;  gastric    CARDIAC CATHETERIZATION  12/23/2004   normal L main, normal LAD, normal L Cfx, RCA with 20% hypodense lesion in first end of vessel (Dr. Adora Fridge)   [place]  08/26/2007   no significant CAD by cath, EF 50% (Dr. Jackie Plum)   COLONOSCOPY  12/2009   Dr. Hampton Abbot   COLONOSCOPY N/A 01/30/2017   pancolonic diverticulosis, non-bleeding internal hemorrhoids.   COLONOSCOPY WITH PROPOFOL N/A 09/08/2019   Procedure: COLONOSCOPY WITH PROPOFOL;  Surgeon: Daneil Dolin, MD;  [facility];  Service: Endoscopy;  Laterality: N/A;  11:15am   CORONARY BALLOON ANGIOPLASTY N/A 08/30/2017   Procedure: CORONARY BALLOON ANGIOPLASTY;  Surgeon: [name], MD;  Location: [place] [facility] LAB;  Service: Cardiovascular;  100% CTO very late stent thrombosis OM1 -> initially crossed with PTCA, but then unable to recross after losing my positioning.  PTCA ABORTED.  UNSUCCESSFUL ATTEMPT   CORONARY BALLOON ANGIOPLASTY  02/10/2011   95% prox in-stent restenosis within OM stent - opened with cutting balloon (Dr. Corky Downs)   CORONARY STENT INTERVENTION  07/17/2011   in-stent restenosis - re-stented with Promus  2.25x94mm DES (Dr. Roni Bread)   [place]  08/28/2010   NSTEMI: OM1 99% BMS PCI 2.0x43mm MiniVision BMS (Dr. 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 Adora Fridge)   LEFT HEART CATH AND CORONARY ANGIOGRAPHY N/A 08/30/2017   Procedure: LEFT HEART CATH AND CORONARY ANGIOGRAPHY;  Surgeon: [name], MD;  Location: [place] CV LAB;  Service: Cardiovascular;  100% very late stent thrombosis of Overlapped BMS-DES OM1 -> attempted PTCA   LEFT HEART CATH AND CORONARY ANGIOGRAPHY  08/28/2010  NSTEMI: OM1 99% BMS PCI  (Dr. Adora Fridge)   [place] CATH AND CORONARY ANGIOGRAPHY  09/11/2010   patent stent (Dr. Roni Bread)   [place] CATH AND CORONARY ANGIOGRAPHY  02/10/2011   95% prox in-stent restenosis within OM stent  (Dr. Corky Downs)   LEFT HEART CATHETERIZATION WITH CORONARY ANGIOGRAM N/A 07/17/2011   Procedure: LEFT HEART CATHETERIZATION WITH CORONARY ANGIOGRAM;  Surgeon: Leonie Man, MD;  Location: [facility] CATH LAB;  Service: Cardiovascular;  Laterality: N/A;  Right radial approach;  90% ISR of BMS (5 months post PTCA for ISR) --> DES PCI   left knee arthroscopy  05/2016   NM MYOCAR PERF WALL MOTION  09/08/2013   abnormal lexiscan - low to intermediate risk;    POLYPECTOMY  09/08/2019   Procedure: POLYPECTOMY;  Surgeon: Daneil Dolin, MD;  [facility];  Service: Endoscopy;;   TOOTH EXTRACTION Right 06/19/2020   TRANSTHORACIC ECHOCARDIOGRAM  09/01/2017   Normal LV size  and function.  EF 66 5%.  Normal wall motion.  GR 1 DD.  Mild aortic sclerosis.  Aortic root mildly dilated at 40 mm.   Family History  Problem Relation Age of Onset   Arrhythmia Mother 66   Early death Son    Colon cancer Neg Hx    Liver disease Neg Hx    Inflammatory bowel disease Neg Hx    Colon polyps Neg Hx    Social History   Socioeconomic History   Marital status: Married    Spouse name: Mechele Claude   Number of children: 3   Years of education: 12   Highest education level: Some college, no degree  Occupational History   Occupation: Retired    Fish farm manager: [employer]  Tobacco Use   Smoking status: Former    Packs/day: 1.00    Years: 30.00    Pack years: 30.00    Types: Cigarettes    Quit date: 05/12/1999    Years since quitting: 22.3   Smokeless tobacco: Never  Vaping Use   Vaping Use: Never used  Substance and Sexual Activity   Alcohol use: No   Drug use: No   Sexual activity: Yes  Other Topics Concern   Not on file  Social History Narrative   Married for 49 years.Lives with wife.Retired,ex-lab Merchant navy officer.Ex-Marine,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   Social Determinants of Health   Financial Resource Strain: Low Risk    Difficulty of Paying Living Expenses: Not hard at all  Food Insecurity: No Food Insecurity   Worried About Charity fundraiser in the Last Year: Never true   Arboriculturist in the Last Year: Never true  Transportation Needs: No Transportation Needs   Lack of Transportation (Medical): No   Lack of Transportation (Non-Medical): No  Physical Activity: Sufficiently Active   Days of Exercise per Week: 7 days   Minutes of Exercise per Session: 60 min  Stress: No Stress Concern Present   Feeling of Stress : Not at all  Social Connections: Socially Integrated   Frequency of Communication with Friends and Family: More than three times a week   Frequency of Social Gatherings with Friends and Family: Three times a week   Attends Religious Services: More  than 4 times per year   Active Member of Clubs or Organizations: Yes   Attends Archivist Meetings: More than 4 times per year   Marital Status: Married    Tobacco Counseling Counseling given: Not Answered   Clinical  Intake:  Pre-visit preparation completed: No  Pain : No/denies pain Pain Score: 0-No pain     Nutritional Risks: None Diabetes: Yes CBG done?: No Did pt. bring in CBG monitor from home?: No  How often do you need to have someone help you when you read instructions, pamphlets, or other written materials from your doctor or pharmacy?: 1 - Never What is the last grade level you completed in school?: 12  Diabetic?Nutrition Risk Assessment:  Has the patient had any N/V/D within the last 2 months?  No  Does the patient have any non-healing wounds?  No  Has the patient had any unintentional weight loss or weight gain?  No   Diabetes:  Is the patient diabetic?  No  If diabetic, was a CBG obtained today?  No  Did the patient bring in their glucometer from home?  No  How often do you monitor your CBG's? 3 times daily.   Financial Strains and Diabetes Management:  Are you having any financial strains with the device, your supplies or your medication? No .  Does the patient want to be seen by Chronic Care Management for management of their diabetes?  No  Would the patient like to be referred to a Nutritionist or for Diabetic Management?  No   Diabetic Exams:  Diabetic Eye Exam: Overdue for diabetic eye exam. Pt has been advised about the importance in completing this exam. Patient advised to call and schedule an eye exam. Diabetic Foot Exam: Completed 09/11/2021    Interpreter Needed?: No  Information entered by :: Judeen Hammans   Activities of Daily Living In your present state of health, do you have any difficulty performing the following activities: 09/12/2021 01/02/2021  Hearing? N N  Vision? N Y  Difficulty concentrating or making decisions? N N   Walking or climbing stairs? N N  Dressing or bathing? N N  Doing errands, shopping? N N  Preparing Food and eating ? N -  Using the Toilet? N -  In the past six months, have you accidently leaked urine? N -  Do you have problems with loss of bowel control? N -  Managing your Medications? N -  Managing your Finances? N -  Some recent data might be hidden    Patient Care Team: Lindell Spar, MD as PCP - General (Internal Medicine) [name], MD as PCP - Cardiology (Cardiology) Warden Fillers, MD as Consulting Physician (Ophthalmology) [facility]  Indicate any recent Medical Services you may have received from other than [facility] providers in the past year (date may be approximate).     Assessment:   This is a routine wellness examination for [place].  Hearing/Vision screen No results found.  Dietary issues and exercise activities discussed: Current Exercise Habits: Home exercise routine, Type of exercise: strength training/weights;walking, Time (Minutes): 60, Frequency (Times/Week): 6, Weekly Exercise (Minutes/Week): 360   Goals Addressed   None   Depression Screen PHQ 2/9 Scores 09/12/2021 09/11/2021 10/02/2020 06/20/2020 08/17/2019  PHQ - 2 Score 0 0 0 0 0  PHQ- 9 Score - - 0 0 -    Fall Risk Fall Risk  09/12/2021 09/11/2021 10/02/2020 06/20/2020 08/17/2019  Falls in the past year? 0 0 1 0 0  Number falls in past yr: 0 0 0 - 0  Injury with Fall? 0 0 1 - 0  Risk for fall due to : No Fall Risks No Fall Risks - - -  Follow up - Falls evaluation completed - - -  FALL RISK PREVENTION PERTAINING TO THE HOME:  Any stairs in or around the home? Yes  If so, are there any without handrails? Yes  Home free of loose throw rugs in walkways, pet beds, electrical cords, etc? Yes  Adequate lighting in your home to reduce risk of falls? Yes   ASSISTIVE DEVICES UTILIZED TO PREVENT FALLS:  Life alert? No  Use of a cane, walker or w/c? No  Grab bars in the  bathroom? No  Shower chair or bench in shower? No  Elevated toilet seat or a handicapped toilet? No    Cognitive Function:     6CIT Screen 09/12/2021  What Year? 0 points  What month? 0 points  What time? 0 points  Count back from 20 0 points  Months in reverse 0 points  Repeat phrase 0 points  Total Score 0    Immunizations Immunization History  Administered Date(s) Administered   Fluad Quad(high Dose 65+) 05/26/2019, 06/20/2020, 05/23/2021   Influenza Split 05/12/2015   Influenza, High Dose Seasonal PF 05/11/2017   Influenza,inj,Quad PF,6+ Mos 07/20/2018   Influenza-Unspecified 05/12/2019   Moderna Covid-19 Vaccine Bivalent Booster 87yrs & up 06/13/2021   Moderna SARS-COV2 Booster Vaccination 11/21/2020   Moderna Sars-Covid-2 Vaccination 09/23/2019, 10/21/2019, 06/15/2020, 06/25/2020, 11/21/2020   Pneumococcal Conjugate-13 11/09/2015   Pneumococcal Polysaccharide-23 09/11/2021   Tdap 01/10/2012, 06/20/2020   Zoster Recombinat (Shingrix) 09/07/2020, 11/19/2020, 11/23/2020    TDAP status: Up to date  Flu Vaccine status: Up to date  Pneumococcal vaccine status: Up to date  Covid-19 vaccine status: Information provided on how to obtain vaccines.   Qualifies for Shingles Vaccine? Yes   Zostavax completed No   Shingrix Completed?: Yes  Screening Tests Health Maintenance  Topic Date Due   OPHTHALMOLOGY EXAM  09/11/2021   HEMOGLOBIN A1C  03/02/2022   FOOT EXAM  09/11/2022   COLONOSCOPY (Pts 45-67yrs Insurance coverage will need to be confirmed)  09/07/2029   TETANUS/TDAP  06/20/2030   Pneumonia Vaccine 27+ Years old  Completed   INFLUENZA VACCINE  Completed   COVID-19 Vaccine  Completed   Hepatitis C Screening  Completed   Zoster Vaccines- Shingrix  Completed   HPV VACCINES  Aged Out    Health Maintenance  Health Maintenance Due  Topic Date Due   OPHTHALMOLOGY EXAM  09/11/2021    Colorectal cancer screening: Type of screening: Colonoscopy. Completed  09/08/2019. Repeat every 10 years  Lung Cancer Screening: (Low Dose CT Chest recommended if Age 75-80 years, 30 pack-year currently smoking OR have quit w/in 15years.) does qualify.   Lung Cancer Screening Referral: PT REFUSES  Additional Screening:  Hepatitis C Screening: does qualify; Completed 10/23/2020  Vision Screening: Recommended annual ophthalmology exams for early detection of glaucoma and other disorders of the eye. Is the patient up to date with their annual eye exam?  Yes  Who is the provider or what is the name of the office in which the patient attends annual eye exams? Dr Katy Fitch If pt is not established with a provider, would they like to be referred to a provider to establish care? No .   Dental Screening: Recommended annual dental exams for proper oral hygiene  Community Resource Referral / Chronic Care Management: CRR required this visit?  No   CCM required this visit?  No      Plan:     I have personally reviewed and noted the following in the patients chart:   Medical and social history Use of alcohol, tobacco or  illicit drugs  Current medications and supplements including opioid prescriptions. Patient is not currently taking opioid prescriptions. Functional ability and status Nutritional status Physical activity Advanced directives List of other physicians Hospitalizations, surgeries, and ER visits in previous 12 months Vitals Screenings to include cognitive, depression, and falls Referrals and appointments  In addition, I have reviewed and discussed with patient certain preventive protocols, quality metrics, and best practice recommendations. A written personalized care plan for preventive services as well as general preventive health recommendations were provided to patient.     Earline Mayotte, [place]   09/12/2021   Nurse Notes:  Mr. Thoma , Thank you for taking time to come for your Medicare Wellness Visit. I appreciate your ongoing commitment to  your health goals. Please review the following plan we discussed and let me know if I can assist you in the future.   These are the goals we discussed:  Goals   None     This is a list of the screening recommended for you and due dates:  Health Maintenance  Topic Date Due   Eye exam for diabetics  09/11/2021   Hemoglobin A1C  03/02/2022   Complete foot exam   09/11/2022   Colon Cancer Screening  09/07/2029   Tetanus Vaccine  06/20/2030   Pneumonia Vaccine  Completed   Flu Shot  Completed   COVID-19 Vaccine  Completed   Hepatitis C Screening: USPSTF Recommendation to screen - Ages 18-79 yo.  Completed   Zoster (Shingles) Vaccine  Completed   HPV Vaccine  Aged Out

## 2021-10-01 ENCOUNTER — Other Ambulatory Visit (HOSPITAL_COMMUNITY): Payer: Self-pay | Admitting: Cardiovascular Disease

## 2021-10-01 DIAGNOSIS — I739 Peripheral vascular disease, unspecified: Secondary | ICD-10-CM

## 2021-10-02 ENCOUNTER — Other Ambulatory Visit: Payer: Self-pay | Admitting: Cardiovascular Disease

## 2021-10-02 DIAGNOSIS — Z955 Presence of coronary angioplasty implant and graft: Secondary | ICD-10-CM

## 2021-10-23 LAB — HM DIABETES EYE EXAM

## 2021-10-29 NOTE — Progress Notes (Signed)
? ? ?Office Visit  ?  ?Patient Name: [name] ?Date of Encounter: 10/30/2021 ? ?Primary Care Provider:  Lindell Spar, MD ?Primary Cardiologist:  Quay Burow, MD ? ?Chief Complaint  ?  ?76 year old male with a history of CAD s/p DES-OM1 in 2012, PVD s/p L SFA, PTA/stenting in 2006, hypertension, hyperlipidemia, type 2 diabetes, and GERD who presents for follow-up related to CAD. ? ?Past Medical History  ?  ?Past Medical History:  ?Diagnosis Date  ? CHF (congestive heart failure) ([place])   ? Coronary artery disease 08/28/2010  ? s/p multiple caths 2012, BMS PCI OM1 on August 28, 2010-during NSTEMI; January 2019 non-STEMI- occlusion of OM stent (very late stent thrombosis), initial wire crossed with PTCA, but unable to rewire, PCI aborted--> plan medical therapy, normal EF  ? Diabetes mellitus   ? GERD (gastroesophageal reflux disease)   ? Hyperlipidemia   ? Hypertension   ? Non-STEMI (non-ST elevated myocardial infarction) [facility]) January 2012 and 2019  ? a) Jan 2012: 99% OM1 - BMS PCI; b) Jan 2019: Very late stent thrombosis/100% OM1 -after initially causing him for repeat PTCA restoring flow, unable to recross to place stent. - >  Medical therapy.  ? NSTEMI (non-ST elevated myocardial infarction) ([place])   ? Admitted 08/29/17 with NSTEMI-Troponin peak 8.8, normal LVF  ? PVD (peripheral vascular disease) ([place])   ? left SFA PTA & stenting in 02/2005 (Dr. Adora Fridge)  ? Vitamin D deficiency disease 05/04/2019  ? ?Past Surgical History:  ?Procedure Laterality Date  ? BIOPSY  08/02/2019  ? Procedure: BIOPSY;  Surgeon: Daneil Dolin, MD;  [facility];  Service: Endoscopy;;  gastric ?  ? CARDIAC CATHETERIZATION  12/23/2004  ? normal L main, normal LAD, normal L Cfx, RCA with 20% hypodense lesion in first end of vessel (Dr. Adora Fridge)  ? CARDIAC CATHETERIZATION  08/26/2007  ? no significant CAD by cath, EF 50% (Dr. Jackie Plum)  ? COLONOSCOPY  12/2009  ? Dr. Hampton Abbot  ? COLONOSCOPY N/A 01/30/2017  ?  pancolonic diverticulosis, non-bleeding internal hemorrhoids.  ? COLONOSCOPY WITH PROPOFOL N/A 09/08/2019  ? Procedure: COLONOSCOPY WITH PROPOFOL;  Surgeon: Daneil Dolin, MD;  [facility];  Service: Endoscopy;  Laterality: N/A;  11:15am  ? CORONARY BALLOON ANGIOPLASTY N/A 08/30/2017  ? Procedure: CORONARY BALLOON ANGIOPLASTY;  Surgeon: [name], MD;  Location: [place] CV LAB;  Service: Cardiovascular;  100% CTO very late stent thrombosis OM1 -> initially crossed with PTCA, but then unable to recross after losing my positioning.  PTCA ABORTED.  UNSUCCESSFUL ATTEMPT  ? CORONARY BALLOON ANGIOPLASTY  02/10/2011  ? 95% prox in-stent restenosis within OM stent - opened with cutting balloon (Dr. Corky Downs)  ? CORONARY STENT INTERVENTION  07/17/2011  ? in-stent restenosis - re-stented with Promus 2.25x40m DES (Dr. DRoni Bread  ? CORONARY STENT INTERVENTION  08/28/2010  ? NSTEMI: OM1 99% BMS PCI 2.0x160mMiniVision BMS (Dr. J.Adora Fridge ? ESOPHAGOGASTRODUODENOSCOPY  02/19/10  ? 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 ESOPHAGOGASTRODUODENOSCOPY N/A 08/02/2019  ? Procedure: ESOPHAGOGASTRODUODENOSCOPY (EGD);  Surgeon: RoDaneil DolinMD;  [facility];  Service: Endoscopy;  Laterality: N/A;  8:45am  ? FEMORAL ARTERY STENT  02/27/2005  ? L SFA stenting - Wholey down SFA across lesion - predilatation with 4x4 Powerflex, stenting with 7x4 Smart, post-dilatation with 6x4 powerflex (Dr. J.Adora Fridge ? LEFT HEART CATH AND CORONARY ANGIOGRAPHY  N/A 08/30/2017  ? Procedure: LEFT HEART CATH AND CORONARY ANGIOGRAPHY;  Surgeon: [name], MD;  Location: [place] CV LAB;  Service: Cardiovascular;  100% very late stent thrombosis of Overlapped BMS-DES OM1 -> attempted PTCA  ? LEFT HEART CATH AND CORONARY ANGIOGRAPHY  08/28/2010  ? NSTEMI: OM1 99% BMS PCI  (Dr. Adora Fridge)  ? LEFT HEART CATH AND CORONARY ANGIOGRAPHY   09/11/2010  ? patent stent (Dr. Roni Bread)  ? LEFT HEART CATH AND CORONARY ANGIOGRAPHY  02/10/2011  ? 95% prox in-stent restenosis within OM stent  (Dr. Corky Downs)  ? LEFT HEART CATHETERIZATION WITH CORONARY ANGIOGRAM N/A 07/17/2011  ? Procedure: LEFT HEART CATHETERIZATION WITH CORONARY ANGIOGRAM;  Surgeon: Leonie Man, MD;  Location: [facility] CATH LAB;  Service: Cardiovascular;  Laterality: N/A;  Right radial approach;  90% ISR of BMS (5 months post PTCA for ISR) --> DES PCI  ? left knee arthroscopy  05/2016  ? NM MYOCAR PERF WALL MOTION  09/08/2013  ? abnormal lexiscan - low to intermediate risk;   ? POLYPECTOMY  09/08/2019  ? Procedure: POLYPECTOMY;  Surgeon: Daneil Dolin, MD;  [facility];  Service: Endoscopy;;  ? TOOTH EXTRACTION Right 06/19/2020  ? TRANSTHORACIC ECHOCARDIOGRAM  09/01/2017  ? Normal LV size and function.  EF 66 5%.  Normal wall motion.  GR 1 DD.  Mild aortic sclerosis.  Aortic root mildly dilated at 40 mm.  ? ? ?Allergies ? ?Allergies  ?Allergen Reactions  ? Crestor [Rosuvastatin]   ?  myalgia  ? Propoxyphene N-Acetaminophen Nausea Only  ? Statins Other (See Comments)  ?  Severe muscle cramping/aching/pain/ elevated CK  ? Tape   ?  Blisters  ? Zetia [Ezetimibe]   ?  Muscle cramping  ? Zocor [Simvastatin]   ?  myalgia  ? Flomax [Tamsulosin Hcl] Rash  ? Flomax [Tamsulosin] Rash  ? Levaquin [Levofloxacin Hemihydrate] Rash  ? Levofloxacin Rash  ? Penicillin G Diarrhea and Rash  ? Penicillins Rash  ?  Broke out in rash 6 years ago, pt recently took penicillin (09/2016) and had no reaction ?Has patient had a PCN reaction causing immediate rash, facial/tongue/throat swelling, SOB or lightheadedness with hypotension: Yes ?Has patient had a PCN reaction causing severe rash involving mucus membranes or skin necrosis: Unknown ?Has patient had a PCN reaction that required hospitalization: No ?Has patient had a PCN reaction occurring within the last 10 years: Yes ?If all of the above answers are  "NO", then m  ? ? ?History of Present Illness  ?  ?76 year old male with the above past medical history including CAD, PVD s/p L SFA, PTA/stenting in 2006, hypertension, hyperlipidemia, type 2 diabetes, and GERD who presents for follow-up related to CAD. ? ?History of CAD s/p DES-OM1 in 2012. Low risk nuc 06/2017 with small inferior defect that was felt to be due to tissue attenuation. Cardiac catheterization in 2019 previously placed marginal branch stent, s/p unsuccessful PCI. Lexiscan Myoview in 2021 showed decreased perfusion in 2 areas, fixed defects consistent with prior infarct, not consistent with ischemia. He was last seen in the office on 08/21/2020 and was stable overall from a cardiac standpoint.  He denied symptoms concerning for angina. He has a history of PVD s/p L SFA, PTA/stenting in 2006. Lower extremity Dopplers in January 2023 showed 1 to 49% stenosis in the L SFA stent, normal ABIs, no change from prior study.  Repeat ABIs/lower extremity arterial duplex recommended in 1 year.  He is  on Praluent for hyperlipidemia. ? ?He presents today for follow-up. Since his last visit he has done well from a cardiac standpoint. He denies any symptoms concerning for angina, denies dyspnea, denies claudication.  All, he reports feeling well denies any specific concerns or complaints. ? ?Home Medications  ?  ?Current Outpatient Medications  ?Medication Sig Dispense Refill  ? acetaminophen (TYLENOL) 500 MG tablet Take 1,000 mg by mouth as needed for moderate pain.     ? Alirocumab (PRALUENT) 75 MG/ML SOAJ Inject 1 Dose into the skin every 14 (fourteen) days.    ? aspirin 81 MG chewable tablet Chew 1 tablet (81 mg total) by mouth daily.    ? bisacodyl (DULCOLAX) 5 MG EC tablet Take 5 mg by mouth every other day.    ? Cholecalciferol (VITAMIN D3) 50 MCG (2000 UT) TABS Take 3 tablets by mouth daily.    ? clopidogrel (PLAVIX) 75 MG tablet TAKE ONE TABLET BY MOUTH ONCE DAILY. 30 tablet 9  ? cyclobenzaprine (FLEXERIL)  10 MG tablet Take 1 tablet by mouth as needed.    ? dextromethorphan-guaiFENesin (MUCINEX DM) 30-600 MG 12hr tablet Take 1 tablet by mouth as needed.     ? diclofenac Sodium (VOLTAREN) 1 % GEL Apply topically as needed.

## 2021-10-30 ENCOUNTER — Other Ambulatory Visit: Payer: Self-pay

## 2021-10-30 ENCOUNTER — Ambulatory Visit: Payer: HMO | Admitting: Nurse Practitioner

## 2021-10-30 ENCOUNTER — Encounter: Payer: Self-pay | Admitting: Nurse Practitioner

## 2021-10-30 VITALS — BP 122/70 | HR 56 | Resp 20 | Ht 72.0 in | Wt 210.0 lb

## 2021-10-30 DIAGNOSIS — E785 Hyperlipidemia, unspecified: Secondary | ICD-10-CM | POA: Diagnosis not present

## 2021-10-30 DIAGNOSIS — I10 Essential (primary) hypertension: Secondary | ICD-10-CM | POA: Diagnosis not present

## 2021-10-30 DIAGNOSIS — I2510 Atherosclerotic heart disease of native coronary artery without angina pectoris: Secondary | ICD-10-CM

## 2021-10-30 DIAGNOSIS — I739 Peripheral vascular disease, unspecified: Secondary | ICD-10-CM | POA: Diagnosis not present

## 2021-10-30 DIAGNOSIS — E119 Type 2 diabetes mellitus without complications: Secondary | ICD-10-CM

## 2021-10-30 NOTE — Patient Instructions (Signed)
Medication Instructions:  ?Your physician recommends that you continue on your current medications as directed. Please refer to the Current Medication list given to you today.  ? ?*If you need a refill on your cardiac medications before your next appointment, please call your pharmacy* ? ? ?Lab Work: ?NONE ordered at this time of appointment  ? ?If you have labs (blood work) drawn today and your tests are completely normal, you will receive your results only by: ?MyChart Message (if you have MyChart) OR ?A paper copy in the mail ?If you have any lab test that is abnormal or we need to change your treatment, we will call you to review the results. ? ? ?Testing/Procedures: ?NONE ordered at this time of appointment  ? ? ? ?Follow-Up: ?At [facility], you and your health needs are our priority.  As part of our continuing mission to provide you with exceptional heart care, we have created designated Provider Care Teams.  These Care Teams include your primary Cardiologist (physician) and Advanced Practice Providers (APPs -  Physician Assistants and Nurse Practitioners) who all work together to provide you with the care you need, when you need it. ? ?We recommend signing up for the patient portal called "MyChart".  Sign up information is provided on this After Visit Summary.  MyChart is used to connect with patients for Virtual Visits (Telemedicine).  Patients are able to view lab/test results, encounter notes, upcoming appointments, etc.  Non-urgent messages can be sent to your provider as well.   ?To learn more about what you can do with MyChart, go to [email or web address].   ? ?Your next appointment:   ?1 year(s) ? ?The format for your next appointment:   ?In Person ? ?Provider:   ?Quay Burow, MD   ? ? ? ?

## 2021-10-31 ENCOUNTER — Encounter: Payer: Self-pay | Admitting: *Deleted

## 2021-11-25 ENCOUNTER — Other Ambulatory Visit: Payer: Self-pay | Admitting: Cardiovascular Disease

## 2021-12-06 ENCOUNTER — Encounter: Payer: Self-pay | Admitting: Internal Medicine

## 2021-12-06 DIAGNOSIS — M609 Myositis, unspecified: Secondary | ICD-10-CM

## 2021-12-06 NOTE — Progress Notes (Signed)
[facility])  ?                                          [facility] Quality Pharmacy Team  ?                                      Statin Quality Measure Assessment ?  ? ?12/06/2021 ? ?BYNUM MCCULLARS ?1945/10/18 ?[id number] ? ?Dr. Posey Pronto,  ? ?I am a [facility] clinical pharmacist that reviews patients for statin quality initiatives.    ? ?Per review of chart and payor information, patient has a diagnosis of diabetes and cardiovascular disease but is not currently filling a statin prescription.  This places patient into the SUPD (Statin Use In Patients with Diabetes) and SPC (Statin Use in Patients with Cardiovascular Disease) measures for CMS.   ? ?Patient has documented allergy to statin but no corresponding CPT codes that would exclude patient from SUPD and [facility] measure. Pt was coded for myositis of thigh in 2021. ? ?   ?Component Value Date/Time  ? CHOL 135 02/04/2021 0000  ? CHOL 131 07/04/2020 0938  ? TRIG 128 02/04/2021 0000  ? HDL 48 02/04/2021 0000  ? HDL 41 07/04/2020 0938  ? CHOLHDL 3.2 07/04/2020 0938  ? CHOLHDL 3.4 12/08/2019 1041  ? VLDL 27 08/30/2017 0315  ? [place] 61 02/04/2021 0000  ? [place] 65 07/04/2020 0938  ? [place] 69 12/08/2019 1041  ? LDLDIRECT 66 04/13/2013 0732  ? ? ?Please consider ONE of the following recommendations:  ? ?Initiate high intensity statin Atorvastatin '40mg'$  once daily, #90, 3 refills  ? Rosuvastatin '20mg'$  once daily, #90, 3 refills  ?  ?Initiate moderate intensity  ?statin with reduced frequency if prior  ?statin intolerance 1x weekly, #13, 3 refills  ? 2x weekly, #26, 3 refills  ? 3x weekly, #39, 3 refills  ?  ?Code for past statin intolerance  ?(required annually) ? ?Provider Requirements: ?Must associate code during an office visit or telehealth encounter  Drug Induced Myopathy G72.0  ? Myositis, unspecified M60.9  ? Myopathy, unspecified G72.9  ? Rhabdomyolysis ? M62.82  ? ? ? ? ?Please let us know your decision.   ? ?Thank you!   ?Reed Breech, PharmD ?Clinical Pharmacist  ?[place] ?[phone] ? ? ? ?  ?  ?

## 2021-12-09 ENCOUNTER — Encounter: Payer: Self-pay | Admitting: Internal Medicine

## 2021-12-09 ENCOUNTER — Ambulatory Visit (INDEPENDENT_AMBULATORY_CARE_PROVIDER_SITE_OTHER): Payer: HMO | Admitting: Internal Medicine

## 2021-12-09 VITALS — BP 118/72 | HR 67 | Resp 18 | Ht 72.0 in | Wt 210.2 lb

## 2021-12-09 DIAGNOSIS — E1151 Type 2 diabetes mellitus with diabetic peripheral angiopathy without gangrene: Secondary | ICD-10-CM | POA: Diagnosis not present

## 2021-12-09 DIAGNOSIS — T466X5A Adverse effect of antihyperlipidemic and antiarteriosclerotic drugs, initial encounter: Secondary | ICD-10-CM

## 2021-12-09 DIAGNOSIS — I2510 Atherosclerotic heart disease of native coronary artery without angina pectoris: Secondary | ICD-10-CM | POA: Diagnosis not present

## 2021-12-09 DIAGNOSIS — Z9861 Coronary angioplasty status: Secondary | ICD-10-CM

## 2021-12-09 DIAGNOSIS — I739 Peripheral vascular disease, unspecified: Secondary | ICD-10-CM

## 2021-12-09 DIAGNOSIS — B353 Tinea pedis: Secondary | ICD-10-CM

## 2021-12-09 DIAGNOSIS — I10 Essential (primary) hypertension: Secondary | ICD-10-CM | POA: Diagnosis not present

## 2021-12-09 DIAGNOSIS — B351 Tinea unguium: Secondary | ICD-10-CM | POA: Insufficient documentation

## 2021-12-09 DIAGNOSIS — E039 Hypothyroidism, unspecified: Secondary | ICD-10-CM

## 2021-12-09 DIAGNOSIS — M609 Myositis, unspecified: Secondary | ICD-10-CM

## 2021-12-09 MED ORDER — EMPAGLIFLOZIN 10 MG PO TABS: 10.0000 mg | ORAL_TABLET | Freq: Every day | ORAL | 2 refills | Status: DC

## 2021-12-09 NOTE — Assessment & Plan Note (Signed)
S/p stent placement Followed by Dr. Berry-cardiology On aspirin, Plavix and Praluent On beta-blocker, Imdur and Ranexa Denies any chest pain currently 

## 2021-12-09 NOTE — Assessment & Plan Note (Signed)
Was started on NP thyroid even though his TSH was wnl ?[facility] decreased his NP thyroid to 60 mg, which is appropriate ?Check TSH and free T4 ?

## 2021-12-09 NOTE — Assessment & Plan Note (Signed)
BP Readings from Last 1 Encounters:  ?12/09/21 118/72  ? ?Well-controlled ?Counseled for compliance with the medications ?Advised DASH diet and moderate exercise/walking ?

## 2021-12-09 NOTE — Assessment & Plan Note (Signed)
Did not tolerate at least 2 different statin ?On Praluent now ?

## 2021-12-09 NOTE — Assessment & Plan Note (Signed)
Has had stent placed in LLE Follows up with interventional cardiology On aspirin, Plavix and Praluent 

## 2021-12-09 NOTE — Assessment & Plan Note (Signed)
Lab Results  ?Component Value Date  ? HGBA1C 7.1 09/02/2021  ? ? 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

## 2021-12-09 NOTE — Patient Instructions (Signed)
Please apply Lamisil 1% cream over foot sore area. ? ?Please continue taking other medications as prescribed. ? ?Please continue to follow low carb diet and ambulate as tolerated. ?

## 2021-12-09 NOTE — Assessment & Plan Note (Signed)
Advised to use Lamisil cream for now ?

## 2021-12-09 NOTE — Progress Notes (Addendum)
? ?Established Patient Office Visit ? ?Subjective:  ?Patient ID: [name], male    DOB: Oct 18, 1945  Age: 76 y.o. MRN: [id number] ? ?CC:  ?Chief Complaint  ?Patient presents with  ? Follow-up  ?  3 month follow up HTN and DM hypothyroidism pt has sore on pinky toe on right foot has been there since 11-27-21  ? ? ?HPI ?[name] is a 76 y.o. male with past medical history of HTN, CAD, PAD, type II DM, GERD, HLD and ?Hypothyroidism who presents for f/u of his chronic medical conditions. ? ?CAD, PAD and HTN: He has had cardiac stents and stent in LLE.  He follows up with Dr. Gwenlyn Found for it.  He takes aspirin, Plavix and Praluent for it.  He takes lisinopril and metoprolol as well.  He denies any chest pain, dyspnea or palpitations currently.  He is on Imdur and Ranexa for angina. ? ?Type II DM: He is on Metaglip 5-500 mg QD currently.  His last HbA1c was 7.1 at [facility] clinic.  He used to take Metaglip BID in the past, which caused episodes of hypoglycemia.  His blood glucose ranges around 120 most of the time now.  He denies any polyuria or polydipsia currently. ? ?He was started on NP thyroid even though his TSH was normal.  [facility] his dose of NP thyroid recently.  He denies any fatigue, recent change in weight or appetite.  Denies any tremors or palpitations currently. ? ?He c/o skin breakdown underneath the right fifth toe, but denies any recent injury. Denies any bleeding or discharge. Has itching in the area. ? ? ? ? ?Past Medical History:  ?Diagnosis Date  ? CHF (congestive heart failure) ([place])   ? Coronary artery disease 08/28/2010  ? s/p multiple caths 2012, BMS PCI OM1 on August 28, 2010-during NSTEMI; January 2019 non-STEMI- occlusion of OM stent (very late stent thrombosis), initial wire crossed with PTCA, but unable to rewire, PCI aborted--> plan medical therapy, normal EF  ? Diabetes mellitus   ? GERD (gastroesophageal reflux disease)   ? Hyperlipidemia   ? Hypertension   ? Non-STEMI  (non-ST elevated myocardial infarction) [facility]) January 2012 and 2019  ? a) Jan 2012: 99% OM1 - BMS PCI; b) Jan 2019: Very late stent thrombosis/100% OM1 -after initially causing him for repeat PTCA restoring flow, unable to recross to place stent. - >  Medical therapy.  ? NSTEMI (non-ST elevated myocardial infarction) ([place])   ? Admitted 08/29/17 with NSTEMI-Troponin peak 8.8, normal LVF  ? PVD (peripheral vascular disease) ([place])   ? left SFA PTA & stenting in 02/2005 (Dr. Adora Fridge)  ? Vitamin D deficiency disease 05/04/2019  ? ? ?Past Surgical History:  ?Procedure Laterality Date  ? BIOPSY  08/02/2019  ? Procedure: BIOPSY;  Surgeon: Daneil Dolin, MD;  [facility];  Service: Endoscopy;;  gastric ?  ? CARDIAC CATHETERIZATION  12/23/2004  ? normal L main, normal LAD, normal L Cfx, RCA with 20% hypodense lesion in first end of vessel (Dr. Adora Fridge)  ? CARDIAC CATHETERIZATION  08/26/2007  ? no significant CAD by cath, EF 50% (Dr. Jackie Plum)  ? COLONOSCOPY  12/2009  ? Dr. Hampton Abbot  ? COLONOSCOPY N/A 01/30/2017  ? pancolonic diverticulosis, non-bleeding internal hemorrhoids.  ? COLONOSCOPY WITH PROPOFOL N/A 09/08/2019  ? Procedure: COLONOSCOPY WITH PROPOFOL;  Surgeon: Daneil Dolin, MD;  [facility];  Service: Endoscopy;  Laterality: N/A;  11:15am  ?  CORONARY BALLOON ANGIOPLASTY N/A 08/30/2017  ? Procedure: CORONARY BALLOON ANGIOPLASTY;  Surgeon: [name], MD;  Location: [place] CV LAB;  Service: Cardiovascular;  100% CTO very late stent thrombosis OM1 -> initially crossed with PTCA, but then unable to recross after losing my positioning.  PTCA ABORTED.  UNSUCCESSFUL ATTEMPT  ? CORONARY BALLOON ANGIOPLASTY  02/10/2011  ? 95% prox in-stent restenosis within OM stent - opened with cutting balloon (Dr. Corky Downs)  ? CORONARY STENT INTERVENTION  07/17/2011  ? in-stent restenosis - re-stented with Promus 2.25x23m DES (Dr. DRoni Bread  ? CORONARY STENT INTERVENTION  08/28/2010  ? NSTEMI: OM1  99% BMS PCI 2.0x172mMiniVision BMS (Dr. J.Adora Fridge ? ESOPHAGOGASTRODUODENOSCOPY  02/19/10  ? 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 ESOPHAGOGASTRODUODENOSCOPY N/A 08/02/2019  ? Procedure: ESOPHAGOGASTRODUODENOSCOPY (EGD);  Surgeon: RoDaneil DolinMD;  [facility];  Service: Endoscopy;  Laterality: N/A;  8:45am  ? FEMORAL ARTERY STENT  02/27/2005  ? L SFA stenting - Wholey down SFA across lesion - predilatation with 4x4 Powerflex, stenting with 7x4 Smart, post-dilatation with 6x4 powerflex (Dr. J.Adora Fridge ? LEFT HEART CATH AND CORONARY ANGIOGRAPHY N/A 08/30/2017  ? Procedure: LEFT HEART CATH AND CORONARY ANGIOGRAPHY;  Surgeon: BeLorretta HarpMD;  [place];  Service: Cardiovascular;  100% very late stent thrombosis of Overlapped BMS-DES OM1 -> attempted PTCA  ? LEFT HEART CATH AND CORONARY ANGIOGRAPHY  08/28/2010  ? NSTEMI: OM1 99% BMS PCI  (Dr. J.Adora Fridge ? LEFT HEART CATH AND CORONARY ANGIOGRAPHY  09/11/2010  ? patent stent (Dr. D.Roni Bread ? LEFT HEART CATH AND CORONARY ANGIOGRAPHY  02/10/2011  ? 95% prox in-stent restenosis within OM stent  (Dr. T.Corky Downs ? LEFT HEART CATHETERIZATION WITH CORONARY ANGIOGRAM N/A 07/17/2011  ? Procedure: LEFT HEART CATHETERIZATION WITH CORONARY ANGIOGRAM;  Surgeon: DaLeonie ManMD;  [facility];  Service: Cardiovascular;  Laterality: N/A;  Right radial approach;  90% ISR of BMS (5 months post PTCA for ISR) --> DES PCI  ? left knee arthroscopy  05/2016  ? NM MYOCAR PERF WALL MOTION  09/08/2013  ? abnormal lexiscan - low to intermediate risk;   ? POLYPECTOMY  09/08/2019  ? Procedure: POLYPECTOMY;  Surgeon: RoDaneil DolinMD;  [facility];  Service: Endoscopy;;  ? TOOTH EXTRACTION Right 06/19/2020  ? TRANSTHORACIC ECHOCARDIOGRAM  09/01/2017  ? Normal LV size and function.  EF 66 5%.  Normal wall motion.  GR 1 DD.  Mild aortic sclerosis.  Aortic  root mildly dilated at 40 mm.  ? ? ?Family History  ?Problem Relation Age of Onset  ? Arrhythmia Mother 6952? Early death Son   ? Colon cancer Neg Hx   ? Liver disease Neg Hx   ? Inflammatory bowel disease Neg Hx   ? Colon polyps Neg Hx   ? ? ?Social History  ? ?Socioeconomic History  ? Marital status: Married  ?  Spouse name: JoMechele Claude? Number of children: 3  ? Years of education: 1262? Highest education level: Some college, no degree  ?Occupational History  ? Occupation: Retired  ?  Employer: [employer]  ?Tobacco Use  ? Smoking status: Former  ?  Packs/day: 1.00  ?  Years: 30.00  ?  Pack years: 30.00  ?  Types: Cigarettes  ?  Quit date: 05/12/1999  ?  Years since quitting: 22.5  ? Smokeless tobacco: Never  ?Vaping Use  ? Vaping Use: Never used  ?Substance and Sexual Activity  ? Alcohol use: No  ? Drug use: No  ? Sexual activity: Yes  ?Other Topics Concern  ? Not on file  ?Social History Narrative  ? 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  ? ?Social Determinants of Health  ? ?Financial Resource Strain: Low Risk   ? Difficulty of Paying Living Expenses: Not hard at all  ?Food Insecurity: No Food Insecurity  ? Worried About Charity fundraiser in the Last Year: Never true  ? Ran Out of Food in the Last Year: Never true  ?Transportation Needs: No Transportation Needs  ? Lack of Transportation (Medical): No  ? Lack of Transportation (Non-Medical): No  ?Physical Activity: Sufficiently Active  ? Days of Exercise per Week: 7 days  ? Minutes of Exercise per Session: 60 min  ?Stress: No Stress Concern Present  ? Feeling of Stress : Not at all  ?Social Connections: Socially Integrated  ? Frequency of Communication with Friends and Family: More than three times a week  ? Frequency of Social Gatherings with Friends and Family: Three times a week  ? Attends Religious Services: More than 4 times per year  ? Active Member of Clubs or Organizations: Yes  ? Attends English as a second language teacher Meetings: More than 4 times per year  ? Marital Status: Married  ?Intimate Partner Violence: Not At Risk  ? Fear of Current or Ex-Partner: No  ? Emotionally Abused: No  ? Physically Abused:

## 2021-12-10 LAB — BASIC METABOLIC PANEL
BUN/Creatinine Ratio: 11 (ref 10–24)
BUN: 15 mg/dL (ref 8–27)
CO2: 23 mmol/L (ref 20–29)
Calcium: 10.2 mg/dL (ref 8.6–10.2)
Chloride: 105 mmol/L (ref 96–106)
Creatinine, Ser: 1.32 mg/dL — ABNORMAL HIGH (ref 0.76–1.27)
Glucose: 129 mg/dL — ABNORMAL HIGH (ref 70–99)
Potassium: 3.9 mmol/L (ref 3.5–5.2)
Sodium: 139 mmol/L (ref 134–144)
eGFR: 57 mL/min/{1.73_m2} — ABNORMAL LOW (ref >59)

## 2021-12-10 LAB — HEMOGLOBIN A1C
Est. average glucose Bld gHb Est-mCnc: 148 mg/dL
Hgb A1c MFr Bld: 6.8 % — ABNORMAL HIGH (ref 4.8–5.6)

## 2021-12-10 LAB — TSH+FREE T4
Free T4: 0.96 ng/dL (ref 0.82–1.77)
TSH: 0.182 u[IU]/mL — ABNORMAL LOW (ref 0.450–4.500)

## 2021-12-10 MED ORDER — THYROID 30 MG PO TABS: 30.0000 mg | ORAL_TABLET | Freq: Every day | ORAL | 3 refills | Status: DC

## 2021-12-10 NOTE — Addendum Note (Signed)
Addended byIhor Dow on: 12/10/2021 08:15 AM ? ? Modules accepted: Orders ? ?

## 2021-12-11 LAB — MICROALBUMIN / CREATININE URINE RATIO
Creatinine, Urine: 82 mg/dL
Microalb/Creat Ratio: <4 mg/g creat (ref 0–29)
Microalbumin, Urine: <3 ug/mL

## 2021-12-24 ENCOUNTER — Other Ambulatory Visit: Payer: Self-pay | Admitting: Internal Medicine

## 2021-12-24 DIAGNOSIS — E1151 Type 2 diabetes mellitus with diabetic peripheral angiopathy without gangrene: Secondary | ICD-10-CM

## 2022-02-18 ENCOUNTER — Telehealth: Payer: Self-pay | Admitting: Internal Medicine

## 2022-02-18 NOTE — Telephone Encounter (Signed)
Scheduled a virtual visit with [name] to discuss 02-19-22

## 2022-02-18 NOTE — Telephone Encounter (Signed)
Patient called in regard to   empagliflozin (JARDIANCE) 10 MG TABS tablet   Patient states he had an allergic reaction to meds about 2 weeks ago , patient has stopped taking med all together.  Wanted to let provider know.

## 2022-02-19 ENCOUNTER — Ambulatory Visit (INDEPENDENT_AMBULATORY_CARE_PROVIDER_SITE_OTHER): Payer: HMO | Admitting: Internal Medicine

## 2022-02-19 ENCOUNTER — Encounter: Payer: Self-pay | Admitting: Internal Medicine

## 2022-02-19 DIAGNOSIS — E1151 Type 2 diabetes mellitus with diabetic peripheral angiopathy without gangrene: Secondary | ICD-10-CM | POA: Diagnosis not present

## 2022-02-19 DIAGNOSIS — R21 Rash and other nonspecific skin eruption: Secondary | ICD-10-CM

## 2022-02-19 NOTE — Patient Instructions (Signed)
Please stop taking Jardiance. Please continue taking other medications as prescribed.  Please keep genital area clean and dry. Maintain adequate hydration.

## 2022-02-19 NOTE — Assessment & Plan Note (Signed)
Lab Results  Component Value Date   HGBA1C 6.8 (H) 12/09/2021    On metformin 5-500 mg QD currently Had given Jardiance for additional cardiac benefit, but had rash and penile swelling, DC Jardiance for now - may give trial of Farxiga later Advised to follow diabetic diet On statin and ACEi F/u BMP and HbA1C Diabetic eye exam: Advised to follow up with Ophthalmology for diabetic eye exam

## 2022-02-19 NOTE — Progress Notes (Signed)
Virtual Visit via Telephone Note   This visit type was conducted due to national recommendations for restrictions regarding the COVID-19 Pandemic (e.g. social distancing) in an effort to limit this patient's exposure and mitigate transmission in our community.  Due to his co-morbid illnesses, this patient is at least at moderate risk for complications without adequate follow up.  This format is felt to be most appropriate for this patient at this time.  The patient did not have access to video technology/had technical difficulties with video requiring transitioning to audio format only (telephone).  All issues noted in this document were discussed and addressed.  No physical exam could be performed with this format.  Evaluation Performed:  Follow-up visit  Date:  02/19/2022   ID:  [name], DOB [birth year], MRN [id number]  Patient Location: Home Provider Location: Office/Clinic  Participants: Patient Location of Patient: Home Location of Provider: Telehealth Consent was obtain for visit to be over via telehealth. I verified that I am speaking with the correct person using two identifiers.  PCP:  Lindell Spar, MD   Chief Complaint: Rash and penile swelling   History of Present Illness:    [name] is a 76 y.o. male who has a televisit for c/o penile rash and swelling since taking Jardiance.  He had whitish patch over his penile area, which was itching.  He has stopped taking Jardiance for the last 4 weeks.  He reports that his rash and penile swelling have resolved now.  He had similar reaction with Flomax in the past.  He is currently taking Metaglip for type II DM.  He denies any dysuria, hematuria, urinary resistance or hesitance, fever or chills.  Denies any urethral discharge currently.  The patient does not have symptoms concerning for COVID-19 infection (fever, chills, cough, or new shortness of breath).   Past Medical, Surgical, Social History, Allergies, and  Medications have been Reviewed.  Past Medical History:  Diagnosis Date   CHF (congestive heart failure) ([place])    Coronary artery disease 08/28/2010   s/p multiple caths 2012, BMS PCI OM1 on August 28, 2010-during NSTEMI; January 2019 non-STEMI- occlusion of OM stent (very late stent thrombosis), initial wire crossed with PTCA, but unable to rewire, PCI aborted--> plan medical therapy, normal EF   Diabetes mellitus    GERD (gastroesophageal reflux disease)    Hyperlipidemia    Hypertension    Non-STEMI (non-ST elevated myocardial infarction) [facility]) January 2012 and 2019   a) Jan 2012: 99% OM1 - BMS PCI; b) Jan 2019: Very late stent thrombosis/100% OM1 -after initially causing him for repeat PTCA restoring flow, unable to recross to place stent. - >  Medical therapy.   NSTEMI (non-ST elevated myocardial infarction) ([place])    Admitted 08/29/17 with NSTEMI-Troponin peak 8.8, normal LVF   PVD (peripheral vascular disease) ([place])    left SFA PTA & stenting in 02/2005 (Dr. Adora Fridge)   Vitamin D deficiency disease 05/04/2019   Past Surgical History:  Procedure Laterality Date   BIOPSY  08/02/2019   Procedure: BIOPSY;  Surgeon: Daneil Dolin, MD;  [facility];  Service: Endoscopy;;  gastric    CARDIAC CATHETERIZATION  12/23/2004   normal L main, normal LAD, normal L Cfx, RCA with 20% hypodense lesion in first end of vessel (Dr. Adora Fridge)   [place]  08/26/2007   no significant CAD by cath, EF 50% (Dr. Jackie Plum)   COLONOSCOPY  12/2009  Dr. Hampton Abbot   COLONOSCOPY N/A 01/30/2017   pancolonic diverticulosis, non-bleeding internal hemorrhoids.   COLONOSCOPY WITH PROPOFOL N/A 09/08/2019   Procedure: COLONOSCOPY WITH PROPOFOL;  Surgeon: Daneil Dolin, MD;  [facility];  Service: Endoscopy;  Laterality: N/A;  11:15am   CORONARY BALLOON ANGIOPLASTY N/A 08/30/2017   Procedure: CORONARY BALLOON ANGIOPLASTY;  Surgeon: [name], MD;  Location: [place] [facility] LAB;  Service: Cardiovascular;  100% CTO very late stent thrombosis OM1 -> initially crossed with PTCA, but then unable to recross after losing my positioning.  PTCA ABORTED.  UNSUCCESSFUL ATTEMPT   CORONARY BALLOON ANGIOPLASTY  02/10/2011   95% prox in-stent restenosis within OM stent - opened with cutting balloon (Dr. Corky Downs)   CORONARY STENT INTERVENTION  07/17/2011   in-stent restenosis - re-stented with Promus 2.25x21m DES (Dr. DRoni Bread   [place] 08/28/[place]   NSTEMI: OM1 99% BMS PCI 2.0x176mMiniVision BMS (Dr. 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 J.Adora Fridge  LEFT HEART CATH AND CORONARY ANGIOGRAPHY N/A 08/30/2017   Procedure: LEFT HEART CATH AND CORONARY ANGIOGRAPHY;  Surgeon: BeLorretta HarpMD;  [facility];  Service: Cardiovascular;  100% very late stent thrombosis of Overlapped BMS-DES OM1 -> attempted PTCA   LEFT HEART CATH AND CORONARY ANGIOGRAPHY  08/28/2010   NSTEMI: OM1 99% BMS PCI  (Dr. J.Adora Fridge  LEFT HEART CATH AND CORONARY ANGIOGRAPHY  09/11/2010   patent stent (Dr. D.Roni Bread  [place] AND CORONARY ANGIOGRAPHY  02/10/2011   95% prox in-stent restenosis within OM stent  (Dr. T.Corky Downs  LEFT HEART CATHETERIZATION WITH CORONARY ANGIOGRAM N/A 07/17/2011   Procedure: LEFT HEART CATHETERIZATION WITH CORONARY ANGIOGRAM;  Surgeon: DaLeonie ManMD;  [facility];  Service: Cardiovascular;  Laterality: N/A;   Right radial approach;  90% ISR of BMS (5 months post PTCA for ISR) --> DES PCI   left knee arthroscopy  05/2016   NM MYOCAR PERF WALL MOTION  09/08/2013   abnormal lexiscan - low to intermediate risk;    POLYPECTOMY  09/08/2019   Procedure: POLYPECTOMY;  Surgeon: RoDaneil DolinMD;  [facility];  Service: Endoscopy;;   TOOTH EXTRACTION Right 06/19/2020   TRANSTHORACIC ECHOCARDIOGRAM  09/01/2017   Normal LV size and function.  EF 66 5%.  Normal wall motion.  GR 1 DD.  Mild aortic sclerosis.  Aortic root mildly dilated at 40 mm.     Current Meds  Medication Sig   acetaminophen (TYLENOL) 500 MG tablet Take 1,000 mg by mouth as needed for moderate pain.    Alirocumab (PRALUENT) 75 MG/ML SOAJ Inject 1 Dose into the skin every 14 (fourteen) days.   aspirin 81 MG chewable tablet Chew 1 tablet (81 mg total) by mouth daily.   bisacodyl (DULCOLAX) 5 MG EC tablet Take 5 mg by mouth every other day.   Cholecalciferol (VITAMIN D3) 50 MCG (2000 UT) TABS Take 3 tablets by mouth daily.   clopidogrel (  PLAVIX) 75 MG tablet TAKE ONE TABLET BY MOUTH ONCE DAILY.   cyclobenzaprine (FLEXERIL) 10 MG tablet Take 1 tablet by mouth as needed.   dextromethorphan-guaiFENesin (MUCINEX DM) 30-600 MG 12hr tablet Take 1 tablet by mouth as needed.    diclofenac Sodium (VOLTAREN) 1 % GEL Apply topically as needed.   docusate sodium (COLACE) 100 MG capsule Take 200 mg by mouth every other day.   fexofenadine (ALLEGRA) 180 MG tablet Take 180 mg by mouth as needed for allergies or rhinitis. Rotates with Claritin.   glipiZIDE-metformin (METAGLIP) 5-500 MG tablet TAKE (1) TABLET BY MOUTH ONCE DAILY AT 12 NOON.   isosorbide mononitrate (IMDUR) 60 MG 24 hr tablet Take 1 tablet (60 mg total) by mouth daily.   lisinopril (PRINIVIL,ZESTRIL) 5 MG tablet Take 5 mg by mouth daily.   metoprolol succinate (TOPROL-XL) 25 MG 24 hr tablet TAKE 2 TABLETS BY MOUTH ONCE DAILY.   nitroGLYCERIN (NITROSTAT) 0.4 MG SL tablet DISSOLVE  1 TABLET UNDER TONGUE EVERY 5 MINUTES UP TO 15 MIN FOR CHEST PAIN. IF NO RELIEF CALL 911.   omeprazole (PRILOSEC) 20 MG capsule Take 20 mg by mouth daily.   ONETOUCH ULTRA test strip USE AS DIRECTED UP TO 3 TIMES DAILY IF NEEDED.   ranolazine (RANEXA) 500 MG 12 hr tablet Take 500 mg by mouth 2 (two) times daily.   sucralfate (CARAFATE) 1 g tablet Take 1 tablet (1 g total) by mouth 4 (four) times daily as needed.   thyroid (ARMOUR) 30 MG tablet Take 1 tablet (30 mg total) by mouth daily before breakfast.     Allergies:   Crestor [rosuvastatin], Propoxyphene n-acetaminophen, Statins, Tape, Zetia [ezetimibe], Zocor [simvastatin], Flomax [tamsulosin hcl], Flomax [tamsulosin], Levaquin [levofloxacin hemihydrate], Levofloxacin, Penicillin g, and Penicillins   ROS:   Please see the history of present illness.     All other systems reviewed and are negative.   Labs/Other Tests and Data Reviewed:    Recent Labs: 05/22/2021: ALT 15; Hemoglobin 13.5; Platelets 190 12/09/2021: BUN 15; Creatinine, Ser 1.32; Potassium 3.9; Sodium 139; TSH 0.182   Recent Lipid Panel Lab Results  Component Value Date/Time   CHOL 135 02/04/2021 12:00 AM   CHOL 131 07/04/2020 09:38 AM   TRIG 128 02/04/2021 12:00 AM   HDL 48 02/04/2021 12:00 AM   HDL 41 07/04/2020 09:38 AM   CHOLHDL 3.2 07/04/2020 09:38 AM   CHOLHDL 3.4 12/08/2019 10:41 AM   LDLCALC 61 02/04/2021 12:00 AM   LDLCALC 65 07/04/2020 09:38 AM   LDLCALC 69 12/08/2019 10:41 AM   LDLDIRECT 66 04/13/2013 07:32 AM    Wt Readings from Last 3 Encounters:  12/09/21 210 lb 3.2 oz (95.3 kg)  10/30/21 210 lb (95.3 kg)  09/11/21 216 lb 0.6 oz (98 kg)     ASSESSMENT & PLAN:    Penile rash Could be due to allergic reaction versus fungal infection Now better since stopping Jardiance He had tried Neosporin  DM (diabetes mellitus), type 2 with peripheral vascular complications (HCC) Lab Results  Component Value Date   HGBA1C 6.8 (H) 12/09/2021    On  metformin 5-500 mg QD currently Had given Jardiance for additional cardiac benefit, but had rash and penile swelling, DC Jardiance for now - may give trial of Farxiga later Advised to follow diabetic diet On statin and ACEi F/u BMP and HbA1C Diabetic eye exam: Advised to follow up with Ophthalmology for diabetic eye exam   Time:   Today, I have spent 12 minutes reviewing  the chart, including problem list, medications, and with the patient with telehealth technology discussing the above problems.   Medication Adjustments/Labs and Tests Ordered: Current medicines are reviewed at length with the patient today.  Concerns regarding medicines are outlined above.   Tests Ordered: No orders of the defined types were placed in this encounter.   Medication Changes: No orders of the defined types were placed in this encounter.    Note: This dictation was prepared with Dragon dictation along with smaller phrase technology. Similar sounding words can be transcribed inadequately or may not be corrected upon review. Any transcriptional errors that result from this process are unintentional.      Disposition:  Follow up  Signed, Lindell Spar, MD  02/19/2022 4:12 PM     [facility]

## 2022-03-04 ENCOUNTER — Encounter (HOSPITAL_COMMUNITY): Payer: Self-pay

## 2022-03-04 ENCOUNTER — Other Ambulatory Visit: Payer: Self-pay

## 2022-03-04 ENCOUNTER — Emergency Department (HOSPITAL_COMMUNITY)
Admission: EM | Admit: 2022-03-04 | Discharge: 2022-03-05 | Disposition: A | Discharge status: Home / Self Care | Payer: No Typology Code available for payment source | Attending: Emergency Medicine | Admitting: Emergency Medicine

## 2022-03-04 DIAGNOSIS — E119 Type 2 diabetes mellitus without complications: Secondary | ICD-10-CM | POA: Insufficient documentation

## 2022-03-04 DIAGNOSIS — L02414 Cutaneous abscess of left upper limb: Secondary | ICD-10-CM | POA: Diagnosis present

## 2022-03-04 DIAGNOSIS — I10 Essential (primary) hypertension: Secondary | ICD-10-CM

## 2022-03-04 DIAGNOSIS — Z7902 Long term (current) use of antithrombotics/antiplatelets: Secondary | ICD-10-CM | POA: Diagnosis not present

## 2022-03-04 DIAGNOSIS — I509 Heart failure, unspecified: Secondary | ICD-10-CM | POA: Insufficient documentation

## 2022-03-04 DIAGNOSIS — Z7982 Long term (current) use of aspirin: Secondary | ICD-10-CM | POA: Diagnosis not present

## 2022-03-04 DIAGNOSIS — Z79899 Other long term (current) drug therapy: Secondary | ICD-10-CM | POA: Insufficient documentation

## 2022-03-04 DIAGNOSIS — I110 Hypertensive heart disease with heart failure: Secondary | ICD-10-CM | POA: Diagnosis not present

## 2022-03-04 DIAGNOSIS — Z7984 Long term (current) use of oral hypoglycemic drugs: Secondary | ICD-10-CM | POA: Diagnosis not present

## 2022-03-04 DIAGNOSIS — I2510 Atherosclerotic heart disease of native coronary artery without angina pectoris: Secondary | ICD-10-CM | POA: Insufficient documentation

## 2022-03-04 NOTE — ED Triage Notes (Addendum)
Pt arrived with complaints of a cyst on left shoulder that appeared last week that is red and tender. Says he was seen at [facility] today and surgery scheduled for 03/17/22. States pain is now 8/10 and is radiating to neck.[name]

## 2022-03-05 MED ORDER — LIDOCAINE-EPINEPHRINE (PF) 1 %-1:200000 IJ SOLN
20.0000 mL | Freq: Once | INTRAMUSCULAR | Status: AC
  Administered 2022-03-05: 20 mL
  Filled 2022-03-05: qty 30

## 2022-03-05 NOTE — ED Provider Notes (Signed)
[facility] EMERGENCY DEPARTMENT Provider Note   CSN: [id number] Arrival date & time: 03/04/22  2308     History  Chief Complaint  Patient presents with   Cyst    [name] is a 76 y.o. male.  The history is provided by the patient.  He has history of hypertension, diabetes, hyperlipidemia, coronary artery disease, peripheral vascular disease, heart failure and comes in because of cyst on his left shoulder.  He noticed it about 1 week ago and saw his physician at the [facility] who had scheduled him for surgery in about 2 weeks.  However, the cyst is getting larger and more painful.  He denies fever or chills.  He did have surgery on a cyst in the same location several years ago.   Home Medications Prior to Admission medications   Medication Sig Start Date End Date Taking? Authorizing Provider  acetaminophen (TYLENOL) 500 MG tablet Take 1,000 mg by mouth as needed for moderate pain.     [provider]  Alirocumab (PRALUENT) 75 MG/ML SOAJ Inject 1 Dose into the skin every 14 (fourteen) days.    [provider]  aspirin 81 MG chewable tablet Chew 1 tablet (81 mg total) by mouth daily. 09/03/17   Cheryln Manly, NP  bisacodyl (DULCOLAX) 5 MG EC tablet Take 5 mg by mouth every other day.    [provider]  Cholecalciferol (VITAMIN D3) 50 MCG (2000 UT) TABS Take 3 tablets by mouth daily.    [provider]  clopidogrel (PLAVIX) 75 MG tablet TAKE ONE TABLET BY MOUTH ONCE DAILY. 12/07/15   [name], MD  cyclobenzaprine (FLEXERIL) 10 MG tablet Take 1 tablet by mouth as needed. 09/07/20   [provider]  dextromethorphan-guaiFENesin (MUCINEX DM) 30-600 MG 12hr tablet Take 1 tablet by mouth as needed.     [provider]  diclofenac Sodium (VOLTAREN) 1 % GEL Apply topically as needed. 09/07/20   [provider]  docusate sodium (COLACE) 100 MG capsule Take 200 mg by mouth every other day.    [provider]  fexofenadine (ALLEGRA) 180 MG tablet Take 180 mg by mouth as needed for allergies or rhinitis. Rotates with Claritin.    [provider]  glipiZIDE-metformin (METAGLIP) 5-500 MG tablet TAKE (1) TABLET BY MOUTH ONCE DAILY AT 12 NOON. 12/24/21   Lindell Spar, MD  isosorbide mononitrate (IMDUR) 60 MG 24 hr tablet Take 1 tablet (60 mg total) by mouth daily. 06/21/19   [name], MD  lisinopril (PRINIVIL,ZESTRIL) 5 MG tablet Take 5 mg by mouth daily.    [provider]  metoprolol succinate (TOPROL-XL) 25 MG 24 hr tablet TAKE 2 TABLETS BY MOUTH ONCE DAILY. 11/26/21   [name], MD  nitroGLYCERIN (NITROSTAT) 0.4 MG SL tablet DISSOLVE 1 TABLET UNDER TONGUE EVERY 5 MINUTES UP TO 15 MIN FOR CHEST PAIN. IF NO RELIEF CALL 911. 10/02/21   [name], MD  omeprazole (PRILOSEC) 20 MG capsule Take 20 mg by mouth daily.    [provider]  ONETOUCH ULTRA test strip USE AS DIRECTED UP TO 3 TIMES DAILY IF NEEDED. 03/28/20   Hurshel Party C, MD  ranolazine (RANEXA) 500 MG 12 hr tablet Take 500 mg by mouth 2 (two) times daily.    [provider]  sucralfate (CARAFATE) 1 g tablet Take 1 tablet (1 g total) by mouth 4 (four) times daily as needed. 07/12/20   Walden Field  A, NP  thyroid (ARMOUR) 30 MG tablet Take 1 tablet (30 mg total) by mouth daily before breakfast. 12/10/21   Lindell Spar, MD      Allergies    Crestor [rosuvastatin], Jardiance [empagliflozin], Propoxyphene n-acetaminophen, Statins, Tape, Zetia [ezetimibe], Zocor [simvastatin], Flomax [tamsulosin hcl], Flomax [tamsulosin], Levaquin [levofloxacin hemihydrate], Levofloxacin, Penicillin g, and Penicillins    Review of Systems   Review of Systems  All other systems reviewed and are negative.   Physical Exam Updated Vital Signs BP (!) 147/71   Pulse 71   Temp 97.7 F (36.5 C)   Resp 17   Ht 6' (1.829 m)   Wt 96.2 kg   SpO2 97%   BMI 28.75 kg/m  Physical Exam Vitals and  nursing note reviewed.   76 year old male, resting comfortably and in no acute distress. Vital signs are significant for elevated blood pressure. Oxygen saturation is 97%, which is normal. Head is normocephalic and atraumatic. PERRLA, EOMI. Oropharynx is clear. Neck is nontender and supple without adenopathy or JVD. Back: There is a 4 cm raised lesion on the left side of the upper back with a 1 cm area of fluctuance.  Appearance is that of a sebaceous cyst.  No overlying erythema or warmth.  Remainder of back exam is benign. Lungs are clear without rales, wheezes, or rhonchi. Chest is nontender. Heart has regular rate and rhythm without murmur. Abdomen is soft, flat, nontender. Extremities have no cyanosis or edema, full range of motion is present. Skin is warm and dry without rash. Neurologic: Mental status is normal, cranial nerves are intact, moves all extremities equally.  ED Results / Procedures / Treatments    Procedures .Marland KitchenIncision and Drainage  Date/Time: 03/05/2022 12:38 AM  Performed by: Delora Fuel, MD Authorized by: Delora Fuel, MD   Consent:    Consent obtained:  Verbal   Consent given by:  Patient   Risks, benefits, and alternatives were discussed: yes     Risks discussed:  Bleeding, pain and incomplete drainage   Alternatives discussed:  No treatment Universal protocol:    Procedure explained and questions answered to patient or proxy's satisfaction: yes     Relevant documents present and verified: yes     Required blood products, implants, devices, and special equipment available: yes     Site/side marked: yes     Immediately prior to procedure, a time out was called: yes     Patient identity confirmed:  Verbally with patient and arm band Location:    Size:  4 cm   Location:  Trunk   Trunk location:  Back Pre-procedure details:    Skin preparation:  Antiseptic wash Sedation:    Sedation type:  None Anesthesia:    Anesthesia method:  Local infiltration    Local anesthetic:  Lidocaine 1% WITH epi Procedure type:    Complexity:  Complex Procedure details:    Ultrasound guidance: no     Needle aspiration: no     Incision types:  Cruciate   Incision depth:  Subcutaneous   Wound management:  Probed and deloculated   Drainage:  Purulent (With a moderate amount of sebaceous material)   Drainage amount:  Copious   Wound treatment:  Wound left open   Packing materials:  None Post-procedure details:    Procedure completion:  Tolerated well, no immediate complications     Medications Ordered in ED Medications  lidocaine-EPINEPHrine (PF) (XYLOCAINE-EPINEPHrine) 1 %-1:200000 (PF) injection 20 mL (has no administration in time  range)    ED Course/ Medical Decision Making/ A&P                           Medical Decision Making Risk Prescription drug management.   Sebaceous cyst in the left upper back treated with incision and drainage.  He is referred back to his providers at the [facility].  Since this is a recurrence of a prior cyst, he likely would benefit from definitive removal of the cyst.  Final Clinical Impression(s) / ED Diagnoses Final diagnoses:  Abscess of left shoulder  Elevated blood pressure reading with diagnosis of hypertension    Rx / DC Orders ED Discharge Orders     None         Delora Fuel, MD 79/72/82 0040

## 2022-03-05 NOTE — Discharge Instructions (Addendum)
Please follow-up with your providers at the [facility].  The drainage procedure that was done today is a temporary fix.  You may need to have more extensive procedure to actually remove the entire cyst.  Return at any time if you are having problems.

## 2022-03-25 ENCOUNTER — Encounter: Payer: Self-pay | Admitting: *Deleted

## 2022-03-25 ENCOUNTER — Other Ambulatory Visit: Payer: Self-pay | Admitting: Internal Medicine

## 2022-03-25 DIAGNOSIS — E1151 Type 2 diabetes mellitus with diabetic peripheral angiopathy without gangrene: Secondary | ICD-10-CM

## 2022-04-17 ENCOUNTER — Ambulatory Visit (INDEPENDENT_AMBULATORY_CARE_PROVIDER_SITE_OTHER): Payer: HMO | Admitting: Internal Medicine

## 2022-04-17 ENCOUNTER — Encounter: Payer: Self-pay | Admitting: Internal Medicine

## 2022-04-17 VITALS — BP 120/62 | HR 63 | Resp 18 | Ht 72.0 in | Wt 216.4 lb

## 2022-04-17 DIAGNOSIS — Z23 Encounter for immunization: Secondary | ICD-10-CM | POA: Diagnosis not present

## 2022-04-17 DIAGNOSIS — I10 Essential (primary) hypertension: Secondary | ICD-10-CM

## 2022-04-17 DIAGNOSIS — E785 Hyperlipidemia, unspecified: Secondary | ICD-10-CM

## 2022-04-17 DIAGNOSIS — E039 Hypothyroidism, unspecified: Secondary | ICD-10-CM | POA: Diagnosis not present

## 2022-04-17 DIAGNOSIS — E1151 Type 2 diabetes mellitus with diabetic peripheral angiopathy without gangrene: Secondary | ICD-10-CM | POA: Diagnosis not present

## 2022-04-17 NOTE — Assessment & Plan Note (Signed)
BP Readings from Last 1 Encounters:  04/17/22 120/62   Well-controlled Counseled for compliance with the medications Advised DASH diet and moderate exercise/walking

## 2022-04-17 NOTE — Assessment & Plan Note (Signed)
Was started on NP thyroid even though his TSH was wnl [facility] his NP thyroid to 60 mg Check TSH and free T4 - decreased dose of NP thyroid to 30 mg QD Last TSH wnl at [facility] clinic

## 2022-04-17 NOTE — Progress Notes (Signed)
Established Patient Office Visit  Subjective:  Patient ID: [name], male    DOB: September 17, 1945  Age: 76 y.o. MRN: [id number]  CC:  Chief Complaint  Patient presents with   Follow-up    Follow up DM and HTN     HPI [name] is a 76 y.o. male with past medical history of HTN, CAD, PAD, type II DM, GERD, HLD and ?Hypothyroidism who presents for f/u of his chronic medical conditions.  CAD, PAD and HTN: He has had cardiac stents and stent in LLE.  He follows up with Dr. Gwenlyn Found for it.  He takes aspirin, Plavix and Praluent for it.  He takes lisinopril and metoprolol as well.  He denies any chest pain, dyspnea or palpitations currently.  He is on Imdur and Ranexa for angina.  Type II DM: He is on Metaglip 5-500 mg QD currently.  His last HbA1c was 7.1 at [facility] clinic.  He used to take Metaglip BID in the past, which caused episodes of hypoglycemia.  His blood glucose ranges around 120 most of the time now.  He denies any polyuria or polydipsia currently.  He c/o penile rash and swelling since taking Jardiance.  He had whitish patch over his penile area, which was itching.  He has stopped taking Jardiance for the last 4 weeks.  He reports that his rash and penile swelling have resolved now.  He had similar reaction with Flomax in the past.   He was started on NP thyroid even though his TSH was normal by his previous PCP.  I decreased his dose of NP thyroid recently.  He denies any fatigue, recent change in weight or appetite.  Denies any tremors or palpitations currently. His last TSH was wnl at [facility] clinic.  Blood test from [facility] clinic were reviewed and discussed with the patient in detail.  Past Medical History:  Diagnosis Date   CHF (congestive heart failure) ([place])    Coronary artery disease 08/28/2010   s/p multiple caths 2012, BMS PCI OM1 on August 28, 2010-during NSTEMI; January 2019 non-STEMI- occlusion of OM stent (very late stent thrombosis), initial wire crossed with PTCA, but  unable to rewire, PCI aborted--> plan medical therapy, normal EF   Diabetes mellitus    GERD (gastroesophageal reflux disease)    Hyperlipidemia    Hypertension    Non-STEMI (non-ST elevated myocardial infarction) [facility]) January 2012 and 2019   a) Jan 2012: 99% OM1 - BMS PCI; b) Jan 2019: Very late stent thrombosis/100% OM1 -after initially causing him for repeat PTCA restoring flow, unable to recross to place stent. - >  Medical therapy.   NSTEMI (non-ST elevated myocardial infarction) ([place])    Admitted 08/29/17 with NSTEMI-Troponin peak 8.8, normal LVF   PVD (peripheral vascular disease) (HCC)    left SFA PTA & stenting in 02/2005 (Dr. Adora Fridge)   Vitamin D deficiency disease 05/04/2019    Past Surgical History:  Procedure Laterality Date   BIOPSY  08/02/2019   Procedure: BIOPSY;  Surgeon: Daneil Dolin, MD;  [facility];  Service: Endoscopy;;  gastric    CARDIAC CATHETERIZATION  12/23/2004   normal L main, normal LAD, normal L Cfx, RCA with 20% hypodense lesion in first end of vessel (Dr. Adora Fridge)   [place]  08/26/2007   no significant CAD by cath, EF 50% (Dr. Jackie Plum)   COLONOSCOPY  12/2009   Dr. Hampton Abbot   COLONOSCOPY N/A 01/30/2017   pancolonic diverticulosis,  non-bleeding internal hemorrhoids.   COLONOSCOPY WITH PROPOFOL N/A 09/08/2019   Procedure: COLONOSCOPY WITH PROPOFOL;  Surgeon: Daneil Dolin, MD;  [facility];  Service: Endoscopy;  Laterality: N/A;  11:15am   CORONARY BALLOON ANGIOPLASTY N/A 08/30/2017   Procedure: CORONARY BALLOON ANGIOPLASTY;  Surgeon: [name], MD;  Location: [facility];  Service: Cardiovascular;  100% CTO very late stent thrombosis OM1 -> initially crossed with PTCA, but then unable to recross after losing my positioning.  PTCA ABORTED.  UNSUCCESSFUL ATTEMPT   CORONARY BALLOON ANGIOPLASTY  02/10/2011   95% prox in-stent restenosis within OM stent - opened with cutting balloon (Dr. Corky Downs)    CORONARY STENT INTERVENTION  07/17/2011   in-stent restenosis - re-stented with Promus 2.25x84m DES (Dr. DRoni Bread   CPoint Clear 08/28/2010   NSTEMI: OM1 99% BMS PCI 2.0x124mMiniVision BMS (Dr. 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 J.Adora Fridge  LEFT HEART CATH AND CORONARY ANGIOGRAPHY N/A 08/30/2017   Procedure: LEFT HEART CATH AND CORONARY ANGIOGRAPHY;  Surgeon: BeLorretta HarpMD;  [facility];  Service: Cardiovascular;  100% very late stent thrombosis of Overlapped BMS-DES OM1 -> attempted PTCA   LEFT HEART CATH AND CORONARY ANGIOGRAPHY  08/28/2010   NSTEMI: OM1 99% BMS PCI  (Dr. J.Adora Fridge  LEFT HEART CATH AND CORONARY ANGIOGRAPHY  09/11/2010   patent stent (Dr. D.Roni Bread  [facility]ON[facility]  02/10/2011   95% prox in-stent restenosis within OM stent  (Dr. T.Corky Downs  LEFT HEART CATHETERIZATION WITH CORONARY ANGIOGRAM N/A 07/17/2011   Procedure: LEFT HEART CATHETERIZATION WITH CORONARY ANGIOGRAM;  Surgeon: DaLeonie ManMD;  [facility];  Service: Cardiovascular;  Laterality: N/A;  Right radial approach;  90% ISR of BMS (5 months post PTCA for ISR) --> DES PCI   left knee arthroscopy  05/2016   NM MYOCAR PERF WALL MOTION  09/08/2013   abnormal lexiscan - low to intermediate risk;    POLYPECTOMY  09/08/2019   Procedure: POLYPECTOMY;  Surgeon: RoDaneil DolinMD;  [facility];  Service: Endoscopy;;   TOOTH  EXTRACTION Right 06/19/2020   TRANSTHORACIC ECHOCARDIOGRAM  09/01/2017   Normal LV size and function.  EF 66 5%.  Normal wall motion.  GR 1 DD.  Mild aortic sclerosis.  Aortic root mildly dilated at 40 mm.    Family History  Problem Relation Age of Onset   Arrhythmia Mother 6948 Early death Son    Colon cancer Neg Hx    Liver disease Neg Hx    Inflammatory bowel disease Neg Hx    Colon polyps Neg Hx     Social History   Socioeconomic History   Marital status: Married    Spouse name: JoMechele Claude Number of children: 3   Years of education: 12   Highest education level: Some college, no degree  Occupational History   Occupation: Retired    EmFish farm managerLOPublishing copy  Tobacco Use   Smoking status: Former    Packs/day: 1.00    Years: 30.00    Total pack years: 30.00    Types: Cigarettes    Quit date: 05/12/1999    Years since quitting: 22.9   Smokeless tobacco: Never  Vaping Use   Vaping Use: Never used  Substance and Sexual Activity   Alcohol use: No   Drug use: No   Sexual activity: Yes  Other Topics Concern   Not on file  Social History Narrative   Married for 49 years.Lives with wife.Retired,ex-lab Merchant navy officer.Ex-Marine,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   Social Determinants of Health   Financial Resource Strain: Low Risk  (09/12/2021)   Overall Financial Resource Strain (CARDIA)    Difficulty of Paying Living Expenses: Not hard at all  Food Insecurity: No Food Insecurity (09/12/2021)   Hunger Vital Sign    Worried About Running Out of Food in the Last Year: Never true    Ran Out of Food in the Last Year: Never true  Transportation Needs: No Transportation Needs (09/12/2021)   PRAPARE - Hydrologist (Medical): No    Lack of Transportation (Non-Medical): No  Physical Activity: Sufficiently Active (09/12/2021)   Exercise Vital Sign    Days of Exercise per Week: 7 days    Minutes of Exercise per Session: 60 min  Stress: No Stress Concern Present  (09/12/2021)   [place]    Feeling of Stress : Not at all  Social Connections: [place] (09/12/2021)   Social Connection and Isolation Panel [NHANES]    Frequency of Communication with Friends and Family: More than three times a week    Frequency of Social Gatherings with Friends and Family: Three times a week    Attends Religious Services: More than 4 times per year    Active Member of Clubs or Organizations: Yes    Attends Archivist Meetings: More than 4 times per year    Marital Status: Married  Human resources officer Violence: Not At Risk (09/12/2021)   Humiliation, Afraid, Rape, and Kick questionnaire    Fear of Current or Ex-Partner: No    Emotionally Abused: No    Physically Abused: No    Sexually Abused: No    Outpatient Medications Prior to Visit  Medication Sig Dispense Refill   acetaminophen (TYLENOL) 500 MG tablet Take 1,000 mg by mouth as needed for moderate pain.      Alirocumab (PRALUENT) 75 MG/ML SOAJ Inject 1 Dose into the skin every 14 (fourteen) days.     aspirin 81 MG chewable tablet Chew 1 tablet (81 mg total) by mouth daily.     bisacodyl (DULCOLAX) 5 MG EC tablet Take 5 mg by mouth every other day.     Cholecalciferol (VITAMIN D3) 50 MCG (2000 UT) TABS Take 3 tablets by mouth daily.     clopidogrel (PLAVIX) 75 MG tablet TAKE ONE TABLET BY MOUTH ONCE DAILY. 30 tablet 9   cyclobenzaprine (FLEXERIL) 10 MG tablet Take 1 tablet by mouth as needed.     dextromethorphan-guaiFENesin (MUCINEX DM) 30-600 MG 12hr tablet Take 1 tablet by mouth as needed.      diclofenac Sodium (VOLTAREN) 1 % GEL Apply topically as needed.     docusate sodium (COLACE) 100 MG capsule Take 200 mg by mouth every other day.     fexofenadine (ALLEGRA) 180 MG tablet Take 180 mg by mouth as needed for allergies or  rhinitis. Rotates with Claritin.     glipiZIDE-metformin (METAGLIP) 5-500 MG tablet TAKE (1) TABLET BY MOUTH  ONCE DAILY AT 12 NOON. 90 tablet 0   isosorbide mononitrate (IMDUR) 60 MG 24 hr tablet Take 1 tablet (60 mg total) by mouth daily. 90 tablet 3   lisinopril (PRINIVIL,ZESTRIL) 5 MG tablet Take 5 mg by mouth daily.     metoprolol succinate (TOPROL-XL) 25 MG 24 hr tablet TAKE 2 TABLETS BY MOUTH ONCE DAILY. 180 tablet 3   nitroGLYCERIN (NITROSTAT) 0.4 MG SL tablet DISSOLVE 1 TABLET UNDER TONGUE EVERY 5 MINUTES UP TO 15 MIN FOR CHEST PAIN. IF NO RELIEF CALL 911. 25 tablet 0   omeprazole (PRILOSEC) 20 MG capsule Take 20 mg by mouth daily.     ONETOUCH ULTRA test strip USE AS DIRECTED UP TO 3 TIMES DAILY IF NEEDED. 100 strip 3   ranolazine (RANEXA) 500 MG 12 hr tablet Take 500 mg by mouth 2 (two) times daily.     sucralfate (CARAFATE) 1 g tablet Take 1 tablet (1 g total) by mouth 4 (four) times daily as needed. 90 tablet 1   thyroid (ARMOUR) 30 MG tablet Take 1 tablet (30 mg total) by mouth daily before breakfast. 30 tablet 3   No facility-administered medications prior to visit.    Allergies  Allergen Reactions   Crestor [Rosuvastatin]     myalgia   Jardiance [Empagliflozin]     Penile swelling and rash   Propoxyphene N-Acetaminophen Nausea Only   Statins Other (See Comments)    Severe muscle cramping/aching/pain/ elevated CK   Tape     Blisters   Zetia [Ezetimibe]     Muscle cramping   Zocor [Simvastatin]     myalgia   Flomax [Tamsulosin Hcl] Rash   Flomax [Tamsulosin] Rash   Levaquin [Levofloxacin Hemihydrate] Rash   Levofloxacin Rash   Penicillin G Diarrhea and Rash   Penicillins Rash    Broke out in rash 6 years ago, pt recently took penicillin (09/2016) and had no reaction Has patient had a PCN reaction causing immediate rash, facial/tongue/throat swelling, SOB or lightheadedness with hypotension: Yes Has patient had a PCN reaction causing severe rash involving mucus membranes or skin necrosis: Unknown Has patient had a PCN reaction that required hospitalization: No Has patient  had a PCN reaction occurring within the last 10 years: Yes If all of the above answers are "NO", then m    ROS Review of Systems  Constitutional:  Negative for chills and fever.  HENT:  Negative for congestion and sore throat.   Eyes:  Negative for pain and discharge.  Respiratory:  Negative for cough and shortness of breath.   Cardiovascular:  Negative for chest pain and palpitations.  Gastrointestinal:  Negative for diarrhea, nausea and vomiting.  Endocrine: Negative for polydipsia and polyuria.  Genitourinary:  Negative for dysuria and hematuria.  Musculoskeletal:  Negative for neck pain and neck stiffness.  Skin:  Negative for rash.  Neurological:  Negative for dizziness, weakness, numbness and headaches.  Psychiatric/Behavioral:  Negative for agitation and behavioral problems.       Objective:    Physical Exam Vitals reviewed.  Constitutional:      General: He is not in acute distress.    Appearance: He is not diaphoretic.  HENT:     Head: Normocephalic and atraumatic.     Nose: Nose normal.     Mouth/Throat:     Mouth: Mucous membranes are moist.  Eyes:  General: No scleral icterus.    Extraocular Movements: Extraocular movements intact.  Cardiovascular:     Rate and Rhythm: Normal rate and regular rhythm.     Pulses: Normal pulses.     Heart sounds: Normal heart sounds. No murmur heard. Pulmonary:     Breath sounds: Normal breath sounds. No wheezing or rales.  Musculoskeletal:     Cervical back: Neck supple. No tenderness.     Right lower leg: No edema.     Left lower leg: No edema.  Skin:    General: Skin is warm.     Findings: No rash.  Neurological:     General: No focal deficit present.     Mental Status: He is alert and oriented to person, place, and time.     Sensory: No sensory deficit.     Motor: No weakness.  Psychiatric:        Mood and Affect: Mood normal.        Behavior: Behavior normal.     BP 120/62 (BP Location: Right Arm, Patient  Position: Sitting, Cuff Size: Normal)   Pulse 63   Resp 18   Ht 6' (1.829 m)   Wt 216 lb 6.4 oz (98.2 kg)   SpO2 96%   BMI 29.35 kg/m  Wt Readings from Last 3 Encounters:  04/17/22 216 lb 6.4 oz (98.2 kg)  03/04/22 212 lb (96.2 kg)  12/09/21 210 lb 3.2 oz (95.3 kg)    Lab Results  Component Value Date   TSH 0.182 (L) 12/09/2021   Lab Results  Component Value Date   WBC 4.8 05/22/2021   HGB 13.5 05/22/2021   HCT 39.9 05/22/2021   MCV 100.8 (H) 05/22/2021   PLT 190 05/22/2021   Lab Results  Component Value Date   NA 139 12/09/2021   K 3.9 12/09/2021   CO2 23 12/09/2021   GLUCOSE 129 (H) 12/09/2021   BUN 15 12/09/2021   CREATININE 1.32 (H) 12/09/2021   BILITOT 0.9 05/22/2021   ALKPHOS 43 05/22/2021   AST 16 05/22/2021   ALT 15 05/22/2021   PROT 6.9 05/22/2021   ALBUMIN 3.6 05/22/2021   CALCIUM 10.2 12/09/2021   ANIONGAP 3 (L) 05/22/2021   EGFR 57 (L) 12/09/2021   Lab Results  Component Value Date   CHOL 135 02/04/2021   Lab Results  Component Value Date   HDL 48 02/04/2021   Lab Results  Component Value Date   LDLCALC 61 02/04/2021   Lab Results  Component Value Date   TRIG 128 02/04/2021   Lab Results  Component Value Date   CHOLHDL 3.2 07/04/2020   Lab Results  Component Value Date   HGBA1C 6.8 (H) 12/09/2021      Assessment & Plan:   Problem List Items Addressed This Visit       Cardiovascular and Mediastinum   DM (diabetes mellitus), type 2 with peripheral vascular complications (HCC) (Chronic)    Lab Results  Component Value Date   HGBA1C 6.8 (H) 12/09/2021  Well-controlled On metformin 5-500 mg QD currently Had given Jardiance for additional cardiac benefit, but had rash and penile swelling, DC Jardiance for now - may give trial of Farxiga later Advised to follow diabetic diet On statin and ACEi F/u BMP and HbA1C Diabetic eye exam: Advised to follow up with Ophthalmology for diabetic eye exam      Relevant Orders    CMP14+EGFR   Hemoglobin A1c   Essential hypertension (Chronic)    BP Readings  from Last 1 Encounters:  04/17/22 120/62  Well-controlled Counseled for compliance with the medications Advised DASH diet and moderate exercise/walking        Endocrine   Hypothyroidism - Primary    Was started on NP thyroid even though his TSH was wnl [facility] his NP thyroid to 60 mg Check TSH and free T4 - decreased dose of NP thyroid to 30 mg QD Last TSH wnl at [facility] clinic      Relevant Orders   TSH + free T4     Other   Hyperlipidemia with target LDL less than 70 (Chronic)    Lipid profile reviewed from [place] chart On Praluent as he did not tolerate statin in the past      Relevant Orders   Lipid Profile   Other Visit Diagnoses     Need for immunization against influenza       Relevant Orders   Flu Vaccine QUAD High Dose(Fluad) (Completed)       No orders of the defined types were placed in this encounter.   Follow-up: Return in about 4 months (around 08/17/2022) for Annual physical.    Lindell Spar, MD

## 2022-04-17 NOTE — Patient Instructions (Addendum)
Please continue taking medications as prescribed.  Please continue to follow low carb diet and perform moderate exercise/walking at least 150 mins/week.  Please get fasting blood tests done before the next visit. 

## 2022-04-17 NOTE — Assessment & Plan Note (Addendum)
Lab Results  Component Value Date   HGBA1C 6.8 (H) 12/09/2021   Well-controlled On metformin 5-500 mg QD currently Had given Jardiance for additional cardiac benefit, but had rash and penile swelling, DC Jardiance for now - may give trial of Farxiga later Advised to follow diabetic diet On statin and ACEi F/u BMP and HbA1C Diabetic eye exam: Advised to follow up with Ophthalmology for diabetic eye exam

## 2022-04-18 NOTE — Assessment & Plan Note (Signed)
[facility] On Praluent as he did not tolerate statin in the past 

## 2022-06-06 ENCOUNTER — Other Ambulatory Visit: Payer: Self-pay | Admitting: Cardiovascular Disease

## 2022-06-06 DIAGNOSIS — Z955 Presence of coronary angioplasty implant and graft: Secondary | ICD-10-CM

## 2022-06-23 ENCOUNTER — Other Ambulatory Visit: Payer: Self-pay | Admitting: Internal Medicine

## 2022-06-23 DIAGNOSIS — E1151 Type 2 diabetes mellitus with diabetic peripheral angiopathy without gangrene: Secondary | ICD-10-CM

## 2022-07-02 ENCOUNTER — Encounter: Payer: Self-pay | Admitting: Gastroenterology

## 2022-07-02 ENCOUNTER — Ambulatory Visit (INDEPENDENT_AMBULATORY_CARE_PROVIDER_SITE_OTHER): Payer: HMO | Admitting: Gastroenterology

## 2022-07-02 ENCOUNTER — Ambulatory Visit: Payer: HMO | Admitting: Gastroenterology

## 2022-07-02 VITALS — BP 126/66 | HR 59 | Temp 97.9°F | Ht 72.0 in | Wt 214.5 lb

## 2022-07-02 DIAGNOSIS — K219 Gastro-esophageal reflux disease without esophagitis: Secondary | ICD-10-CM

## 2022-07-02 DIAGNOSIS — K5900 Constipation, unspecified: Secondary | ICD-10-CM

## 2022-07-02 DIAGNOSIS — D509 Iron deficiency anemia, unspecified: Secondary | ICD-10-CM | POA: Diagnosis not present

## 2022-07-02 MED ORDER — SUCRALFATE 1 GM/10ML PO SUSP: 1.0000 g | Freq: Four times a day (QID) | ORAL | 1 refills | Status: DC

## 2022-07-02 NOTE — Patient Instructions (Signed)
Please have blood work done in January at [facility]. I am rechecking your blood counts.  I have provided a prescription for the carafate solution to use as needed.  We will see you in 1 year or sooner if needed!  Have a great holiday season!  I enjoyed seeing you again today! As you know, I value our relationship and want to provide genuine, compassionate, and quality care. I welcome your feedback. If you receive a survey regarding your visit,  I greatly appreciate you taking time to fill this out. See you next time!  Annitta Needs, PhD, ANP-BC [facility]

## 2022-07-02 NOTE — Progress Notes (Signed)
Gastroenterology Office Note     Primary Care Physician:  Lindell Spar, MD  Primary Gastroenterologist: Dr. Gala Romney    Chief Complaint   Chief Complaint  Patient presents with   Gastroesophageal Reflux    Follow up on GERD and constipation. Would like to change carafate to a liquid. Has a hard time getting tablet dissolved. Takes stool softner daily and doing well with constipation. Has BM about every day and sometimes 2 per day.      History of Present Illness   [name] is a 76 y.o. male presenting today in follow-up with a history of GERD, constipation, prior anemia with IDA component now resolved as of last year.    Uses carafate with reflux flares, which helps a lot. Omeprazole once daily. Carafate taken a few days every few months. No dysphagia. No abdominal pain. Takes one stool softener daily with good results. BM usually daily to BID. No rectal bleeding.   Colonoscopy in 2021: Diverticulosis in the sigmoid and distal ascending colon, single 5 mm polyp (tubular adenoma) removed, cecal AVM ablated.  EGD in 2020 with erythematous mucosa in the stomach consistent with PPI effect.   Past Medical History:  Diagnosis Date   CHF (congestive heart failure) ([place])    Coronary artery disease 08/28/2010   s/p multiple caths 2012, BMS PCI OM1 on August 28, 2010-during NSTEMI; January 2019 non-STEMI- occlusion of OM stent (very late stent thrombosis), initial wire crossed with PTCA, but unable to rewire, PCI aborted--> plan medical therapy, normal EF   Diabetes mellitus    GERD (gastroesophageal reflux disease)    Hyperlipidemia    Hypertension    Non-STEMI (non-ST elevated myocardial infarction) [facility]) January 2012 and 2019   a) Jan 2012: 99% OM1 - BMS PCI; b) Jan 2019: Very late stent thrombosis/100% OM1 -after initially causing him for repeat PTCA restoring flow, unable to recross to place stent. - >  Medical therapy.   NSTEMI (non-ST elevated myocardial infarction) ([place])     Admitted 08/29/17 with NSTEMI-Troponin peak 8.8, normal LVF   PVD (peripheral vascular disease) ([facility])    left SFA PTA & stenting in 02/2005 (Dr. Adora Fridge)   Vitamin D deficiency disease 05/04/2019    Past Surgical History:  Procedure Laterality Date   BIOPSY  08/02/2019   Procedure: BIOPSY;  Surgeon: Daneil Dolin, MD;  [facility];  Service: Endoscopy;;  gastric    CARDIAC CATHETERIZATION  12/23/2004   normal L main, normal LAD, normal L Cfx, RCA with 20% hypodense lesion in first end of vessel (Dr. Adora Fridge)   [place]  08/26/2007   no significant CAD by cath, EF 50% (Dr. Jackie Plum)   COLONOSCOPY  12/2009   Dr. Hampton Abbot   COLONOSCOPY N/A 01/30/2017   pancolonic diverticulosis, non-bleeding internal hemorrhoids.   COLONOSCOPY WITH PROPOFOL N/A 09/08/2019   Procedure: COLONOSCOPY WITH PROPOFOL;  Surgeon: Daneil Dolin, MD;  [facility];  Service: Endoscopy;  Laterality: N/A;  11:15am   CORONARY BALLOON ANGIOPLASTY N/A 08/30/2017   Procedure: CORONARY BALLOON ANGIOPLASTY;  Surgeon: [name], MD;  Location: [place] [facility] LAB;  Service: Cardiovascular;  100% CTO very late stent thrombosis OM1 -> initially crossed with PTCA, but then unable to recross after losing my positioning.  PTCA ABORTED.  UNSUCCESSFUL ATTEMPT   CORONARY BALLOON ANGIOPLASTY  02/10/2011   95% prox in-stent restenosis within OM stent - opened with cutting balloon (Dr. Corky Downs)  CORONARY STENT INTERVENTION  07/17/2011   in-stent restenosis - re-stented with Promus 2.25x56m DES (Dr. DRoni Bread   [place] 08/28/2010   NSTEMI: OM1 99% BMS PCI 2.0x117mMiniVision BMS (Dr. 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 J.Adora Fridge  LEFT HEART CATH AND CORONARY ANGIOGRAPHY N/A 08/30/2017   Procedure: LEFT HEART CATH AND CORONARY ANGIOGRAPHY;  Surgeon: BeLorretta HarpMD;  [facility];  Service: Cardiovascular;  100% very late stent thrombosis of Overlapped BMS-DES OM1 -> attempted PTCA   LEFT HEART CATH AND CORONARY ANGIOGRAPHY  08/28/2010   NSTEMI: OM1 99% BMS PCI  (Dr. J.Adora Fridge  LEFT HEART CATH AND CORONARY ANGIOGRAPHY  09/11/2010   patent stent (Dr. D.Roni Bread  [place]ON[facility]  02/10/2011   95% prox in-stent restenosis within OM stent  (Dr. T.Corky Downs  LEFT HEART CATHETERIZATION WITH CORONARY ANGIOGRAM N/A 07/17/2011   Procedure: LEFT HEART CATHETERIZATION WITH CORONARY ANGIOGRAM;  Surgeon: DaLeonie ManMD;  [facility];  Service: Cardiovascular;  Laterality: N/A;  Right radial approach;  90% ISR of BMS (5 months post PTCA for ISR) --> DES PCI   left knee arthroscopy  05/2016   NM MYOCAR PERF WALL MOTION  09/08/2013   abnormal lexiscan - low to intermediate risk;    POLYPECTOMY  09/08/2019   Procedure: POLYPECTOMY;  Surgeon: RoDaneil DolinMD;  [facility];  Service: Endoscopy;;   TOOTH EXTRACTION Right 06/19/2020   TRANSTHORACIC ECHOCARDIOGRAM  09/01/2017   Normal LV size and function.  EF 66 5%.  Normal wall motion.  GR 1 DD.  Mild aortic sclerosis.  Aortic root mildly dilated at 40 mm.    Current Outpatient Medications  Medication Sig Dispense Refill   acetaminophen (TYLENOL) 500 MG tablet Take 1,000 mg by mouth as needed for moderate pain.      Alirocumab (PRALUENT) 75 MG/ML SOAJ Inject 1 Dose into the skin every 14 (fourteen) days.     aspirin 81 MG chewable  tablet Chew 1 tablet (81 mg total) by mouth daily.     Cholecalciferol (VITAMIN D3) 50 MCG (2000 UT) TABS Take 3 tablets by mouth daily.     clopidogrel (PLAVIX) 75 MG tablet TAKE ONE TABLET BY MOUTH ONCE DAILY. 30 tablet 9   cyclobenzaprine (FLEXERIL) 10 MG tablet Take 1 tablet by mouth as needed.     dextromethorphan-guaiFENesin (MUCINEX DM) 30-600 MG 12hr tablet Take 1 tablet by mouth as needed.      diclofenac Sodium (VOLTAREN) 1 % GEL Apply topically as needed.     docusate sodium (COLACE) 100 MG capsule Take 100 mg by mouth every other day.     fexofenadine (ALLEGRA) 180 MG tablet Take 180 mg by mouth as needed for allergies or rhinitis. Rotates with Claritin.     glipiZIDE-metformin (METAGLIP) 5-500 MG tablet TAKE (1) TABLET BY MOUTH ONCE DAILY AT 12 NOON. 90 tablet 0   isosorbide mononitrate (IMDUR) 60 MG 24 hr tablet  Take 1 tablet (60 mg total) by mouth daily. 90 tablet 3   lisinopril (PRINIVIL,ZESTRIL) 5 MG tablet Take 5 mg by mouth daily.     metoprolol succinate (TOPROL-XL) 25 MG 24 hr tablet TAKE 2 TABLETS BY MOUTH ONCE DAILY. 180 tablet 3   nitroGLYCERIN (NITROSTAT) 0.4 MG SL tablet DISSOLVE 1 TABLET UNDER TONGUE EVERY 5 MINUTES UP TO 15 MIN FOR CHEST PAIN. IF NO RELIEF CALL 911. 25 tablet 0   omeprazole (PRILOSEC) 20 MG capsule Take 20 mg by mouth daily.     ONETOUCH ULTRA test strip USE AS DIRECTED UP TO 3 TIMES DAILY IF NEEDED. 100 strip 3   OVER THE COUNTER MEDICATION Stool softner one daily     ranolazine (RANEXA) 500 MG 12 hr tablet Take 500 mg by mouth 2 (two) times daily.     sucralfate (CARAFATE) 1 g tablet Take 1 tablet (1 g total) by mouth 4 (four) times daily as needed. 90 tablet 1   thyroid (ARMOUR) 30 MG tablet Take 1 tablet (30 mg total) by mouth daily before breakfast. 30 tablet 3   No current facility-administered medications for this visit.    Allergies as of 07/02/2022 - Review Complete 07/02/2022  Allergen Reaction Noted   Crestor [rosuvastatin]  04/26/2020    Jardiance [empagliflozin]  02/19/2022   Propoxyphene n-acetaminophen Nausea Only    Statins Other (See Comments) 07/29/2011   Tape  07/29/2011   Zetia [ezetimibe]  08/30/2017   Zocor [simvastatin]  04/26/2020   Flomax [tamsulosin hcl] Rash 08/16/2014   Flomax [tamsulosin] Rash 11/09/2015   Levaquin [levofloxacin hemihydrate] Rash 07/29/2011   Levofloxacin Rash 11/09/2015   Penicillin g Diarrhea and Rash 11/09/2015   Penicillins Rash     Family History  Problem Relation Age of Onset   Arrhythmia Mother 96   Early death Son    Colon cancer Neg Hx    Liver disease Neg Hx    Inflammatory bowel disease Neg Hx    Colon polyps Neg Hx     Social History   Socioeconomic History   Marital status: Married    Spouse name: Mechele Claude   Number of children: 3   Years of education: 12   Highest education level: Some college, no degree  Occupational History   Occupation: Retired    Fish farm manager: [employer]  Tobacco Use   Smoking status: Former    Packs/day: 1.00    Years: 30.00    Total pack years: 30.00    Types: Cigarettes    Quit date: 05/12/1999    Years since quitting: 23.1    Passive exposure: Past   Smokeless tobacco: Never  Vaping Use   Vaping Use: Never used  Substance and Sexual Activity   Alcohol use: No   Drug use: No   Sexual activity: Yes  Other Topics Concern   Not on file  Social History Narrative   Married for 49 years.Lives with wife.Retired,ex-lab Merchant navy officer.Ex-Marine,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   Social Determinants of Health   Financial Resource Strain: Low Risk  (09/12/2021)   Overall Financial Resource Strain (CARDIA)    Difficulty of Paying Living Expenses: Not hard at all  Food Insecurity: No Food Insecurity (09/12/2021)   Hunger Vital Sign    Worried About Running Out of Food in the Last Year: Never true    Ran Out of Food in the Last Year: Never true  Transportation Needs: No Transportation Needs (09/12/2021)   PRAPARE - Transportation    Lack of  Transportation (Medical): No    Lack of Transportation (Non-Medical): No  Physical Activity: Sufficiently Active (09/12/2021)   Exercise Vital Sign    Days of Exercise per Week: 7 days    Minutes of Exercise per Session: 60 min  Stress: No Stress Concern Present (09/12/2021)   [place]    Feeling of Stress : Not at all  Social Connections: [place] (09/12/2021)   Social Connection and Isolation Panel [NHANES]    Frequency of Communication with Friends and Family: More than three times a week    Frequency of Social Gatherings with Friends and Family: Three times a week    Attends Religious Services: More than 4 times per year    Active Member of Clubs or Organizations: Yes    Attends Archivist Meetings: More than 4 times per year    Marital Status: Married  Human resources officer Violence: Not At Risk (09/12/2021)   Humiliation, Afraid, Rape, and Kick questionnaire    Fear of Current or Ex-Partner: No    Emotionally Abused: No    Physically Abused: No    Sexually Abused: No     Review of Systems   Gen: Denies any fever, chills, fatigue, weight loss, lack of appetite.  CV: Denies chest pain, heart palpitations, peripheral edema, syncope.  Resp: Denies shortness of breath at rest or with exertion. Denies wheezing or cough.  GI: Denies dysphagia or odynophagia. Denies jaundice, hematemesis, fecal incontinence. GU : Denies urinary burning, urinary frequency, urinary hesitancy MS: Denies joint pain, muscle weakness, cramps, or limitation of movement.  Derm: Denies rash, itching, dry skin Psych: Denies depression, anxiety, memory loss, and confusion Heme: Denies bruising, bleeding, and enlarged lymph nodes.   Physical Exam   BP 126/66 (BP Location: Left Arm, Patient Position: Sitting, Cuff Size: Large)   Pulse (!) 59   Temp 97.9 F (36.6 C) (Oral)   Ht 6' (1.829 m)   Wt 214 lb 8 oz (97.3 kg)   BMI  29.09 kg/m  General:   Alert and oriented. Pleasant and cooperative. Well-nourished and well-developed.  Head:  Normocephalic and atraumatic. Eyes:  Without icterus Abdomen:  +BS, soft, non-tender and non-distended. No HSM noted. No guarding or rebound. No masses appreciated.  Rectal:  Deferred  Msk:  Symmetrical without gross deformities. Normal posture. Extremities:  Without edema. Neurologic:  Alert and  oriented x4;  grossly normal neurologically. Skin:  Intact without significant lesions or rashes. Psych:  Alert and cooperative. Normal mood and affect.  Lab Results  Component Value Date   WBC 4.8 05/22/2021   HGB 13.5 05/22/2021   HCT 39.9 05/22/2021   MCV 100.8 (H) 05/22/2021   PLT 190 05/22/2021      Assessment   [name] is a 76 y.o. male presenting today in follow-up with a history of  GERD, constipation, prior anemia with IDA component now resolved as of last year. Routine follow-up.  GERD: well controlled on omeprazole daily. Uses carafate prn and requesting liquid solution. I have sent this to pharmacy and provided printed rx he may give to the [place].   Constipation: excellent results on stool softener once daily.   Anemia: history of IDA, resolved last year. Will recheck CBC with labs upcoming in January already scheduled with PCP. Orders given today.     PLAN    Continue PPI daily Carafate prn, rx provided CBC in Jan Return 1 year   Annitta Needs, PhD,  ANP-BC [facility]

## 2022-08-05 ENCOUNTER — Ambulatory Visit: Payer: HMO | Admitting: Urology

## 2022-08-12 ENCOUNTER — Ambulatory Visit: Payer: HMO | Admitting: Urology

## 2022-08-13 LAB — CBC WITH DIFFERENTIAL/PLATELET
Basophils Absolute: 0 10*3/uL (ref 0.0–0.2)
Basos: 1 %
EOS (ABSOLUTE): 0.1 10*3/uL (ref 0.0–0.4)
Eos: 2 %
Hematocrit: 43.5 % (ref 37.5–51.0)
Hemoglobin: 14.8 g/dL (ref 13.0–17.7)
Immature Grans (Abs): 0 10*3/uL (ref 0.0–0.1)
Immature Granulocytes: 0 %
Lymphocytes Absolute: 2.5 10*3/uL (ref 0.7–3.1)
Lymphs: 43 %
MCH: 33.7 pg — ABNORMAL HIGH (ref 26.6–33.0)
MCHC: 34 g/dL (ref 31.5–35.7)
MCV: 99 fL — ABNORMAL HIGH (ref 79–97)
Monocytes Absolute: 0.5 10*3/uL (ref 0.1–0.9)
Monocytes: 8 %
Neutrophils Absolute: 2.6 10*3/uL (ref 1.4–7.0)
Neutrophils: 46 %
Platelets: 169 10*3/uL (ref 150–450)
RBC: 4.39 x10E6/uL (ref 4.14–5.80)
RDW: 12.2 % (ref 11.6–15.4)
WBC: 5.7 10*3/uL (ref 3.4–10.8)

## 2022-08-13 LAB — CMP14+EGFR
ALT: 19 IU/L (ref 0–44)
AST: 23 IU/L (ref 0–40)
Albumin/Globulin Ratio: 1.4 (ref 1.2–2.2)
Albumin: 4 g/dL (ref 3.8–4.8)
Alkaline Phosphatase: 49 IU/L (ref 44–121)
BUN/Creatinine Ratio: 10 (ref 10–24)
BUN: 14 mg/dL (ref 8–27)
Bilirubin Total: 0.6 mg/dL (ref 0.0–1.2)
CO2: 23 mmol/L (ref 20–29)
Calcium: 10.2 mg/dL (ref 8.6–10.2)
Chloride: 106 mmol/L (ref 96–106)
Creatinine, Ser: 1.34 mg/dL — ABNORMAL HIGH (ref 0.76–1.27)
Globulin, Total: 2.9 g/dL (ref 1.5–4.5)
Glucose: 151 mg/dL — ABNORMAL HIGH (ref 70–99)
Potassium: 4 mmol/L (ref 3.5–5.2)
Sodium: 142 mmol/L (ref 134–144)
Total Protein: 6.9 g/dL (ref 6.0–8.5)
eGFR: 55 mL/min/1.73 — ABNORMAL LOW (ref >59)

## 2022-08-13 LAB — LIPID PANEL
Chol/HDL Ratio: 4.4 ratio (ref 0.0–5.0)
Cholesterol, Total: 159 mg/dL (ref 100–199)
HDL: 36 mg/dL — ABNORMAL LOW (ref >39)
LDL Chol Calc (NIH): 76 mg/dL (ref 0–99)
Triglycerides: 287 mg/dL — ABNORMAL HIGH (ref 0–149)
VLDL Cholesterol Cal: 47 mg/dL — ABNORMAL HIGH (ref 5–40)

## 2022-08-13 LAB — HEMOGLOBIN A1C
Est. average glucose Bld gHb Est-mCnc: 169 mg/dL
Hgb A1c MFr Bld: 7.5 % — ABNORMAL HIGH (ref 4.8–5.6)

## 2022-08-13 LAB — TSH+FREE T4
Free T4: 0.88 ng/dL (ref 0.82–1.77)
TSH: 0.383 u[IU]/mL — ABNORMAL LOW (ref 0.450–4.500)

## 2022-08-18 NOTE — Progress Notes (Signed)
History of Present Illness:   [name] returns for followup of BPH.   1st visit w/ [place] in 2016--previous evaluation by Dr Michela Pitcher in 2015 included a cystoscopy as well as renal U/S by Dr Michela Pitcher in 2015. At that time kidneys were normal, prostate volume was 62 mL, pvr volume was 75 mL. He has been tried on Flomax as well as Rapaflo, both of which caused rashes.  Past Medical History:  Diagnosis Date   CHF (congestive heart failure) ([place])    Coronary artery disease 08/28/2010   s/p multiple caths 2012, BMS PCI OM1 on August 28, 2010-during NSTEMI; January 2019 non-STEMI- occlusion of OM stent (very late stent thrombosis), initial wire crossed with PTCA, but unable to rewire, PCI aborted--> plan medical therapy, normal EF   Diabetes mellitus    GERD (gastroesophageal reflux disease)    Hyperlipidemia    Hypertension    Non-STEMI (non-ST elevated myocardial infarction) [facility]) January 2012 and 2019   a) Jan 2012: 99% OM1 - BMS PCI; b) Jan 2019: Very late stent thrombosis/100% OM1 -after initially causing him for repeat PTCA restoring flow, unable to recross to place stent. - >  Medical therapy.   NSTEMI (non-ST elevated myocardial infarction) ([place])    Admitted 08/29/17 with NSTEMI-Troponin peak 8.8, normal LVF   PVD (peripheral vascular disease) ([facility])    left SFA PTA & stenting in 02/2005 (Dr. Adora Fridge)   Vitamin D deficiency disease 05/04/2019    Past Surgical History:  Procedure Laterality Date   BIOPSY  08/02/2019   Procedure: BIOPSY;  Surgeon: Daneil Dolin, MD;  [facility];  Service: Endoscopy;;  gastric    CARDIAC CATHETERIZATION  12/23/2004   normal L main, normal LAD, normal L Cfx, RCA with 20% hypodense lesion in first end of vessel (Dr. Adora Fridge)   [place]  08/26/2007   no significant CAD by cath, EF 50% (Dr. Jackie Plum)   COLONOSCOPY  12/2009   Dr. Hampton Abbot   COLONOSCOPY N/A 01/30/2017   pancolonic diverticulosis, non-bleeding internal hemorrhoids.    COLONOSCOPY WITH PROPOFOL N/A 09/08/2019   Procedure: COLONOSCOPY WITH PROPOFOL;  Surgeon: Daneil Dolin, MD;  [facility];  Service: Endoscopy;  Laterality: N/A;  11:15am   CORONARY BALLOON ANGIOPLASTY N/A 08/30/2017   Procedure: CORONARY BALLOON ANGIOPLASTY;  Surgeon: [name], MD;  Location: [place] [facility] LAB;  Service: Cardiovascular;  100% CTO very late stent thrombosis OM1 -> initially crossed with PTCA, but then unable to recross after losing my positioning.  PTCA ABORTED.  UNSUCCESSFUL ATTEMPT   CORONARY BALLOON ANGIOPLASTY  02/10/2011   95% prox in-stent restenosis within OM stent - opened with cutting balloon (Dr. Corky Downs)   CORONARY STENT INTERVENTION  07/17/2011   in-stent restenosis - re-stented with Promus 2.25x51m DES (Dr. DRoni Bread   [place] 08/[place]   NSTEMI: OM1 99% BMS PCI 2.0x156mMiniVision BMS (Dr. 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 J.Adora Fridge  LEFT HEART CATH AND CORONARY ANGIOGRAPHY N/A 08/30/2017  Procedure: LEFT HEART CATH AND CORONARY ANGIOGRAPHY;  Surgeon: [name], MD;  Location: [place] [facility] LAB;  Service: Cardiovascular;  100% very late stent thrombosis of Overlapped BMS-DES OM1 -> attempted PTCA   LEFT HEART CATH AND CORONARY ANGIOGRAPHY  08/28/2010   NSTEMI: OM1 99% BMS PCI  (Dr. Adora Fridge)   LEFT HEART CATH AND CORONARY ANGIOGRAPHY  09/11/2010   patent stent (Dr. Roni Bread)   [place] CATH AND  CORONARY ANGIOGRAPHY  02/10/2011   95% prox in-stent restenosis within OM stent  (Dr. Corky Downs)   LEFT HEART CATHETERIZATION WITH CORONARY ANGIOGRAM N/A 07/17/2011   Procedure: LEFT HEART CATHETERIZATION WITH CORONARY ANGIOGRAM;  Surgeon: Leonie Man, MD;  Location: [facility] CATH LAB;  Service: Cardiovascular;  Laterality: N/A;  Right radial approach;  90% ISR of BMS (5 months post PTCA for ISR) --> DES PCI   left knee arthroscopy  05/2016   NM MYOCAR PERF WALL MOTION  09/08/2013   abnormal lexiscan - low to intermediate risk;    POLYPECTOMY  09/08/2019   Procedure: POLYPECTOMY;  Surgeon: Daneil Dolin, MD;  [facility];  Service: Endoscopy;;   TOOTH EXTRACTION Right 06/19/2020   TRANSTHORACIC ECHOCARDIOGRAM  09/01/2017   Normal LV size and function.  EF 66 5%.  Normal wall motion.  GR 1 DD.  Mild aortic sclerosis.  Aortic root mildly dilated at 40 mm.    Home Medications:  Allergies as of 08/19/2022       Reactions   Crestor [rosuvastatin]    myalgia   Jardiance [empagliflozin]    Penile swelling and rash   Propoxyphene N-acetaminophen Nausea Only   Statins Other (See Comments)   Severe muscle cramping/aching/pain/ elevated CK   Tape    Blisters   Zetia [ezetimibe]    Muscle cramping   Zocor [simvastatin]    myalgia   Flomax [tamsulosin Hcl] Rash   Flomax [tamsulosin] Rash   Levaquin [levofloxacin Hemihydrate] Rash   Levofloxacin Rash   Penicillin G Diarrhea, Rash   Penicillins Rash   Broke out in rash 6 years ago, pt recently took penicillin (09/2016) and had no reaction Has patient had a PCN reaction causing immediate rash, facial/tongue/throat swelling, SOB or lightheadedness with hypotension: Yes Has patient had a PCN reaction causing severe rash involving mucus membranes or skin necrosis: Unknown Has patient had a PCN reaction that required hospitalization: No Has patient had a PCN reaction occurring within the last 10 years: Yes If all of the above answers are  "NO", then m        Medication List        Accurate as of August 18, 2022  7:44 PM. If you have any questions, ask your nurse or doctor.          acetaminophen 500 MG tablet Commonly known as: TYLENOL Take 1,000 mg by mouth as needed for moderate pain.   aspirin 81 MG chewable tablet Chew 1 tablet (81 mg total) by mouth daily.   clopidogrel 75 MG tablet Commonly known as: PLAVIX TAKE ONE TABLET BY MOUTH ONCE DAILY.   cyclobenzaprine 10 MG tablet Commonly known as: FLEXERIL Take 1 tablet by mouth as needed.   dextromethorphan-guaiFENesin 30-600 MG 12hr tablet Commonly known as: MUCINEX DM Take 1 tablet by mouth as needed.   diclofenac Sodium 1 % Gel Commonly known as: VOLTAREN Apply topically as needed.   docusate sodium 100 MG capsule Commonly known as: COLACE Take 100 mg by mouth  every other day.   fexofenadine 180 MG tablet Commonly known as: ALLEGRA Take 180 mg by mouth as needed for allergies or rhinitis. Rotates with Claritin.   glipiZIDE-metformin 5-500 MG tablet Commonly known as: METAGLIP TAKE (1) TABLET BY MOUTH ONCE DAILY AT 12 NOON.   isosorbide mononitrate 60 MG 24 hr tablet Commonly known as: IMDUR Take 1 tablet (60 mg total) by mouth daily.   lisinopril 5 MG tablet Commonly known as: ZESTRIL Take 5 mg by mouth daily.   metoprolol succinate 25 MG 24 hr tablet Commonly known as: TOPROL-XL TAKE 2 TABLETS BY MOUTH ONCE DAILY.   nitroGLYCERIN 0.4 MG SL tablet Commonly known as: NITROSTAT DISSOLVE 1 TABLET UNDER TONGUE EVERY 5 MINUTES UP TO 15 MIN FOR CHEST PAIN. IF NO RELIEF CALL 911.   omeprazole 20 MG capsule Commonly known as: PRILOSEC Take 20 mg by mouth daily.   OneTouch Ultra test strip Generic drug: glucose blood USE AS DIRECTED UP TO 3 TIMES DAILY IF NEEDED.   OVER THE COUNTER MEDICATION Stool softner one daily   Praluent 75 MG/ML Soaj Generic drug: Alirocumab Inject 1 Dose into the skin every 14 (fourteen) days.    ranolazine 500 MG 12 hr tablet Commonly known as: RANEXA Take 500 mg by mouth 2 (two) times daily.   sucralfate 1 g tablet Commonly known as: Carafate Take 1 tablet (1 g total) by mouth 4 (four) times daily as needed.   sucralfate 1 GM/10ML suspension Commonly known as: CARAFATE Take 10 mLs (1 g total) by mouth 4 (four) times daily.   thyroid 30 MG tablet Commonly known as: ARMOUR Take 1 tablet (30 mg total) by mouth daily before breakfast.   Vitamin D3 50 MCG (2000 UT) Tabs Take 3 tablets by mouth daily.        Allergies:  Allergies  Allergen Reactions   Crestor [Rosuvastatin]     myalgia   Jardiance [Empagliflozin]     Penile swelling and rash   Propoxyphene N-Acetaminophen Nausea Only   Statins Other (See Comments)    Severe muscle cramping/aching/pain/ elevated CK   Tape     Blisters   Zetia [Ezetimibe]     Muscle cramping   Zocor [Simvastatin]     myalgia   Flomax [Tamsulosin Hcl] Rash   Flomax [Tamsulosin] Rash   Levaquin [Levofloxacin Hemihydrate] Rash   Levofloxacin Rash   Penicillin G Diarrhea and Rash   Penicillins Rash    Broke out in rash 6 years ago, pt recently took penicillin (09/2016) and had no reaction Has patient had a PCN reaction causing immediate rash, facial/tongue/throat swelling, SOB or lightheadedness with hypotension: Yes Has patient had a PCN reaction causing severe rash involving mucus membranes or skin necrosis: Unknown Has patient had a PCN reaction that required hospitalization: No Has patient had a PCN reaction occurring within the last 10 years: Yes If all of the above answers are "NO", then m    Family History  Problem Relation Age of Onset   Arrhythmia Mother 22   Early death Son    Colon cancer Neg Hx    Liver disease Neg Hx    Inflammatory bowel disease Neg Hx    Colon polyps Neg Hx     Social History:  reports that he quit smoking about 23 years ago. His smoking use included cigarettes. He has a 30.00 pack-year  smoking history. He has been exposed to tobacco smoke. He has never used smokeless tobacco. He reports that he does  not drink alcohol and does not use drugs.  ROS: A complete review of systems was performed.  All systems are negative except for pertinent findings as noted.  Physical Exam:  Vital signs in last 24 hours: There were no vitals taken for this visit. Constitutional:  Alert and oriented, No acute distress Cardiovascular: Regular rate  Respiratory: Normal respiratory effort GI: Abdomen is soft, nontender, nondistended, no abdominal masses. No CVAT.  Genitourinary: Normal male phallus, testes are descended bilaterally and non-tender and without masses, scrotum is normal in appearance without lesions or masses, perineum is normal on inspection. Lymphatic: No lymphadenopathy Neurologic: Grossly intact, no focal deficits Psychiatric: Normal mood and affect  I have reviewed prior pt notes  I have reviewed notes from referring/previous physicians  I have reviewed urinalysis results  I have independently reviewed prior imaging  I have reviewed prior PSA results  I have reviewed prior urine culture   Impression/Assessment:  ***  Plan:  ***

## 2022-08-19 ENCOUNTER — Ambulatory Visit: Payer: HMO | Admitting: Urology

## 2022-08-19 ENCOUNTER — Encounter: Payer: Self-pay | Admitting: Urology

## 2022-08-19 VITALS — BP 124/72 | HR 59

## 2022-08-19 DIAGNOSIS — R351 Nocturia: Secondary | ICD-10-CM | POA: Diagnosis not present

## 2022-08-19 DIAGNOSIS — Z125 Encounter for screening for malignant neoplasm of prostate: Secondary | ICD-10-CM

## 2022-08-19 DIAGNOSIS — N401 Enlarged prostate with lower urinary tract symptoms: Secondary | ICD-10-CM

## 2022-08-19 DIAGNOSIS — R3915 Urgency of urination: Secondary | ICD-10-CM | POA: Diagnosis not present

## 2022-08-19 DIAGNOSIS — N432 Other hydrocele: Secondary | ICD-10-CM | POA: Diagnosis not present

## 2022-08-19 DIAGNOSIS — N138 Other obstructive and reflux uropathy: Secondary | ICD-10-CM

## 2022-08-19 LAB — URINALYSIS, ROUTINE W REFLEX MICROSCOPIC
Bilirubin, UA: NEGATIVE
Glucose, UA: NEGATIVE
Ketones, UA: NEGATIVE
Leukocytes,UA: NEGATIVE
Nitrite, UA: NEGATIVE
RBC, UA: NEGATIVE
Specific Gravity, UA: 1.025 (ref 1.005–1.030)
Urobilinogen, Ur: 1 mg/dL (ref 0.2–1.0)
pH, UA: 5 (ref 5.0–7.5)

## 2022-08-19 LAB — MICROSCOPIC EXAMINATION
Bacteria, UA: NONE SEEN
RBC, Urine: NONE SEEN /hpf (ref 0–2)

## 2022-08-20 ENCOUNTER — Encounter: Payer: Self-pay | Admitting: Internal Medicine

## 2022-08-20 ENCOUNTER — Ambulatory Visit (INDEPENDENT_AMBULATORY_CARE_PROVIDER_SITE_OTHER): Payer: HMO | Admitting: Internal Medicine

## 2022-08-20 VITALS — BP 127/72 | HR 70 | Ht 72.0 in | Wt 211.2 lb

## 2022-08-20 DIAGNOSIS — Z0001 Encounter for general adult medical examination with abnormal findings: Secondary | ICD-10-CM | POA: Diagnosis not present

## 2022-08-20 DIAGNOSIS — I739 Peripheral vascular disease, unspecified: Secondary | ICD-10-CM | POA: Diagnosis not present

## 2022-08-20 DIAGNOSIS — E039 Hypothyroidism, unspecified: Secondary | ICD-10-CM

## 2022-08-20 DIAGNOSIS — I2510 Atherosclerotic heart disease of native coronary artery without angina pectoris: Secondary | ICD-10-CM

## 2022-08-20 DIAGNOSIS — E1151 Type 2 diabetes mellitus with diabetic peripheral angiopathy without gangrene: Secondary | ICD-10-CM

## 2022-08-20 DIAGNOSIS — I10 Essential (primary) hypertension: Secondary | ICD-10-CM

## 2022-08-20 DIAGNOSIS — E785 Hyperlipidemia, unspecified: Secondary | ICD-10-CM

## 2022-08-20 DIAGNOSIS — Z9861 Coronary angioplasty status: Secondary | ICD-10-CM

## 2022-08-20 LAB — PSA: Prostate Specific Ag, Serum: 1.3 ng/mL (ref 0.0–4.0)

## 2022-08-20 MED ORDER — RYBELSUS 3 MG PO TABS: 3.0000 mg | ORAL_TABLET | Freq: Every day | ORAL | 0 refills | Status: DC

## 2022-08-20 MED ORDER — THYROID 15 MG PO TABS: 15.0000 mg | ORAL_TABLET | Freq: Every day | ORAL | 1 refills | Status: DC

## 2022-08-20 NOTE — Patient Instructions (Signed)
Please start taking Rybelsus as prescribed.  Please start taking NP thyroid 15 mg instead of 30 mg once daily.  Please continue taking other medications as prescribed.  Please continue to follow low carb diet and ambulate as tolerated.

## 2022-08-20 NOTE — Assessment & Plan Note (Addendum)
Lab Results  Component Value Date   HGBA1C 7.5 (H) 08/12/2022   Uncontrolled On metformin 5-500 mg QD currently Added Rybelsus 3 mg QD, increase dose as tolerated Had given Jardiance for additional cardiac benefit, but had rash and penile swelling, DC Jardiance Advised to follow diabetic diet On statin and ACEi F/u BMP and HbA1C Diabetic eye exam: Advised to follow up with Ophthalmology for diabetic eye exam

## 2022-08-20 NOTE — Progress Notes (Signed)
Established Patient Office Visit  Subjective:  Patient ID: [name], male    DOB: 01-27-1946  Age: 77 y.o. MRN: [id number]  CC:  Chief Complaint  Patient presents with   Annual Exam    HPI [name] is a 77 y.o. male with past medical history of HTN, CAD, PAD, type II DM, GERD, HLD and hypothyroidism who presents for annual physical.  CAD, PAD and HTN: He has had cardiac stents and stent in LLE.  He follows up with Dr. Gwenlyn Found for it.  He takes aspirin, Plavix and Praluent for it.  He takes lisinopril and metoprolol as well.  He denies any chest pain, dyspnea or palpitations currently.  He is on Imdur and Ranexa for angina.  Type II DM: He is on Metaglip 5-500 mg QD currently.  His last HbA1c was 7.1 at [facility] clinic, but has increased to 7.5 now.  He used to take Metaglip BID in the past, which caused episodes of hypoglycemia.  His blood glucose ranges around 120 most of the time now.  He denies any polyuria or polydipsia currently.  He was started on NP thyroid even though his TSH was normal by his previous PCP.  I decreased his dose of NP thyroid to 30 mg recently, but still shows low TSH.  He denies any fatigue, recent change in weight or appetite.  Denies any tremors or palpitations currently.     Past Medical History:  Diagnosis Date   CHF (congestive heart failure) ([place])    Coronary artery disease 08/28/2010   s/p multiple caths 2012, BMS PCI OM1 on August 28, 2010-during NSTEMI; January 2019 non-STEMI- occlusion of OM stent (very late stent thrombosis), initial wire crossed with PTCA, but unable to rewire, PCI aborted--> plan medical therapy, normal EF   Diabetes mellitus    GERD (gastroesophageal reflux disease)    Hyperlipidemia    Hypertension    Non-STEMI (non-ST elevated myocardial infarction) [facility]) January 2012 and 2019   a) Jan 2012: 99% OM1 - BMS PCI; b) Jan 2019: Very late stent thrombosis/100% OM1 -after initially causing him for repeat PTCA restoring flow,  unable to recross to place stent. - >  Medical therapy.   NSTEMI (non-ST elevated myocardial infarction) ([place])    Admitted 08/29/17 with NSTEMI-Troponin peak 8.8, normal LVF   PVD (peripheral vascular disease) ([facility])    left SFA PTA & stenting in 02/2005 (Dr. Adora Fridge)   Vitamin D deficiency disease 05/04/2019    Past Surgical History:  Procedure Laterality Date   BIOPSY  08/02/2019   Procedure: BIOPSY;  Surgeon: Daneil Dolin, MD;  [facility];  Service: Endoscopy;;  gastric    CARDIAC CATHETERIZATION  12/23/2004   normal L main, normal LAD, normal L Cfx, RCA with 20% hypodense lesion in first end of vessel (Dr. Adora Fridge)   [place]  08/26/2007   no significant CAD by cath, EF 50% (Dr. Jackie Plum)   COLONOSCOPY  12/2009   Dr. Hampton Abbot   COLONOSCOPY N/A 01/30/2017   pancolonic diverticulosis, non-bleeding internal hemorrhoids.   COLONOSCOPY WITH PROPOFOL N/A 09/08/2019   Procedure: COLONOSCOPY WITH PROPOFOL;  Surgeon: Daneil Dolin, MD;  [facility];  Service: Endoscopy;  Laterality: N/A;  11:15am   CORONARY BALLOON ANGIOPLASTY N/A 08/30/2017   Procedure: CORONARY BALLOON ANGIOPLASTY;  Surgeon: [name], MD;  Location: [facility];  Service: Cardiovascular;  100% CTO very late stent thrombosis OM1 -> initially crossed  with PTCA, but then unable to recross after losing my positioning.  PTCA ABORTED.  UNSUCCESSFUL ATTEMPT   CORONARY BALLOON ANGIOPLASTY  02/10/2011   95% prox in-stent restenosis within OM stent - opened with cutting balloon (Dr. Corky Downs)   CORONARY STENT INTERVENTION  07/17/2011   in-stent restenosis - re-stented with Promus 2.25x25m DES (Dr. DRoni Bread   [place] 08/28/2010   NSTEMI: OM1 99% BMS PCI 2.0x175mMiniVision BMS (Dr. 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 J.Adora Fridge  LEFT HEART CATH AND CORONARY ANGIOGRAPHY N/A 08/30/2017   Procedure: LEFT HEART CATH AND CORONARY ANGIOGRAPHY;  Surgeon: BeLorretta HarpMD;  [facility];  Service: Cardiovascular;  100% very late stent thrombosis of Overlapped BMS-DES OM1 -> attempted PTCA   LEFT HEART CATH AND CORONARY ANGIOGRAPHY  08/28/2010   NSTEMI: OM1 99% BMS PCI  (Dr. J.Adora Fridge  LEFT HEART CATH AND CORONARY ANGIOGRAPHY  09/11/2010   patent stent (Dr. D.Roni Bread  LENaplesATH AND CORONARY ANGIOGRAPHY  02/10/2011   95% prox in-stent restenosis within OM stent  (Dr. T.Corky Downs  LEFT HEART CATHETERIZATION WITH CORONARY ANGIOGRAM N/A 07/17/2011   Procedure: LEFT HEART CATHETERIZATION WITH CORONARY ANGIOGRAM;  Surgeon: DaLeonie ManMD;  [facility];  Service: Cardiovascular;  Laterality: N/A;  Right radial approach;  90% ISR of BMS (5 months post PTCA for ISR) --> DES PCI   left knee arthroscopy  05/2016   NM MYOCAR PERF WALL MOTION  09/08/2013   abnormal lexiscan - low to intermediate risk;    POLYPECTOMY  09/08/2019   Procedure: POLYPECTOMY;  Surgeon: RoDaneil DolinMD;  [facility];  Service: Endoscopy;;   TOOTH EXTRACTION Right 06/19/2020   TRANSTHORACIC ECHOCARDIOGRAM  09/01/2017   Normal LV size and function.  EF 66 5%.  Normal wall motion.  GR 1 DD.  Mild aortic sclerosis.  Aortic root mildly dilated at 40 mm.    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   Social Determinants of Health   Financial Resource Strain: Low Risk  (09/12/2021)   Overall Financial Resource Strain (CARDIA)    Difficulty of Paying Living Expenses: Not hard at all  Food Insecurity: No Food Insecurity (09/12/2021)   Hunger Vital Sign    Worried About Running Out of Food in the Last Year: Never true    Ran Out of Food in the Last Year: Never true  Transportation Needs: No Transportation Needs (09/12/2021)   PRAPARE - Hydrologist (Medical): No    Lack of Transportation (Non-Medical): No  Physical Activity: Sufficiently Active (09/12/2021)   Exercise Vital Sign    Days of Exercise per Week: 7 days    Minutes of Exercise per Session: 60 min  Stress: No Stress Concern Present (09/12/2021)   [place]    Feeling of Stress : Not at all  Social Connections: [place] (09/12/2021)   Social Connection and Isolation Panel [NHANES]    Frequency of Communication with Friends and Family: More than three times a week    Frequency of Social Gatherings with Friends and  Family: Three times a week    Attends Religious Services: More than 4 times per year    Active Member of Clubs or Organizations: Yes    Attends Archivist Meetings: More than 4 times per year    Marital Status: Married  Human resources officer Violence: Not At Risk (09/12/2021)   Humiliation, Afraid, Rape, and Kick questionnaire    Fear of Current or Ex-Partner: No    Emotionally Abused: No    Physically Abused: No    Sexually Abused: No    Outpatient Medications Prior to Visit  Medication Sig Dispense Refill   acetaminophen (TYLENOL) 500 MG tablet Take 1,000 mg by mouth as needed for moderate pain.      Alirocumab (PRALUENT) 75 MG/ML SOAJ Inject 1 Dose into the skin every 14 (fourteen) days.     aspirin 81 MG chewable tablet Chew 1 tablet (81 mg total) by mouth daily.     Cholecalciferol (VITAMIN D3) 50 MCG (2000 UT) TABS Take 3 tablets by mouth daily.     clopidogrel (PLAVIX) 75 MG tablet TAKE ONE TABLET BY MOUTH ONCE DAILY. 30 tablet 9   cyclobenzaprine (FLEXERIL) 10 MG tablet Take 1 tablet by mouth as needed.     dextromethorphan-guaiFENesin (MUCINEX DM) 30-600 MG 12hr tablet Take 1 tablet by mouth as needed.  (Patient not taking: Reported on 08/19/2022)     diclofenac Sodium (VOLTAREN) 1 % GEL Apply topically as needed.     docusate sodium (COLACE) 100 MG capsule Take 100 mg by mouth every other day.     fexofenadine (ALLEGRA) 180 MG tablet Take 180 mg by mouth as needed for allergies or rhinitis. Rotates with Claritin.     glipiZIDE-metformin (METAGLIP) 5-500 MG tablet TAKE (1) TABLET BY MOUTH ONCE DAILY AT 12 NOON. 90 tablet 0   isosorbide mononitrate (IMDUR) 60 MG 24 hr tablet Take 1 tablet (60 mg total) by mouth daily. 90 tablet 3   lisinopril (PRINIVIL,ZESTRIL) 5 MG tablet Take 5 mg by mouth daily.     metoprolol succinate (TOPROL-XL) 25 MG 24 hr tablet TAKE 2 TABLETS BY MOUTH ONCE DAILY.  180 tablet 3   nitroGLYCERIN (NITROSTAT) 0.4 MG SL tablet DISSOLVE 1 TABLET UNDER TONGUE  EVERY 5 MINUTES UP TO 15 MIN FOR CHEST PAIN. IF NO RELIEF CALL 911. 25 tablet 0   omeprazole (PRILOSEC) 20 MG capsule Take 20 mg by mouth daily.     ONETOUCH ULTRA test strip USE AS DIRECTED UP TO 3 TIMES DAILY IF NEEDED. 100 strip 3   OVER THE COUNTER MEDICATION Stool softner one daily     ranolazine (RANEXA) 500 MG 12 hr tablet Take 500 mg by mouth 2 (two) times daily.     sucralfate (CARAFATE) 1 g tablet Take 1 tablet (1 g total) by mouth 4 (four) times daily as needed. 90 tablet 1   sucralfate (CARAFATE) 1 GM/10ML suspension Take 10 mLs (1 g total) by mouth 4 (four) times daily. 420 mL 1   thyroid (ARMOUR) 30 MG tablet Take 1 tablet (30 mg total) by mouth daily before breakfast. 30 tablet 3   No facility-administered medications prior to visit.    Allergies  Allergen Reactions   Crestor [Rosuvastatin]     myalgia   Jardiance [Empagliflozin]     Penile swelling and rash   Propoxyphene N-Acetaminophen Nausea Only   Statins Other (See Comments)    Severe muscle cramping/aching/pain/ elevated CK   Tape     Blisters   Zetia [Ezetimibe]     Muscle cramping   Zocor [Simvastatin]     myalgia   Flomax [Tamsulosin Hcl] Rash   Flomax [Tamsulosin] Rash   Levaquin [Levofloxacin Hemihydrate] Rash   Levofloxacin Rash   Penicillin G Diarrhea and Rash   Penicillins Rash    Broke out in rash 6 years ago, pt recently took penicillin (09/2016) and had no reaction Has patient had a PCN reaction causing immediate rash, facial/tongue/throat swelling, SOB or lightheadedness with hypotension: Yes Has patient had a PCN reaction causing severe rash involving mucus membranes or skin necrosis: Unknown Has patient had a PCN reaction that required hospitalization: No Has patient had a PCN reaction occurring within the last 10 years: Yes If all of the above answers are "NO", then m    ROS Review of Systems  Constitutional:  Negative for chills and fever.  HENT:  Negative for congestion and sore  throat.   Eyes:  Negative for pain and discharge.  Respiratory:  Negative for cough and shortness of breath.   Cardiovascular:  Negative for chest pain and palpitations.  Gastrointestinal:  Negative for diarrhea, nausea and vomiting.  Endocrine: Negative for polydipsia and polyuria.  Genitourinary:  Negative for dysuria and hematuria.  Musculoskeletal:  Negative for neck pain and neck stiffness.  Skin:  Negative for rash.  Neurological:  Negative for dizziness, weakness, numbness and headaches.  Psychiatric/Behavioral:  Negative for agitation and behavioral problems.       Objective:    Physical Exam Vitals reviewed.  Constitutional:      General: He is not in acute distress.    Appearance: He is not diaphoretic.  HENT:     Head: Normocephalic and atraumatic.     Nose: Nose normal.     Mouth/Throat:     Mouth: Mucous membranes are moist.  Eyes:     General: No scleral icterus.    Extraocular Movements: Extraocular movements intact.  Cardiovascular:     Rate and Rhythm: Normal rate and regular rhythm.     Pulses: Normal pulses.     Heart sounds: Normal heart sounds. No murmur heard. Pulmonary:  Breath sounds: Normal breath sounds. No wheezing or rales.  Abdominal:     Palpations: Abdomen is soft.     Tenderness: There is no abdominal tenderness.  Musculoskeletal:     Cervical back: Neck supple. No tenderness.     Right lower leg: No edema.     Left lower leg: No edema.  Skin:    General: Skin is warm.     Findings: No rash.  Neurological:     General: No focal deficit present.     Mental Status: He is alert and oriented to person, place, and time.     Cranial Nerves: No cranial nerve deficit.     Sensory: No sensory deficit.     Motor: No weakness.  Psychiatric:        Mood and Affect: Mood normal.        Behavior: Behavior normal.     BP 127/72 (BP Location: Left Arm, Patient Position: Sitting, Cuff Size: Large)   Pulse 70   Ht 6' (1.829 m)   Wt 211 lb  3.2 oz (95.8 kg)   SpO2 93%   BMI 28.64 kg/m  Wt Readings from Last 3 Encounters:  08/20/22 211 lb 3.2 oz (95.8 kg)  07/02/22 214 lb 8 oz (97.3 kg)  04/17/22 216 lb 6.4 oz (98.2 kg)    Lab Results  Component Value Date   TSH 0.383 (L) 08/12/2022   Lab Results  Component Value Date   WBC 5.7 08/12/2022   HGB 14.8 08/12/2022   HCT 43.5 08/12/2022   MCV 99 (H) 08/12/2022   PLT 169 08/12/2022   Lab Results  Component Value Date   NA 142 08/12/2022   K 4.0 08/12/2022   CO2 23 08/12/2022   GLUCOSE 151 (H) 08/12/2022   BUN 14 08/12/2022   CREATININE 1.34 (H) 08/12/2022   BILITOT 0.6 08/12/2022   ALKPHOS 49 08/12/2022   AST 23 08/12/2022   ALT 19 08/12/2022   PROT 6.9 08/12/2022   ALBUMIN 4.0 08/12/2022   CALCIUM 10.2 08/12/2022   ANIONGAP 3 (L) 05/22/2021   EGFR 55 (L) 08/12/2022   Lab Results  Component Value Date   CHOL 159 08/12/2022   Lab Results  Component Value Date   HDL 36 (L) 08/12/2022   Lab Results  Component Value Date   LDLCALC 76 08/12/2022   Lab Results  Component Value Date   TRIG 287 (H) 08/12/2022   Lab Results  Component Value Date   CHOLHDL 4.4 08/12/2022   Lab Results  Component Value Date   HGBA1C 7.5 (H) 08/12/2022      Assessment & Plan:   Problem List Items Addressed This Visit       Cardiovascular and Mediastinum   Peripheral vascular disease ([place]) (Chronic)    Has had stent placed in LLE Follows up with interventional cardiology On aspirin, Plavix and Praluent      CAD S/P percutaneous coronary angioplasty (Chronic)    S/p stent placement Followed by Dr. Priscille Heidelberg On aspirin, Plavix and Praluent On beta-blocker, Imdur and Ranexa Denies any chest pain currently      DM (diabetes mellitus), type 2 with peripheral vascular complications (HCC) (Chronic)    Lab Results  Component Value Date   HGBA1C 7.5 (H) 08/12/2022  Uncontrolled On metformin 5-500 mg QD currently Added Rybelsus 3 mg QD, increase dose  as tolerated Had given Jardiance for additional cardiac benefit, but had rash and penile swelling, DC Jardiance Advised to follow diabetic diet  On statin and ACEi F/u BMP and HbA1C Diabetic eye exam: Advised to follow up with Ophthalmology for diabetic eye exam      Relevant Medications   Semaglutide (RYBELSUS) 3 MG TABS   Essential hypertension (Chronic)     Endocrine   Hypothyroidism    Was started on NP thyroid even though his TSH was wnl Checked TSH and free T4 - decreased dose of NP thyroid to 15 mg QD      Relevant Medications   thyroid (NP THYROID) 15 MG tablet     Other   Hyperlipidemia with target LDL less than 70 (Chronic)    Lipid profile reviewed from [place] chart On Praluent as he did not tolerate statin in the past      Encounter for general adult medical examination with abnormal findings - Primary    Physical exam as documented. Fasting blood tests reviewed. Gets vaccinations through [place].       Meds ordered this encounter  Medications   thyroid (NP THYROID) 15 MG tablet    Sig: Take 1 tablet (15 mg total) by mouth daily.    Dispense:  90 tablet    Refill:  1   Semaglutide (RYBELSUS) 3 MG TABS    Sig: Take 3 mg by mouth daily.    Dispense:  30 tablet    Refill:  0    Follow-up: Return in about 4 months (around 12/19/2022) for DM and HTN.    Lindell Spar, MD

## 2022-08-21 ENCOUNTER — Telehealth: Payer: Self-pay | Admitting: Internal Medicine

## 2022-08-21 NOTE — Telephone Encounter (Signed)
PA has already been submitted

## 2022-08-21 NOTE — Telephone Encounter (Signed)
Patient called this medicine needs prior authorization   Semaglutide [facility]) 3 MG TABS [[id number]]   Pharmacy: [facility]

## 2022-08-22 DIAGNOSIS — Z0001 Encounter for general adult medical examination with abnormal findings: Secondary | ICD-10-CM | POA: Insufficient documentation

## 2022-08-22 NOTE — Assessment & Plan Note (Signed)
Physical exam as documented. Fasting blood tests reviewed. Gets vaccinations through [place].

## 2022-08-22 NOTE — Assessment & Plan Note (Signed)
Was started on NP thyroid even though his TSH was wnl Checked TSH and free T4 - decreased dose of NP thyroid to 15 mg QD

## 2022-08-22 NOTE — Assessment & Plan Note (Signed)
Has had stent placed in LLE Follows up with interventional cardiology On aspirin, Plavix and Praluent

## 2022-08-22 NOTE — Assessment & Plan Note (Signed)
S/p stent placement Followed by Dr. Priscille Heidelberg On aspirin, Plavix and Praluent On beta-blocker, Imdur and Ranexa Denies any chest pain currently

## 2022-08-22 NOTE — Assessment & Plan Note (Signed)
Lipid profile reviewed from [place] chart On Praluent as he did not tolerate statin in the past

## 2022-09-10 ENCOUNTER — Ambulatory Visit (HOSPITAL_COMMUNITY)
Admission: RE | Admit: 2022-09-10 | Discharge: 2022-09-10 | Disposition: A | Discharge status: Home / Self Care | Payer: HMO | Source: Ambulatory Visit | Attending: Cardiology | Admitting: Cardiology

## 2022-09-10 ENCOUNTER — Telehealth: Payer: Self-pay | Admitting: Internal Medicine

## 2022-09-10 ENCOUNTER — Other Ambulatory Visit: Payer: Self-pay | Admitting: Internal Medicine

## 2022-09-10 DIAGNOSIS — I739 Peripheral vascular disease, unspecified: Secondary | ICD-10-CM | POA: Diagnosis not present

## 2022-09-10 DIAGNOSIS — E1151 Type 2 diabetes mellitus with diabetic peripheral angiopathy without gangrene: Secondary | ICD-10-CM

## 2022-09-10 MED ORDER — RYBELSUS 7 MG PO TABS: 7.0000 mg | ORAL_TABLET | Freq: Every day | ORAL | 3 refills | Status: DC

## 2022-09-10 NOTE — Telephone Encounter (Signed)
Pt called stating he has been taking Semaglutide (RYBELSUS) 3 MG TABS  for 3 wks. Seems medication is working. Does he need the dosage increased or what does he need to do?

## 2022-09-10 NOTE — Telephone Encounter (Signed)
Patient advised.

## 2022-09-11 LAB — VAS US ABI WITH/WO TBI
Left ABI: 1.12
Right ABI: 1.14

## 2022-09-15 ENCOUNTER — Ambulatory Visit (INDEPENDENT_AMBULATORY_CARE_PROVIDER_SITE_OTHER): Payer: PPO

## 2022-09-15 DIAGNOSIS — Z0000 Encounter for general adult medical examination without abnormal findings: Secondary | ICD-10-CM

## 2022-09-15 NOTE — Patient Instructions (Signed)
  [name] , Thank you for taking time to come for your Medicare Wellness Visit. I appreciate your ongoing commitment to your health goals. Please review the following plan we discussed and let me know if I can assist you in the future.   These are the goals we discussed:  Goals      Patient Stated     Stay healthy        This is a list of the screening recommended for you and due dates:  Health Maintenance  Topic Date Due   COVID-19 Vaccine (7 - 2023-24 season) 04/11/2022   Complete foot exam   09/11/2022   Eye exam for diabetics  10/24/2022   Yearly kidney health urinalysis for diabetes  12/10/2022   Hemoglobin A1C  02/10/2023   Yearly kidney function blood test for diabetes  08/13/2023   Medicare Annual Wellness Visit  09/16/2023   Colon Cancer Screening  09/07/2029   DTaP/Tdap/Td vaccine (3 - Td or Tdap) 06/20/2030   Pneumonia Vaccine  Completed   Flu Shot  Completed   Hepatitis C Screening: USPSTF Recommendation to screen - Ages 63-79 yo.  Completed   Zoster (Shingles) Vaccine  Completed   HPV Vaccine  Aged Out

## 2022-09-15 NOTE — Progress Notes (Signed)
Subjective:   [name] is a 77 y.o. male who presents for Medicare Annual/Subsequent preventive examination.  Review of Systems    I connected with  [name] on 09/15/22 by a audio enabled telemedicine application and verified that I am speaking with the correct person using two identifiers.  Patient Location: Home  Provider Location: Office/Clinic  I discussed the limitations of evaluation and management by telemedicine. The patient expressed understanding and agreed to proceed.        Objective:    There were no vitals filed for this visit. There is no height or weight on file to calculate BMI.     03/04/2022   11:19 PM 09/12/2021    8:42 AM 05/22/2021   10:55 AM 09/06/2019   11:18 AM 08/02/2019    7:33 AM 08/29/2017   12:48 PM 01/30/2017    7:15 AM  Advanced Directives  Does Patient Have a Medical Advance Directive? No No No No No No No  Would patient like information on creating a medical advance directive? No - Patient declined No - Patient declined  No - Patient declined No - Patient declined No - Patient declined No - Patient declined    Current Medications (verified) Outpatient Encounter Medications as of 09/15/2022  Medication Sig   Semaglutide (RYBELSUS) 7 MG TABS Take 1 tablet (7 mg total) by mouth daily.   acetaminophen (TYLENOL) 500 MG tablet Take 1,000 mg by mouth as needed for moderate pain.    Alirocumab (PRALUENT) 75 MG/ML SOAJ Inject 1 Dose into the skin every 14 (fourteen) days.   aspirin 81 MG chewable tablet Chew 1 tablet (81 mg total) by mouth daily.   Cholecalciferol (VITAMIN D3) 50 MCG (2000 UT) TABS Take 3 tablets by mouth daily.   clopidogrel (PLAVIX) 75 MG tablet TAKE ONE TABLET BY MOUTH ONCE DAILY.   cyclobenzaprine (FLEXERIL) 10 MG tablet Take 1 tablet by mouth as needed.   dextromethorphan-guaiFENesin (MUCINEX DM) 30-600 MG 12hr tablet Take 1 tablet by mouth as needed.  (Patient not taking: Reported on 08/19/2022)   diclofenac Sodium  (VOLTAREN) 1 % GEL Apply topically as needed.   docusate sodium (COLACE) 100 MG capsule Take 100 mg by mouth every other day.   fexofenadine (ALLEGRA) 180 MG tablet Take 180 mg by mouth as needed for allergies or rhinitis. Rotates with Claritin.   glipiZIDE-metformin (METAGLIP) 5-500 MG tablet TAKE (1) TABLET BY MOUTH ONCE DAILY AT 12 NOON.   isosorbide mononitrate (IMDUR) 60 MG 24 hr tablet Take 1 tablet (60 mg total) by mouth daily.   lisinopril (PRINIVIL,ZESTRIL) 5 MG tablet Take 5 mg by mouth daily.   metoprolol succinate (TOPROL-XL) 25 MG 24 hr tablet TAKE 2 TABLETS BY MOUTH ONCE DAILY.   nitroGLYCERIN (NITROSTAT) 0.4 MG SL tablet DISSOLVE 1 TABLET UNDER TONGUE EVERY 5 MINUTES UP TO 15 MIN FOR CHEST PAIN. IF NO RELIEF CALL 911.   omeprazole (PRILOSEC) 20 MG capsule Take 20 mg by mouth daily.   ONETOUCH ULTRA test strip USE AS DIRECTED UP TO 3 TIMES DAILY IF NEEDED.   OVER THE COUNTER MEDICATION Stool softner one daily   ranolazine (RANEXA) 500 MG 12 hr tablet Take 500 mg by mouth 2 (two) times daily.   sucralfate (CARAFATE) 1 g tablet Take 1 tablet (1 g total) by mouth 4 (four) times daily as needed.   sucralfate (CARAFATE) 1 GM/10ML suspension Take 10 mLs (1 g total) by mouth 4 (four) times daily.   thyroid (  NP THYROID) 15 MG tablet Take 1 tablet (15 mg total) by mouth daily.   No facility-administered encounter medications on file as of 09/15/2022.    Allergies (verified) Crestor [rosuvastatin], Jardiance [empagliflozin], Propoxyphene n-acetaminophen, Statins, Tape, Zetia [ezetimibe], Zocor [simvastatin], Flomax [tamsulosin hcl], Flomax [tamsulosin], Levaquin [levofloxacin hemihydrate], Levofloxacin, Penicillin g, and Penicillins   History: Past Medical History:  Diagnosis Date   CHF (congestive heart failure) ([place])    Coronary artery disease 08/28/2010   s/p multiple caths 2012, BMS PCI OM1 on August 28, 2010-during NSTEMI; January 2019 non-STEMI- occlusion of OM stent (very late  stent thrombosis), initial wire crossed with PTCA, but unable to rewire, PCI aborted--> plan medical therapy, normal EF   Diabetes mellitus    GERD (gastroesophageal reflux disease)    Hyperlipidemia    Hypertension    Non-STEMI (non-ST elevated myocardial infarction) [facility]) January 2012 and 2019   a) Jan 2012: 99% OM1 - BMS PCI; b) Jan 2019: Very late stent thrombosis/100% OM1 -after initially causing him for repeat PTCA restoring flow, unable to recross to place stent. - >  Medical therapy.   NSTEMI (non-ST elevated myocardial infarction) ([place])    Admitted 08/29/17 with NSTEMI-Troponin peak 8.8, normal LVF   PVD (peripheral vascular disease) ([facility])    left SFA PTA & stenting in 02/2005 (Dr. Adora Fridge)   Vitamin D deficiency disease 05/04/2019   Past Surgical History:  Procedure Laterality Date   BIOPSY  08/02/2019   Procedure: BIOPSY;  Surgeon: Daneil Dolin, MD;  [facility];  Service: Endoscopy;;  gastric    CARDIAC CATHETERIZATION  12/23/2004   normal L main, normal LAD, normal L Cfx, RCA with 20% hypodense lesion in first end of vessel (Dr. Adora Fridge)   [place]  08/26/2007   no significant CAD by cath, EF 50% (Dr. Jackie Plum)   COLONOSCOPY  12/2009   Dr. Hampton Abbot   COLONOSCOPY N/A 01/30/2017   pancolonic diverticulosis, non-bleeding internal hemorrhoids.   COLONOSCOPY WITH PROPOFOL N/A 09/08/2019   Procedure: COLONOSCOPY WITH PROPOFOL;  Surgeon: Daneil Dolin, MD;  [facility];  Service: Endoscopy;  Laterality: N/A;  11:15am   CORONARY BALLOON ANGIOPLASTY N/A 08/30/2017   Procedure: CORONARY BALLOON ANGIOPLASTY;  Surgeon: [name], MD;  Location: [place] [facility];  Service: Cardiovascular;  100% CTO very late stent thrombosis OM1 -> initially crossed with PTCA, but then unable to recross after losing my positioning.  PTCA ABORTED.  UNSUCCESSFUL ATTEMPT   CORONARY BALLOON ANGIOPLASTY  02/10/2011   95% prox in-stent restenosis within  OM stent - opened with cutting balloon (Dr. Corky Downs)   CORONARY STENT INTERVENTION  07/17/2011   in-stent restenosis - re-stented with Promus 2.25x63m DES (Dr. DRoni Bread   [place] 08/28/2010   NSTEMI: OM1 99% BMS PCI 2.0x178mMiniVision BMS (Dr. 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 J.Adora Fridge  LEFT HEART CATH AND CORONARY ANGIOGRAPHY N/A 08/30/2017   Procedure: LEFT HEART CATH AND CORONARY ANGIOGRAPHY;  Surgeon: BeLorretta HarpMD;  Location: [place] [facility];  Service: Cardiovascular;  100% very late stent thrombosis of Overlapped BMS-DES OM1 -> attempted PTCA   LEFT HEART CATH AND CORONARY ANGIOGRAPHY  08/28/2010   NSTEMI: OM1 99% BMS PCI  (Dr. Adora Fridge)   LEFT HEART CATH AND CORONARY ANGIOGRAPHY  09/11/2010   patent stent (Dr. Roni Bread)   [place] CATH AND CORONARY ANGIOGRAPHY  02/10/2011   95% prox in-stent restenosis within OM stent  (Dr. Corky Downs)   LEFT HEART CATHETERIZATION WITH CORONARY ANGIOGRAM N/A 07/17/2011   Procedure: LEFT HEART CATHETERIZATION WITH CORONARY ANGIOGRAM;  Surgeon: Leonie Man, MD;  Location: [facility] C[facility];  Service: Cardiovascular;  Laterality: N/A;  Right radial approach;  90% ISR of BMS (5 months post PTCA for ISR) --> DES PCI   left knee arthroscopy  05/2016   NM MYOCAR PERF WALL MOTION  09/08/2013   abnormal lexiscan - low to intermediate risk;    POLYPECTOMY  09/08/2019   Procedure: POLYPECTOMY;  Surgeon: Daneil Dolin, MD;   [facility];  Service: Endoscopy;;   TOOTH EXTRACTION Right 06/19/2020   TRANSTHORACIC ECHOCARDIOGRAM  09/01/2017   Normal LV size and function.  EF 66 5%.  Normal wall motion.  GR 1 DD.  Mild aortic sclerosis.  Aortic root mildly dilated at 40 mm.   Family History  Problem Relation Age of Onset   Arrhythmia Mother 18   Early death Son    Colon cancer Neg Hx    Liver disease Neg Hx    Inflammatory bowel disease Neg Hx    Colon polyps Neg Hx    Social History   Socioeconomic History   Marital status: Married    Spouse name: Mechele Claude   Number of children: 3   Years of education: 12   Highest education level: Some college, no degree  Occupational History   Occupation: Retired    Fish farm manager: [employer]  Tobacco Use   Smoking status: Former    Packs/day: 1.00    Years: 30.00    Total pack years: 30.00    Types: Cigarettes    Quit date: 05/12/1999    Years since quitting: 23.3    Passive exposure: Past   Smokeless tobacco: Never  Vaping Use   Vaping Use: Never used  Substance and Sexual Activity   Alcohol use: No   Drug use: No   Sexual activity: Yes  Other Topics Concern   Not on file  Social History Narrative   Married for 49 years.Lives with wife.Retired,ex-lab Merchant navy officer.Ex-Marine,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   Social Determinants of Health   Financial Resource Strain: Low Risk  (09/12/2021)   Overall Financial Resource Strain (CARDIA)    Difficulty of Paying Living Expenses: Not hard at all  Food Insecurity: No Food Insecurity (09/12/2021)   Hunger Vital Sign    Worried About Running Out of Food in the Last Year: Never true    Ran Out of Food in the Last Year: Never true  Transportation Needs: No Transportation Needs (09/12/2021)   PRAPARE - Hydrologist (Medical): No    Lack of Transportation (Non-Medical): No  Physical Activity: Sufficiently Active (09/12/2021)   Exercise Vital Sign    Days of Exercise per Week: 7 days     Minutes of Exercise per Session: 60 min  Stress: No Stress Concern Present (09/12/2021)   [place]    Feeling of Stress : Not  at all  Social Connections: Socially Integrated (09/12/2021)   Social Connection and Isolation Panel [NHANES]    Frequency of Communication with Friends and Family: More than three times a week    Frequency of Social Gatherings with Friends and Family: Three times a week    Attends Religious Services: More than 4 times per year    Active Member of Clubs or Organizations: Yes    Attends Archivist Meetings: More than 4 times per year    Marital Status: Married    Tobacco Counseling Counseling given: Not Answered   Clinical Intake:  Mr. Yin , Thank you for taking time to come for your Medicare Wellness Visit. I appreciate your ongoing commitment to your health goals. Please review the following plan we discussed and let me know if I can assist you in the future.   These are the goals we discussed:  Goals      Patient Stated     Stay healthy        This is a list of the screening recommended for you and due dates:  Health Maintenance  Topic Date Due   COVID-19 Vaccine (7 - 2023-24 season) 04/11/2022   Complete foot exam   09/11/2022   Eye exam for diabetics  10/24/2022   Yearly kidney health urinalysis for diabetes  12/10/2022   Hemoglobin A1C  02/10/2023   Yearly kidney function blood test for diabetes  08/13/2023   Medicare Annual Wellness Visit  09/16/2023   Colon Cancer Screening  09/07/2029   DTaP/Tdap/Td vaccine (3 - Td or Tdap) 06/20/2030   Pneumonia Vaccine  Completed   Flu Shot  Completed   Hepatitis C Screening: USPSTF Recommendation to screen - Ages 42-79 yo.  Completed   Zoster (Shingles) Vaccine  Completed   HPV Vaccine  Aged Out                   Diabetic?yes Nutrition Risk Assessment:  Has the patient had any N/V/D within the last 2 months?  No   Does the patient have any non-healing wounds?  No  Has the patient had any unintentional weight loss or weight gain?  No   Diabetes:  Is the patient diabetic?  Yes  If diabetic, was a CBG obtained today?  No  Did the patient bring in their glucometer from home?  No  How often do you monitor your CBG's? Daily/twice daily.   Financial Strains and Diabetes Management:  Are you having any financial strains with the device, your supplies or your medication? No .  Does the patient want to be seen by Chronic Care Management for management of their diabetes?  No  Would the patient like to be referred to a Nutritionist or for Diabetic Management?  No   Diabetic Exams:  Diabetic Eye Exam: Completed 10/23/21 Diabetic Foot Exam: Completed 09/11/21           Activities of Daily Living     No data to display           Patient Care Team: Lindell Spar, MD as PCP - General (Internal Medicine) [name], MD as PCP - Cardiology (Cardiology) Warden Fillers, MD as Consulting Physician (Ophthalmology) [facility]  Indicate any recent Medical Services you may have received from other than [facility] providers in the past year (date may be approximate).     Assessment:   This is a routine wellness examination for [place].  Hearing/Vision screen No results  found.  Dietary issues and exercise activities discussed:     Goals Addressed   None   Depression Screen    08/20/2022    2:57 PM 04/17/2022    3:01 PM 02/19/2022    3:49 PM 12/09/2021    3:02 PM 09/12/2021    8:40 AM 09/11/2021    1:59 PM 10/02/2020    9:59 AM  PHQ 2/9 Scores  PHQ - 2 Score 0 0 0 0 0 0 0  PHQ- 9 Score       0    Fall Risk    08/20/2022    2:56 PM 04/17/2022    3:01 PM 02/19/2022    3:49 PM 12/09/2021    3:02 PM 09/12/2021    8:42 AM  [place] in the past year? 0 0 0 0 0  Number falls in past yr: 0 0 0 0 0  Injury with Fall? 0 0 0 0 0  Risk for fall due to :  No Fall Risks  No Fall Risks No Fall Risks No Fall Risks  Follow up  Falls evaluation completed Falls evaluation completed Falls evaluation completed     FALL RISK PREVENTION PERTAINING TO THE HOME:  Any stairs in or around the home? Yes  If so, are there any without handrails? No  Home free of loose throw rugs in walkways, pet beds, electrical cords, etc? Yes  Adequate lighting in your home to reduce risk of falls? Yes   ASSISTIVE DEVICES UTILIZED TO PREVENT FALLS:  Life alert? No  Use of a cane, walker or w/c? No  Grab bars in the bathroom? No  Shower chair or bench in shower? Yes  Elevated toilet seat or a handicapped toilet? No           09/12/2021    8:45 AM  6CIT Screen  What Year? 0 points  What month? 0 points  What time? 0 points  Count back from 20 0 points  Months in reverse 0 points  Repeat phrase 0 points  Total Score 0 points    Immunizations Immunization History  Administered Date(s) Administered   Fluad Quad(high Dose 65+) 05/26/2019, 06/20/2020, 05/23/2021, 04/17/2022   Influenza Split 05/12/2015   Influenza, High Dose Seasonal PF 05/11/2017   Influenza,inj,Quad PF,6+ Mos 07/20/2018   Influenza-Unspecified 05/12/2019   Moderna Covid-19 Vaccine Bivalent Booster 85yr & up 06/13/2021   Moderna SARS-COV2 Booster Vaccination 11/21/2020   Moderna Sars-Covid-2 Vaccination 09/23/2019, 10/21/2019, 06/15/2020, 06/25/2020, 11/21/2020   Pneumococcal Conjugate-13 11/09/2015   Pneumococcal Polysaccharide-23 09/11/2021   Tdap 01/10/2012, 06/20/2020   Zoster Recombinat (Shingrix) 09/07/2020, 11/19/2020, 11/23/2020    TDAP status: Up to date  Flu Vaccine status: Up to date  Pneumococcal vaccine status: Up to date  Covid-19 vaccine status: Completed vaccines  Qualifies for Shingles Vaccine? Yes   Zostavax completed Yes   Shingrix Completed?: Yes  Screening Tests Health Maintenance  Topic Date Due   COVID-19 Vaccine (7 - 2023-24 season) 04/11/2022   FOOT EXAM   09/11/2022   OPHTHALMOLOGY EXAM  10/24/2022   Diabetic kidney evaluation - Urine ACR  12/10/2022   HEMOGLOBIN A1C  02/10/2023   Diabetic kidney evaluation - eGFR measurement  08/13/2023   Medicare Annual Wellness (AWV)  09/16/2023   COLONOSCOPY (Pts 45-433yrInsurance coverage will need to be confirmed)  09/07/2029   DTaP/Tdap/Td (3 - Td or Tdap) 06/20/2030   Pneumonia Vaccine 6564Years old  Completed   INFLUENZA VACCINE  Completed  Hepatitis C Screening  Completed   Zoster Vaccines- Shingrix  Completed   HPV VACCINES  Aged Out    Health Maintenance  Health Maintenance Due  Topic Date Due   COVID-19 Vaccine (7 - 2023-24 season) 04/11/2022   FOOT EXAM  09/11/2022    Colorectal cancer screening: Type of screening: Colonoscopy. Completed 09/08/19. Repeat every 10 years  Lung Cancer Screening: (Low Dose CT Chest recommended if Age 69-80 years, 30 pack-year currently smoking OR have quit w/in 15years.) does not qualify.   Lung Cancer Screening Referral: no  Additional Screening:  Hepatitis C Screening: does qualify; Completed 10/23/20  Vision Screening: Recommended annual ophthalmology exams for early detection of glaucoma and other disorders of the eye. Is the patient up to date with their annual eye exam?  Yes  Who is the provider or what is the name of the office in which the patient attends annual eye exams? Dr Katy Fitch If pt is not established with a provider, would they like to be referred to a provider to establish care? No .   Dental Screening: Recommended annual dental exams for proper oral hygiene  Community Resource Referral / Chronic Care Management: CRR required this visit?  No   CCM required this visit?  No      Plan:     I have personally reviewed and noted the following in the patient's chart:   Medical and social history Use of alcohol, tobacco or illicit drugs  Current medications and supplements including opioid prescriptions. Patient is not currently  taking opioid prescriptions. Functional ability and status Nutritional status Physical activity Advanced directives List of other physicians Hospitalizations, surgeries, and ER visits in previous 12 months Vitals Screenings to include cognitive, depression, and falls Referrals and appointments  In addition, I have reviewed and discussed with patient certain preventive protocols, quality metrics, and best practice recommendations. A written personalized care plan for preventive services as well as general preventive health recommendations were provided to patient.     Quentin Angst, [place]   09/15/2022

## 2022-09-22 ENCOUNTER — Other Ambulatory Visit: Payer: Self-pay | Admitting: Internal Medicine

## 2022-09-22 DIAGNOSIS — E1151 Type 2 diabetes mellitus with diabetic peripheral angiopathy without gangrene: Secondary | ICD-10-CM

## 2022-09-29 ENCOUNTER — Other Ambulatory Visit (HOSPITAL_COMMUNITY): Payer: Self-pay | Admitting: Cardiovascular Disease

## 2022-09-29 DIAGNOSIS — I739 Peripheral vascular disease, unspecified: Secondary | ICD-10-CM

## 2022-10-27 ENCOUNTER — Ambulatory Visit: Payer: PPO | Attending: Cardiovascular Disease | Admitting: Cardiovascular Disease

## 2022-10-27 ENCOUNTER — Encounter: Payer: Self-pay | Admitting: Cardiovascular Disease

## 2022-10-27 VITALS — BP 132/78 | HR 59 | Ht 72.0 in | Wt 210.6 lb

## 2022-10-27 DIAGNOSIS — I2510 Atherosclerotic heart disease of native coronary artery without angina pectoris: Secondary | ICD-10-CM | POA: Diagnosis not present

## 2022-10-27 DIAGNOSIS — I739 Peripheral vascular disease, unspecified: Secondary | ICD-10-CM

## 2022-10-27 DIAGNOSIS — I10 Essential (primary) hypertension: Secondary | ICD-10-CM

## 2022-10-27 DIAGNOSIS — Z9861 Coronary angioplasty status: Secondary | ICD-10-CM

## 2022-10-27 DIAGNOSIS — E785 Hyperlipidemia, unspecified: Secondary | ICD-10-CM | POA: Diagnosis not present

## 2022-10-27 DIAGNOSIS — Z955 Presence of coronary angioplasty implant and graft: Secondary | ICD-10-CM

## 2022-10-27 MED ORDER — NITROGLYCERIN 0.4 MG SL SUBL: SUBLINGUAL_TABLET | SUBLINGUAL | 4 refills | Status: DC

## 2022-10-27 NOTE — Assessment & Plan Note (Signed)
History of CAD status post stenting of his first OM branch with a bare-metal stent by myself 08/28/2010.  His most recent cath performed by myself 08/30/2017 revealed an occluded OM branch which I was unable to recanalize.  He did have a Myoview stress test performed 06/28/2020 that showed no ischemia.  He does get some reflux type symptoms that do not sound anginal.  He is on a long-acting oral nitrate.

## 2022-10-27 NOTE — Patient Instructions (Addendum)
Medication Instructions:  Your physician recommends that you continue on your current medications as directed. Please refer to the Current Medication list given to you today.  *If you need a refill on your cardiac medications before your next appointment, please call your pharmacy*   Testing/Procedures: Your physician has requested that you have a lower extremity arterial duplex. During this test, ultrasound is used to evaluate arterial blood flow in the legs. Allow one hour for this exam. There are no restrictions or special instructions. This will take place at [place], [address]. To be done in February 2025.   Your physician has requested that you have an ankle brachial index (ABI). During this test an ultrasound and blood pressure cuff are used to evaluate the arteries that supply the arms and legs with blood. Allow thirty minutes for this exam. There are no restrictions or special instructions. This will take place at [place], [address]. To be done in February 2025.    Follow-Up: At [facility], you and your health needs are our priority.  As part of our continuing mission to provide you with exceptional heart care, we have created designated Provider Care Teams.  These Care Teams include your primary Cardiologist (physician) and Advanced Practice Providers (APPs -  Physician Assistants and Nurse Practitioners) who all work together to provide you with the care you need, when you need it.  We recommend signing up for the patient portal called "MyChart".  Sign up information is provided on this After Visit Summary.  MyChart is used to connect with patients for Virtual Visits (Telemedicine).  Patients are able to view lab/test results, encounter notes, upcoming appointments, etc.  Non-urgent messages can be sent to your provider as well.   To learn more about what you can do with MyChart, go to [email or web address].    Your next appointment:   12  month(s)  Provider:   Quay Burow, MD

## 2022-10-27 NOTE — Assessment & Plan Note (Signed)
History of hyperlipidemia on Praluent with lipid profile performed 08/12/2022 revealing total cholesterol 159, LDL 76 and HDL 36.

## 2022-10-27 NOTE — Assessment & Plan Note (Signed)
History of essential hypertension blood pressure measured today 09/09/2018.  He is on lisinopril and metoprolol.

## 2022-10-27 NOTE — Progress Notes (Signed)
10/27/2022 [name]   1946/06/14  [id number]  Primary Physician Lindell Spar, MD Primary Cardiologist: [name] MD FACP, [place], [place], [place]  HPI:  [name] is a 77 y.o.  mildly overweight married African American male father of 3 who I last saw in the office 08/21/2020.Marland Kitchen  He did see Diona Browner, NP in the office 10/30/2021.  He has a history of PVOD status post left SFA, PTA and stenting by myself back in July of 2006 with subsequent improvement in his claudication and Dopplers. These were last done in April of last year and showed ABIs of greater than 1 bilaterally. His other problems include hypertension, hyperlipidemia, non-insulin-requiring diabetes and statin intolerance. I catheterized him August 28, 2010 and stented his first OM branch with a bare metal stent. He was readmitted September 09, 2010 with recurrent chest pain and was re-cathed by Dr. Ellyn Hack via the right radial approach revealing a widely patent stent. He was admitted again June 29th through July 3rd with chest pain and ruled out for myocardial infarction. His stress test showed subtle lateral ischemia and cath performed by Dr. Claiborne Billings February 10, 2012 revealed 95% proximal in-stent restenosis within the OM stent which Dr. Claiborne Billings opened up with a cutting balloon. He had done well until December of last year when he was admitted again with chest pain. Dr. Ellyn Hack recathed him revealing again in-stent restenosis which was "restented" with a Promus drug-eluting stent. He was having chest pain when I saw him back in December, however, since that time he has had minimal chest pain and he does walk 3-5 miles a day. His most recent liver profile performed 08/22/15 revealed an LDL of 94 and HDL 47 on Zocor 20 mg a day. He was recently admitted with chest pain/rule out MI 08/30/13. This is either negative. 2-D echo was essentially unremarkable with mild inferoseptal hypokinesia and a Myoview stress test was read as low risk. His  medications were adjusted. Ranexa was added..He has had no recurrent chest pain since I saw him a year ago until recently when he was taking his Ranexa once a day instead of twice a day. Since I saw him a year ago he denies claudication but does complain of new onset chest pain several weeks ago which is exertional Recatheterized him revealing a totally occluded first obtuse marginal branch stent which I was unable to open.  The remainder of his coronary anatomy was free of significant disease and his LV function was normal.  The decision was made to treat him medically at that time with Imdur and regular ranolazine.   Since I saw him in the office 2 years ago he did have a Myoview stress test performed 06/28/2020 that was low risk and nonischemic.  He has occasional atypical chest pain which sounds like reflux which improves with Protonix.  He also denies claudication.  His most recent Doppler studies performed 09/10/2022 revealed normal ABIs bilaterally with a high-frequency signal in his distal left common femoral artery.  The   Current Meds  Medication Sig   acetaminophen (TYLENOL) 500 MG tablet Take 1,000 mg by mouth as needed for moderate pain.    Alirocumab (PRALUENT) 75 MG/ML SOAJ Inject 1 Dose into the skin every 14 (fourteen) days.   aspirin 81 MG chewable tablet Chew 1 tablet (81 mg total) by mouth daily.   Cholecalciferol (VITAMIN D3) 50 MCG (2000 UT) TABS Take 3 tablets by mouth daily.   clopidogrel (PLAVIX)  75 MG tablet TAKE ONE TABLET BY MOUTH ONCE DAILY.   cyclobenzaprine (FLEXERIL) 10 MG tablet Take 1 tablet by mouth as needed.   dextromethorphan-guaiFENesin (MUCINEX DM) 30-600 MG 12hr tablet Take 1 tablet by mouth as needed.   diclofenac Sodium (VOLTAREN) 1 % GEL Apply topically as needed.   docusate sodium (COLACE) 100 MG capsule Take 100 mg by mouth every other day.   fexofenadine (ALLEGRA) 180 MG tablet Take 180 mg by mouth as needed for allergies or rhinitis. Rotates with  Claritin.   glipiZIDE-metformin (METAGLIP) 5-500 MG tablet TAKE (1) TABLET BY MOUTH ONCE DAILY AT 12 NOON.   isosorbide mononitrate (IMDUR) 60 MG 24 hr tablet Take 1 tablet (60 mg total) by mouth daily.   lisinopril (PRINIVIL,ZESTRIL) 5 MG tablet Take 5 mg by mouth daily.   metoprolol succinate (TOPROL-XL) 25 MG 24 hr tablet TAKE 2 TABLETS BY MOUTH ONCE DAILY.   nitroGLYCERIN (NITROSTAT) 0.4 MG SL tablet DISSOLVE 1 TABLET UNDER TONGUE EVERY 5 MINUTES UP TO 15 MIN FOR CHEST PAIN. IF NO RELIEF CALL 911.   omeprazole (PRILOSEC) 20 MG capsule Take 20 mg by mouth daily.   ONETOUCH ULTRA test strip USE AS DIRECTED UP TO 3 TIMES DAILY IF NEEDED.   OVER THE COUNTER MEDICATION Stool softner one daily   ranolazine (RANEXA) 500 MG 12 hr tablet Take 500 mg by mouth 2 (two) times daily.   Semaglutide (RYBELSUS) 7 MG TABS Take 1 tablet (7 mg total) by mouth daily.   sucralfate (CARAFATE) 1 g tablet Take 1 tablet (1 g total) by mouth 4 (four) times daily as needed.   sucralfate (CARAFATE) 1 GM/10ML suspension Take 10 mLs (1 g total) by mouth 4 (four) times daily.   thyroid (NP THYROID) 15 MG tablet Take 1 tablet (15 mg total) by mouth daily.     Allergies  Allergen Reactions   Crestor [Rosuvastatin]     myalgia   Jardiance [Empagliflozin]     Penile swelling and rash   Propoxyphene N-Acetaminophen Nausea Only   Statins Other (See Comments)    Severe muscle cramping/aching/pain/ elevated CK   Tape     Blisters   Zetia [Ezetimibe]     Muscle cramping   Zocor [Simvastatin]     myalgia   Flomax [Tamsulosin Hcl] Rash   Flomax [Tamsulosin] Rash   Levaquin [Levofloxacin Hemihydrate] Rash   Levofloxacin Rash   Penicillin G Diarrhea and Rash   Penicillins Rash    Broke out in rash 6 years ago, pt recently took penicillin (09/2016) and had no reaction Has patient had a PCN reaction causing immediate rash, facial/tongue/throat swelling, SOB or lightheadedness with hypotension: Yes Has patient had a PCN  reaction causing severe rash involving mucus membranes or skin necrosis: Unknown Has patient had a PCN reaction that required hospitalization: No Has patient had a PCN reaction occurring within the last 10 years: Yes If all of the above answers are "NO", then m    Social History   Socioeconomic History   Marital status: Married    Spouse name: Mechele Claude   Number of children: 3   Years of education: 12   Highest education level: Some college, no degree  Occupational History   Occupation: Retired    Fish farm manager: [employer]  Tobacco Use   Smoking status: Former    Packs/day: 1.00    Years: 30.00    Additional pack years: 0.00    Total pack years: 30.00    Types: Cigarettes  Quit date: 05/12/1999    Years since quitting: 23.4    Passive exposure: Past   Smokeless tobacco: Never  Vaping Use   Vaping Use: Never used  Substance and Sexual Activity   Alcohol use: No   Drug use: No   Sexual activity: Yes  Other Topics Concern   Not on file  Social History Narrative   Married for 49 years.Lives with wife.Retired,ex-lab Merchant navy officer.Ex-Marine,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   Social Determinants of Health   Financial Resource Strain: Low Risk  (09/15/2022)   Overall Financial Resource Strain (CARDIA)    Difficulty of Paying Living Expenses: Not hard at all  Food Insecurity: No Food Insecurity (09/15/2022)   Hunger Vital Sign    Worried About Running Out of Food in the Last Year: Never true    Ran Out of Food in the Last Year: Never true  Transportation Needs: No Transportation Needs (09/15/2022)   PRAPARE - Hydrologist (Medical): No    Lack of Transportation (Non-Medical): No  Physical Activity: Insufficiently Active (09/15/2022)   Exercise Vital Sign    Days of Exercise per Week: 3 days    Minutes of Exercise per Session: 30 min  Stress: No Stress Concern Present (09/15/2022)   [place]     Feeling of Stress : Not at all  Social Connections: Moderately Integrated (09/15/2022)   Social Connection and Isolation Panel [NHANES]    Frequency of Communication with Friends and Family: More than three times a week    Frequency of Social Gatherings with Friends and Family: More than three times a week    Attends Religious Services: More than 4 times per year    Active Member of [employer] or Organizations: No    Attends Archivist Meetings: Never    Marital Status: Married  Human resources officer Violence: Not At Risk (09/15/2022)   Humiliation, Afraid, Rape, and Kick questionnaire    Fear of Current or Ex-Partner: No    Emotionally Abused: No    Physically Abused: No    Sexually Abused: No     Review of Systems: General: negative for chills, fever, night sweats or weight changes.  Cardiovascular: negative for chest pain, dyspnea on exertion, edema, orthopnea, palpitations, paroxysmal nocturnal dyspnea or shortness of breath Dermatological: negative for rash Respiratory: negative for cough or wheezing Urologic: negative for hematuria Abdominal: negative for nausea, vomiting, diarrhea, bright red blood per rectum, melena, or hematemesis Neurologic: negative for visual changes, syncope, or dizziness All other systems reviewed and are otherwise negative except as noted above.    Blood pressure 132/78, pulse (!) 59, height 6' (1.829 m), weight 210 lb 9.6 oz (95.5 kg), SpO2 96 %.  General appearance: alert and no distress Neck: no adenopathy, no carotid bruit, no JVD, supple, symmetrical, trachea midline, and thyroid not enlarged, symmetric, no tenderness/mass/nodules Lungs: clear to auscultation bilaterally Heart: regular rate and rhythm, S1, S2 normal, no murmur, click, rub or gallop Extremities: extremities normal, atraumatic, no cyanosis or edema Pulses: 2+ and symmetric Skin: Skin color, texture, turgor normal. No rashes or lesions Neurologic: Grossly normal  EKG sinus  bradycardia 59 without ST or T wave changes.  Personally reviewed this EKG.  ASSESSMENT AND PLAN:   Peripheral vascular disease ([place]) History of PAD status post left SFA PTA and stenting by myself July 2006 with improvement in his claudication at that time.  He currently denies claudication.  His most recent Dopplers  performed 09/10/2022 revealed normal ABIs bilaterally with a high-frequency signal in his left distal common femoral artery.  Will continue to follow him noninvasively on annual basis.  CAD S/P percutaneous coronary angioplasty History of CAD status post stenting of his first OM branch with a bare-metal stent by myself 08/28/2010.  His most recent cath performed by myself 08/30/2017 revealed an occluded OM branch which I was unable to recanalize.  He did have a Myoview stress test performed 06/28/2020 that showed no ischemia.  He does get some reflux type symptoms that do not sound anginal.  He is on a long-acting oral nitrate.  Hyperlipidemia with target LDL less than 70 History of hyperlipidemia on Praluent with lipid profile performed 08/12/2022 revealing total cholesterol 159, LDL 76 and HDL 36.  Essential hypertension History of essential hypertension blood pressure measured today 09/09/2018.  He is on lisinopril and metoprolol.     [name] MD FACP,FACC,FAHA, [facility] 10/27/2022 3:42 PM

## 2022-10-27 NOTE — Assessment & Plan Note (Signed)
History of PAD status post left SFA PTA and stenting by myself July 2006 with improvement in his claudication at that time.  He currently denies claudication.  His most recent Dopplers performed 09/10/2022 revealed normal ABIs bilaterally with a high-frequency signal in his left distal common femoral artery.  Will continue to follow him noninvasively on annual basis.

## 2022-10-29 LAB — HM DIABETES EYE EXAM

## 2022-11-15 ENCOUNTER — Ambulatory Visit
Admission: EM | Admit: 2022-11-15 | Discharge: 2022-11-15 | Disposition: A | Discharge status: Home / Self Care | Payer: PPO | Attending: Physician Assistant | Admitting: Physician Assistant

## 2022-11-15 DIAGNOSIS — T148XXA Other injury of unspecified body region, initial encounter: Secondary | ICD-10-CM

## 2022-11-15 LAB — POCT URINALYSIS DIP (MANUAL ENTRY)
Bilirubin, UA: NEGATIVE
Blood, UA: NEGATIVE
Glucose, UA: NEGATIVE mg/dL
Ketones, POC UA: NEGATIVE mg/dL
Leukocytes, UA: NEGATIVE
Nitrite, UA: NEGATIVE
Protein Ur, POC: NEGATIVE mg/dL
Spec Grav, UA: 1.025 (ref 1.010–1.025)
Urobilinogen, UA: 1 E.U./dL
pH, UA: 6 (ref 5.0–8.0)

## 2022-11-15 NOTE — ED Triage Notes (Signed)
Pt reports he has been having pain in his mid to lower back and lower right side abdomen x 2 weeks . He believes he pulled something in his back when fixing up his attic. Pain increases with movement. However pt is also having frequent urination and urgency.

## 2022-11-15 NOTE — Discharge Instructions (Signed)
Continue with Ibuprofen or Tylenol as needed Recommend gentle stretching If no improvement follow up with Primary Care Physician

## 2022-11-15 NOTE — ED Provider Notes (Signed)
[facility]    CSN: [id number] Arrival date & time: 11/15/22  0846      History   Chief Complaint Chief Complaint  Patient presents with   uti    HPI [name] is a 77 y.o. male.   Complains of right lower back pain and right-sided abdominal pain that started about 2 weeks ago.  He reports the pain started after he was in the attic doing work.  Patient reports he initially thought it was just musculoskeletal pain but the abdominal pain made him concerned.  He reports pain is only in the abdomen when he sits up from bed in the mornings.  He denies change in bowel habits, dysuria, fever, chills, nausea, vomiting.  He denies radiation of lower back pain, numbness, tingling, saddle anesthesia.  He reports similar symptoms in the past and has used Flexeril with some relief.  He still has a prescription for Flexeril but has not tried this.  He does report some increased urinary frequency.     Past Medical History:  Diagnosis Date   CHF (congestive heart failure)    Coronary artery disease 08/28/2010   s/p multiple caths 2012, BMS PCI OM1 on August 28, 2010-during NSTEMI; January 2019 non-STEMI- occlusion of OM stent (very late stent thrombosis), initial wire crossed with PTCA, but unable to rewire, PCI aborted--> plan medical therapy, normal EF   Diabetes mellitus    GERD (gastroesophageal reflux disease)    Hyperlipidemia    Hypertension    Non-STEMI (non-ST elevated myocardial infarction) January 2012 and 2019   a) Jan 2012: 99% OM1 - BMS PCI; b) Jan 2019: Very late stent thrombosis/100% OM1 -after initially causing him for repeat PTCA restoring flow, unable to recross to place stent. - >  Medical therapy.   NSTEMI (non-ST elevated myocardial infarction)    Admitted 08/29/17 with NSTEMI-Troponin peak 8.8, normal LVF   PVD (peripheral vascular disease)    left SFA PTA & stenting in 02/2005 (Dr. 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   Constipation 08/24/2019   IDA (iron deficiency anemia) 05/26/2019   Vitamin D deficiency disease 05/04/2019   GERD (gastroesophageal reflux disease) 01/09/2017   Hepatomegaly 11/08/2014   Essential hypertension 09/07/2013   CAD S/P percutaneous coronary angioplasty 07/16/2011   DM (diabetes mellitus), 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 Erlene Quan)   CARDIAC CATHETERIZATION  08/26/2007   no significant CAD by cath, EF 50% (Dr. Evlyn Courier)   COLONOSCOPY  12/2009   Dr. Louie Casa   COLONOSCOPY N/A 01/30/2017   pancolonic diverticulosis, non-bleeding internal hemorrhoids.   COLONOSCOPY WITH PROPOFOL N/A 09/08/2019   Procedure: COLONOSCOPY WITH PROPOFOL;  Surgeon: Corbin Ade, MD;  Location: [facility];  Service: Endoscopy;  Laterality: N/A;  11:15am   CORONARY BALLOON ANGIOPLASTY N/A 08/30/2017   Procedure: CORONARY BALLOON ANGIOPLASTY;  Surgeon: Runell Gess, MD;  Location: [facility];  Service: Cardiovascular;  100% CTO very late stent thrombosis OM1 -> initially crossed with PTCA, but then unable to recross after losing my positioning.  PTCA ABORTED.  UNSUCCESSFUL ATTEMPT   CORONARY BALLOON ANGIOPLASTY  02/10/2011   95% prox in-stent restenosis within OM stent  - opened with cutting balloon (Dr. Bishop Limbo)   CORONARY STENT INTERVENTION  07/17/2011   in-stent restenosis - re-stented with Promus 2.25x42mm DES (Dr. Ranae Palms)   CORONARY STENT INTERVENTION  08/28/2010   NSTEMI: OM1 99% BMS PCI 2.0x71mm MiniVision BMS (Dr. 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 Erlene Quan)   LEFT HEART CATH AND CORONARY ANGIOGRAPHY N/A 08/30/2017   Procedure: LEFT HEART CATH AND CORONARY ANGIOGRAPHY;  Surgeon: Runell Gess, MD;  Location: [facility];  Service: Cardiovascular;  100% very late stent thrombosis of Overlapped BMS-DES OM1 -> attempted PTCA   LEFT HEART CATH AND CORONARY ANGIOGRAPHY  08/28/2010   NSTEMI: OM1 99% BMS PCI  (Dr. Erlene Quan)   LEFT HEART CATH AND CORONARY ANGIOGRAPHY  09/11/2010   patent stent (Dr. Ranae Palms)   LEFT HEART CATH AND CORONARY ANGIOGRAPHY  02/10/2011   95% prox in-stent restenosis within OM stent  (Dr. Bishop Limbo)   LEFT HEART CATHETERIZATION WITH CORONARY ANGIOGRAM N/A 07/17/2011   Procedure: LEFT HEART CATHETERIZATION WITH CORONARY ANGIOGRAM;  Surgeon: Marykay Lex, MD;  Location: [facility];  Service: Cardiovascular;  Laterality: N/A;  Right radial approach;  90% ISR of BMS (5 months post PTCA for ISR) --> DES PCI   left knee arthroscopy  05/2016   NM MYOCAR PERF WALL MOTION  09/08/2013   abnormal lexiscan - low to intermediate risk;    POLYPECTOMY  09/08/2019   Procedure: POLYPECTOMY;  Surgeon: Corbin Ade, MD;  Location:  [facility];  Service: Endoscopy;;   TOOTH EXTRACTION Right 06/19/2020   TRANSTHORACIC ECHOCARDIOGRAM  09/01/2017   Normal LV size and function.  EF 66 5%.  Normal wall motion.  GR 1 DD.  Mild aortic sclerosis.  Aortic root mildly dilated at 40 mm.       Home Medications    Prior to Admission medications   Medication Sig Start Date End Date Taking? Authorizing Provider  acetaminophen (TYLENOL) 500 MG tablet Take 1,000 mg by mouth as needed for moderate pain.     [provider]  Alirocumab (PRALUENT) 75 MG/ML SOAJ Inject 1 Dose into the skin every 14 (fourteen) days.    [provider]  aspirin 81 MG chewable tablet Chew 1 tablet (81 mg total) by mouth daily. 09/03/17   Arty Baumgartner, NP  Cholecalciferol (VITAMIN D3) 50 MCG (2000 UT) TABS Take 3 tablets by mouth daily.    [provider]  clopidogrel (PLAVIX) 75 MG tablet TAKE ONE TABLET BY MOUTH ONCE DAILY. 12/07/15   Runell Gess, MD  cyclobenzaprine Lottie Dawson)  10 MG tablet Take 1 tablet by mouth as needed. 09/07/20   [provider]  dextromethorphan-guaiFENesin (MUCINEX DM) 30-600 MG 12hr tablet Take 1 tablet by mouth as needed.    [provider]  diclofenac Sodium (VOLTAREN) 1 % GEL Apply topically as needed. 09/07/20   [provider]  docusate sodium (COLACE) 100 MG capsule Take 100 mg by mouth every other day.    [provider]  fexofenadine (ALLEGRA) 180 MG tablet Take 180 mg by mouth as needed for allergies or rhinitis. Rotates with Claritin.    [provider]  glipiZIDE-metformin (METAGLIP) 5-500 MG tablet TAKE (1) TABLET BY MOUTH ONCE DAILY AT 12 NOON. 09/22/22   Anabel Halon, MD  isosorbide mononitrate (IMDUR) 60 MG 24 hr tablet Take 1 tablet (60 mg total) by mouth daily. 06/21/19   Runell Gess, MD  lisinopril (PRINIVIL,ZESTRIL) 5 MG tablet Take 5 mg by mouth daily.    [provider]  metoprolol succinate (TOPROL-XL) 25 MG 24 hr  tablet TAKE 2 TABLETS BY MOUTH ONCE DAILY. 11/26/21   Runell Gess, MD  nitroGLYCERIN (NITROSTAT) 0.4 MG SL tablet DISSOLVE 1 TABLET UNDER TONGUE EVERY 5 MINUTES UP TO 15 MIN FOR CHEST PAIN. IF NO RELIEF CALL 911. 10/27/22   Runell Gess, MD  omeprazole (PRILOSEC) 20 MG capsule Take 20 mg by mouth daily.    [provider]  ONETOUCH ULTRA test strip USE AS DIRECTED UP TO 3 TIMES DAILY IF NEEDED. 03/28/20   Wilson Singer, MD  OVER THE COUNTER MEDICATION Stool softner one daily    [provider]  ranolazine (RANEXA) 500 MG 12 hr tablet Take 500 mg by mouth 2 (two) times daily.    [provider]  Semaglutide (RYBELSUS) 7 MG TABS Take 1 tablet (7 mg total) by mouth daily. 09/10/22   Anabel Halon, MD  sucralfate (CARAFATE) 1 g tablet Take 1 tablet (1 g total) by mouth 4 (four) times daily as needed. 07/12/20   Anice Paganini, NP  sucralfate (CARAFATE) 1 GM/10ML suspension Take 10 mLs (1 g total) by mouth 4 (four) times daily. 07/02/22   Gelene Mink, NP  thyroid (NP THYROID) 15 MG tablet Take 1 tablet (15 mg total) by mouth daily. 08/20/22   Anabel Halon, MD    Family History Family History  Problem Relation Age of Onset   Arrhythmia Mother 71   Early death Son    Colon cancer Neg Hx    Liver disease Neg Hx    Inflammatory bowel disease Neg Hx    Colon polyps Neg Hx     Social History Social History   Tobacco Use   Smoking status: Former    Packs/day: 1.00    Years: 30.00    Additional pack years: 0.00    Total pack years: 30.00    Types: Cigarettes    Quit date: 05/12/1999    Years since quitting: 23.5    Passive exposure: Past   Smokeless tobacco: Never  Vaping Use   Vaping Use: Never used  Substance Use Topics   Alcohol use: No   Drug use: No     Allergies   Crestor [rosuvastatin], Jardiance [empagliflozin], Propoxyphene n-acetaminophen, Statins, Tape, Zetia [ezetimibe], Zocor [simvastatin], Flomax [tamsulosin hcl], Flomax  [tamsulosin], Levaquin [levofloxacin hemihydrate], Levofloxacin, Penicillin g, and Penicillins   Review of Systems Review of Systems  Constitutional:  Negative for chills and fever.  HENT:  Negative for ear  pain and sore throat.   Eyes:  Negative for pain and visual disturbance.  Respiratory:  Negative for cough and shortness of breath.   Cardiovascular:  Negative for chest pain and palpitations.  Gastrointestinal:  Positive for abdominal pain. Negative for vomiting.  Genitourinary:  Negative for dysuria and hematuria.  Musculoskeletal:  Positive for back pain. Negative for arthralgias.  Skin:  Negative for color change and rash.  Neurological:  Negative for seizures and syncope.  All other systems reviewed and are negative.    Physical Exam Triage Vital Signs ED Triage Vitals  Enc Vitals Group     BP 11/15/22 0857 123/70     Pulse Rate 11/15/22 0857 64     Resp 11/15/22 0857 20     Temp 11/15/22 0857 97.8 F (36.6 C)     Temp Source 11/15/22 0857 Oral     SpO2 11/15/22 0857 98 %     Weight --      Height --      Head Circumference --      Peak Flow --      Pain Score 11/15/22 0859 6     Pain Loc --      Pain Edu? --      Excl. in GC? --    No data found.  Updated Vital Signs BP 123/70 (BP Location: Right Arm)   Pulse 64   Temp 97.8 F (36.6 C) (Oral)   Resp 20   SpO2 98%   Visual Acuity Right Eye Distance:   Left Eye Distance:   Bilateral Distance:    Right Eye Near:   Left Eye Near:    Bilateral Near:     Physical Exam Vitals and nursing note reviewed.  Constitutional:      General: He is not in acute distress.    Appearance: He is well-developed.  HENT:     Head: Normocephalic and atraumatic.  Eyes:     Conjunctiva/sclera: Conjunctivae normal.  Cardiovascular:     Rate and Rhythm: Normal rate and regular rhythm.     Heart sounds: No murmur heard. Pulmonary:     Effort: Pulmonary effort is normal. No respiratory distress.     Breath sounds:  Normal breath sounds.  Abdominal:     Palpations: Abdomen is soft.     Tenderness: There is no abdominal tenderness.     Comments: Normal abdominal exam.  Musculoskeletal:        General: No swelling.     Cervical back: Neck supple.     Comments: Mild tenderness to right lumbar paraspinal musculature with spasm.  Negative straight leg raise.   Skin:    General: Skin is warm and dry.     Capillary Refill: Capillary refill takes less than 2 seconds.  Neurological:     Mental Status: He is alert.  Psychiatric:        Mood and Affect: Mood normal.      UC Treatments / Results  Labs (all labs ordered are listed, but only abnormal results are displayed) Labs Reviewed  POCT URINALYSIS DIP (MANUAL ENTRY)    EKG   Radiology No results found.  Procedures Procedures (including critical care time)  Medications Ordered in UC Medications - No data to display  Initial Impression / Assessment and Plan / UC Course  I have reviewed the triage vital signs and the nursing notes.  Pertinent labs & imaging results that were available during my care of the patient were reviewed by me  and considered in my medical decision making (see chart for details).     Lower back muscle strain without radicular symptoms.  Supportive care discussed. Abdominal pain also appears to be musculoskeletal in nature.  Pain is only present when sitting up from bed.  Patient describes it as right's mid abdomen pain.  Normal abdominal exam.  No other concerning symptoms at this time.  UA normal.  Return precautions discussed. Final Clinical Impressions(s) / UC Diagnoses   Final diagnoses:  Muscle strain     Discharge Instructions      Continue with Ibuprofen or Tylenol as needed Recommend gentle stretching If no improvement follow up with Primary Care Physician   ED Prescriptions   None    PDMP not reviewed this encounter.   Ward, Tylene Fantasia, PA-C 11/15/22 [phone]

## 2022-11-17 ENCOUNTER — Telehealth: Payer: Self-pay | Admitting: Internal Medicine

## 2022-11-17 NOTE — Telephone Encounter (Signed)
scheduled

## 2022-11-17 NOTE — Telephone Encounter (Signed)
Patient called had a reaction to this medication, Semaglutide (RYBELSUS) 7 MG TABS [[id number]]  he stopped this medication 2 days ago, but still having back pain. What does patient need to do next?

## 2022-11-18 ENCOUNTER — Ambulatory Visit (HOSPITAL_COMMUNITY)
Admission: RE | Admit: 2022-11-18 | Discharge: 2022-11-18 | Disposition: A | Discharge status: Home / Self Care | Payer: PPO | Source: Ambulatory Visit | Attending: Internal Medicine | Admitting: Internal Medicine

## 2022-11-18 ENCOUNTER — Ambulatory Visit (INDEPENDENT_AMBULATORY_CARE_PROVIDER_SITE_OTHER): Payer: PPO | Admitting: Internal Medicine

## 2022-11-18 ENCOUNTER — Encounter: Payer: Self-pay | Admitting: Internal Medicine

## 2022-11-18 VITALS — BP 128/67 | HR 65 | Ht 72.0 in | Wt 211.6 lb

## 2022-11-18 DIAGNOSIS — E1151 Type 2 diabetes mellitus with diabetic peripheral angiopathy without gangrene: Secondary | ICD-10-CM | POA: Diagnosis not present

## 2022-11-18 DIAGNOSIS — I10 Essential (primary) hypertension: Secondary | ICD-10-CM | POA: Diagnosis not present

## 2022-11-18 DIAGNOSIS — M545 Low back pain, unspecified: Secondary | ICD-10-CM | POA: Insufficient documentation

## 2022-11-18 DIAGNOSIS — E039 Hypothyroidism, unspecified: Secondary | ICD-10-CM | POA: Diagnosis not present

## 2022-11-18 MED ORDER — PREDNISONE 20 MG PO TABS: 40.0000 mg | ORAL_TABLET | Freq: Every day | ORAL | 0 refills | Status: DC

## 2022-11-18 NOTE — Progress Notes (Signed)
Established Patient Office Visit  Subjective:  Patient ID: [name], male    DOB: 03/22/1946  Age: 77 y.o. MRN: [id number]  CC:  Chief Complaint  Patient presents with   Abdominal Pain    Patient stated about a week ago he started having back and abdominal pain, has gotten worse over the past couple of days.    HPI [name] is a 77 y.o. male with past medical history of HTN, CAD, PAD, type II DM, GERD, HLD and ?Hypothyroidism who presents for f/u of his chronic medical conditions and recent acute low back pain.  He complains of bilateral low back pain for the last 1 week.  He has lumbar area low back pain, constant, worse upon bending and trying to stand up and better with ibuprofen.  He went to urgent care and was told to take ibuprofen and Flexeril as needed.  Denies any recent injury or fall.  He exercises regularly and lifts weights.  He had been having radiating pain towards flank area and he thought that it was due to Rybelsus and stopped taking it.  He has not seen any difference in his symptoms since stopping Rybelsus.  CAD, PAD and HTN: He has had cardiac stents and stent in LLE.  He follows up with Dr. Allyson Sabal for it.  He takes aspirin, Plavix and Praluent for it.  He takes lisinopril and metoprolol as well.  He denies any chest pain, dyspnea or palpitations currently.  He is on Imdur and Ranexa for angina.  Type II DM: He is on Metaglip 5-500 mg QD and Rybelsus currently.  His last HbA1c was 6.4 at [facility] clinic.  He used to take Metaglip BID in the past, which caused episodes of hypoglycemia.  His blood glucose ranges around 120 most of the time now.  He denies any polyuria or polydipsia currently.   He was started on NP thyroid even though his TSH was normal by his previous PCP.  I decreased his dose of NP thyroid recently.  He denies any fatigue, recent change in weight or appetite.  Denies any tremors or palpitations currently. His last TSH was wnl at [facility] clinic.  Blood  test from [facility] clinic were reviewed and discussed with the patient in detail.  Past Medical History:  Diagnosis Date   CHF (congestive heart failure)    Coronary artery disease 08/28/2010   s/p multiple caths 2012, BMS PCI OM1 on August 28, 2010-during NSTEMI; January 2019 non-STEMI- occlusion of OM stent (very late stent thrombosis), initial wire crossed with PTCA, but unable to rewire, PCI aborted--> plan medical therapy, normal EF   Diabetes mellitus    GERD (gastroesophageal reflux disease)    Hyperlipidemia    Hypertension    Non-STEMI (non-ST elevated myocardial infarction) January 2012 and 2019   a) Jan 2012: 99% OM1 - BMS PCI; b) Jan 2019: Very late stent thrombosis/100% OM1 -after initially causing him for repeat PTCA restoring flow, unable to recross to place stent. - >  Medical therapy.   NSTEMI (non-ST elevated myocardial infarction)    Admitted 08/29/17 with NSTEMI-Troponin peak 8.8, normal LVF   PVD (peripheral vascular disease)    left SFA PTA & stenting in 02/2005 (Dr. Erlene Quan)   Vitamin D deficiency disease 05/04/2019    Past Surgical History:  Procedure Laterality Date   BIOPSY  08/02/2019   Procedure: BIOPSY;  Surgeon: Corbin Ade, MD;  Location: [facility];  Service: Endoscopy;;  gastric  CARDIAC CATHETERIZATION  12/23/2004   normal L main, normal LAD, normal L Cfx, RCA with 20% hypodense lesion in first end of vessel (Dr. Erlene Quan)   CARDIAC CATHETERIZATION  08/26/2007   no significant CAD by cath, EF 50% (Dr. Evlyn Courier)   COLONOSCOPY  12/2009   Dr. Louie Casa   COLONOSCOPY N/A 01/30/2017   pancolonic diverticulosis, non-bleeding internal hemorrhoids.   COLONOSCOPY WITH PROPOFOL N/A 09/08/2019   Procedure: COLONOSCOPY WITH PROPOFOL;  Surgeon: Corbin Ade, MD;  Location: [facility];  Service: Endoscopy;  Laterality: N/A;  11:15am   CORONARY BALLOON ANGIOPLASTY N/A 08/30/2017   Procedure: CORONARY BALLOON ANGIOPLASTY;  Surgeon: Runell Gess, MD;   Location: [facility];  Service: Cardiovascular;  100% CTO very late stent thrombosis OM1 -> initially crossed with PTCA, but then unable to recross after losing my positioning.  PTCA ABORTED.  UNSUCCESSFUL ATTEMPT   CORONARY BALLOON ANGIOPLASTY  02/10/2011   95% prox in-stent restenosis within OM stent - opened with cutting balloon (Dr. Bishop Limbo)   CORONARY STENT INTERVENTION  07/17/2011   in-stent restenosis - re-stented with Promus 2.25x27mm DES (Dr. Ranae Palms)   CORONARY STENT INTERVENTION  08/28/2010   NSTEMI: OM1 99% BMS PCI 2.0x76mm MiniVision BMS (Dr. 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 Erlene Quan)   LEFT HEART CATH AND CORONARY ANGIOGRAPHY N/A 08/30/2017   Procedure: LEFT HEART CATH AND CORONARY ANGIOGRAPHY;  Surgeon: Runell Gess, MD;  Location: [facility];  Service: Cardiovascular;  100% very late stent thrombosis of Overlapped BMS-DES OM1 -> attempted PTCA   LEFT HEART CATH AND CORONARY ANGIOGRAPHY  08/28/2010   NSTEMI: OM1 99% BMS PCI  (Dr. Erlene Quan)   LEFT HEART CATH AND CORONARY ANGIOGRAPHY  09/11/2010   patent stent (Dr. Ranae Palms)   LEFT HEART CATH AND CORONARY ANGIOGRAPHY  02/10/2011   95% prox in-stent restenosis within OM stent  (Dr. Bishop Limbo)   LEFT HEART CATHETERIZATION WITH CORONARY ANGIOGRAM N/A 07/17/2011   Procedure: LEFT HEART CATHETERIZATION WITH CORONARY ANGIOGRAM;  Surgeon: Marykay Lex, MD;  Location: [facility] CATH LAB;  Service: Cardiovascular;   Laterality: N/A;  Right radial approach;  90% ISR of BMS (5 months post PTCA for ISR) --> DES PCI   left knee arthroscopy  05/2016   NM MYOCAR PERF WALL MOTION  09/08/2013   abnormal lexiscan - low to intermediate risk;    POLYPECTOMY  09/08/2019   Procedure: POLYPECTOMY;  Surgeon: Corbin Ade, MD;  Location: [facility];  Service: Endoscopy;;   TOOTH EXTRACTION Right 06/19/2020   TRANSTHORACIC ECHOCARDIOGRAM  09/01/2017   Normal LV size and function.  EF 66 5%.  Normal wall motion.  GR 1 DD.  Mild aortic sclerosis.  Aortic root mildly dilated at 40 mm.    Family History  Problem Relation Age of Onset   Arrhythmia Mother 39   Early death Son    Colon cancer Neg Hx    Liver disease Neg Hx    Inflammatory bowel disease Neg Hx  Colon polyps Neg Hx     Social History   Socioeconomic History   Marital status: Married    Spouse name: [name]   Number of children: 3   Years of education: 12   Highest education level: Some college, no degree  Occupational History   Occupation: Retired    Associate Professor: [employer]  Tobacco Use   Smoking status: Former    Packs/day: 1.00    Years: 30.00    Additional pack years: 0.00    Total pack years: 30.00    Types: Cigarettes    Quit date: 05/12/1999    Years since quitting: 23.5    Passive exposure: Past   Smokeless tobacco: Never  Vaping Use   Vaping Use: Never used  Substance and Sexual Activity   Alcohol use: No   Drug use: No   Sexual activity: Yes  Other Topics Concern   Not on file  Social History Narrative   Married for 49 years.Lives with wife.Retired,ex-lab Pensions consultant.Ex-Marine,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   Social Determinants of Health   Financial Resource Strain: Low Risk  (09/15/2022)   Overall Financial Resource Strain (CARDIA)    Difficulty of Paying Living Expenses: Not hard at all  Food Insecurity: No Food Insecurity (09/15/2022)   Hunger Vital Sign    Worried About Running Out of Food in the Last Year: Never true     Ran Out of Food in the Last Year: Never true  Transportation Needs: No Transportation Needs (09/15/2022)   PRAPARE - Administrator, Civil Service (Medical): No    Lack of Transportation (Non-Medical): No  Physical Activity: Insufficiently Active (09/15/2022)   Exercise Vital Sign    Days of Exercise per Week: 3 days    Minutes of Exercise per Session: 30 min  Stress: No Stress Concern Present (09/15/2022)   [employer] of Occupational Health - Occupational Stress Questionnaire    Feeling of Stress : Not at all  Social Connections: Moderately Integrated (09/15/2022)   Social Connection and Isolation Panel [NHANES]    Frequency of Communication with Friends and Family: More than three times a week    Frequency of Social Gatherings with Friends and Family: More than three times a week    Attends Religious Services: More than 4 times per year    Active Member of [employer] or Organizations: No    Attends Banker Meetings: Never    Marital Status: Married  Catering manager Violence: Not At Risk (09/15/2022)   Humiliation, Afraid, Rape, and Kick questionnaire    Fear of Current or Ex-Partner: No    Emotionally Abused: No    Physically Abused: No    Sexually Abused: No    Outpatient Medications Prior to Visit  Medication Sig Dispense Refill   acetaminophen (TYLENOL) 500 MG tablet Take 1,000 mg by mouth as needed for moderate pain.      Alirocumab (PRALUENT) 75 MG/ML SOAJ Inject 1 Dose into the skin every 14 (fourteen) days.     aspirin 81 MG chewable tablet Chew 1 tablet (81 mg total) by mouth daily.     Cholecalciferol (VITAMIN D3) 50 MCG (2000 UT) TABS Take 3 tablets by mouth daily.     clopidogrel (PLAVIX) 75 MG tablet TAKE ONE TABLET BY MOUTH ONCE DAILY. 30 tablet 9   cyclobenzaprine (FLEXERIL) 10 MG tablet Take 1 tablet by mouth as needed.     dextromethorphan-guaiFENesin (MUCINEX DM) 30-600 MG 12hr tablet Take 1 tablet by mouth as needed.  diclofenac Sodium  (VOLTAREN) 1 % GEL Apply topically as needed.     docusate sodium (COLACE) 100 MG capsule Take 100 mg by mouth every other day.     fexofenadine (ALLEGRA) 180 MG tablet Take 180 mg by mouth as needed for allergies or rhinitis. Rotates with Claritin.     glipiZIDE-metformin (METAGLIP) 5-500 MG tablet TAKE (1) TABLET BY MOUTH ONCE DAILY AT 12 NOON. 90 tablet 0   isosorbide mononitrate (IMDUR) 60 MG 24 hr tablet Take 1 tablet (60 mg total) by mouth daily. 90 tablet 3   lisinopril (PRINIVIL,ZESTRIL) 5 MG tablet Take 5 mg by mouth daily.     metoprolol succinate (TOPROL-XL) 25 MG 24 hr tablet TAKE 2 TABLETS BY MOUTH ONCE DAILY. 180 tablet 3   nitroGLYCERIN (NITROSTAT) 0.4 MG SL tablet DISSOLVE 1 TABLET UNDER TONGUE EVERY 5 MINUTES UP TO 15 MIN FOR CHEST PAIN. IF NO RELIEF CALL 911. 25 tablet 4   omeprazole (PRILOSEC) 20 MG capsule Take 20 mg by mouth daily.     ONETOUCH ULTRA test strip USE AS DIRECTED UP TO 3 TIMES DAILY IF NEEDED. 100 strip 3   OVER THE COUNTER MEDICATION Stool softner one daily     ranolazine (RANEXA) 500 MG 12 hr tablet Take 500 mg by mouth 2 (two) times daily.     Semaglutide (RYBELSUS) 7 MG TABS Take 1 tablet (7 mg total) by mouth daily. 90 tablet 3   sucralfate (CARAFATE) 1 g tablet Take 1 tablet (1 g total) by mouth 4 (four) times daily as needed. 90 tablet 1   sucralfate (CARAFATE) 1 GM/10ML suspension Take 10 mLs (1 g total) by mouth 4 (four) times daily. 420 mL 1   thyroid (NP THYROID) 15 MG tablet Take 1 tablet (15 mg total) by mouth daily. 90 tablet 1   No facility-administered medications prior to visit.    Allergies  Allergen Reactions   Crestor [Rosuvastatin]     myalgia   Jardiance [Empagliflozin]     Penile swelling and rash   Propoxyphene N-Acetaminophen Nausea Only   Statins Other (See Comments)    Severe muscle cramping/aching/pain/ elevated CK   Tape     Blisters   Zetia [Ezetimibe]     Muscle cramping   Zocor [Simvastatin]     myalgia   Flomax  [Tamsulosin Hcl] Rash   Flomax [Tamsulosin] Rash   Levaquin [Levofloxacin Hemihydrate] Rash   Levofloxacin Rash   Penicillin G Diarrhea and Rash   Penicillins Rash    Broke out in rash 6 years ago, pt recently took penicillin (09/2016) and had no reaction Has patient had a PCN reaction causing immediate rash, facial/tongue/throat swelling, SOB or lightheadedness with hypotension: Yes Has patient had a PCN reaction causing severe rash involving mucus membranes or skin necrosis: Unknown Has patient had a PCN reaction that required hospitalization: No Has patient had a PCN reaction occurring within the last 10 years: Yes If all of the above answers are "NO", then m    ROS Review of Systems  Constitutional:  Negative for chills and fever.  HENT:  Negative for congestion and sore throat.   Eyes:  Negative for pain and discharge.  Respiratory:  Negative for cough and shortness of breath.   Cardiovascular:  Negative for chest pain and palpitations.  Gastrointestinal:  Negative for diarrhea, nausea and vomiting.  Endocrine: Negative for polydipsia and polyuria.  Genitourinary:  Negative for dysuria and hematuria.  Musculoskeletal:  Positive for back pain. Negative for  neck pain and neck stiffness.  Skin:  Negative for rash.  Neurological:  Negative for dizziness, weakness, numbness and headaches.  Psychiatric/Behavioral:  Negative for agitation and behavioral problems.       Objective:    Physical Exam Vitals reviewed.  Constitutional:      General: He is not in acute distress.    Appearance: He is not diaphoretic.  HENT:     Head: Normocephalic and atraumatic.     Nose: Nose normal.     Mouth/Throat:     Mouth: Mucous membranes are moist.  Eyes:     General: No scleral icterus.    Extraocular Movements: Extraocular movements intact.  Cardiovascular:     Rate and Rhythm: Normal rate and regular rhythm.     Pulses: Normal pulses.     Heart sounds: Normal heart sounds. No  murmur heard. Pulmonary:     Breath sounds: Normal breath sounds. No wheezing or rales.  Abdominal:     Palpations: Abdomen is soft.     Tenderness: There is no abdominal tenderness.  Musculoskeletal:     Cervical back: Neck supple. No tenderness.     Lumbar back: Tenderness (paraspinal) present.     Right lower leg: No edema.     Left lower leg: No edema.  Skin:    General: Skin is warm.     Findings: No rash.  Neurological:     General: No focal deficit present.     Mental Status: He is alert and oriented to person, place, and time.     Sensory: No sensory deficit.     Motor: No weakness.  Psychiatric:        Mood and Affect: Mood normal.        Behavior: Behavior normal.   6  BP 128/67 (BP Location: Left Arm, Patient Position: Sitting, Cuff Size: Large)   Pulse 65   Ht 6' (1.829 m)   Wt 211 lb 9.6 oz (96 kg)   SpO2 94%   BMI 28.70 kg/m  Wt Readings from Last 3 Encounters:  11/18/22 211 lb 9.6 oz (96 kg)  10/27/22 210 lb 9.6 oz (95.5 kg)  08/20/22 211 lb 3.2 oz (95.8 kg)    Lab Results  Component Value Date   TSH 0.383 (L) 08/12/2022   Lab Results  Component Value Date   WBC 5.7 08/12/2022   HGB 14.8 08/12/2022   HCT 43.5 08/12/2022   MCV 99 (H) 08/12/2022   PLT 169 08/12/2022   Lab Results  Component Value Date   NA 142 08/12/2022   K 4.0 08/12/2022   CO2 23 08/12/2022   GLUCOSE 151 (H) 08/12/2022   BUN 14 08/12/2022   CREATININE 1.34 (H) 08/12/2022   BILITOT 0.6 08/12/2022   ALKPHOS 49 08/12/2022   AST 23 08/12/2022   ALT 19 08/12/2022   PROT 6.9 08/12/2022   ALBUMIN 4.0 08/12/2022   CALCIUM 10.2 08/12/2022   ANIONGAP 3 (L) 05/22/2021   EGFR 55 (L) 08/12/2022   Lab Results  Component Value Date   CHOL 159 08/12/2022   Lab Results  Component Value Date   HDL 36 (L) 08/12/2022   Lab Results  Component Value Date   LDLCALC 76 08/12/2022   Lab Results  Component Value Date   TRIG 287 (H) 08/12/2022   Lab Results  Component Value  Date   CHOLHDL 4.4 08/12/2022   Lab Results  Component Value Date   HGBA1C 7.5 (H) 08/12/2022  Assessment & Plan:   Problem List Items Addressed This Visit       Cardiovascular and Mediastinum   DM (diabetes mellitus), type 2 with peripheral vascular complications (Chronic)    Lab Results  Component Value Date   HGBA1C 7.5 (H) 08/12/2022  Recently improved to 6.4 at [facility] clinic Well-controlled On glipizide-metformin 5-500 mg QD currently On Rybelsus 7 mg QD, advised to restart taking it Had given Jardiance for additional cardiac benefit, but had rash and penile swelling, DC Jardiance Advised to follow diabetic diet On statin and ACEi F/u BMP and HbA1C Diabetic eye exam: Advised to follow up with Ophthalmology for diabetic eye exam      Essential hypertension (Chronic)    BP Readings from Last 1 Encounters:  11/18/22 128/67  Well-controlled Counseled for compliance with the medications Advised DASH diet and moderate exercise/walking        Endocrine   Hypothyroidism    Was started on NP thyroid by previous PCP even though his TSH was wnl Checked TSH and free T4 - decreased dose of NP thyroid to 15 mg QD        Other   Acute bilateral low back pain without sciatica - Primary    His pain is likely musculoskeletal in origin, less likely to be related related to Rybelsus Avoid heavy lifting and frequent bending Prednisone 40 mg QD X 5 days, avoid oral NSAIDs due to his history of CAD and GERD Tylenol as needed for pain Flexeril as needed for neck muscle spasms Simple back exercises material provided Check x-ray lumbar spine Referred to PT      Relevant Medications   predniSONE (DELTASONE) 20 MG tablet   Other Relevant Orders   DG Lumbar Spine Complete   Ambulatory referral to Physical Therapy    Meds ordered this encounter  Medications   predniSONE (DELTASONE) 20 MG tablet    Sig: Take 2 tablets (40 mg total) by mouth daily with breakfast.     Dispense:  10 tablet    Refill:  0     Follow-up: Return if symptoms worsen or fail to improve.    Anabel Halon, MD

## 2022-11-18 NOTE — Assessment & Plan Note (Signed)
Lab Results  Component Value Date   HGBA1C 7.5 (H) 08/12/2022   Recently improved to 6.4 at [facility] clinic Well-controlled On glipizide-metformin 5-500 mg QD currently On Rybelsus 7 mg QD, advised to restart taking it Had given Jardiance for additional cardiac benefit, but had rash and penile swelling, DC Jardiance Advised to follow diabetic diet On statin and ACEi F/u BMP and HbA1C Diabetic eye exam: Advised to follow up with Ophthalmology for diabetic eye exam

## 2022-11-18 NOTE — Assessment & Plan Note (Signed)
BP Readings from Last 1 Encounters:  11/18/22 128/67   Well-controlled Counseled for compliance with the medications Advised DASH diet and moderate exercise/walking

## 2022-11-18 NOTE — Assessment & Plan Note (Signed)
Was started on NP thyroid by previous PCP even though his TSH was wnl Checked TSH and free T4 - decreased dose of NP thyroid to 15 mg QD

## 2022-11-18 NOTE — Assessment & Plan Note (Signed)
His pain is likely musculoskeletal in origin, less likely to be related related to Rybelsus Avoid heavy lifting and frequent bending Prednisone 40 mg QD X 5 days, avoid oral NSAIDs due to his history of CAD and GERD Tylenol as needed for pain Flexeril as needed for neck muscle spasms Simple back exercises material provided Check x-ray lumbar spine Referred to PT

## 2022-11-18 NOTE — Patient Instructions (Addendum)
Please take Prednisone as prescribed.  Please take Tylenol as needed for pain.  Please take Flexeril as needed for back muscle spasms.  Please avoid heavy lifting and frequent bending.  Please perform simple back exercises as shown in the material.  Please start taking Rybelsus as prescribed. Okay to take Senokot-S for constipation.

## 2022-11-21 ENCOUNTER — Other Ambulatory Visit: Payer: Self-pay | Admitting: Cardiovascular Disease

## 2022-11-24 ENCOUNTER — Other Ambulatory Visit: Payer: Self-pay

## 2022-11-24 ENCOUNTER — Encounter (HOSPITAL_COMMUNITY): Payer: Self-pay | Admitting: Physical Therapy

## 2022-11-24 ENCOUNTER — Ambulatory Visit (HOSPITAL_COMMUNITY): Payer: PPO | Attending: Internal Medicine | Admitting: Physical Therapy

## 2022-11-24 DIAGNOSIS — M545 Low back pain, unspecified: Secondary | ICD-10-CM | POA: Diagnosis not present

## 2022-11-24 DIAGNOSIS — R29898 Other symptoms and signs involving the musculoskeletal system: Secondary | ICD-10-CM | POA: Insufficient documentation

## 2022-11-24 DIAGNOSIS — M5459 Other low back pain: Secondary | ICD-10-CM | POA: Insufficient documentation

## 2022-11-24 DIAGNOSIS — R2689 Other abnormalities of gait and mobility: Secondary | ICD-10-CM | POA: Insufficient documentation

## 2022-11-24 DIAGNOSIS — M6281 Muscle weakness (generalized): Secondary | ICD-10-CM | POA: Diagnosis present

## 2022-11-24 NOTE — Therapy (Signed)
OUTPATIENT PHYSICAL THERAPY THORACOLUMBAR EVALUATION   Patient Name: [name] MRN: [id number] DOB:Dec 02, 1945, 77 y.o., male Today's Date: 11/24/2022  END OF SESSION:  PT End of Session - 11/24/22 0953     Visit Number 1    Number of Visits 2    Date for PT Re-Evaluation 12/10/22    Authorization Type [employer]    PT Start Time 0953    PT Stop Time 1028    PT Time Calculation (min) 35 min    Activity Tolerance Patient tolerated treatment well    Behavior During Therapy [facility] for tasks assessed/performed             Past Medical History:  Diagnosis Date   CHF (congestive heart failure)    Coronary artery disease 08/28/2010   s/p multiple caths 2012, BMS PCI OM1 on August 28, 2010-during NSTEMI; January 2019 non-STEMI- occlusion of OM stent (very late stent thrombosis), initial wire crossed with PTCA, but unable to rewire, PCI aborted--> plan medical therapy, normal EF   Diabetes mellitus    GERD (gastroesophageal reflux disease)    Hyperlipidemia    Hypertension    Non-STEMI (non-ST elevated myocardial infarction) January 2012 and 2019   a) Jan 2012: 99% OM1 - BMS PCI; b) Jan 2019: Very late stent thrombosis/100% OM1 -after initially causing him for repeat PTCA restoring flow, unable to recross to place stent. - >  Medical therapy.   NSTEMI (non-ST elevated myocardial infarction)    Admitted 08/29/17 with NSTEMI-Troponin peak 8.8, normal LVF   PVD (peripheral vascular disease)    left SFA PTA & stenting in 02/2005 (Dr. Erlene Quan)   Vitamin D deficiency disease 05/04/2019   Past Surgical History:  Procedure Laterality Date   BIOPSY  08/02/2019   Procedure: BIOPSY;  Surgeon: Corbin Ade, MD;  [facility];  Service: Endoscopy;;  gastric    CARDIAC CATHETERIZATION  12/23/2004   normal L main, normal LAD, normal L Cfx, RCA with 20% hypodense lesion in first end of vessel (Dr. Erlene Quan)   CARDIAC CATHETERIZATION  08/26/2007   no significant CAD by  cath, EF 50% (Dr. Evlyn Courier)   COLONOSCOPY  12/2009   Dr. Louie Casa   COLONOSCOPY N/A 01/30/2017   pancolonic diverticulosis, non-bleeding internal hemorrhoids.   COLONOSCOPY WITH PROPOFOL N/A 09/08/2019   Procedure: COLONOSCOPY WITH PROPOFOL;  Surgeon: Corbin Ade, MD;  [facility];  Service: Endoscopy;  Laterality: N/A;  11:15am   CORONARY BALLOON ANGIOPLASTY N/A 08/30/2017   Procedure: CORONARY BALLOON ANGIOPLASTY;  Surgeon: Runell Gess, MD;  [facility];  Service: Cardiovascular;  100% CTO very late stent thrombosis OM1 -> initially crossed with PTCA, but then unable to recross after losing my positioning.  PTCA ABORTED.  UNSUCCESSFUL ATTEMPT   CORONARY BALLOON ANGIOPLASTY  02/10/2011   95% prox in-stent restenosis within OM stent - opened with cutting balloon (Dr. Bishop Limbo)   CORONARY STENT INTERVENTION  07/17/2011   in-stent restenosis - re-stented with Promus 2.25x56mm DES (Dr. Ranae Palms)   CORONARY STENT INTERVENTION  08/28/2010   NSTEMI: OM1 99% BMS PCI 2.0x39mm MiniVision BMS (Dr. 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 Erlene Quan)   LEFT HEART CATH AND CORONARY ANGIOGRAPHY N/A 08/30/2017   Procedure: LEFT HEART CATH AND CORONARY ANGIOGRAPHY;  Surgeon: Runell Gess, MD;  [facility];  Service: Cardiovascular;  100% very late stent thrombosis of Overlapped BMS-DES OM1 -> attempted PTCA   LEFT HEART CATH AND CORONARY ANGIOGRAPHY   08/28/2010   NSTEMI: OM1 99% BMS PCI  (Dr. Erlene Quan)   LEFT HEART CATH AND CORONARY ANGIOGRAPHY  09/11/2010   patent stent (Dr. Ranae Palms)   LEFT HEART CATH AND CORONARY ANGIOGRAPHY  02/10/2011   95% prox in-stent restenosis within OM stent  (Dr. Bishop Limbo)   LEFT HEART CATHETERIZATION WITH CORONARY ANGIOGRAM N/A 07/17/2011   Procedure: LEFT HEART CATHETERIZATION WITH CORONARY ANGIOGRAM;  Surgeon: Marykay Lex, MD;  Location: [facility] LAB;  Service: Cardiovascular;  Laterality: N/A;  Right radial approach;  90% ISR of BMS (5 months post PTCA for ISR) --> DES PCI   left knee arthroscopy  05/2016   NM MYOCAR PERF WALL MOTION  09/08/2013   abnormal lexiscan - low to intermediate risk;    POLYPECTOMY  09/08/2019   Procedure: POLYPECTOMY;  Surgeon: Corbin Ade, MD;  [facility];  Service: Endoscopy;;   TOOTH EXTRACTION Right 06/19/2020   TRANSTHORACIC ECHOCARDIOGRAM  09/01/2017   Normal LV size and function.  EF 66 5%.  Normal wall motion.  GR 1 DD.  Mild aortic sclerosis.  Aortic root mildly dilated at 40 mm.   Patient Active Problem List   Diagnosis Date Noted   Acute bilateral low back pain without sciatica 11/18/2022   Encounter for general adult medical examination with abnormal findings 08/22/2022   Onychomycosis 12/09/2021   Chronic sinusitis 09/11/2021   Hypothyroidism 09/11/2021   Atypical angina 06/22/2020   Statin-induced myositis 03/23/2020   Constipation 08/24/2019   IDA (iron deficiency anemia) 05/26/2019   Vitamin D deficiency disease 05/04/2019   GERD (gastroesophageal reflux disease) 01/09/2017   Hepatomegaly 11/08/2014   Essential hypertension 09/07/2013   CAD S/P percutaneous coronary angioplasty 07/16/2011   DM (diabetes mellitus), type 2 with peripheral vascular complications 07/16/2011   Hyperlipidemia with target LDL less than 70 07/16/2011   Peripheral vascular disease 01/10/2010    PCP: Anabel Halon, MD  REFERRING PROVIDER: Anabel Halon,  MD  REFERRING DIAG: M54.50 (ICD-10-CM) - Acute bilateral low back pain without sciatica  Rationale for Evaluation and Treatment: Rehabilitation  THERAPY DIAG:  Other low back pain  Muscle weakness (generalized)  Other abnormalities of gait and mobility  Other symptoms and signs involving the musculoskeletal system  ONSET DATE: 2 weeks  SUBJECTIVE:  SUBJECTIVE STATEMENT: Patient states LBP with insidious onset about 2 weeks ago. No MOI noted. Patient states low back doing better but still some pain on low back R side and R flank. Pain with reaching overhead. Increased symptoms with bending forward. Tender with first getting up and eases with mobility. Has been working on home remodeling, electrical, etc.   PERTINENT HISTORY:  HTN, CAD, PAD, type II DM, GERD, HLD  PAIN:  Are you having pain? No  PRECAUTIONS: None  WEIGHT BEARING RESTRICTIONS: No  FALLS:  Has patient fallen in last 6 months? No  OCCUPATION: retired  PLOF: Independent  PATIENT GOALS: see what exercises he can do at home  NEXT MD VISIT: May 2024  OBJECTIVE:   DIAGNOSTIC FINDINGS:  XR 11/18/22 IMPRESSION: Moderate degenerative changes at L4-L5 and L5-S1.  PATIENT SURVEYS:  Not complete due to 1 time visit  SCREENING FOR RED FLAGS: Bowel or bladder incontinence: No Spinal tumors: No Cauda equina syndrome: No Compression fracture: No Abdominal aneurysm: No  COGNITION: Overall cognitive status: Within functional limits for tasks assessed     SENSATION: WFL   POSTURE: rounded shoulders, forward head, and decreased lumbar lordosis  PALPATION: TTP R lower ribs, hypomobile grossly lumbar and thoracic spine  LUMBAR ROM:   AROM eval  Flexion 0% limited  Extension 25% limited *  Right lateral flexion 25%  limited  Left lateral flexion 25% limited *  Right rotation 0% limited  Left rotation 0% limited   (Blank rows = not tested)  LOWER EXTREMITY ROM:   WFL for tasks assessed  Active  Right eval Left eval  Hip flexion    Hip extension    Hip abduction    Hip adduction    Hip internal rotation    Hip external rotation    Knee flexion    Knee extension    Ankle dorsiflexion    Ankle plantarflexion    Ankle inversion    Ankle eversion     (Blank rows = not tested)  LOWER EXTREMITY MMT:    MMT Right eval Left eval  Hip flexion 5 5  Hip extension 4+ 4+  Hip abduction 4+ 5  Hip adduction    Hip internal rotation    Hip external rotation    Knee flexion 5 5  Knee extension 5 5  Ankle dorsiflexion 5 5  Ankle plantarflexion    Ankle inversion    Ankle eversion     (Blank rows = not tested)     GAIT: Distance walked: 120 feet Assistive device utilized: None Level of assistance: Complete Independence Comments: WFL  TODAY'S TREATMENT:                                                                                                                              DATE:  11/24/22 Overhead reach stretch 5 x 10 second holds bilateral Open book 10 x 5 second holds  Self STM and mobs to  R lower ribs, paraspinals, obliques   PATIENT EDUCATION:  Education details: Patient educated on exam findings, POC, scope of PT, HEP, and self mobilizations. Person educated: Patient Education method: Explanation, Demonstration, and Handouts Education comprehension: verbalized understanding, returned demonstration, verbal cues required, and tactile cues required  HOME EXERCISE PROGRAM: Access Code: [email or web address] URL: [email or web address] Date: 11/24/2022 - TL Sidebending Stretch - Single Arm Overhead  - 2-3 x daily - 7 x weekly - 5 reps - 10 second hold - Sidelying Thoracic Rotation with Open Book  - 2-3 x daily - 7 x weekly - 10 reps - 5 second hold - Standing massage with  ball at wall on low back  - 1 x daily - 7 x weekly  ASSESSMENT:  CLINICAL IMPRESSION: Patient a 77 y.o. y.o. male who was seen today for physical therapy evaluation and treatment for acute LBP. Patient presents with pain limited deficits in lumbar spine strength, ROM, endurance, activity tolerance, and functional mobility with ADL. Patient is having to modify and restrict ADL as indicated by outcome measure score as well as subjective information and objective measures which is affecting overall participation. Patient will benefit from skilled physical therapy in order to improve function and reduce impairment.  OBJECTIVE IMPAIRMENTS: Abnormal gait, decreased activity tolerance, decreased endurance, decreased mobility, difficulty walking, decreased ROM, decreased strength, hypomobility, increased muscle spasms, impaired flexibility, improper body mechanics, postural dysfunction, and pain.   ACTIVITY LIMITATIONS: carrying, lifting, bending, standing, squatting, stairs, transfers, locomotion level, and caring for others  PARTICIPATION LIMITATIONS: meal prep, cleaning, laundry, shopping, community activity, and yard work  PERSONAL FACTORS: 3+ comorbidities:  HTN, CAD, PAD, type II DM, GERD, HLD   are also affecting patient's functional outcome.   REHAB POTENTIAL: Good  CLINICAL DECISION MAKING: Stable/uncomplicated  EVALUATION COMPLEXITY: Low   GOALS: Goals reviewed with patient? Yes  SHORT/LONG TERM GOALS: Target date: 12/10/22  Patient will be independent with HEP in order to improve functional outcomes. Baseline:  Goal status: INITIAL  2.  Patient will report at least 75% improvement in symptoms for improved quality of life. Baseline:  Goal status: INITIAL  3.  Patient will demonstrate at least 25% improvement in lumbar ROM in all restricted planes for improved ability to move trunk while completing chores/remodeling. Baseline:  Goal status: INITIAL     PLAN:  PT FREQUENCY:   1 follow up  PT DURATION: 2 weeks  PLANNED INTERVENTIONS: Therapeutic exercises, Therapeutic activity, Neuromuscular re-education, Balance training, Gait training, Patient/Family education, Joint manipulation, Joint mobilization, Stair training, Orthotic/Fit training, DME instructions, Aquatic Therapy, Dry Needling, Electrical stimulation, Spinal manipulation, Spinal mobilization, Cryotherapy, Moist heat, Compression bandaging, scar mobilization, Splintting, Taping, Traction, Ultrasound, Ionotophoresis 4mg /ml Dexamethasone, and Manual therapy  PLAN FOR NEXT SESSION: f/u with HEP, spinal mobility and glute strength if needed   Wyman Songster, PT 11/24/2022, 10:53 AM

## 2022-12-03 ENCOUNTER — Ambulatory Visit (INDEPENDENT_AMBULATORY_CARE_PROVIDER_SITE_OTHER): Payer: PPO

## 2022-12-03 ENCOUNTER — Ambulatory Visit
Admission: EM | Admit: 2022-12-03 | Discharge: 2022-12-03 | Disposition: A | Discharge status: Home / Self Care | Payer: PPO | Attending: Family Medicine | Admitting: Family Medicine

## 2022-12-03 DIAGNOSIS — R042 Hemoptysis: Secondary | ICD-10-CM

## 2022-12-03 DIAGNOSIS — R051 Acute cough: Secondary | ICD-10-CM

## 2022-12-03 MED ORDER — BENZONATATE 200 MG PO CAPS: ORAL_CAPSULE | ORAL | 0 refills | Status: DC

## 2022-12-03 NOTE — ED Triage Notes (Signed)
Pt reports he is coughing up mucus with blood x 2 days. Took theraflu which gave some relief.

## 2022-12-03 NOTE — ED Provider Notes (Addendum)
[name]   [id number] 12/03/22 Arrival Time: 1253  ASSESSMENT & PLAN:  1. Acute cough   2. Cough with hemoptysis    I have personally viewed and independently interpreted the imaging studies obtained this visit. No acute changes of lungs on CXR. No pneumothorax. No PNA.  Discussed typical duration of likely viral illness with lingering cough. He has not coughed up any more blood today. OTC symptom care as needed.  As needed: New Prescriptions   BENZONATATE (TESSALON) 200 MG CAPSULE    Take 1 capsule by mouth every 8 (eight) hours for cough.     Follow-up Information     Schedule an appointment as soon as possible for a visit  with Ricky Halon, MD.   Specialty: Internal Medicine Why: For follow up. Contact information: [address] [place] [address] [phone]                 Reviewed expectations re: course of current medical issues. Questions answered. Outlined signs and symptoms indicating need for more acute intervention. Understanding verbalized. After Visit Summary given.   SUBJECTIVE: History from: Patient. [name] is a 77 y.o. male. Reports mild cold symptoms last week. Now reports coughing up mucus with blood x 2 days; none today. Denies CP/SOB. Took theraflu which gave some relief.  Denies: fever. Normal PO intake without n/v/d.  OBJECTIVE:  Vitals:   12/03/22 1301  BP: 137/76  Pulse: 63  Resp: 20  Temp: (!) 97.5 F (36.4 C)  TempSrc: Oral  SpO2: 96%    General appearance: alert; no distress Eyes: PERRLA; EOMI; conjunctiva normal HENT: Bee; AT; without nasal congestion Neck: supple  Lungs: speaks full sentences without difficulty; unlabored; CTAB Extremities: no edema Skin: warm and dry Neurologic: normal gait Psychological: alert and cooperative; normal mood and affect   Imaging: DG Chest 2 View  Result Date: 12/03/2022 CLINICAL DATA:  Coughing up blood EXAM: CHEST - 2 VIEW COMPARISON:  Chest  x-ray 05/22/2021.  Chest CT 06/03/2012. FINDINGS: The heart size and mediastinal contours are within normal limits. Both lungs are clear. The visualized skeletal structures are unremarkable. Punctate surgical marker seen in the right anterior chest wall, unchanged. IMPRESSION: No active cardiopulmonary disease. Electronically Signed   By: Darliss Cheney M.D.   On: 12/03/2022 13:48    Allergies  Allergen Reactions   Crestor [Rosuvastatin]     myalgia   Jardiance [Empagliflozin]     Penile swelling and rash   Propoxyphene N-Acetaminophen Nausea Only   Statins Other (See Comments)    Severe muscle cramping/aching/pain/ elevated CK   Tape     Blisters   Zetia [Ezetimibe]     Muscle cramping   Zocor [Simvastatin]     myalgia   Flomax [Tamsulosin Hcl] Rash   Flomax [Tamsulosin] Rash   Levaquin [Levofloxacin Hemihydrate] Rash   Levofloxacin Rash   Penicillin G Diarrhea and Rash   Penicillins Rash    Broke out in rash 6 years ago, pt recently took penicillin (09/2016) and had no reaction Has patient had a PCN reaction causing immediate rash, facial/tongue/throat swelling, SOB or lightheadedness with hypotension: Yes Has patient had a PCN reaction causing severe rash involving mucus membranes or skin necrosis: Unknown Has patient had a PCN reaction that required hospitalization: No Has patient had a PCN reaction occurring within the last 10 years: Yes If all of the above answers are "NO", then m    Past Medical History:  Diagnosis Date  CHF (congestive heart failure)    Coronary artery disease 08/28/2010   s/p multiple caths 2012, BMS PCI OM1 on August 28, 2010-during NSTEMI; January 2019 non-STEMI- occlusion of OM stent (very late stent thrombosis), initial wire crossed with PTCA, but unable to rewire, PCI aborted--> plan medical therapy, normal EF   Diabetes mellitus    GERD (gastroesophageal reflux disease)    Hyperlipidemia    Hypertension    Non-STEMI (non-ST elevated myocardial  infarction) January 2012 and 2019   a) Jan 2012: 99% OM1 - BMS PCI; b) Jan 2019: Very late stent thrombosis/100% OM1 -after initially causing him for repeat PTCA restoring flow, unable to recross to place stent. - >  Medical therapy.   NSTEMI (non-ST elevated myocardial infarction)    Admitted 08/29/17 with NSTEMI-Troponin peak 8.8, normal LVF   PVD (peripheral vascular disease)    left SFA PTA & stenting in 02/2005 (Dr. Erlene Quan)   Vitamin D deficiency disease 05/04/2019   Social History   Socioeconomic History   Marital status: Married    Spouse name: [name]   Number of children: 3   Years of education: 12   Highest education level: Some college, no degree  Occupational History   Occupation: Retired    Associate Professor: [employer]  Tobacco Use   Smoking status: Former    Packs/day: 1.00    Years: 30.00    Additional pack years: 0.00    Total pack years: 30.00    Types: Cigarettes    Quit date: 05/12/1999    Years since quitting: 23.5    Passive exposure: Past   Smokeless tobacco: Never  Vaping Use   Vaping Use: Never used  Substance and Sexual Activity   Alcohol use: No   Drug use: No   Sexual activity: Yes  Other Topics Concern   Not on file  Social History Narrative   Married for 49 years.Lives with wife.Retired,ex-lab Pensions consultant.Ex-Marine,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   Social Determinants of Health   Financial Resource Strain: Low Risk  (09/15/2022)   Overall Financial Resource Strain (CARDIA)    Difficulty of Paying Living Expenses: Not hard at all  Food Insecurity: No Food Insecurity (09/15/2022)   Hunger Vital Sign    Worried About Running Out of Food in the Last Year: Never true    Ran Out of Food in the Last Year: Never true  Transportation Needs: No Transportation Needs (09/15/2022)   PRAPARE - Administrator, Civil Service (Medical): No    Lack of Transportation (Non-Medical): No  Physical Activity: Insufficiently Active (09/15/2022)   Exercise Vital Sign     Days of Exercise per Week: 3 days    Minutes of Exercise per Session: 30 min  Stress: No Stress Concern Present (09/15/2022)   [employer] of Occupational Health - Occupational Stress Questionnaire    Feeling of Stress : Not at all  Social Connections: Moderately Integrated (09/15/2022)   Social Connection and Isolation Panel [NHANES]    Frequency of Communication with Friends and Family: More than three times a week    Frequency of Social Gatherings with Friends and Family: More than three times a week    Attends Religious Services: More than 4 times per year    Active Member of [employer] or Organizations: No    Attends Banker Meetings: Never    Marital Status: Married  Catering manager Violence: Not At Risk (09/15/2022)   Humiliation, Afraid, Rape, and Kick questionnaire    Fear  of Current or Ex-Partner: No    Emotionally Abused: No    Physically Abused: No    Sexually Abused: No   Family History  Problem Relation Age of Onset   Arrhythmia Mother 16   Early death Son    Colon cancer Neg Hx    Liver disease Neg Hx    Inflammatory bowel disease Neg Hx    Colon polyps Neg Hx    Past Surgical History:  Procedure Laterality Date   BIOPSY  08/02/2019   Procedure: BIOPSY;  Surgeon: Corbin Ade, MD;  [facility];  Service: Endoscopy;;  gastric    CARDIAC CATHETERIZATION  12/23/2004   normal L main, normal LAD, normal L Cfx, RCA with 20% hypodense lesion in first end of vessel (Dr. Erlene Quan)   CARDIAC CATHETERIZATION  08/26/2007   no significant CAD by cath, EF 50% (Dr. Evlyn Courier)   COLONOSCOPY  12/2009   Dr. Louie Casa   COLONOSCOPY N/A 01/30/2017   pancolonic diverticulosis, non-bleeding internal hemorrhoids.   COLONOSCOPY WITH PROPOFOL N/A 09/08/2019   Procedure: COLONOSCOPY WITH PROPOFOL;  Surgeon: Corbin Ade, MD;  [facility];  Service: Endoscopy;  Laterality: N/A;  11:15am   CORONARY BALLOON ANGIOPLASTY N/A 08/30/2017   Procedure:  CORONARY BALLOON ANGIOPLASTY;  Surgeon: Runell Gess, MD;  [facility];  Service: Cardiovascular;  100% CTO very late stent thrombosis OM1 -> initially crossed with PTCA, but then unable to recross after losing my positioning.  PTCA ABORTED.  UNSUCCESSFUL ATTEMPT   CORONARY BALLOON ANGIOPLASTY  02/10/2011   95% prox in-stent restenosis within OM stent - opened with cutting balloon (Dr. Bishop Limbo)   CORONARY STENT INTERVENTION  07/17/2011   in-stent restenosis - re-stented with Promus 2.25x55mm DES (Dr. Ranae Palms)   CORONARY STENT INTERVENTION  08/28/2010   NSTEMI: OM1 99% BMS PCI 2.0x31mm MiniVision BMS (Dr. 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 Erlene Quan)   LEFT HEART CATH AND CORONARY ANGIOGRAPHY N/A 08/30/2017   Procedure: LEFT HEART CATH AND CORONARY ANGIOGRAPHY;  Surgeon: Runell Gess, MD;  [facility];  Service: Cardiovascular;  100% very late stent thrombosis of Overlapped BMS-DES OM1 -> attempted PTCA   LEFT HEART CATH AND CORONARY ANGIOGRAPHY  08/28/2010   NSTEMI: OM1 99% BMS PCI  (Dr. Erlene Quan)   LEFT HEART CATH AND CORONARY ANGIOGRAPHY  09/11/2010   patent stent (Dr. Ranae Palms)   LEFT HEART CATH AND CORONARY ANGIOGRAPHY  02/10/2011   95% prox in-stent restenosis within OM stent  (Dr. Bishop Limbo)   LEFT HEART CATHETERIZATION WITH CORONARY ANGIOGRAM N/A 07/17/2011   Procedure: LEFT HEART CATHETERIZATION WITH CORONARY ANGIOGRAM;  Surgeon: Marykay Lex, MD;  Location: [facility];  Service: Cardiovascular;  Laterality: N/A;  Right radial approach;  90% ISR of BMS (5 months post PTCA for ISR) --> DES PCI   left knee arthroscopy  05/2016   NM MYOCAR PERF WALL MOTION  09/08/2013   abnormal lexiscan - low to intermediate risk;  POLYPECTOMY  09/08/2019   Procedure: POLYPECTOMY;  Surgeon: Corbin Ade, MD;  [facility];  Service: Endoscopy;;   TOOTH EXTRACTION Right 06/19/2020   TRANSTHORACIC ECHOCARDIOGRAM  09/01/2017   Normal LV size and function.  EF 66 5%.  Normal wall motion.  GR 1 DD.  Mild aortic sclerosis.  Aortic root mildly dilated at 40 mm.     Mardella Layman, MD 12/03/22 1359    Mardella Layman, MD 12/03/22 1359

## 2022-12-03 NOTE — Discharge Instructions (Signed)
Your chest x-ray looked good today.

## 2022-12-10 ENCOUNTER — Encounter (HOSPITAL_COMMUNITY): Payer: Self-pay | Admitting: Physical Therapy

## 2022-12-23 ENCOUNTER — Other Ambulatory Visit: Payer: Self-pay | Admitting: Internal Medicine

## 2022-12-23 ENCOUNTER — Ambulatory Visit: Payer: HMO | Admitting: Internal Medicine

## 2022-12-23 DIAGNOSIS — E1151 Type 2 diabetes mellitus with diabetic peripheral angiopathy without gangrene: Secondary | ICD-10-CM

## 2023-01-14 ENCOUNTER — Ambulatory Visit (INDEPENDENT_AMBULATORY_CARE_PROVIDER_SITE_OTHER): Payer: PPO | Admitting: Internal Medicine

## 2023-01-14 ENCOUNTER — Encounter: Payer: Self-pay | Admitting: Internal Medicine

## 2023-01-14 VITALS — BP 126/66 | HR 68 | Resp 16 | Ht 72.0 in | Wt 213.0 lb

## 2023-01-14 DIAGNOSIS — Z7984 Long term (current) use of oral hypoglycemic drugs: Secondary | ICD-10-CM

## 2023-01-14 DIAGNOSIS — I10 Essential (primary) hypertension: Secondary | ICD-10-CM

## 2023-01-14 DIAGNOSIS — N1831 Chronic kidney disease, stage 3a: Secondary | ICD-10-CM

## 2023-01-14 DIAGNOSIS — N401 Enlarged prostate with lower urinary tract symptoms: Secondary | ICD-10-CM | POA: Insufficient documentation

## 2023-01-14 DIAGNOSIS — Z9861 Coronary angioplasty status: Secondary | ICD-10-CM

## 2023-01-14 DIAGNOSIS — I2510 Atherosclerotic heart disease of native coronary artery without angina pectoris: Secondary | ICD-10-CM | POA: Diagnosis not present

## 2023-01-14 DIAGNOSIS — I739 Peripheral vascular disease, unspecified: Secondary | ICD-10-CM | POA: Diagnosis not present

## 2023-01-14 DIAGNOSIS — E1151 Type 2 diabetes mellitus with diabetic peripheral angiopathy without gangrene: Secondary | ICD-10-CM | POA: Diagnosis not present

## 2023-01-14 DIAGNOSIS — R350 Frequency of micturition: Secondary | ICD-10-CM

## 2023-01-14 DIAGNOSIS — E039 Hypothyroidism, unspecified: Secondary | ICD-10-CM

## 2023-01-14 DIAGNOSIS — E785 Hyperlipidemia, unspecified: Secondary | ICD-10-CM

## 2023-01-14 NOTE — Assessment & Plan Note (Signed)
Lab Results  Component Value Date   HGBA1C 7.5 (H) 08/12/2022   Recently improved to 6.4 at [facility] clinic Well-controlled On glipizide-metformin 5-500 mg QD currently On Rybelsus 7 mg QD Had given Jardiance for additional cardiac benefit, but had rash and penile swelling, DC Jardiance Advised to follow diabetic diet On statin and ACEi F/u BMP and HbA1C Diabetic eye exam: Advised to follow up with Ophthalmology for diabetic eye exam

## 2023-01-14 NOTE — Assessment & Plan Note (Signed)
Has had stent placed in LLE °Follows up with interventional cardiology °On aspirin, Plavix and Praluent °

## 2023-01-14 NOTE — Assessment & Plan Note (Addendum)
BP Readings from Last 1 Encounters:  01/14/23 126/66   Well-controlled with lisinopril and metoprolol Counseled for compliance with the medications Advised DASH diet and moderate exercise/walking

## 2023-01-14 NOTE — Patient Instructions (Addendum)
Please continue to take medications as prescribed. ? ?Please continue to follow low carb diet and perform moderate exercise/walking at least 150 mins/week. ?

## 2023-01-14 NOTE — Assessment & Plan Note (Signed)
[facility] °On Praluent as he did not tolerate statin in the past °

## 2023-01-14 NOTE — Assessment & Plan Note (Signed)
S/p stent placement °Followed by Dr. Berry-cardiology °On aspirin, Plavix and Praluent °On beta-blocker, Imdur and Ranexa °Denies any chest pain currently °

## 2023-01-14 NOTE — Progress Notes (Signed)
Established Patient Office Visit  Subjective:  Patient ID: [name], male    DOB: [birth year]  Age: 77 y.o. MRN: [id number]  CC:  Chief Complaint  Patient presents with   Diabetes   Hypertension    Follow up visit     HPI [name] is a 77 y.o. male with past medical history of HTN, CAD, PAD, type II DM, GERD, HLD and ?Hypothyroidism who presents for f/u of his chronic medical conditions.  CAD, PAD and HTN: He has had cardiac stents and stent in LLE.  He follows up with Dr. Allyson Sabal for it.  He takes aspirin, Plavix and Praluent for it.  He takes lisinopril and metoprolol as well.  He denies any chest pain, dyspnea or palpitations currently.  He is on Imdur and Ranexa for angina.  Type II DM: He is on Metaglip 5-500 mg QD and Rybelsus currently.  His last HbA1c was 6.4 at [facility].  He used to take Metaglip BID in the past, which caused episodes of hypoglycemia.  His blood glucose ranges around 120 most of the time now.  He denies any polyuria or polydipsia currently.   He has stopped taking NP thyroid now.  He was started on NP thyroid even though his TSH was normal by his previous PCP.  He denies any fatigue, recent change in weight or appetite.  Denies any tremors or palpitations currently. His last TSH was wnl at [facility] clinic.   Past Medical History:  Diagnosis Date   CHF (congestive heart failure) (HCC)    Coronary artery disease 08/28/2010   s/p multiple caths 2012, BMS PCI OM1 on August 28, 2010-during NSTEMI; January 2019 non-STEMI- occlusion of OM stent (very late stent thrombosis), initial wire crossed with PTCA, but unable to rewire, PCI aborted--> plan medical therapy, normal EF   Diabetes mellitus    GERD (gastroesophageal reflux disease)    Hyperlipidemia    Hypertension    Non-STEMI (non-ST elevated myocardial infarction) [facility]) January 2012 and 2019   a) Jan 2012: 99% OM1 - BMS PCI; b) Jan 2019: Very late stent thrombosis/100% OM1 -after initially causing him  for repeat PTCA restoring flow, unable to recross to place stent. - >  Medical therapy.   NSTEMI (non-ST elevated myocardial infarction) (HCC)    Admitted 08/29/17 with NSTEMI-Troponin peak 8.8, normal LVF   PVD (peripheral vascular disease) (HCC)    left SFA PTA & stenting in 02/2005 (Dr. Erlene Quan)   Vitamin D deficiency disease 05/04/2019    Past Surgical History:  Procedure Laterality Date   BIOPSY  08/02/2019   Procedure: BIOPSY;  Surgeon: Corbin Ade, MD;  Location: [facility];  Service: Endoscopy;;  gastric    CARDIAC CATHETERIZATION  12/23/2004   normal L main, normal LAD, normal L Cfx, RCA with 20% hypodense lesion in first end of vessel (Dr. Erlene Quan)   CARDIAC CATHETERIZATION  08/26/2007   no significant CAD by cath, EF 50% (Dr. Evlyn Courier)   COLONOSCOPY  12/2009   Dr. Louie Casa   COLONOSCOPY N/A 01/30/2017   pancolonic diverticulosis, non-bleeding internal hemorrhoids.   COLONOSCOPY WITH PROPOFOL N/A 09/08/2019   Procedure: COLONOSCOPY WITH PROPOFOL;  Surgeon: Corbin Ade, MD;  [facility]: [facility];  Service: Endoscopy;  Laterality: N/A;  11:15am   CORONARY BALLOON ANGIOPLASTY N/A 08/30/2017   Procedure: CORONARY BALLOON ANGIOPLASTY;  Surgeon: Runell Gess, MD;  [facility]: [facility];  Service: Cardiovascular;  100% CTO very  late stent thrombosis OM1 -> initially crossed with PTCA, but then unable to recross after losing my positioning.  PTCA ABORTED.  UNSUCCESSFUL ATTEMPT   CORONARY BALLOON ANGIOPLASTY  02/10/2011   95% prox in-stent restenosis within OM stent - opened with cutting balloon (Dr. Bishop Limbo)   CORONARY STENT INTERVENTION  07/17/2011   in-stent restenosis - re-stented with Promus 2.25x6mm DES (Dr. Ranae Palms)   CORONARY STENT INTERVENTION  08/28/2010   NSTEMI: OM1 99% BMS PCI 2.0x70mm MiniVision BMS (Dr. 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 Erlene Quan)   LEFT HEART CATH AND CORONARY ANGIOGRAPHY N/A 08/30/2017   Procedure: LEFT HEART CATH AND CORONARY ANGIOGRAPHY;  Surgeon: Runell Gess, MD;  [facility]: [facility];  Service: Cardiovascular;  100% very late stent thrombosis of Overlapped BMS-DES OM1 -> attempted PTCA   LEFT HEART CATH AND CORONARY ANGIOGRAPHY  08/28/2010   NSTEMI: OM1 99% BMS PCI  (Dr. Erlene Quan)   LEFT HEART CATH AND CORONARY ANGIOGRAPHY  09/11/2010   patent stent (Dr. Ranae Palms)   LEFT HEART CATH AND CORONARY ANGIOGRAPHY  02/10/2011   95% prox in-stent restenosis within OM stent  (Dr. Bishop Limbo)   LEFT HEART CATHETERIZATION WITH CORONARY ANGIOGRAM N/A 07/17/2011   Procedure: LEFT HEART CATHETERIZATION WITH CORONARY ANGIOGRAM;  Surgeon: Marykay Lex, MD;  Location: [facility] CATH LAB;  Service: Cardiovascular;  Laterality: N/A;  Right radial approach;  90% ISR of BMS (5 months post PTCA for ISR) --> DES PCI   left knee arthroscopy  05/2016   NM MYOCAR PERF WALL MOTION  09/08/2013   abnormal lexiscan - low to intermediate risk;    POLYPECTOMY  09/08/2019   Procedure: POLYPECTOMY;  Surgeon: Corbin Ade, MD;  [facility];  Service: Endoscopy;;   TOOTH EXTRACTION Right 06/19/2020   TRANSTHORACIC ECHOCARDIOGRAM  09/01/2017   Normal LV size and function.  EF 66 5%.  Normal wall motion.  GR 1 DD.  Mild aortic sclerosis.  Aortic root mildly dilated at 40 mm.    Family History  Problem Relation Age of Onset   Arrhythmia Mother 57   Early death Son    Colon cancer Neg Hx    Liver disease Neg Hx     Inflammatory bowel disease Neg Hx    Colon polyps Neg Hx     Social History   Socioeconomic History   Marital status: Married    Spouse name: [name]   Number of children: 3   Years of education: 12   Highest education level: Some college, no degree  Occupational History   Occupation: Retired    Associate Professor: [employer]  Tobacco Use   Smoking status: Former    Packs/day: 1.00    Years: 30.00    Additional pack years: 0.00    Total pack years: 30.00    Types: Cigarettes    Quit date: 05/12/1999    Years since quitting: 23.6    Passive exposure: Past   Smokeless tobacco: Never  Vaping Use   Vaping Use:  Never used  Substance and Sexual Activity   Alcohol use: No   Drug use: No   Sexual activity: Yes  Other Topics Concern   Not on file  Social History Narrative   Married for 49 years.Lives with wife.Retired,ex-lab Pensions consultant.Ex-Marine,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   Social Determinants of Health   Financial Resource Strain: Low Risk  (09/15/2022)   Overall Financial Resource Strain (CARDIA)    Difficulty of Paying Living Expenses: Not hard at all  Food Insecurity: No Food Insecurity (09/15/2022)   Hunger Vital Sign    Worried About Running Out of Food in the Last Year: Never true    Ran Out of Food in the Last Year: Never true  Transportation Needs: No Transportation Needs (09/15/2022)   PRAPARE - Administrator, Civil Service (Medical): No    Lack of Transportation (Non-Medical): No  Physical Activity: Insufficiently Active (09/15/2022)   Exercise Vital Sign    Days of Exercise per Week: 3 days    Minutes of Exercise per Session: 30 min  Stress: No Stress Concern Present (09/15/2022)   [employer] of Occupational Health - Occupational Stress Questionnaire    Feeling of Stress : Not at all  Social Connections: Moderately Integrated (09/15/2022)   Social Connection and Isolation Panel [NHANES]    Frequency of Communication with Friends and Family: More than three  times a week    Frequency of Social Gatherings with Friends and Family: More than three times a week    Attends Religious Services: More than 4 times per year    Active Member of [employer] or Organizations: No    Attends Banker Meetings: Never    Marital Status: Married  Catering manager Violence: Not At Risk (09/15/2022)   Humiliation, Afraid, Rape, and Kick questionnaire    Fear of Current or Ex-Partner: No    Emotionally Abused: No    Physically Abused: No    Sexually Abused: No    Outpatient Medications Prior to Visit  Medication Sig Dispense Refill   acetaminophen (TYLENOL) 500 MG tablet Take 1,000 mg by mouth as needed for moderate pain.      Alirocumab (PRALUENT) 75 MG/ML SOAJ Inject 1 Dose into the skin every 14 (fourteen) days.     aspirin 81 MG chewable tablet Chew 1 tablet (81 mg total) by mouth daily.     Cholecalciferol (VITAMIN D3) 50 MCG (2000 UT) TABS Take 3 tablets by mouth daily.     clopidogrel (PLAVIX) 75 MG tablet TAKE ONE TABLET BY MOUTH ONCE DAILY. 30 tablet 9   cyclobenzaprine (FLEXERIL) 10 MG tablet Take 1 tablet by mouth as needed.     dextromethorphan-guaiFENesin (MUCINEX DM) 30-600 MG 12hr tablet Take 1 tablet by mouth as needed.     diclofenac Sodium (VOLTAREN) 1 % GEL Apply topically as needed.     docusate sodium (COLACE) 100 MG capsule Take 100 mg by mouth every other day.     fexofenadine (ALLEGRA) 180 MG tablet Take 180 mg by mouth as needed for allergies or rhinitis. Rotates with Claritin.     glipiZIDE-metformin (METAGLIP) 5-500 MG tablet TAKE (1) TABLET BY MOUTH ONCE DAILY AT 12 NOON. 90 tablet 0   isosorbide mononitrate (IMDUR) 60 MG 24 hr tablet Take 1 tablet (60 mg total) by mouth daily. 90 tablet 3   lisinopril (PRINIVIL,ZESTRIL) 5 MG tablet Take 5 mg by mouth daily.     metoprolol succinate (TOPROL-XL) 25 MG 24 hr tablet TAKE 2 TABLETS  BY MOUTH ONCE DAILY. 180 tablet 0   nitroGLYCERIN (NITROSTAT) 0.4 MG SL tablet DISSOLVE 1 TABLET UNDER  TONGUE EVERY 5 MINUTES UP TO 15 MIN FOR CHEST PAIN. IF NO RELIEF CALL 911. 25 tablet 4   omeprazole (PRILOSEC) 20 MG capsule Take 20 mg by mouth daily.     ONETOUCH ULTRA test strip USE AS DIRECTED UP TO 3 TIMES DAILY IF NEEDED. 100 strip 3   OVER THE COUNTER MEDICATION Stool softner one daily     ranolazine (RANEXA) 500 MG 12 hr tablet Take 500 mg by mouth 2 (two) times daily.     Semaglutide (RYBELSUS) 7 MG TABS Take 1 tablet (7 mg total) by mouth daily. 90 tablet 3   sucralfate (CARAFATE) 1 GM/10ML suspension Take 10 mLs (1 g total) by mouth 4 (four) times daily. 420 mL 1   benzonatate (TESSALON) 200 MG capsule Take 1 capsule by mouth every 8 (eight) hours for cough. 30 capsule 0   thyroid (NP THYROID) 15 MG tablet Take 1 tablet (15 mg total) by mouth daily. (Patient not taking: Reported on 01/14/2023) 90 tablet 1   No facility-administered medications prior to visit.    Allergies  Allergen Reactions   Crestor [Rosuvastatin]     myalgia   Jardiance [Empagliflozin]     Penile swelling and rash   Propoxyphene N-Acetaminophen Nausea Only   Statins Other (See Comments)    Severe muscle cramping/aching/pain/ elevated CK   Tape     Blisters   Zetia [Ezetimibe]     Muscle cramping   Zocor [Simvastatin]     myalgia   Flomax [Tamsulosin Hcl] Rash   Flomax [Tamsulosin] Rash   Levaquin [Levofloxacin Hemihydrate] Rash   Levofloxacin Rash   Penicillin G Diarrhea and Rash   Penicillins Rash    Broke out in rash 6 years ago, pt recently took penicillin (09/2016) and had no reaction Has patient had a PCN reaction causing immediate rash, facial/tongue/throat swelling, SOB or lightheadedness with hypotension: Yes Has patient had a PCN reaction causing severe rash involving mucus membranes or skin necrosis: Unknown Has patient had a PCN reaction that required hospitalization: No Has patient had a PCN reaction occurring within the last 10 years: Yes If all of the above answers are "NO", then m     ROS Review of Systems  Constitutional:  Negative for chills and fever.  HENT:  Negative for congestion and sore throat.   Eyes:  Negative for pain and discharge.  Respiratory:  Negative for cough and shortness of breath.   Cardiovascular:  Negative for chest pain and palpitations.  Gastrointestinal:  Negative for diarrhea, nausea and vomiting.  Endocrine: Negative for polydipsia and polyuria.  Genitourinary:  Negative for dysuria and hematuria.       Nocturia  Musculoskeletal:  Positive for back pain. Negative for neck pain and neck stiffness.  Skin:  Negative for rash.  Neurological:  Negative for dizziness, weakness, numbness and headaches.  Psychiatric/Behavioral:  Negative for agitation and behavioral problems.       Objective:    Physical Exam Vitals reviewed.  Constitutional:      General: He is not in acute distress.    Appearance: He is not diaphoretic.  HENT:     Head: Normocephalic and atraumatic.     Nose: Nose normal.     Mouth/Throat:     Mouth: Mucous membranes are moist.  Eyes:     General: No scleral icterus.    Extraocular Movements: Extraocular  movements intact.  Cardiovascular:     Rate and Rhythm: Normal rate and regular rhythm.     Pulses: Normal pulses.     Heart sounds: Normal heart sounds. No murmur heard. Pulmonary:     Breath sounds: Normal breath sounds. No wheezing or rales.  Abdominal:     Palpations: Abdomen is soft.     Tenderness: There is no abdominal tenderness.  Musculoskeletal:     Cervical back: Neck supple. No tenderness.     Lumbar back: Tenderness (paraspinal) present.     Right lower leg: No edema.     Left lower leg: No edema.  Skin:    General: Skin is warm.     Findings: No rash.  Neurological:     General: No focal deficit present.     Mental Status: He is alert and oriented to person, place, and time.     Sensory: No sensory deficit.     Motor: No weakness.  Psychiatric:        Mood and Affect: Mood normal.         Behavior: Behavior normal.   6  BP 126/66   Pulse 68   Resp 16   Ht 6' (1.829 m)   Wt 213 lb (96.6 kg)   SpO2 95%   BMI 28.89 kg/m  Wt Readings from Last 3 Encounters:  01/14/23 213 lb (96.6 kg)  11/18/22 211 lb 9.6 oz (96 kg)  10/27/22 210 lb 9.6 oz (95.5 kg)    Lab Results  Component Value Date   TSH 0.670 01/14/2023   Lab Results  Component Value Date   WBC 5.7 08/12/2022   HGB 14.8 08/12/2022   HCT 43.5 08/12/2022   MCV 99 (H) 08/12/2022   PLT 169 08/12/2022   Lab Results  Component Value Date   NA 142 01/14/2023   K 3.9 01/14/2023   CO2 26 01/14/2023   GLUCOSE 87 01/14/2023   BUN 16 01/14/2023   CREATININE 1.47 (H) 01/14/2023   BILITOT 1.1 01/14/2023   ALKPHOS 47 01/14/2023   AST 20 01/14/2023   ALT 19 01/14/2023   PROT 6.6 01/14/2023   ALBUMIN 4.0 01/14/2023   CALCIUM 10.0 01/14/2023   ANIONGAP 3 (L) 05/22/2021   EGFR 49 (L) 01/14/2023   Lab Results  Component Value Date   CHOL 159 08/12/2022   Lab Results  Component Value Date   HDL 36 (L) 08/12/2022   Lab Results  Component Value Date   LDLCALC 76 08/12/2022   Lab Results  Component Value Date   TRIG 287 (H) 08/12/2022   Lab Results  Component Value Date   CHOLHDL 4.4 08/12/2022   Lab Results  Component Value Date   HGBA1C 5.7 (H) 01/14/2023      Assessment & Plan:   Problem List Items Addressed This Visit       Cardiovascular and Mediastinum   Peripheral vascular disease (HCC) (Chronic)    Has had stent placed in LLE Follows up with interventional cardiology On aspirin, Plavix and Praluent      CAD S/P percutaneous coronary angioplasty (Chronic)    S/p stent placement Followed by Dr. Sharlett Iles On aspirin, Plavix and Praluent On beta-blocker, Imdur and Ranexa Denies any chest pain currently      DM (diabetes mellitus), type 2 with peripheral vascular complications (HCC) (Chronic)    Lab Results  Component Value Date   HGBA1C 7.5 (H) 08/12/2022   Recently improved to 6.4 at [facility] clinic Well-controlled On  glipizide-metformin 5-500 mg QD currently On Rybelsus 7 mg QD Had given Jardiance for additional cardiac benefit, but had rash and penile swelling, DC Jardiance Advised to follow diabetic diet On statin and ACEi F/u BMP and HbA1C Diabetic eye exam: Advised to follow up with Ophthalmology for diabetic eye exam      Relevant Orders   Hemoglobin A1c (Completed)   CMP14+EGFR (Completed)   Urine Microalbumin w/creat. ratio (Completed)   Essential hypertension (Chronic)    BP Readings from Last 1 Encounters:  01/14/23 126/66  Well-controlled with lisinopril and metoprolol Counseled for compliance with the medications Advised DASH diet and moderate exercise/walking        Endocrine   Hypothyroidism    Was started on NP thyroid by previous PCP even though his TSH was wnl Checked TSH and free T4 - DC NP thyroid to 15 mg QD      Relevant Orders   TSH + free T4 (Completed)     Genitourinary   Stage 3a chronic kidney disease (HCC) - Primary    Last BMP showed GFR of 55, has been around 55 lately Maintain adequate hydration On ACEi Avoid nephrotoxic agents Check BMP and urine microalbumin/creatinine ratio        Other   Hyperlipidemia with target LDL less than 70 (Chronic)    Lipid profile reviewed from [place] chart On Praluent as he did not tolerate statin in the past      Benign prostatic hyperplasia with urinary frequency    Likely has BPH, has urinary frequency - if persistent, consider Flomax      No orders of the defined types were placed in this encounter.    Follow-up: Return in about 4 months (around 05/16/2023) for DM and HTN.    Anabel Halon, MD

## 2023-01-14 NOTE — Assessment & Plan Note (Signed)
Last BMP showed GFR of 55, has been around 55 lately Maintain adequate hydration On ACEi Avoid nephrotoxic agents Check BMP and urine microalbumin/creatinine ratio

## 2023-01-14 NOTE — Assessment & Plan Note (Signed)
Likely has BPH, has urinary frequency - if persistent, consider Flomax

## 2023-01-16 LAB — HEMOGLOBIN A1C
Est. average glucose Bld gHb Est-mCnc: 117 mg/dL
Hgb A1c MFr Bld: 5.7 % — ABNORMAL HIGH (ref 4.8–5.6)

## 2023-01-16 LAB — MICROALBUMIN / CREATININE URINE RATIO
Creatinine, Urine: 194.8 mg/dL
Microalb/Creat Ratio: 5 mg/g creat (ref 0–29)
Microalbumin, Urine: 8.9 ug/mL

## 2023-01-16 LAB — CMP14+EGFR
ALT: 19 IU/L (ref 0–44)
AST: 20 IU/L (ref 0–40)
Albumin/Globulin Ratio: 1.5 (ref 1.2–2.2)
Albumin: 4 g/dL (ref 3.8–4.8)
Alkaline Phosphatase: 47 IU/L (ref 44–121)
BUN/Creatinine Ratio: 11 (ref 10–24)
BUN: 16 mg/dL (ref 8–27)
Bilirubin Total: 1.1 mg/dL (ref 0.0–1.2)
CO2: 26 mmol/L (ref 20–29)
Calcium: 10 mg/dL (ref 8.6–10.2)
Chloride: 106 mmol/L (ref 96–106)
Creatinine, Ser: 1.47 mg/dL — ABNORMAL HIGH (ref 0.76–1.27)
Globulin, Total: 2.6 g/dL (ref 1.5–4.5)
Glucose: 87 mg/dL (ref 70–99)
Potassium: 3.9 mmol/L (ref 3.5–5.2)
Sodium: 142 mmol/L (ref 134–144)
Total Protein: 6.6 g/dL (ref 6.0–8.5)
eGFR: 49 mL/min/{1.73_m2} — ABNORMAL LOW (ref >59)

## 2023-01-16 LAB — TSH+FREE T4
Free T4: 1.05 ng/dL (ref 0.82–1.77)
TSH: 0.67 u[IU]/mL (ref 0.450–4.500)

## 2023-01-16 NOTE — Assessment & Plan Note (Signed)
Was started on NP thyroid by previous PCP even though his TSH was wnl Checked TSH and free T4 - DC NP thyroid to 15 mg QD

## 2023-01-17 IMAGING — DX DG RIBS W/ CHEST 3+V*R*
4 series · 4 of 4 positions shown · non-contrast
Comparison: 08/29/2017

CLINICAL DATA: Recent fall with right-sided chest pain, initial
encounter

EXAM:
RIGHT RIBS AND CHEST - 3+ VIEW

[chest pa]
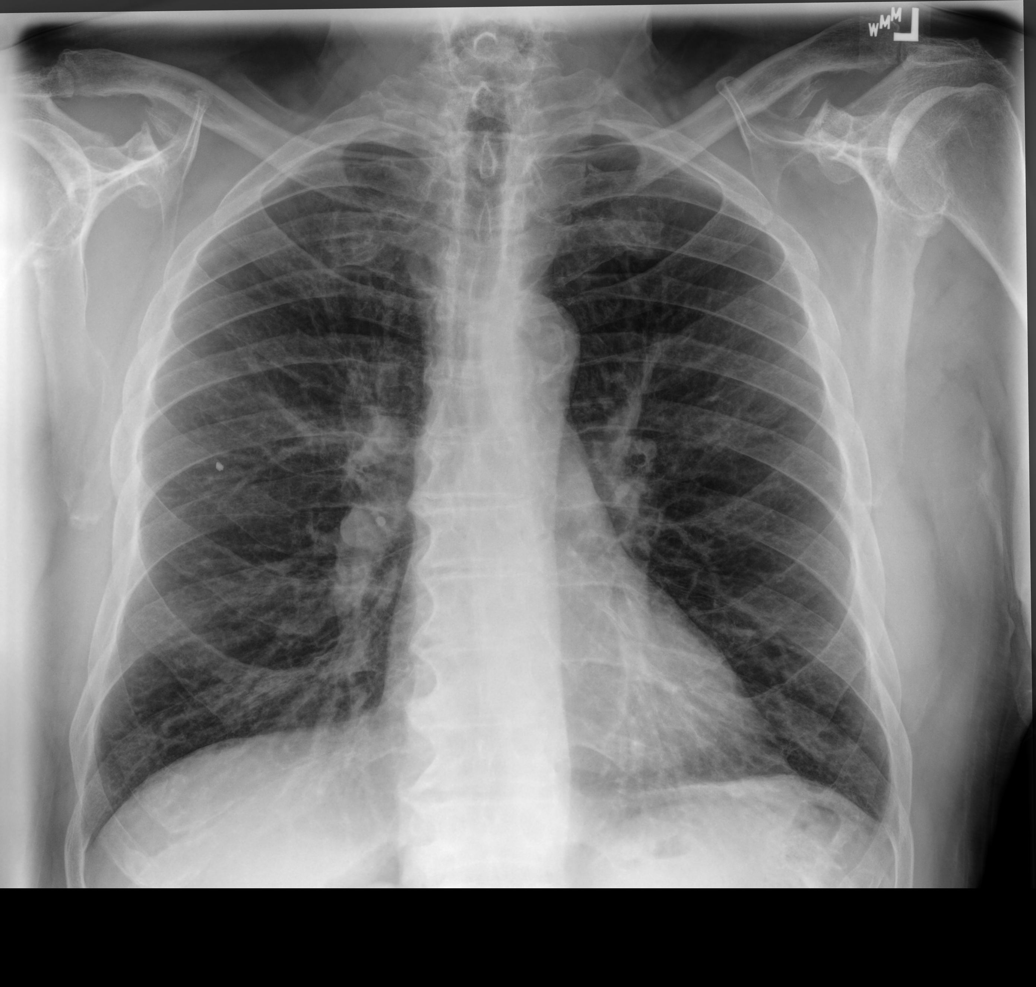

[hemithorax (ribs) ap]
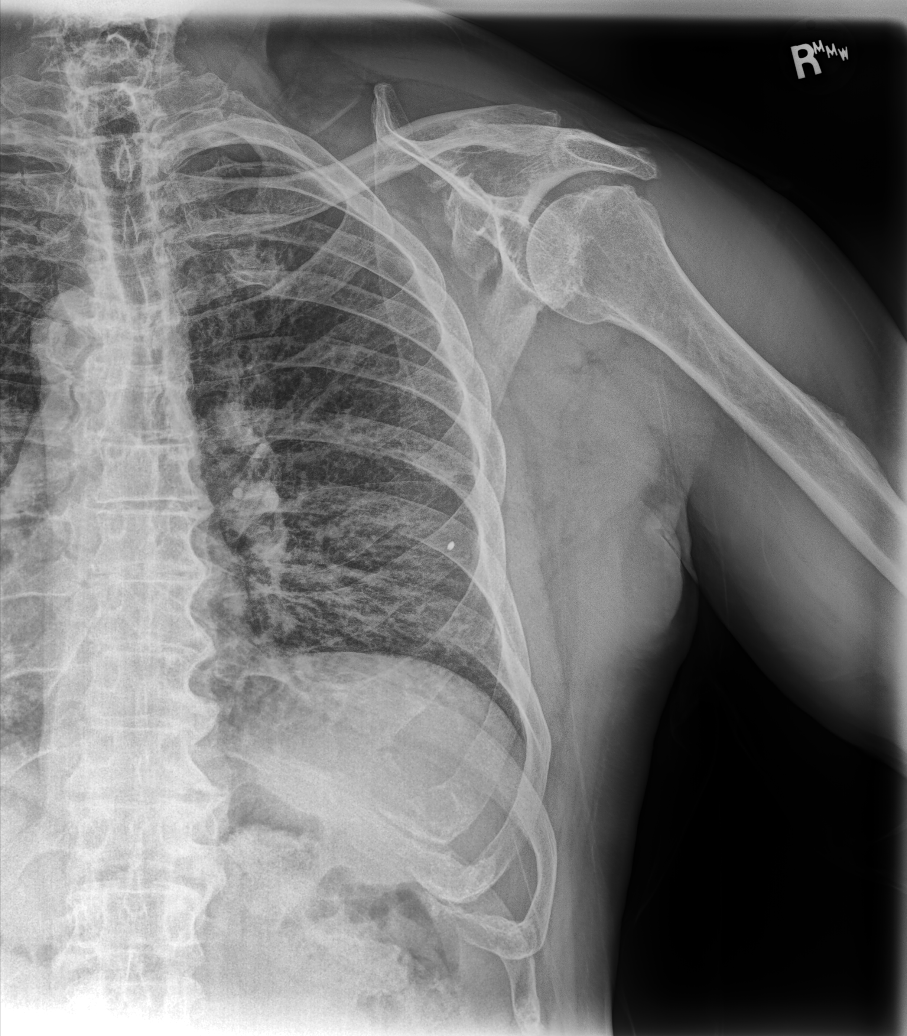

[hemithorax (ribs) mlo (1 of 2)]
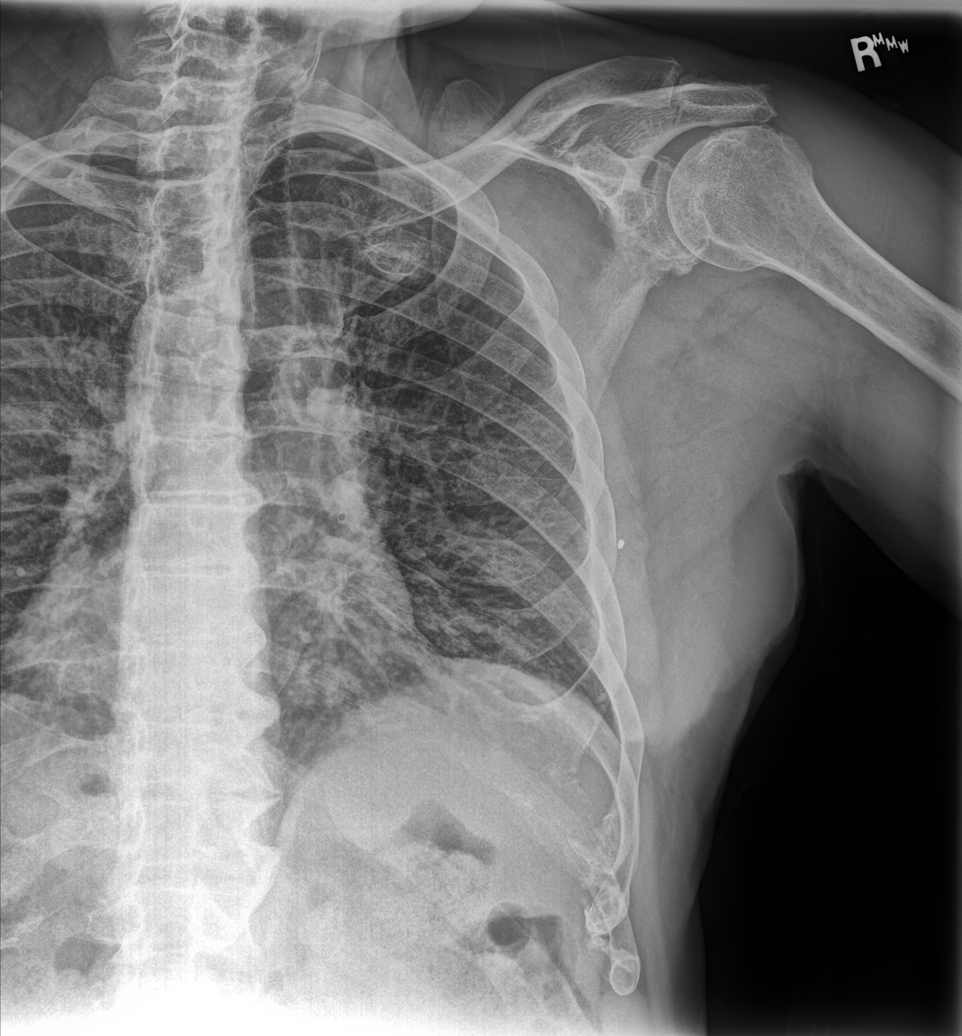

[hemithorax (ribs) mlo (2 of 2)]
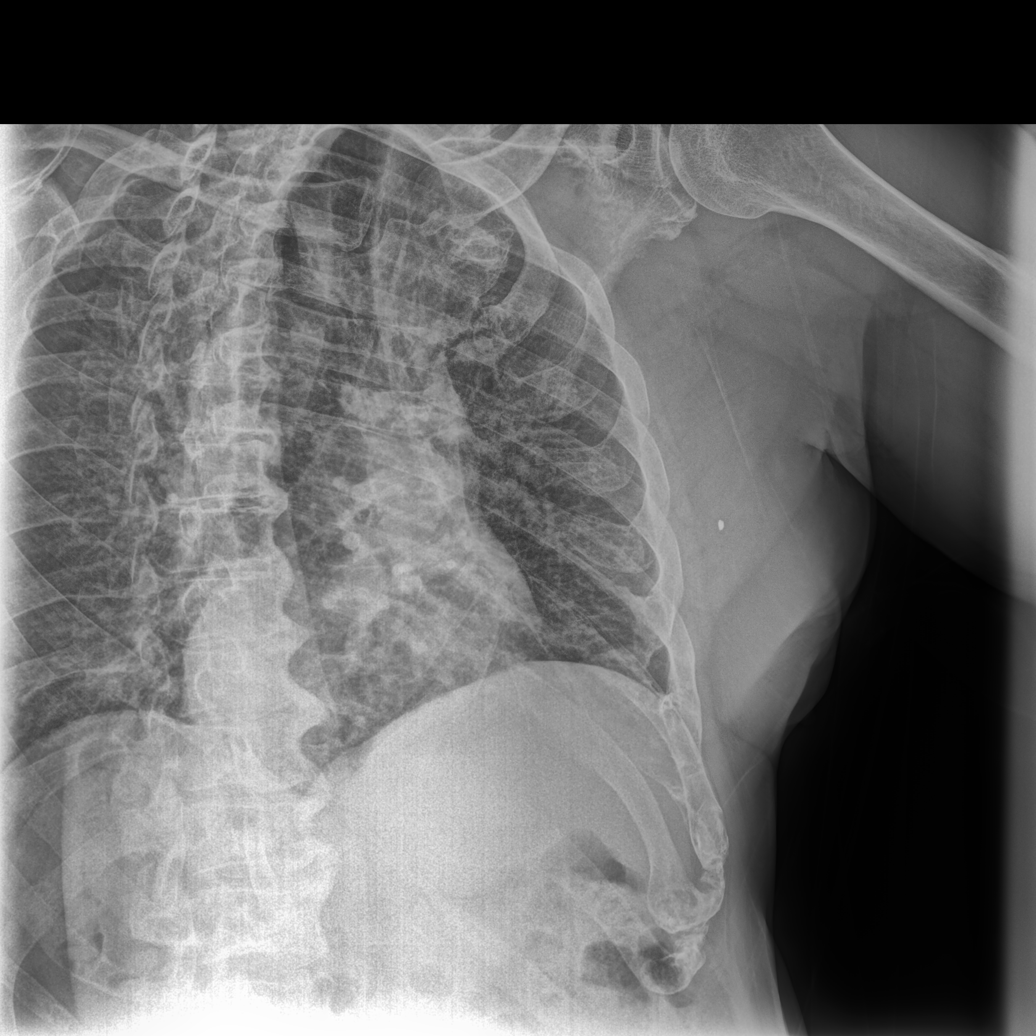

[4 of 4 positions shown; findings below may reference images not displayed]

FINDINGS: Cardiac shadow is within normal limits. Mild aortic calcifications
are seen. The lungs are clear bilaterally. No pneumothorax is noted.
Small radiopaque foreign body is noted in the anterior chest wall
soft tissues stable from a prior exam dated 08/29/2017. No rib
fracture is seen.
IMPRESSION: No evidence of acute rib fracture.

## 2023-02-10 ENCOUNTER — Ambulatory Visit: Payer: PPO | Admitting: Cardiovascular Disease

## 2023-02-23 ENCOUNTER — Other Ambulatory Visit: Payer: Self-pay | Admitting: Cardiovascular Disease

## 2023-03-25 ENCOUNTER — Other Ambulatory Visit: Payer: Self-pay | Admitting: Internal Medicine

## 2023-03-25 DIAGNOSIS — E1151 Type 2 diabetes mellitus with diabetic peripheral angiopathy without gangrene: Secondary | ICD-10-CM

## 2023-04-13 ENCOUNTER — Ambulatory Visit
Admission: EM | Admit: 2023-04-13 | Discharge: 2023-04-13 | Disposition: A | Discharge status: Home / Self Care | Payer: PPO | Attending: Family Medicine | Admitting: Family Medicine

## 2023-04-13 DIAGNOSIS — S61412A Laceration without foreign body of left hand, initial encounter: Secondary | ICD-10-CM

## 2023-04-13 MED ORDER — LIDOCAINE-EPINEPHRINE-TETRACAINE (LET) TOPICAL GEL
3.0000 mL | Freq: Once | TOPICAL | Status: AC
  Administered 2023-04-13: 3 mL via TOPICAL

## 2023-04-13 NOTE — Discharge Instructions (Signed)
Allow the glue to peel off on its own time.  Once off, clean with gentle soap and water about twice a day, apply Neosporin and keep the area covered with a bandage at all times until fully healed.  Follow-up if you are noticing that is becoming red, swollen, having thick drainage or any other worsening symptoms.

## 2023-04-13 NOTE — ED Provider Notes (Signed)
[facility]    CSN: [id number] Arrival date & time: 04/13/23  1235      History   Chief Complaint No chief complaint on file.   HPI [name] is a 77 y.o. male.   Patient presenting today with a laceration to the left hand that occurred about 15 minutes prior to arrival.  He states he was stretching a wire and the wire hit his hand and cut into it.  He denies any foreign body retained, decreased range of motion, numbness, tingling, uncontrolled bleeding though did have quite a bit of bleeding initially due to being on anticoagulation.  So far not tried anything over-the-counter, came straight here.  Last tetanus was in 2021.    Past Medical History:  Diagnosis Date   CHF (congestive heart failure) (HCC)    Coronary artery disease 08/28/2010   s/p multiple caths 2012, BMS PCI OM1 on August 28, 2010-during NSTEMI; January 2019 non-STEMI- occlusion of OM stent (very late stent thrombosis), initial wire crossed with PTCA, but unable to rewire, PCI aborted--> plan medical therapy, normal EF   Diabetes mellitus    GERD (gastroesophageal reflux disease)    Hyperlipidemia    Hypertension    Non-STEMI (non-ST elevated myocardial infarction) [facility]) January 2012 and 2019   a) Jan 2012: 99% OM1 - BMS PCI; b) Jan 2019: Very late stent thrombosis/100% OM1 -after initially causing him for repeat PTCA restoring flow, unable to recross to place stent. - >  Medical therapy.   NSTEMI (non-ST elevated myocardial infarction) (HCC)    Admitted 08/29/17 with NSTEMI-Troponin peak 8.8, normal LVF   PVD (peripheral vascular disease) (HCC)    left SFA PTA & stenting in 02/2005 (Dr. 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   Constipation 08/24/2019   IDA (iron deficiency anemia) 05/26/2019   Vitamin D deficiency disease 05/04/2019   GERD (gastroesophageal reflux disease) 01/09/2017   Hepatomegaly 11/08/2014   Essential hypertension 09/07/2013   CAD S/P percutaneous coronary angioplasty 07/16/2011   DM (diabetes mellitus), type 2 with peripheral vascular complications (HCC) 07/16/2011   Hyperlipidemia with target LDL less than 70 07/16/2011   Peripheral vascular disease (HCC) 01/10/2010    Past Surgical History:  Procedure Laterality Date   BIOPSY  08/02/2019   Procedure: BIOPSY;  Surgeon: Corbin Ade, MD;  Location: [facility];  Service: Endoscopy;;  gastric    CARDIAC CATHETERIZATION  12/23/2004   normal L main, normal LAD, normal L Cfx, RCA with 20% hypodense lesion in first end of vessel (Dr. Erlene Quan)   CARDIAC CATHETERIZATION  08/26/2007   no significant CAD by cath, EF 50% (Dr. Evlyn Courier)   COLONOSCOPY  12/2009   Dr. Louie Casa   COLONOSCOPY N/A 01/30/2017   pancolonic diverticulosis, non-bleeding internal hemorrhoids.   COLONOSCOPY WITH PROPOFOL N/A 09/08/2019   Procedure: COLONOSCOPY WITH PROPOFOL;  Surgeon: Corbin Ade, MD;  Location: [facility];  Service: Endoscopy;  Laterality: N/A;  11:15am   CORONARY BALLOON ANGIOPLASTY N/A 08/30/2017   Procedure: CORONARY BALLOON  ANGIOPLASTY;  Surgeon: Runell Gess, MD;  Location: [facility] INVASIVE CV LAB;  Service: Cardiovascular;  100% CTO very late stent thrombosis OM1 -> initially crossed with PTCA, but then unable to recross after losing my positioning.  PTCA ABORTED.  UNSUCCESSFUL ATTEMPT   CORONARY BALLOON ANGIOPLASTY  02/10/2011   95% prox in-stent restenosis within OM stent - opened with cutting balloon (Dr. Bishop Limbo)   CORONARY  STENT INTERVENTION  07/17/2011   in-stent restenosis - re-stented with Promus 2.25x73mm DES (Dr. Ranae Palms)   CORONARY STENT INTERVENTION  08/28/2010   NSTEMI: OM1 99% BMS PCI 2.0x61mm MiniVision BMS (Dr. 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 Erlene Quan)   LEFT HEART CATH AND CORONARY ANGIOGRAPHY N/A 08/30/2017   Procedure: LEFT HEART CATH AND CORONARY ANGIOGRAPHY;  Surgeon: Runell Gess, MD;  Location: [facility] INVASIVE CV LAB;  Service: Cardiovascular;  100% very late stent thrombosis of Overlapped BMS-DES OM1 -> attempted PTCA   LEFT HEART CATH AND CORONARY ANGIOGRAPHY  08/28/2010   NSTEMI: OM1 99% BMS PCI  (Dr. Erlene Quan)   LEFT HEART CATH AND CORONARY ANGIOGRAPHY  09/11/2010   patent stent (Dr. Ranae Palms)   LEFT HEART CATH AND CORONARY ANGIOGRAPHY  02/10/2011   95% prox in-stent restenosis within OM stent  (Dr. Bishop Limbo)   LEFT HEART CATHETERIZATION WITH CORONARY ANGIOGRAM N/A 07/17/2011   Procedure: LEFT HEART CATHETERIZATION WITH CORONARY ANGIOGRAM;  Surgeon: Marykay Lex, MD;  Location: [facility] CATH LAB;  Service: Cardiovascular;  Laterality: N/A;  Right radial approach;  90% ISR of BMS (5 months post PTCA for ISR) --> DES PCI   left knee arthroscopy  05/2016   NM MYOCAR PERF WALL MOTION  09/08/2013   abnormal lexiscan - low to intermediate risk;    POLYPECTOMY  09/08/2019   Procedure: POLYPECTOMY;  Surgeon: Corbin Ade, MD;  Location: [facility];  Service: Endoscopy;;   TOOTH EXTRACTION  Right 06/19/2020   TRANSTHORACIC ECHOCARDIOGRAM  09/01/2017   Normal LV size and function.  EF 66 5%.  Normal wall motion.  GR 1 DD.  Mild aortic sclerosis.  Aortic root mildly dilated at 40 mm.       Home Medications    Prior to Admission medications   Medication Sig Start Date End Date Taking? Authorizing Provider  acetaminophen (TYLENOL) 500 MG tablet Take 1,000 mg by mouth as needed for moderate pain.     [provider]  Alirocumab (PRALUENT) 75 MG/ML SOAJ Inject 1 Dose into the skin every 14 (fourteen) days.    [provider]  aspirin 81 MG chewable tablet Chew 1 tablet (81 mg total) by mouth daily. 09/03/17   Arty Baumgartner, NP  Cholecalciferol (VITAMIN D3) 50 MCG (2000 UT) TABS Take 3 tablets by mouth daily.    [provider]  clopidogrel (PLAVIX) 75 MG tablet TAKE ONE TABLET BY MOUTH ONCE DAILY. 12/07/15   Runell Gess, MD  cyclobenzaprine (FLEXERIL) 10 MG tablet Take 1 tablet by mouth as needed. 09/07/20   [provider]  dextromethorphan-guaiFENesin (MUCINEX DM) 30-600 MG 12hr tablet Take 1 tablet by mouth as needed.    [provider]  diclofenac Sodium (VOLTAREN) 1 % GEL Apply topically as needed. 09/07/20   [provider]  docusate sodium (COLACE) 100 MG capsule Take 100 mg by mouth every other day.    [provider]  fexofenadine (ALLEGRA) 180 MG tablet Take 180 mg by mouth as needed for allergies or rhinitis. Rotates with Claritin.    [provider]  glipiZIDE-metformin (METAGLIP) 5-500 MG tablet TAKE (1) TABLET BY MOUTH ONCE DAILY AT 12 NOON. 03/25/23   Anabel Halon, MD  isosorbide mononitrate (IMDUR) 60 MG 24 hr tablet Take 1 tablet (60 mg total) by mouth daily. 06/21/19   Runell Gess, MD  lisinopril (PRINIVIL,ZESTRIL) 5 MG tablet Take 5 mg by mouth daily.    [provider]  metoprolol succinate (TOPROL-XL) 25 MG 24 hr tablet TAKE 2 TABLETS BY MOUTH ONCE DAILY. 02/23/23    Runell Gess, MD  nitroGLYCERIN (NITROSTAT) 0.4 MG SL tablet DISSOLVE 1 TABLET UNDER TONGUE EVERY 5 MINUTES UP TO 15 MIN FOR CHEST PAIN. IF NO RELIEF CALL 911. 10/27/22   Runell Gess, MD  omeprazole (PRILOSEC) 20 MG capsule Take 20 mg by mouth daily.    [provider]  ONETOUCH ULTRA test strip USE AS DIRECTED UP TO 3 TIMES DAILY IF NEEDED. 03/28/20   Wilson Singer, MD  OVER THE COUNTER MEDICATION Stool softner one daily    [provider]  ranolazine (RANEXA) 500 MG 12 hr tablet Take 500 mg by mouth 2 (two) times daily.    [provider]  Semaglutide (RYBELSUS) 7 MG TABS Take 1 tablet (7 mg total) by mouth daily. 09/10/22   Anabel Halon, MD  sucralfate (CARAFATE) 1 GM/10ML suspension Take 10 mLs (1 g total) by mouth 4 (four) times daily. 07/02/22   Gelene Mink, NP    Family History Family History  Problem Relation Age of Onset   Arrhythmia Mother 72   Early death Son    Colon cancer Neg Hx    Liver disease Neg Hx    Inflammatory bowel disease Neg Hx    Colon polyps Neg Hx     Social History Social History   Tobacco Use   Smoking status: Former    Current packs/day: 0.00    Average packs/day: 1 pack/day for 30.0 years (30.0 ttl pk-yrs)    Types: Cigarettes    Start date: 05/11/1969    Quit date: 05/12/1999    Years since quitting: 23.9    Passive exposure: Past   Smokeless tobacco: Never  Vaping Use   Vaping status: Never Used  Substance Use Topics   Alcohol use: No   Drug use: No   Allergies   Crestor [rosuvastatin], Jardiance [empagliflozin], Propoxyphene n-acetaminophen, Statins, Tape, Zetia [ezetimibe], Zocor [simvastatin], Flomax [tamsulosin hcl], Flomax [tamsulosin], Levaquin [levofloxacin hemihydrate], Levofloxacin, Penicillin g, and Penicillins  Review of Systems Review of Systems PER HPI  Physical Exam Triage Vital Signs ED Triage Vitals  Encounter Vitals Group     BP 04/13/23 1353 (!) 143/77     Systolic BP  Percentile --      Diastolic BP Percentile --      Pulse Rate 04/13/23 1353 (!) 56     Resp 04/13/23 1353 18     Temp 04/13/23 1353 98 F (36.7 C)     Temp Source 04/13/23 1353 Oral  SpO2 04/13/23 1353 96 %     Weight --      Height --      Head Circumference --      Peak Flow --      Pain Score 04/13/23 1354 2     Pain Loc --      Pain Education --      Exclude from Growth Chart --    No data found.  Updated Vital Signs BP (!) 143/77 (BP Location: Right Arm)   Pulse (!) 56   Temp 98 F (36.7 C) (Oral)   Resp 18   SpO2 96%   Visual Acuity Right Eye Distance:   Left Eye Distance:   Bilateral Distance:    Right Eye Near:   Left Eye Near:    Bilateral Near:     Physical Exam Vitals and nursing note reviewed.  Constitutional:      Appearance: Normal appearance.  HENT:     Head: Atraumatic.  Eyes:     Extraocular Movements: Extraocular movements intact.     Conjunctiva/sclera: Conjunctivae normal.  Cardiovascular:     Rate and Rhythm: Normal rate and regular rhythm.  Pulmonary:     Effort: Pulmonary effort is normal.     Breath sounds: Normal breath sounds.  Musculoskeletal:        General: Normal range of motion.     Cervical back: Normal range of motion and neck supple.  Skin:    General: Skin is warm.     Comments: 2 cm well-approximated superficial laceration to the thenar aspect of the palmar surface of the left hand.  Bleeding well-controlled no foreign body appreciable  Neurological:     General: No focal deficit present.     Mental Status: He is oriented to person, place, and time.     Motor: No weakness.     Gait: Gait normal.     Comments: Left upper extremity neurovascularly intact  Psychiatric:        Mood and Affect: Mood normal.        Thought Content: Thought content normal.        Judgment: Judgment normal.      UC Treatments / Results  Labs (all labs ordered are listed, but only abnormal results are displayed) Labs Reviewed - No  data to display  EKG   Radiology No results found.  Procedures Laceration Repair  Date/Time: 04/13/2023 3:24 PM  Performed by: Particia Nearing, PA-C Authorized by: Particia Nearing, PA-C   Consent:    Consent obtained:  Verbal   Consent given by:  Patient   Risks, benefits, and alternatives were discussed: yes     Risks discussed:  Infection and pain   Alternatives discussed:  Observation Universal protocol:    Procedure explained and questions answered to patient or proxy's satisfaction: yes     Relevant documents present and verified: yes     Patient identity confirmed:  Verbally with patient and arm band Anesthesia:    Anesthesia method:  Topical application   Topical anesthetic:  LET Laceration details:    Location:  Hand   Hand location:  L palm   Length (cm):  2   Depth (mm):  1 Pre-procedure details:    Preparation:  Patient was prepped and draped in usual sterile fashion Exploration:    Limited defect created (wound extended): no     Hemostasis achieved with:  Direct pressure and LET   Wound exploration: wound explored through full  range of motion     Contaminated: no   Treatment:    Area cleansed with:  Chlorhexidine   Amount of cleaning:  Standard   Irrigation solution:  Sterile saline   Irrigation method:  Pressure wash   Visualized foreign bodies/material removed: no     Debridement:  None Skin repair:    Repair method:  Tissue adhesive Approximation:    Approximation:  Close Repair type:    Repair type:  Simple Post-procedure details:    Dressing:  Non-adherent dressing   Procedure completion:  Tolerated well, no immediate complications  (including critical care time)  Medications Ordered in UC Medications  lidocaine-EPINEPHrine-tetracaine (LET) topical gel (3 mLs Topical Given 04/13/23 1248)    Initial Impression / Assessment and Plan / UC Course  I have reviewed the triage vital signs and the nursing notes.  Pertinent labs &  imaging results that were available during my care of the patient were reviewed by me and considered in my medical decision making (see chart for details).     Tdap up-to-date, wound cleaned thoroughly, let gel applied for bleeding control and pain relief and Dermabond applied for closure.  Discussed home wound care and return precautions.  Final Clinical Impressions(s) / UC Diagnoses   Final diagnoses:  Laceration of left hand without foreign body, initial encounter     Discharge Instructions      Allow the glue to peel off on its own time.  Once off, clean with gentle soap and water about twice a day, apply Neosporin and keep the area covered with a bandage at all times until fully healed.  Follow-up if you are noticing that is becoming red, swollen, having thick drainage or any other worsening symptoms.    ED Prescriptions   None    PDMP not reviewed this encounter.   Particia Nearing, [place] 04/13/23 1526

## 2023-04-13 NOTE — ED Triage Notes (Signed)
Pt reports he cut his left palm on a wire from an iron earlier today.  Last tetanus 2021

## 2023-05-04 ENCOUNTER — Encounter (HOSPITAL_COMMUNITY): Payer: Self-pay | Admitting: Emergency Medicine

## 2023-05-04 ENCOUNTER — Emergency Department (HOSPITAL_COMMUNITY)
Admission: EM | Admit: 2023-05-04 | Discharge: 2023-05-04 | Disposition: A | Discharge status: Home / Self Care | Payer: PPO | Attending: Emergency Medicine | Admitting: Emergency Medicine

## 2023-05-04 ENCOUNTER — Emergency Department (HOSPITAL_COMMUNITY): Payer: PPO

## 2023-05-04 ENCOUNTER — Other Ambulatory Visit: Payer: Self-pay

## 2023-05-04 DIAGNOSIS — R109 Unspecified abdominal pain: Secondary | ICD-10-CM | POA: Diagnosis not present

## 2023-05-04 DIAGNOSIS — Z7982 Long term (current) use of aspirin: Secondary | ICD-10-CM | POA: Insufficient documentation

## 2023-05-04 DIAGNOSIS — M545 Low back pain, unspecified: Secondary | ICD-10-CM | POA: Insufficient documentation

## 2023-05-04 DIAGNOSIS — Z7902 Long term (current) use of antithrombotics/antiplatelets: Secondary | ICD-10-CM | POA: Diagnosis not present

## 2023-05-04 LAB — URINALYSIS, ROUTINE W REFLEX MICROSCOPIC
Bilirubin Urine: NEGATIVE
Glucose, UA: NEGATIVE mg/dL
Hgb urine dipstick: NEGATIVE
Ketones, ur: NEGATIVE mg/dL
Leukocytes,Ua: NEGATIVE
Nitrite: NEGATIVE
Protein, ur: NEGATIVE mg/dL
Specific Gravity, Urine: 1.019 (ref 1.005–1.030)
pH: 6 (ref 5.0–8.0)

## 2023-05-04 LAB — COMPREHENSIVE METABOLIC PANEL
ALT: 17 U/L (ref 0–44)
AST: 18 U/L (ref 15–41)
Albumin: 3.9 g/dL (ref 3.5–5.0)
Alkaline Phosphatase: 32 U/L — ABNORMAL LOW (ref 38–126)
Anion gap: 7 (ref 5–15)
BUN: 15 mg/dL (ref 8–23)
CO2: 26 mmol/L (ref 22–32)
Calcium: 9.7 mg/dL (ref 8.9–10.3)
Chloride: 108 mmol/L (ref 98–111)
Creatinine, Ser: 1.22 mg/dL (ref 0.61–1.24)
GFR, Estimated: >60 mL/min (ref >60)
Glucose, Bld: 92 mg/dL (ref 70–99)
Potassium: 3.8 mmol/L (ref 3.5–5.1)
Sodium: 141 mmol/L (ref 135–145)
Total Bilirubin: 1.7 mg/dL — ABNORMAL HIGH (ref 0.3–1.2)
Total Protein: 6.9 g/dL (ref 6.5–8.1)

## 2023-05-04 LAB — CBC WITH DIFFERENTIAL/PLATELET
Abs Immature Granulocytes: 0.02 10*3/uL (ref 0.00–0.07)
Basophils Absolute: 0 10*3/uL (ref 0.0–0.1)
Basophils Relative: 1 %
Eosinophils Absolute: 0.1 10*3/uL (ref 0.0–0.5)
Eosinophils Relative: 1 %
HCT: 38.6 % — ABNORMAL LOW (ref 39.0–52.0)
Hemoglobin: 12.9 g/dL — ABNORMAL LOW (ref 13.0–17.0)
Immature Granulocytes: 0 %
Lymphocytes Relative: 36 %
Lymphs Abs: 2.3 10*3/uL (ref 0.7–4.0)
MCH: 33.6 pg (ref 26.0–34.0)
MCHC: 33.4 g/dL (ref 30.0–36.0)
MCV: 100.5 fL — ABNORMAL HIGH (ref 80.0–100.0)
Monocytes Absolute: 0.4 10*3/uL (ref 0.1–1.0)
Monocytes Relative: 6 %
Neutro Abs: 3.7 10*3/uL (ref 1.7–7.7)
Neutrophils Relative %: 56 %
Platelets: 169 10*3/uL (ref 150–400)
RBC: 3.84 MIL/uL — ABNORMAL LOW (ref 4.22–5.81)
RDW: 12.6 % (ref 11.5–15.5)
WBC: 6.6 10*3/uL (ref 4.0–10.5)
nRBC: 0 % (ref 0.0–0.2)

## 2023-05-04 MED ORDER — METHOCARBAMOL 500 MG PO TABS: 500.0000 mg | ORAL_TABLET | Freq: Three times a day (TID) | ORAL | 0 refills | Status: DC | PRN

## 2023-05-04 MED ORDER — HYDROCODONE-ACETAMINOPHEN 5-325 MG PO TABS: ORAL_TABLET | ORAL | 0 refills | Status: DC

## 2023-05-04 NOTE — Discharge Instructions (Signed)
Alternate ice and heat to your lower back.  Avoid twisting bending or heavy lifting for at least 1 week.  Do not take any additional Tylenol while taking the prescribed pain medication  The pain medication and muscle relaxer you have been prescribed may cause drowsiness, so please do not operate machinery or drive while taking these medications.  Please follow-up with your primary care provider for recheck

## 2023-05-04 NOTE — ED Provider Notes (Signed)
[facility] EMERGENCY DEPARTMENT AT [facility] Provider Note   CSN: [id number] Arrival date & time: 05/04/23  1131     History  Chief Complaint  Patient presents with   Back Pain    [name] is a 77 y.o. male.   Back Pain Associated symptoms: no abdominal pain, no chest pain, no dysuria, no fever, no numbness and no weakness        [name] is a 77 y.o. male who presents to the Emergency Department complaining of right-sided low back pain x 1 week.  Describes a sharp aching pain of his right flank and right lower back.  Pain worsens with standing and walking, deep breathing and certain movements.  Pain improves at rest.  He states the pain worsens if he has been lying down or sitting for an extended period of time and attempts to stand up.  He is unable to stand or walk erect due to pain.  Denies any pain numbness or weakness of his lower extremities, abdominal pain, fever, chills, dysuria, urine or bowel changes.  Denies known injury  Home Medications Prior to Admission medications   Medication Sig Start Date End Date Taking? Authorizing Provider  acetaminophen (TYLENOL) 500 MG tablet Take 1,000 mg by mouth as needed for moderate pain.     [provider]  Alirocumab (PRALUENT) 75 MG/ML SOAJ Inject 1 Dose into the skin every 14 (fourteen) days.    [provider]  aspirin 81 MG chewable tablet Chew 1 tablet (81 mg total) by mouth daily. 09/03/17   Arty Baumgartner, NP  Cholecalciferol (VITAMIN D3) 50 MCG (2000 UT) TABS Take 3 tablets by mouth daily.    [provider]  clopidogrel (PLAVIX) 75 MG tablet TAKE ONE TABLET BY MOUTH ONCE DAILY. 12/07/15   Runell Gess, MD  cyclobenzaprine (FLEXERIL) 10 MG tablet Take 1 tablet by mouth as needed. 09/07/20   [provider]  dextromethorphan-guaiFENesin (MUCINEX DM) 30-600 MG 12hr tablet Take 1 tablet by mouth as needed.    [provider]  diclofenac Sodium (VOLTAREN)  1 % GEL Apply topically as needed. 09/07/20   [provider]  docusate sodium (COLACE) 100 MG capsule Take 100 mg by mouth every other day.    [provider]  fexofenadine (ALLEGRA) 180 MG tablet Take 180 mg by mouth as needed for allergies or rhinitis. Rotates with Claritin.    [provider]  glipiZIDE-metformin (METAGLIP) 5-500 MG tablet TAKE (1) TABLET BY MOUTH ONCE DAILY AT 12 NOON. 03/25/23   Anabel Halon, MD  isosorbide mononitrate (IMDUR) 60 MG 24 hr tablet Take 1 tablet (60 mg total) by mouth daily. 06/21/19   Runell Gess, MD  lisinopril (PRINIVIL,ZESTRIL) 5 MG tablet Take 5 mg by mouth daily.    [provider]  metoprolol succinate (TOPROL-XL) 25 MG 24 hr tablet TAKE 2 TABLETS BY MOUTH ONCE DAILY. 02/23/23   Runell Gess, MD  nitroGLYCERIN (NITROSTAT) 0.4 MG SL tablet DISSOLVE 1 TABLET UNDER TONGUE EVERY 5 MINUTES UP TO 15 MIN FOR CHEST PAIN. IF NO RELIEF CALL 911. 10/27/22   Runell Gess, MD  omeprazole (PRILOSEC) 20 MG capsule Take 20 mg by mouth daily.    [provider]  ONETOUCH ULTRA test strip USE AS DIRECTED UP TO 3 TIMES DAILY IF NEEDED. 03/28/20   Wilson Singer, MD  OVER THE COUNTER MEDICATION Stool softner one daily    [provider]  ranolazine (RANEXA) 500 MG 12 hr tablet Take 500 mg by mouth 2 (two) times daily.    [provider]  Semaglutide (RYBELSUS) 7 MG TABS Take 1 tablet (7 mg total) by mouth daily. 09/10/22   Anabel Halon, MD  sucralfate (CARAFATE) 1 GM/10ML suspension Take 10 mLs (1 g total) by mouth 4 (four) times daily. 07/02/22   Gelene Mink, NP      Allergies    Crestor [rosuvastatin], Jardiance [empagliflozin], Propoxyphene n-acetaminophen, Statins, Tape, Zetia [ezetimibe], Zocor [simvastatin], Flomax [tamsulosin hcl], Flomax [tamsulosin], Levaquin [levofloxacin hemihydrate], Levofloxacin, Penicillin g, and Penicillins    Review of Systems   Review of Systems   Constitutional:  Negative for appetite change, chills and fever.  Respiratory:  Negative for shortness of breath.   Cardiovascular:  Negative for chest pain and palpitations.  Gastrointestinal:  Negative for abdominal pain, diarrhea, nausea and vomiting.  Genitourinary:  Positive for flank pain. Negative for difficulty urinating, dysuria, hematuria, penile pain, penile swelling, scrotal swelling and testicular pain.  Musculoskeletal:  Positive for back pain. Negative for neck pain and neck stiffness.  Neurological:  Negative for dizziness, weakness and numbness.    Physical Exam Updated Vital Signs BP (!) 158/84 (BP Location: Right Arm)   Pulse (!) 57   Temp 98 F (36.7 C) (Oral)   Resp 17   SpO2 99%  Physical Exam Vitals and nursing note reviewed.  Constitutional:      General: He is not in acute distress.    Appearance: Normal appearance. He is not toxic-appearing.  Cardiovascular:     Rate and Rhythm: Normal rate and regular rhythm.     Pulses: Normal pulses.  Pulmonary:     Effort: Pulmonary effort is normal.     Breath sounds: Normal breath sounds.  Chest:     Chest wall: No tenderness.  Abdominal:     General: There is no distension.     Palpations: Abdomen is soft.     Tenderness: There is no abdominal tenderness. There is left CVA tenderness. There is no right CVA tenderness.  Musculoskeletal:        General: Tenderness present. No swelling or signs of injury.     Lumbar back: Tenderness present. No deformity or spasms. Positive right straight leg raise test. Negative left straight leg raise test.     Right lower leg: No edema.     Left lower leg: No edema.     Comments: Diffuse tenderness to palpation right lumbar paraspinal muscles.  Patient able to perform range of motion of the hip without difficulty  Skin:    General: Skin is warm.     Capillary Refill: Capillary refill takes less than 2 seconds.  Neurological:     General: No focal deficit present.      Mental Status: He is alert.     Sensory: No sensory deficit.     Motor: No weakness.     ED Results / Procedures / Treatments   Labs (all labs ordered are listed, but only abnormal results are displayed) Labs Reviewed  CBC WITH DIFFERENTIAL/PLATELET - Abnormal; Notable for the following components:      Result Value   RBC 3.84 (*)    Hemoglobin 12.9 (*)    HCT 38.6 (*)    MCV 100.5 (*)    All other components within normal limits  COMPREHENSIVE METABOLIC PANEL - Abnormal; Notable for the following components:   Alkaline Phosphatase 32 (*)    Total Bilirubin  1.7 (*)    All other components within normal limits  URINALYSIS, ROUTINE W REFLEX MICROSCOPIC    EKG None  Radiology CT Renal Stone Study  Result Date: 05/04/2023 CLINICAL DATA:  Acute right flank pain for 1 week. EXAM: CT ABDOMEN AND PELVIS WITHOUT CONTRAST TECHNIQUE: Multidetector CT imaging of the abdomen and pelvis was performed following the standard protocol without IV contrast. RADIATION DOSE REDUCTION: This exam was performed according to the departmental dose-optimization program which includes automated exposure control, adjustment of the mA and/or kV according to patient size and/or use of iterative reconstruction technique. COMPARISON:  August 09, 2008. FINDINGS: Lower chest: No acute abnormality. Hepatobiliary: No focal liver abnormality is seen. No gallstones, gallbladder wall thickening, or biliary dilatation. Pancreas: Unremarkable. No pancreatic ductal dilatation or surrounding inflammatory changes. Spleen: Normal in size without focal abnormality. Adrenals/Urinary Tract: Adrenal glands are unremarkable. Kidneys are normal, without renal calculi, focal lesion, or hydronephrosis. Bladder is unremarkable. Stomach/Bowel: The stomach is unremarkable. There is no evidence of bowel obstruction or inflammation. The appendix is unremarkable. Vascular/Lymphatic: Aortic atherosclerosis. No enlarged abdominal or pelvic lymph  nodes. Reproductive: Mild prostatic enlargement. Other: No abdominal wall hernia or abnormality. No abdominopelvic ascites. Musculoskeletal: No acute or significant osseous findings. IMPRESSION: No acute abnormality seen in the abdomen or pelvis. Aortic Atherosclerosis (ICD10-I70.0). Electronically Signed   By: Lupita Raider M.D.   On: 05/04/2023 16:47   CT L-SPINE NO CHARGE  Result Date: 05/04/2023 CLINICAL DATA:  Right flank pain and right lower back pain. EXAM: CT LUMBAR SPINE WITHOUT CONTRAST TECHNIQUE: Multidetector CT imaging of the lumbar spine was performed without intravenous contrast administration. Multiplanar CT image reconstructions were also generated. RADIATION DOSE REDUCTION: This exam was performed according to the departmental dose-optimization program which includes automated exposure control, adjustment of the mA and/or kV according to patient size and/or use of iterative reconstruction technique. COMPARISON:  None Available. FINDINGS: Segmentation: 5 lumbar type vertebrae. Alignment: Normal. Vertebrae: The bones are mildly osteopenic. There is no acute fracture or dislocation. There is mild disc space narrowing at L3-L4, L4-L5 and L5-S1. Endplate osteophytes are seen at L4-L5. Paraspinal and other soft tissues: Negative. Disc levels: From T12 through L3 there is no significant disc bulge or central canal/neural foraminal stenosis. L3-L4: Broad-based disc bulge and bilateral facet arthropathy with thickening of the ligamentum flavum. Moderate central canal stenosis. No significant neural foraminal stenosis. L4-L5: Broad-based disc bulge with bilateral facet arthropathy. No central canal stenosis. Mild bilateral neural foraminal stenosis. L5-S1: Bilateral facet arthropathy. Moderate bilateral neural foraminal stenosis. No central canal stenosis. Other: There are atherosclerotic calcifications of the aorta. IMPRESSION: 1. No acute fracture or dislocation of the lumbar spine. 2. Multilevel  degenerative changes of the lumbar spine, most prominent at L3-L4. 3. 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 4. Moderate bilateral neural foraminal stenosis at L5-S1 and mild bilateral neural foraminal stenosis at L4-L5. Aortic Atherosclerosis (ICD10-I70.0). Electronically Signed   By: Darliss Cheney M.D.   On: 05/04/2023 16:43    Procedures Procedures    Medications Ordered in ED Medications - No data to display  ED Course/ Medical Decision Making/ A&P                                 Medical Decision Making Patient here with right flank pain and right low back pain.  No known injury.  Pain reproduced with position change.  No history of kidney stones,  fever chills nausea or vomiting.  No urine or bowel changes.  Patient has localized tenderness of the area.  No urinary symptoms, bowel or urine retention or incontinence, no extremity numbness or weakness.  No abdominal pain on my exam.  I suspect musculoskeletal injury versus lumbar strain.  Sciatica, pyelonephritis, kidney stone also considered.  No red flags on my exam that is concerning for cauda equina but this is also considered  Amount and/or Complexity of Data Reviewed Labs: ordered.    Details: Labs interpreted by me, no evidence of significant electrolyte derangement, no leukocytosis, urinalysis without hematuria or infection. Radiology: ordered.    Details: CT renal stone study without evidence of kidney stone.  No charge CT of the lumbar spine shows stenosis at the L3-4 level with multilevel degenerative changes.  No acute bony finding Discussion of management or test interpretation with external provider(s): Patient here with likely low back pain.  No findings suggestive of cauda equina or kidney stone.  Ambulatory without focal neurodeficit.  Will treat symptomatically and he is agreeable to close outpatient follow-up in 1 week if not improving.  Risk Prescription drug management.           Final Clinical  Impression(s) / ED Diagnoses Final diagnoses:  Acute right-sided low back pain without sciatica    Rx / DC Orders ED Discharge Orders     None         Rosey Bath 05/04/23 1722    Terrilee Files, MD 05/04/23 2037

## 2023-05-04 NOTE — ED Triage Notes (Signed)
Pt c/o right flank pain/right lower back/abd pain x 1 week. States goes from hip up. Denies gu sx. Denies shooting down leg. Denies n/v/d. Denies injury

## 2023-05-14 ENCOUNTER — Ambulatory Visit: Payer: PPO | Admitting: Internal Medicine

## 2023-05-14 ENCOUNTER — Encounter: Payer: Self-pay | Admitting: Internal Medicine

## 2023-05-14 VITALS — BP 139/69 | HR 67 | Ht 72.0 in | Wt 209.8 lb

## 2023-05-14 DIAGNOSIS — Z23 Encounter for immunization: Secondary | ICD-10-CM

## 2023-05-14 DIAGNOSIS — Z9861 Coronary angioplasty status: Secondary | ICD-10-CM

## 2023-05-14 DIAGNOSIS — I2510 Atherosclerotic heart disease of native coronary artery without angina pectoris: Secondary | ICD-10-CM

## 2023-05-14 DIAGNOSIS — E785 Hyperlipidemia, unspecified: Secondary | ICD-10-CM

## 2023-05-14 DIAGNOSIS — M545 Low back pain, unspecified: Secondary | ICD-10-CM

## 2023-05-14 DIAGNOSIS — I10 Essential (primary) hypertension: Secondary | ICD-10-CM

## 2023-05-14 DIAGNOSIS — I2089 Other forms of angina pectoris: Secondary | ICD-10-CM

## 2023-05-14 DIAGNOSIS — E1151 Type 2 diabetes mellitus with diabetic peripheral angiopathy without gangrene: Secondary | ICD-10-CM

## 2023-05-14 DIAGNOSIS — N1831 Chronic kidney disease, stage 3a: Secondary | ICD-10-CM | POA: Diagnosis not present

## 2023-05-14 LAB — HEMOGLOBIN A1C: Hemoglobin A1C: 5.5

## 2023-05-14 MED ORDER — CYCLOBENZAPRINE HCL 5 MG PO TABS
5.0000 mg | ORAL_TABLET | Freq: Every evening | ORAL | 1 refills | Status: DC | PRN
Start: 2023-05-14 — End: 2023-05-26

## 2023-05-14 MED ORDER — PREDNISONE 20 MG PO TABS: 20.0000 mg | ORAL_TABLET | Freq: Every day | ORAL | 0 refills | Status: DC

## 2023-05-14 NOTE — Assessment & Plan Note (Signed)
[facility] On Praluent as he did not tolerate statin in the past 

## 2023-05-14 NOTE — Assessment & Plan Note (Signed)
On Ranexa and Imdur - denies chest pain episodes now Has Nitroglycerin PRN Followed by Cardiology

## 2023-05-14 NOTE — Progress Notes (Signed)
Established Patient Office Visit  Subjective:  Patient ID: [name], male    DOB: August 16, 1945  Age: 77 y.o. MRN: [id number]  CC:  Chief Complaint  Patient presents with   Diabetes    Follow up    Back Pain    Having severe back pain , was in ER last week with pain    Hypertension    Follow up     HPI [name] is a 77 y.o. male with past medical history of HTN, CAD, PAD, type II DM, GERD, HLD and ?Hypothyroidism who presents for f/u of his chronic medical conditions.  CAD, PAD and HTN: He has had cardiac stents and stent in LLE.  He follows up with Dr. Allyson Sabal for it.  He takes aspirin, Plavix and Praluent for it.  He takes lisinopril and metoprolol as well.  He denies any chest pain, dyspnea or palpitations currently.  He is on Imdur and Ranexa for angina.  Type II DM: He is on Metaglip 5-500 mg QD and Rybelsus 7 mg QD currently.  His last HbA1c was 5.7 in 06/24.  He used to take Metaglip BID in the past, which caused episodes of hypoglycemia.  His blood glucose ranges around 120 most of the time now.  He denies any polyuria or polydipsia currently. He has had cost concern with Rybelsus.  Acute low back pain: He reports acute right-sided low back pain since 04/27/23 and went to ER on 05/04/23.  He was given Norco as needed for severe pain and Robaxin.  He has noticed improvement in back pain, but still has right-sided low back discomfort.  Pain is constant, dull, nonradiating and worse with sideways movement.  He reports lifting heavy objects at home recently.  Denies any numbness or tingling of the LE.  Past Medical History:  Diagnosis Date   CHF (congestive heart failure) (HCC)    Coronary artery disease 08/28/2010   s/p multiple caths 2012, BMS PCI OM1 on August 28, 2010-during NSTEMI; January 2019 non-STEMI- occlusion of OM stent (very late stent thrombosis), initial wire crossed with PTCA, but unable to rewire, PCI aborted--> plan medical therapy, normal EF   Diabetes  mellitus    GERD (gastroesophageal reflux disease)    Hyperlipidemia    Hypertension    Non-STEMI (non-ST elevated myocardial infarction) [facility]) January 2012 and 2019   a) Jan 2012: 99% OM1 - BMS PCI; b) Jan 2019: Very late stent thrombosis/100% OM1 -after initially causing him for repeat PTCA restoring flow, unable to recross to place stent. - >  Medical therapy.   NSTEMI (non-ST elevated myocardial infarction) (HCC)    Admitted 08/29/17 with NSTEMI-Troponin peak 8.8, normal LVF   PVD (peripheral vascular disease) (HCC)    left SFA PTA & stenting in 02/2005 (Dr. Erlene Quan)   Vitamin D deficiency disease 05/04/2019    Past Surgical History:  Procedure Laterality Date   BIOPSY  08/02/2019   Procedure: BIOPSY;  Surgeon: Corbin Ade, MD;  [facility]: [facility];  Service: Endoscopy;;  gastric    CARDIAC CATHETERIZATION  12/23/2004   normal L main, normal LAD, normal L Cfx, RCA with 20% hypodense lesion in first end of vessel (Dr. Erlene Quan)   CARDIAC CATHETERIZATION  08/26/2007   no significant CAD by cath, EF 50% (Dr. Evlyn Courier)   COLONOSCOPY  12/2009   Dr. Louie Casa   COLONOSCOPY N/A 01/30/2017   pancolonic diverticulosis, non-bleeding internal hemorrhoids.   COLONOSCOPY WITH PROPOFOL N/A 09/08/2019  Procedure: COLONOSCOPY WITH PROPOFOL;  Surgeon: Corbin Ade, MD;  [facility];  Service: Endoscopy;  Laterality: N/A;  11:15am   CORONARY BALLOON ANGIOPLASTY N/A 08/30/2017   Procedure: CORONARY BALLOON ANGIOPLASTY;  Surgeon: Runell Gess, MD;  [facility];  Service: Cardiovascular;  100% CTO very late stent thrombosis OM1 -> initially crossed with PTCA, but then unable to recross after losing my positioning.  PTCA ABORTED.  UNSUCCESSFUL ATTEMPT   CORONARY BALLOON ANGIOPLASTY  02/10/2011   95% prox in-stent restenosis within OM stent - opened with cutting balloon (Dr. Bishop Limbo)   CORONARY STENT INTERVENTION  07/17/2011   in-stent restenosis -  re-stented with Promus 2.25x45mm DES (Dr. Ranae Palms)   CORONARY STENT INTERVENTION  08/28/2010   NSTEMI: OM1 99% BMS PCI 2.0x47mm MiniVision BMS (Dr. 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 Erlene Quan)   LEFT HEART CATH AND CORONARY ANGIOGRAPHY N/A 08/30/2017   Procedure: LEFT HEART CATH AND CORONARY ANGIOGRAPHY;  Surgeon: Runell Gess, MD;  [facility];  Service: Cardiovascular;  100% very late stent thrombosis of Overlapped BMS-DES OM1 -> attempted PTCA   LEFT HEART CATH AND CORONARY ANGIOGRAPHY  08/28/2010   NSTEMI: OM1 99% BMS PCI  (Dr. Erlene Quan)   LEFT HEART CATH AND CORONARY ANGIOGRAPHY  09/11/2010   patent stent (Dr. Ranae Palms)   LEFT HEART CATH AND CORONARY ANGIOGRAPHY  02/10/2011   95% prox in-stent restenosis within OM stent  (Dr. Bishop Limbo)   LEFT HEART CATHETERIZATION WITH CORONARY ANGIOGRAM N/A 07/17/2011   Procedure: LEFT HEART CATHETERIZATION WITH CORONARY ANGIOGRAM;  Surgeon: Marykay Lex, MD;  Location: [facility] CATH LAB;  Service: Cardiovascular;  Laterality: N/A;  Right radial approach;  90% ISR of BMS (5 months post PTCA for ISR) --> DES PCI   left knee arthroscopy  05/2016   NM MYOCAR PERF WALL MOTION  09/08/2013   abnormal lexiscan - low to intermediate risk;    POLYPECTOMY  09/08/2019   Procedure: POLYPECTOMY;  Surgeon: Corbin Ade, MD;  [facility];  Service: Endoscopy;;   TOOTH EXTRACTION Right 06/19/2020   TRANSTHORACIC ECHOCARDIOGRAM   09/01/2017   Normal LV size and function.  EF 66 5%.  Normal wall motion.  GR 1 DD.  Mild aortic sclerosis.  Aortic root mildly dilated at 40 mm.    Family History  Problem Relation Age of Onset   Arrhythmia Mother 60   Early death Son    Colon cancer Neg Hx    Liver disease Neg Hx    Inflammatory bowel disease Neg Hx    Colon polyps Neg Hx     Social History   Socioeconomic History   Marital status: Married    Spouse name: [name]   Number of children: 3   Years of education: 12   Highest education level: Some college, no degree  Occupational History   Occupation: Retired    Associate Professor: [employer]  Tobacco Use   Smoking status: Former    Current packs/day:  0.00    Average packs/day: 1 pack/day for 30.0 years (30.0 ttl pk-yrs)    Types: Cigarettes    Start date: 05/11/1969    Quit date: 05/12/1999    Years since quitting: 24.0    Passive exposure: Past   Smokeless tobacco: Never  Vaping Use   Vaping status: Never Used  Substance and Sexual Activity   Alcohol use: No   Drug use: No   Sexual activity: Yes  Other Topics Concern   Not on file  Social History Narrative   Married for 49 years.Lives with wife.Retired,ex-lab Pensions consultant.Ex-Marine,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   Social Determinants of Health   Financial Resource Strain: Low Risk  (09/15/2022)   Overall Financial Resource Strain (CARDIA)    Difficulty of Paying Living Expenses: Not hard at all  Food Insecurity: No Food Insecurity (09/15/2022)   Hunger Vital Sign    Worried About Running Out of Food in the Last Year: Never true    Ran Out of Food in the Last Year: Never true  Transportation Needs: No Transportation Needs (09/15/2022)   PRAPARE - Administrator, Civil Service (Medical): No    Lack of Transportation (Non-Medical): No  Physical Activity: Insufficiently Active (09/15/2022)   Exercise Vital Sign    Days of Exercise per Week: 3 days    Minutes of Exercise per Session: 30 min  Stress: No  Stress Concern Present (09/15/2022)   [employer] of Occupational Health - Occupational Stress Questionnaire    Feeling of Stress : Not at all  Social Connections: Moderately Integrated (09/15/2022)   Social Connection and Isolation Panel [NHANES]    Frequency of Communication with Friends and Family: More than three times a week    Frequency of Social Gatherings with Friends and Family: More than three times a week    Attends Religious Services: More than 4 times per year    Active Member of [employer] or Organizations: No    Attends Banker Meetings: Never    Marital Status: Married  Catering manager Violence: Not At Risk (09/15/2022)   Humiliation, Afraid, Rape, and Kick questionnaire    Fear of Current or Ex-Partner: No    Emotionally Abused: No    Physically Abused: No    Sexually Abused: No    Outpatient Medications Prior to Visit  Medication Sig Dispense Refill   Alirocumab (PRALUENT) 75 MG/ML SOAJ Inject 1 Dose into the skin every 14 (fourteen) days.     aspirin 81 MG chewable tablet Chew 1 tablet (81 mg total) by mouth daily.     Cholecalciferol (VITAMIN D3) 50 MCG (2000 UT) TABS Take 3 tablets by mouth daily.     clopidogrel (PLAVIX) 75 MG tablet TAKE ONE TABLET BY MOUTH ONCE DAILY. 30 tablet 9   dextromethorphan-guaiFENesin (MUCINEX DM) 30-600 MG 12hr tablet Take 1 tablet by mouth as needed.     diclofenac Sodium (VOLTAREN) 1 % GEL Apply topically as needed.     docusate sodium (COLACE) 100 MG capsule Take 100 mg by mouth every other day.     fexofenadine (ALLEGRA) 180 MG tablet Take 180 mg by mouth as needed for allergies or rhinitis. Rotates with Claritin.     glipiZIDE-metformin (METAGLIP) 5-500 MG tablet TAKE (1) TABLET BY MOUTH ONCE DAILY AT 12 NOON. 90 tablet 0   isosorbide mononitrate (IMDUR) 60 MG 24 hr tablet Take 1 tablet (60 mg total) by mouth daily. 90 tablet 3   lisinopril (PRINIVIL,ZESTRIL) 5 MG tablet Take  5 mg by mouth daily.     metoprolol succinate  (TOPROL-XL) 25 MG 24 hr tablet TAKE 2 TABLETS BY MOUTH ONCE DAILY. 180 tablet 0   nitroGLYCERIN (NITROSTAT) 0.4 MG SL tablet DISSOLVE 1 TABLET UNDER TONGUE EVERY 5 MINUTES UP TO 15 MIN FOR CHEST PAIN. IF NO RELIEF CALL 911. 25 tablet 4   omeprazole (PRILOSEC) 20 MG capsule Take 20 mg by mouth daily.     ONETOUCH ULTRA test strip USE AS DIRECTED UP TO 3 TIMES DAILY IF NEEDED. 100 strip 3   OVER THE COUNTER MEDICATION Stool softner one daily     ranolazine (RANEXA) 500 MG 12 hr tablet Take 500 mg by mouth 2 (two) times daily.     Semaglutide (RYBELSUS) 7 MG TABS Take 1 tablet (7 mg total) by mouth daily. 90 tablet 3   sucralfate (CARAFATE) 1 GM/10ML suspension Take 10 mLs (1 g total) by mouth 4 (four) times daily. 420 mL 1   HYDROcodone-acetaminophen (NORCO/VICODIN) 5-325 MG tablet Take one tab po q 4 hrs prn pain 10 tablet 0   methocarbamol (ROBAXIN) 500 MG tablet Take 1 tablet (500 mg total) by mouth every 8 (eight) hours as needed for muscle spasms. May cause drowsiness 12 tablet 0   No facility-administered medications prior to visit.    Allergies  Allergen Reactions   Crestor [Rosuvastatin]     myalgia   Jardiance [Empagliflozin]     Penile swelling and rash   Propoxyphene N-Acetaminophen Nausea Only   Statins Other (See Comments)    Severe muscle cramping/aching/pain/ elevated CK   Tape     Blisters   Zetia [Ezetimibe]     Muscle cramping   Zocor [Simvastatin]     myalgia   Flomax [Tamsulosin Hcl] Rash   Flomax [Tamsulosin] Rash   Levaquin [Levofloxacin Hemihydrate] Rash   Levofloxacin Rash   Penicillin G Diarrhea and Rash   Penicillins Rash    Broke out in rash 6 years ago, pt recently took penicillin (09/2016) and had no reaction Has patient had a PCN reaction causing immediate rash, facial/tongue/throat swelling, SOB or lightheadedness with hypotension: Yes Has patient had a PCN reaction causing severe rash involving mucus membranes or skin necrosis: Unknown Has patient  had a PCN reaction that required hospitalization: No Has patient had a PCN reaction occurring within the last 10 years: Yes If all of the above answers are "NO", then m    ROS Review of Systems  Constitutional:  Negative for chills and fever.  HENT:  Negative for congestion and sore throat.   Eyes:  Negative for pain and discharge.  Respiratory:  Negative for cough and shortness of breath.   Cardiovascular:  Negative for chest pain and palpitations.  Gastrointestinal:  Negative for diarrhea, nausea and vomiting.  Endocrine: Negative for polydipsia and polyuria.  Genitourinary:  Negative for dysuria and hematuria.       Nocturia  Musculoskeletal:  Positive for back pain. Negative for neck pain and neck stiffness.  Skin:  Negative for rash.  Neurological:  Negative for dizziness, weakness, numbness and headaches.  Psychiatric/Behavioral:  Negative for agitation and behavioral problems.       Objective:    Physical Exam Vitals reviewed.  Constitutional:      General: He is not in acute distress.    Appearance: He is not diaphoretic.  HENT:     Head: Normocephalic and atraumatic.     Nose: Nose normal.     Mouth/Throat:  Mouth: Mucous membranes are moist.  Eyes:     General: No scleral icterus.    Extraocular Movements: Extraocular movements intact.  Cardiovascular:     Rate and Rhythm: Normal rate and regular rhythm.     Pulses: Normal pulses.     Heart sounds: Normal heart sounds. No murmur heard. Pulmonary:     Breath sounds: Normal breath sounds. No wheezing or rales.  Abdominal:     Palpations: Abdomen is soft.     Tenderness: There is no abdominal tenderness.  Musculoskeletal:     Cervical back: Neck supple. No tenderness.     Lumbar back: Tenderness (Right paraspinal) present. Negative right straight leg raise test and negative left straight leg raise test.     Right lower leg: No edema.     Left lower leg: No edema.  Skin:    General: Skin is warm.      Findings: No rash.  Neurological:     General: No focal deficit present.     Mental Status: He is alert and oriented to person, place, and time.     Sensory: No sensory deficit.     Motor: No weakness.  Psychiatric:        Mood and Affect: Mood normal.        Behavior: Behavior normal.     BP 139/69 (BP Location: Left Arm, Patient Position: Sitting, Cuff Size: Normal)   Pulse 67   Ht 6' (1.829 m)   Wt 209 lb 12.8 oz (95.2 kg)   SpO2 94%   BMI 28.45 kg/m  Wt Readings from Last 3 Encounters:  05/14/23 209 lb 12.8 oz (95.2 kg)  01/14/23 213 lb (96.6 kg)  11/18/22 211 lb 9.6 oz (96 kg)    Lab Results  Component Value Date   TSH 0.670 01/14/2023   Lab Results  Component Value Date   WBC 6.6 05/04/2023   HGB 12.9 (L) 05/04/2023   HCT 38.6 (L) 05/04/2023   MCV 100.5 (H) 05/04/2023   PLT 169 05/04/2023   Lab Results  Component Value Date   NA 141 05/04/2023   K 3.8 05/04/2023   CO2 26 05/04/2023   GLUCOSE 92 05/04/2023   BUN 15 05/04/2023   CREATININE 1.22 05/04/2023   BILITOT 1.7 (H) 05/04/2023   ALKPHOS 32 (L) 05/04/2023   AST 18 05/04/2023   ALT 17 05/04/2023   PROT 6.9 05/04/2023   ALBUMIN 3.9 05/04/2023   CALCIUM 9.7 05/04/2023   ANIONGAP 7 05/04/2023   EGFR 49 (L) 01/14/2023   Lab Results  Component Value Date   CHOL 159 08/12/2022   Lab Results  Component Value Date   HDL 36 (L) 08/12/2022   Lab Results  Component Value Date   LDLCALC 76 08/12/2022   Lab Results  Component Value Date   TRIG 287 (H) 08/12/2022   Lab Results  Component Value Date   CHOLHDL 4.4 08/12/2022   Lab Results  Component Value Date   HGBA1C 5.5 05/14/2023      Assessment & Plan:   Problem List Items Addressed This Visit       Cardiovascular and Mediastinum   CAD S/P percutaneous coronary angioplasty (Chronic)    S/p stent placement Followed by Dr. Sharlett Iles On aspirin, Plavix and Praluent On beta-blocker, Imdur and Ranexa Denies any chest pain  currently      DM (diabetes mellitus), type 2 with peripheral vascular complications (HCC) - Primary (Chronic)    Lab Results  Component Value  Date   HGBA1C 5.7 (H) 01/14/2023   Well-controlled On glipizide-metformin 5-500 mg QD currently On Rybelsus 7 mg QD, but has cost concern - sample provided today, advised [facility] to Ozempic if it is on formulary as Rybelsus is not on [place] formulary Had given Jardiance for additional cardiac benefit, but had rash and penile swelling, DC Jardiance Advised to follow diabetic diet On statin and ACEi F/u BMP and HbA1C Diabetic eye exam: Advised to follow up with Ophthalmology for diabetic eye exam      Relevant Orders   Bayer DCA Hb A1c Waived   Essential hypertension (Chronic)    BP Readings from Last 1 Encounters:  05/14/23 139/69   Well-controlled with lisinopril and metoprolol Counseled for compliance with the medications Advised DASH diet and moderate exercise/walking      Atypical angina (HCC)    On Ranexa and Imdur - denies chest pain episodes now Has Nitroglycerin PRN Followed by Cardiology        Genitourinary   Stage 3a chronic kidney disease (HCC)    Last BMP showed GFR of >60 during ER visit, has been around 55 lately Maintain adequate hydration On ACEi Avoid nephrotoxic agents Checked BMP and urine microalbumin/creatinine ratio        Other   Hyperlipidemia with target LDL less than 70 (Chronic)    Lipid profile reviewed from [place] chart On Praluent as he did not tolerate statin in the past      Acute bilateral low back pain without sciatica    Had ER visit for acute low back pain His pain is likely due to paraspinal muscle strain from heavy lifting Avoid heavy lifting and frequent bending Prednisone 20 mg QD X 5 days, avoid oral NSAIDs due to his history of CAD and GERD Tylenol as needed for pain Flexeril as needed for neck muscle spasms Simple back exercises material  provided Checked x-ray lumbar spine - DDD of lumbar spine      Relevant Medications   predniSONE (DELTASONE) 20 MG tablet   cyclobenzaprine (FLEXERIL) 5 MG tablet   Other Visit Diagnoses     Encounter for immunization       Relevant Orders   Flu Vaccine Trivalent High Dose (Fluad) (Completed)       Meds ordered this encounter  Medications   predniSONE (DELTASONE) 20 MG tablet    Sig: Take 1 tablet (20 mg total) by mouth daily with breakfast.    Dispense:  5 tablet    Refill:  0   cyclobenzaprine (FLEXERIL) 5 MG tablet    Sig: Take 1 tablet (5 mg total) by mouth at bedtime as needed for muscle spasms.    Dispense:  30 tablet    Refill:  1     Follow-up: Return in about 4 months (around 09/14/2023) for Annual physical.    Anabel Halon, MD

## 2023-05-14 NOTE — Assessment & Plan Note (Signed)
BP Readings from Last 1 Encounters:  05/14/23 139/69   Well-controlled with lisinopril and metoprolol Counseled for compliance with the medications Advised DASH diet and moderate exercise/walking

## 2023-05-14 NOTE — Assessment & Plan Note (Signed)
Had ER visit for acute low back pain His pain is likely due to paraspinal muscle strain from heavy lifting Avoid heavy lifting and frequent bending Prednisone 20 mg QD X 5 days, avoid oral NSAIDs due to his history of CAD and GERD Tylenol as needed for pain Flexeril as needed for neck muscle spasms Simple back exercises material provided Checked x-ray lumbar spine - DDD of lumbar spine

## 2023-05-14 NOTE — Assessment & Plan Note (Addendum)
Lab Results  Component Value Date   HGBA1C 5.7 (H) 01/14/2023   Well-controlled On glipizide-metformin 5-500 mg QD currently On Rybelsus 7 mg QD, but has cost concern - sample provided today, advised to discuss with [facility] provider to switch to Ozempic if it is on formulary as Rybelsus is not on [place] formulary Had given Jardiance for additional cardiac benefit, but had rash and penile swelling, DC Jardiance Advised to follow diabetic diet On statin and ACEi F/u BMP and HbA1C Diabetic eye exam: Advised to follow up with Ophthalmology for diabetic eye exam

## 2023-05-14 NOTE — Patient Instructions (Signed)
Please take Prednisone as prescribed. Take Flexeril at bedtime for muscle spasms. Avoid heavy lifting and frequent bending.  Please ask [facility] provider if they can prescribe Rybelsus or Ozempic for your diabetes.  Please continue to take medications as prescribed.  Please continue to follow low carb diet and perform moderate exercise/walking at least 150 mins/week.

## 2023-05-14 NOTE — Assessment & Plan Note (Signed)
S/p stent placement °Followed by Dr. Berry-cardiology °On aspirin, Plavix and Praluent °On beta-blocker, Imdur and Ranexa °Denies any chest pain currently °

## 2023-05-14 NOTE — Assessment & Plan Note (Addendum)
Last BMP showed GFR of >60 during ER visit, has been around 55 lately Maintain adequate hydration On ACEi Avoid nephrotoxic agents Checked BMP and urine microalbumin/creatinine ratio

## 2023-05-18 LAB — BAYER DCA HB A1C WAIVED: HB A1C (BAYER DCA - WAIVED): 5.5 % (ref 4.8–5.6)

## 2023-05-26 ENCOUNTER — Other Ambulatory Visit: Payer: Self-pay | Admitting: Cardiovascular Disease

## 2023-05-26 ENCOUNTER — Emergency Department (HOSPITAL_COMMUNITY): Payer: PPO

## 2023-05-26 ENCOUNTER — Observation Stay (HOSPITAL_COMMUNITY)
Admission: EM | Admit: 2023-05-26 | Discharge: 2023-05-27 | Disposition: A | Discharge status: Home / Self Care | Payer: PPO | Attending: Internal Medicine | Admitting: Internal Medicine

## 2023-05-26 ENCOUNTER — Other Ambulatory Visit: Payer: Self-pay

## 2023-05-26 ENCOUNTER — Encounter (HOSPITAL_COMMUNITY): Payer: Self-pay | Admitting: *Deleted

## 2023-05-26 DIAGNOSIS — I739 Peripheral vascular disease, unspecified: Secondary | ICD-10-CM | POA: Diagnosis present

## 2023-05-26 DIAGNOSIS — Z7982 Long term (current) use of aspirin: Secondary | ICD-10-CM | POA: Insufficient documentation

## 2023-05-26 DIAGNOSIS — N1831 Chronic kidney disease, stage 3a: Secondary | ICD-10-CM | POA: Diagnosis not present

## 2023-05-26 DIAGNOSIS — I509 Heart failure, unspecified: Secondary | ICD-10-CM | POA: Insufficient documentation

## 2023-05-26 DIAGNOSIS — Z9861 Coronary angioplasty status: Secondary | ICD-10-CM | POA: Insufficient documentation

## 2023-05-26 DIAGNOSIS — I2510 Atherosclerotic heart disease of native coronary artery without angina pectoris: Secondary | ICD-10-CM | POA: Diagnosis not present

## 2023-05-26 DIAGNOSIS — E1151 Type 2 diabetes mellitus with diabetic peripheral angiopathy without gangrene: Secondary | ICD-10-CM | POA: Insufficient documentation

## 2023-05-26 DIAGNOSIS — Z87891 Personal history of nicotine dependence: Secondary | ICD-10-CM | POA: Diagnosis not present

## 2023-05-26 DIAGNOSIS — I2089 Other forms of angina pectoris: Principal | ICD-10-CM

## 2023-05-26 DIAGNOSIS — Z79899 Other long term (current) drug therapy: Secondary | ICD-10-CM | POA: Diagnosis not present

## 2023-05-26 DIAGNOSIS — R0789 Other chest pain: Secondary | ICD-10-CM | POA: Diagnosis present

## 2023-05-26 DIAGNOSIS — Z7985 Long-term (current) use of injectable non-insulin antidiabetic drugs: Secondary | ICD-10-CM | POA: Insufficient documentation

## 2023-05-26 DIAGNOSIS — E1122 Type 2 diabetes mellitus with diabetic chronic kidney disease: Secondary | ICD-10-CM | POA: Diagnosis not present

## 2023-05-26 DIAGNOSIS — I25118 Atherosclerotic heart disease of native coronary artery with other forms of angina pectoris: Secondary | ICD-10-CM | POA: Insufficient documentation

## 2023-05-26 DIAGNOSIS — Z7902 Long term (current) use of antithrombotics/antiplatelets: Secondary | ICD-10-CM | POA: Insufficient documentation

## 2023-05-26 DIAGNOSIS — I130 Hypertensive heart and chronic kidney disease with heart failure and stage 1 through stage 4 chronic kidney disease, or unspecified chronic kidney disease: Secondary | ICD-10-CM | POA: Insufficient documentation

## 2023-05-26 DIAGNOSIS — R079 Chest pain, unspecified: Secondary | ICD-10-CM | POA: Diagnosis not present

## 2023-05-26 DIAGNOSIS — E118 Type 2 diabetes mellitus with unspecified complications: Secondary | ICD-10-CM | POA: Diagnosis present

## 2023-05-26 DIAGNOSIS — I10 Essential (primary) hypertension: Secondary | ICD-10-CM | POA: Diagnosis present

## 2023-05-26 LAB — CBC
HCT: 39.8 % (ref 39.0–52.0)
Hemoglobin: 13.3 g/dL (ref 13.0–17.0)
MCH: 33.3 pg (ref 26.0–34.0)
MCHC: 33.4 g/dL (ref 30.0–36.0)
MCV: 99.7 fL (ref 80.0–100.0)
Platelets: 162 10*3/uL (ref 150–400)
RBC: 3.99 MIL/uL — ABNORMAL LOW (ref 4.22–5.81)
RDW: 12.3 % (ref 11.5–15.5)
WBC: 5.4 10*3/uL (ref 4.0–10.5)
nRBC: 0 % (ref 0.0–0.2)

## 2023-05-26 LAB — GLUCOSE, CAPILLARY: Glucose-Capillary: 136 mg/dL — ABNORMAL HIGH (ref 70–99)

## 2023-05-26 LAB — TROPONIN I (HIGH SENSITIVITY)
Troponin I (High Sensitivity): 8 ng/L (ref <18)
Troponin I (High Sensitivity): 7 ng/L (ref <18)

## 2023-05-26 LAB — BASIC METABOLIC PANEL
Anion gap: 7 (ref 5–15)
BUN: 15 mg/dL (ref 8–23)
CO2: 25 mmol/L (ref 22–32)
Calcium: 9.1 mg/dL (ref 8.9–10.3)
Chloride: 104 mmol/L (ref 98–111)
Creatinine, Ser: 1.35 mg/dL — ABNORMAL HIGH (ref 0.61–1.24)
GFR, Estimated: 55 mL/min — ABNORMAL LOW (ref >60)
Glucose, Bld: 147 mg/dL — ABNORMAL HIGH (ref 70–99)
Potassium: 3.7 mmol/L (ref 3.5–5.1)
Sodium: 136 mmol/L (ref 135–145)

## 2023-05-26 MED ORDER — ASPIRIN 81 MG PO CHEW
81.0000 mg | CHEWABLE_TABLET | Freq: Every day | ORAL | Status: DC
  Administered 2023-05-27: 81 mg via ORAL
  Filled 2023-05-26: qty 1

## 2023-05-26 MED ORDER — ONDANSETRON HCL 4 MG PO TABS: 4.0000 mg | ORAL_TABLET | Freq: Four times a day (QID) | ORAL | Status: DC | PRN

## 2023-05-26 MED ORDER — ASPIRIN 325 MG PO TABS
325.0000 mg | ORAL_TABLET | Freq: Once | ORAL | Status: AC
  Administered 2023-05-26: 325 mg via ORAL
  Filled 2023-05-26: qty 1

## 2023-05-26 MED ORDER — NITROGLYCERIN 0.4 MG SL SUBL
0.4000 mg | SUBLINGUAL_TABLET | SUBLINGUAL | Status: DC | PRN
  Administered 2023-05-26 (×2): 0.4 mg via SUBLINGUAL
  Filled 2023-05-26: qty 1

## 2023-05-26 MED ORDER — ONDANSETRON HCL 4 MG/2ML IJ SOLN: 4.0000 mg | Freq: Four times a day (QID) | INTRAMUSCULAR | Status: DC | PRN

## 2023-05-26 MED ORDER — ENOXAPARIN SODIUM 40 MG/0.4ML IJ SOSY
40.0000 mg | PREFILLED_SYRINGE | INTRAMUSCULAR | Status: DC
  Administered 2023-05-26: 40 mg via SUBCUTANEOUS
  Filled 2023-05-26: qty 0.4

## 2023-05-26 MED ORDER — POLYETHYLENE GLYCOL 3350 17 G PO PACK: 17.0000 g | PACK | Freq: Every day | ORAL | Status: DC | PRN

## 2023-05-26 MED ORDER — HYDRALAZINE HCL 20 MG/ML IJ SOLN: 10.0000 mg | INTRAMUSCULAR | Status: DC | PRN

## 2023-05-26 MED ORDER — CLOPIDOGREL BISULFATE 75 MG PO TABS
75.0000 mg | ORAL_TABLET | Freq: Every day | ORAL | Status: DC
  Administered 2023-05-26 – 2023-05-27 (×2): 75 mg via ORAL
  Filled 2023-05-26 (×2): qty 1

## 2023-05-26 NOTE — ED Triage Notes (Signed)
Pt c/o left side chest pain that is non-radiating; pt states he was standing up when the pain started; pt denies any other sx but states he took 2 nitroglycerin that did initially help with the pain but since has come back  Pt denies any pain at this time, but states the pain is intermittent

## 2023-05-26 NOTE — H&P (Addendum)
History and Physical    [name] WJX:[id number] DOB: 12-27-1945 DOA: 05/26/2023  PCP: Anabel Halon, MD   Patient coming from: Home  I have personally briefly reviewed patient's old medical records in [facility]  Chief Complaint: Chest PAin  HPI: [name] is a 77 y.o. male with medical history significant for CAD, PAD, DM< HTN.  Patient presented to the ED with complaints of 2 episodes of chest pain today.  He was standing in the kitchen washing dishes when he had the first episode, and resolved when he took nitro, he had a second episode also relieved with nitro. No associated difficulty breathing, no diaphoresis no nausea no vomiting.  Pain is mostly left-sided, described as sharp and without radiation.  He has had a heart attack in the past, but he reports this pain is different, at that time he was working on a treadmill when he started having chest pains.  Prior to today he has not had chest pains with activity.  He does not smoke cigarettes.  He is on Plavix and aspirin.  ED Course: Tmax 98.7.  Heart rate 50s.  Respiratory to 12-16.  Blood pressure systolic 140s to 213Y.  Sats greater than 95% on room air. Troponin 8> 7.  EKG unchanged from baseline.  EDP talked to cardiologist Dr. Wyline Mood, will see in consult in a.m. possible stress test tomorrow.  Review of Systems: As per HPI all other systems reviewed and negative.  Past Medical History:  Diagnosis Date   CHF (congestive heart failure) (HCC)    Coronary artery disease 08/28/2010   s/p multiple caths 2012, BMS PCI OM1 on August 28, 2010-during NSTEMI; January 2019 non-STEMI- occlusion of OM stent (very late stent thrombosis), initial wire crossed with PTCA, but unable to rewire, PCI aborted--> plan medical therapy, normal EF   Diabetes mellitus    GERD (gastroesophageal reflux disease)    Hyperlipidemia    Hypertension    Non-STEMI (non-ST elevated myocardial infarction) [facility]) January 2012 and 2019   a) Jan  2012: 99% OM1 - BMS PCI; b) Jan 2019: Very late stent thrombosis/100% OM1 -after initially causing him for repeat PTCA restoring flow, unable to recross to place stent. - >  Medical therapy.   NSTEMI (non-ST elevated myocardial infarction) (HCC)    Admitted 08/29/17 with NSTEMI-Troponin peak 8.8, normal LVF   PVD (peripheral vascular disease) (HCC)    left SFA PTA & stenting in 02/2005 (Dr. Erlene Quan)   Vitamin D deficiency disease 05/04/2019    Past Surgical History:  Procedure Laterality Date   BIOPSY  08/02/2019   Procedure: BIOPSY;  Surgeon: Corbin Ade, MD;  [facility];  Service: Endoscopy;;  gastric    CARDIAC CATHETERIZATION  12/23/2004   normal L main, normal LAD, normal L Cfx, RCA with 20% hypodense lesion in first end of vessel (Dr. Erlene Quan)   CARDIAC CATHETERIZATION  08/26/2007   no significant CAD by cath, EF 50% (Dr. Evlyn Courier)   COLONOSCOPY  12/2009   Dr. Louie Casa   COLONOSCOPY N/A 01/30/2017   pancolonic diverticulosis, non-bleeding internal hemorrhoids.   COLONOSCOPY WITH PROPOFOL N/A 09/08/2019   Procedure: COLONOSCOPY WITH PROPOFOL;  Surgeon: Corbin Ade, MD;  [facility]: [facility];  Service: Endoscopy;  Laterality: N/A;  11:15am   CORONARY BALLOON ANGIOPLASTY N/A 08/30/2017   Procedure: CORONARY BALLOON ANGIOPLASTY;  Surgeon: Runell Gess, MD;  [facility];  Service: Cardiovascular;  100% CTO very  late stent thrombosis OM1 -> initially crossed with PTCA, but then unable to recross after losing my positioning.  PTCA ABORTED.  UNSUCCESSFUL ATTEMPT   CORONARY BALLOON ANGIOPLASTY  02/10/2011   95% prox in-stent restenosis within OM stent - opened with cutting balloon (Dr. Bishop Limbo)   CORONARY STENT INTERVENTION  07/17/2011   in-stent restenosis - re-stented with Promus 2.25x67mm DES (Dr. Ranae Palms)   CORONARY STENT INTERVENTION  08/28/2010   NSTEMI: OM1 99% BMS PCI 2.0x78mm MiniVision BMS (Dr. 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 Erlene Quan)   LEFT HEART CATH AND CORONARY ANGIOGRAPHY N/A 08/30/2017   Procedure: LEFT HEART CATH AND CORONARY ANGIOGRAPHY;  Surgeon: Runell Gess, MD;  [facility]: [facility];  Service: Cardiovascular;  100% very late stent thrombosis of Overlapped BMS-DES OM1 -> attempted PTCA   LEFT HEART CATH AND CORONARY ANGIOGRAPHY  08/28/2010   NSTEMI: OM1 99% BMS PCI  (Dr. Erlene Quan)   LEFT HEART CATH AND CORONARY ANGIOGRAPHY  09/11/2010   patent stent (Dr. Ranae Palms)   LEFT HEART CATH AND CORONARY ANGIOGRAPHY  02/10/2011   95% prox in-stent restenosis within OM stent  (Dr. Bishop Limbo)   LEFT HEART CATHETERIZATION WITH CORONARY ANGIOGRAM N/A 07/17/2011   Procedure: LEFT HEART CATHETERIZATION WITH CORONARY ANGIOGRAM;  Surgeon: Marykay Lex, MD;  Location: [facility] CATH LAB;  Service: Cardiovascular;  Laterality: N/A;  Right radial approach;  90% ISR of BMS (5 months post PTCA for ISR) --> DES PCI   left knee arthroscopy  05/2016   NM MYOCAR PERF WALL MOTION  09/08/2013   abnormal lexiscan - low to intermediate risk;    POLYPECTOMY  09/08/2019   Procedure: POLYPECTOMY;  Surgeon: Corbin Ade, MD;  [facility];  Service: Endoscopy;;   TOOTH EXTRACTION Right 06/19/2020   TRANSTHORACIC ECHOCARDIOGRAM  09/01/2017   Normal LV size and function.  EF 66 5%.  Normal wall motion.  GR 1 DD.  Mild aortic sclerosis.  Aortic root mildly dilated at 40 mm.     reports that he  quit smoking about 24 years ago. His smoking use included cigarettes. He started smoking about 54 years ago. He has a 30 pack-year smoking history. He has been exposed to tobacco smoke. He has never used smokeless tobacco. He reports that he does not drink alcohol and does not use drugs.  Allergies  Allergen Reactions   Crestor [Rosuvastatin]     myalgia   Jardiance [Empagliflozin]     Penile swelling and rash   Propoxyphene N-Acetaminophen Nausea Only   Statins Other (See Comments)    Severe muscle cramping/aching/pain/ elevated CK   Tape     Blisters   Zetia [Ezetimibe]     Muscle cramping   Zocor [Simvastatin]     myalgia   Flomax [Tamsulosin Hcl] Rash   Flomax [Tamsulosin] Rash   Levaquin [Levofloxacin Hemihydrate] Rash   Levofloxacin Rash   Penicillin G Diarrhea and Rash   Penicillins Rash    Broke out in rash 6 years ago,  pt recently took penicillin (09/2016) and had no reaction immediate rash, facial/tongue/throat swelling, SOB or lightheadedness with hypotension    Family History  Problem Relation Age of Onset   Arrhythmia Mother 27   Early death Son    Colon cancer Neg Hx    Liver disease Neg Hx    Inflammatory bowel disease Neg Hx    Colon polyps Neg Hx     Prior to Admission medications   Medication Sig Start Date End Date Taking? Authorizing Provider  cyclobenzaprine (FLEXERIL) 10 MG tablet Take 1 tablet by mouth at bedtime. 10/31/22  Yes [provider]  Alirocumab (PRALUENT) 75 MG/ML SOAJ Inject 1 Dose into the skin every 14 (fourteen) days.    [provider]  aspirin 81 MG chewable tablet Chew 1 tablet (81 mg total) by mouth daily. 09/03/17   Arty Baumgartner, NP  Cholecalciferol (VITAMIN D3) 50 MCG (2000 UT) TABS Take 3 tablets by mouth daily.    [provider]  clopidogrel (PLAVIX) 75 MG tablet TAKE ONE TABLET BY MOUTH ONCE DAILY. 12/07/15   Runell Gess, MD  cyclobenzaprine (FLEXERIL) 5 MG tablet Take 1 tablet (5 mg total)  by mouth at bedtime as needed for muscle spasms. 05/14/23   Anabel Halon, MD  dextromethorphan-guaiFENesin [facility] DM) 30-600 MG 12hr tablet Take 1 tablet by mouth as needed.    [provider]  diclofenac Sodium (VOLTAREN) 1 % GEL Apply topically as needed. 09/07/20   [provider]  docusate sodium (COLACE) 100 MG capsule Take 100 mg by mouth every other day.    [provider]  fexofenadine (ALLEGRA) 180 MG tablet Take 180 mg by mouth as needed for allergies or rhinitis. Rotates with Claritin.    [provider]  glipiZIDE-metformin (METAGLIP) 5-500 MG tablet TAKE (1) TABLET BY MOUTH ONCE DAILY AT 12 NOON. 03/25/23   Anabel Halon, MD  isosorbide mononitrate (IMDUR) 60 MG 24 hr tablet Take 1 tablet (60 mg total) by mouth daily. 06/21/19   Runell Gess, MD  lisinopril (PRINIVIL,ZESTRIL) 5 MG tablet Take 5 mg by mouth daily.    [provider]  metoprolol succinate (TOPROL-XL) 25 MG 24 hr tablet TAKE 2 TABLETS BY MOUTH ONCE DAILY. 02/23/23   Runell Gess, MD  nitroGLYCERIN (NITROSTAT) 0.4 MG SL tablet DISSOLVE 1 TABLET UNDER TONGUE EVERY 5 MINUTES UP TO 15 MIN FOR CHEST PAIN. IF NO RELIEF CALL 911. 10/27/22   Runell Gess, MD  omeprazole (PRILOSEC) 20 MG capsule Take 20 mg by mouth daily.    [provider]  ONETOUCH ULTRA test strip USE AS DIRECTED UP TO 3 TIMES DAILY IF NEEDED. 03/28/20   Wilson Singer, MD  OVER THE COUNTER MEDICATION Stool softner one daily    [provider]  predniSONE (DELTASONE) 20 MG tablet Take 1 tablet (20 mg total) by mouth daily with breakfast. 05/14/23   Anabel Halon, MD  ranolazine (RANEXA) 500 MG 12 hr tablet Take 500 mg by mouth 2 (two) times daily.    [provider]  Semaglutide (RYBELSUS) 7 MG TABS Take 1 tablet (7 mg total) by mouth daily. 09/10/22   Anabel Halon, MD  sucralfate (CARAFATE) 1 GM/10ML suspension Take 10 mLs (1 g total) by mouth 4 (four) times daily.  07/02/22   Gelene Mink, NP    Physical Exam: Vitals:   05/26/23 1430 05/26/23 1445 05/26/23 1500 05/26/23 1515  BP: (!) 166/90 (!) 167/82 Marland Kitchen)  170/79 (!) 173/84  Pulse: (!) 56 (!) 52 (!) 55 (!) 54  Resp: 12 15 13 16   Temp:      TempSrc:      SpO2: 99% 99% 98% 98%  Weight:      Height:        Constitutional: NAD, calm, comfortable Vitals:   05/26/23 1430 05/26/23 1445 05/26/23 1500 05/26/23 1515  BP: (!) 166/90 (!) 167/82 (!) 170/79 (!) 173/84  Pulse: (!) 56 (!) 52 (!) 55 (!) 54  Resp: 12 15 13 16   Temp:      TempSrc:      SpO2: 99% 99% 98% 98%  Weight:      Height:       Eyes: PERRL, lids and conjunctivae normal ENMT: Mucous membranes are moist. Neck: normal, supple, no masses, no thyromegaly Respiratory: clear to auscultation bilaterally, no wheezing, no crackles. Normal respiratory effort. No accessory muscle use.  Cardiovascular: Regular rate and rhythm, no murmurs / rubs / gallops. No extremity edema.  Extremities warm. Abdomen: no tenderness, no masses palpated. No hepatosplenomegaly. Bowel sounds positive.  Musculoskeletal: no clubbing / cyanosis. No joint deformity upper and lower extremities.  Skin: no rashes, lesions, ulcers. No induration Neurologic: No facial asymmetry, speech fluent, moving extremities spontaneously Psychiatric: Normal judgment and insight. Alert and oriented x 3. Normal mood.   Labs on Admission: I have personally reviewed following labs and imaging studies  CBC: Recent Labs  Lab 05/26/23 1018  WBC 5.4  HGB 13.3  HCT 39.8  MCV 99.7  PLT 162   Basic Metabolic Panel: Recent Labs  Lab 05/26/23 1018  NA 136  K 3.7  CL 104  CO2 25  GLUCOSE 147*  BUN 15  CREATININE 1.35*  CALCIUM 9.1   Urine analysis:    Component Value Date/Time   COLORURINE YELLOW 05/04/2023 1200   APPEARANCEUR CLEAR 05/04/2023 1200   APPEARANCEUR Clear 08/19/2022 1040   LABSPEC 1.019 05/04/2023 1200   PHURINE 6.0 05/04/2023 1200   GLUCOSEU NEGATIVE  05/04/2023 1200   HGBUR NEGATIVE 05/04/2023 1200   BILIRUBINUR NEGATIVE 05/04/2023 1200   BILIRUBINUR negative 11/15/2022 0922   BILIRUBINUR Negative 08/19/2022 1040   KETONESUR NEGATIVE 05/04/2023 1200   PROTEINUR NEGATIVE 05/04/2023 1200   UROBILINOGEN 1.0 11/15/2022 0922   UROBILINOGEN 0.2 09/10/2010 1753   NITRITE NEGATIVE 05/04/2023 1200   LEUKOCYTESUR NEGATIVE 05/04/2023 1200    Radiological Exams on Admission: DG Chest Port 1 View  Result Date: 05/26/2023 CLINICAL DATA:  Chest pain that began 30 minutes ago EXAM: PORTABLE CHEST 1 VIEW COMPARISON:  X-ray 12/03/2022 FINDINGS: No consolidation, pneumothorax or effusion. No edema. Normal cardiopericardial silhouette. Calcified aorta. Degenerative changes of the spine. IMPRESSION: No acute cardiopulmonary disease. Electronically Signed   By: Karen Kays M.D.   On: 05/26/2023 12:54    EKG: Independently reviewed.  Sinus rhythm, rate 52, QTc 398.  No significant change from prior.  Assessment/Plan Principal Problem:   Chest pain Active Problems:   Essential hypertension   Peripheral vascular disease (HCC)   CAD S/P percutaneous coronary angioplasty   DM (diabetes mellitus), type 2 with peripheral vascular complications (HCC)   Stage 3a chronic kidney disease (HCC)  Assessment and Plan: * Chest pain Typical and Atypical chest pain features.  Improved with nitro.  With history of coronary artery disease status post PCI 2012.  Troponins, EKG unremarkable. Follows with Dr. Allyson Sabal. -  EDP talked to Dr. Wyline Mood, will see in consult in a.m. - N.p.o. midnight -  ECHO - Aspirin 325 x 1, continue homea;81 mg aspirin and Plavix -Resume Imdur -Nitro as needed  Essential hypertension Stable. -Resume Imdur, metoprolol, lisinopril  Stage 3a chronic kidney disease (HCC) Creatinine stable at 1.35.  DM (diabetes mellitus), type 2 with peripheral vascular complications (HCC) Controlled with A1c 5.5. ?? Tight control. - SSI- S -Hold  metformin, glipizide  Peripheral vascular disease (HCC) Stable. Hx of  Lt SFA PTA 2006.  -Resume aspirin, Plavix   DVT prophylaxis: Lovenox Code Status: FULL code Family Communication: None at bedside Disposition Plan:  1-2 days Consults called: None Admission status: Obs Tele I certify that at the point of admission it is my clinical judgment that the patient will require inpatient hospital care spanning beyond 2 midnights from the point of admission due to high intensity of service, high risk for further deterioration and high frequency of surveillance required.   Author: Onnie Boer, MD 05/26/2023 4:57 PM  For on call review [email or web address].

## 2023-05-26 NOTE — Assessment & Plan Note (Signed)
Creatinine stable at 1.35.

## 2023-05-26 NOTE — Assessment & Plan Note (Signed)
Stable. -Resume Imdur, metoprolol, lisinopril

## 2023-05-26 NOTE — ED Provider Notes (Signed)
[place] EMERGENCY DEPARTMENT AT [facility] Provider Note   CSN: [id number] Arrival date & time: 05/26/23  4098     History {Add pertinent medical, surgical, social history, OB history to HPI:1} Chief Complaint  Patient presents with   Chest Pain    [name] is a 77 y.o. male.  Patient has history of coronary artery disease.  He has 1 stent that has been blocked.  He had 2 episodes of chest pain which he took nitro for help.  Had another episode in the emergency department that relieved itself   Chest Pain      Home Medications Prior to Admission medications   Medication Sig Start Date End Date Taking? Authorizing Provider  cyclobenzaprine (FLEXERIL) 10 MG tablet Take 1 tablet by mouth at bedtime. 10/31/22  Yes [provider]  Alirocumab (PRALUENT) 75 MG/ML SOAJ Inject 1 Dose into the skin every 14 (fourteen) days.    [provider]  aspirin 81 MG chewable tablet Chew 1 tablet (81 mg total) by mouth daily. 09/03/17   Arty Baumgartner, NP  Cholecalciferol (VITAMIN D3) 50 MCG (2000 UT) TABS Take 3 tablets by mouth daily.    [provider]  clopidogrel (PLAVIX) 75 MG tablet TAKE ONE TABLET BY MOUTH ONCE DAILY. 12/07/15   Runell Gess, MD  cyclobenzaprine (FLEXERIL) 5 MG tablet Take 1 tablet (5 mg total) by mouth at bedtime as needed for muscle spasms. 05/14/23   Anabel Halon, MD  dextromethorphan-guaiFENesin [facility] DM) 30-600 MG 12hr tablet Take 1 tablet by mouth as needed.    [provider]  diclofenac Sodium (VOLTAREN) 1 % GEL Apply topically as needed. 09/07/20   [provider]  docusate sodium (COLACE) 100 MG capsule Take 100 mg by mouth every other day.    [provider]  fexofenadine (ALLEGRA) 180 MG tablet Take 180 mg by mouth as needed for allergies or rhinitis. Rotates with Claritin.    [provider]  glipiZIDE-metformin (METAGLIP) 5-500 MG tablet TAKE (1) TABLET BY MOUTH ONCE  DAILY AT 12 NOON. 03/25/23   Anabel Halon, MD  isosorbide mononitrate (IMDUR) 60 MG 24 hr tablet Take 1 tablet (60 mg total) by mouth daily. 06/21/19   Runell Gess, MD  lisinopril (PRINIVIL,ZESTRIL) 5 MG tablet Take 5 mg by mouth daily.    [provider]  metoprolol succinate (TOPROL-XL) 25 MG 24 hr tablet TAKE 2 TABLETS BY MOUTH ONCE DAILY. 02/23/23   Runell Gess, MD  nitroGLYCERIN (NITROSTAT) 0.4 MG SL tablet DISSOLVE 1 TABLET UNDER TONGUE EVERY 5 MINUTES UP TO 15 MIN FOR CHEST PAIN. IF NO RELIEF CALL 911. 10/27/22   Runell Gess, MD  omeprazole (PRILOSEC) 20 MG capsule Take 20 mg by mouth daily.    [provider]  ONETOUCH ULTRA test strip USE AS DIRECTED UP TO 3 TIMES DAILY IF NEEDED. 03/28/20   Wilson Singer, MD  OVER THE COUNTER MEDICATION Stool softner one daily    [provider]  predniSONE (DELTASONE) 20 MG tablet Take 1 tablet (20 mg total) by mouth daily with breakfast. 05/14/23   Anabel Halon, MD  ranolazine (RANEXA) 500 MG 12 hr tablet Take 500 mg by mouth 2 (two) times daily.    [provider]  Semaglutide (RYBELSUS) 7 MG TABS Take 1 tablet (7 mg total) by mouth daily. 09/10/22   Anabel Halon, MD  sucralfate (CARAFATE) 1 GM/10ML suspension Take 10 mLs (1  g total) by mouth 4 (four) times daily. 07/02/22   Gelene Mink, NP      Allergies    Crestor [rosuvastatin], Jardiance [empagliflozin], Propoxyphene n-acetaminophen, Statins, Tape, Zetia [ezetimibe], Zocor [simvastatin], Flomax [tamsulosin hcl], Flomax [tamsulosin], Levaquin [levofloxacin hemihydrate], Levofloxacin, Penicillin g, and Penicillins    Review of Systems   Review of Systems  Cardiovascular:  Positive for chest pain.    Physical Exam Updated Vital Signs BP (!) 173/84   Pulse (!) 54   Temp 98.7 F (37.1 C) (Oral)   Resp 16   Ht 6' (1.829 m)   Wt 95.1 kg   SpO2 98%   BMI 28.43 kg/m  Physical Exam  ED Results / Procedures / Treatments    Labs (all labs ordered are listed, but only abnormal results are displayed) Labs Reviewed  BASIC METABOLIC PANEL - Abnormal; Notable for the following components:      Result Value   Glucose, Bld 147 (*)    Creatinine, Ser 1.35 (*)    GFR, Estimated 55 (*)    All other components within normal limits  CBC - Abnormal; Notable for the following components:   RBC 3.99 (*)    All other components within normal limits  TROPONIN I (HIGH SENSITIVITY)  TROPONIN I (HIGH SENSITIVITY)    EKG None  Radiology DG Chest Port 1 View  Result Date: 05/26/2023 CLINICAL DATA:  Chest pain that began 30 minutes ago EXAM: PORTABLE CHEST 1 VIEW COMPARISON:  X-ray 12/03/2022 FINDINGS: No consolidation, pneumothorax or effusion. No edema. Normal cardiopericardial silhouette. Calcified aorta. Degenerative changes of the spine. IMPRESSION: No acute cardiopulmonary disease. Electronically Signed   By: Karen Kays M.D.   On: 05/26/2023 12:54    Procedures Procedures  {Document cardiac monitor, telemetry assessment procedure when appropriate:1}  Medications Ordered in ED Medications - No data to display  ED Course/ Medical Decision Making/ A&P  I spoke with Dr. Wyline Mood and he would like the patient admitted to medicine and cardiology will consider doing a stress test tomorrow {   Click here for ABCD2, HEART and other calculatorsREFRESH Note before signing :1}                              Medical Decision Making Amount and/or Complexity of Data Reviewed Labs: ordered. Radiology: ordered.  Risk Decision regarding hospitalization.   Chest pain possible coronary artery disease.  Patient will be admitted to medicine and cardiology consult  {Document critical care time when appropriate:1} {Document review of labs and clinical decision tools ie heart score, Chads2Vasc2 etc:1}  {Document your independent review of radiology images, and any outside records:1} {Document your discussion with family  members, caretakers, and with consultants:1} {Document social determinants of health affecting pt's care:1} {Document your decision making why or why not admission, treatments were needed:1} Final Clinical Impression(s) / ED Diagnoses Final diagnoses:  Other forms of angina pectoris (HCC)    Rx / DC Orders ED Discharge Orders     None

## 2023-05-26 NOTE — Assessment & Plan Note (Addendum)
Stable. Hx of  Lt SFA PTA 2006.  -Resume aspirin, Plavix

## 2023-05-26 NOTE — Progress Notes (Signed)
Patient c/o angina left side of chest., denies radiating pain , EKG performed , given 1 time dose of nitroglycerin , notified Dr. Mariea Clonts .    05/26/23 1700  Vitals  Temp 97.6 F (36.4 C)  Temp Source Oral  BP (!) 179/91  MAP (mmHg) 114  Pulse Rate (!) 55  Resp 20  Level of Consciousness  Level of Consciousness Alert  MEWS COLOR  MEWS Score Color Green  Oxygen Therapy  SpO2 99 %  O2 Device Room Air  MEWS Score  MEWS Temp 0  MEWS Systolic 0  MEWS Pulse 0  MEWS RR 0  MEWS LOC 0  MEWS Score 0

## 2023-05-26 NOTE — Assessment & Plan Note (Addendum)
Controlled with A1c 5.5. ?? Tight control. - SSI- S -Hold metformin, glipizide

## 2023-05-26 NOTE — Assessment & Plan Note (Addendum)
Typical and Atypical chest pain features.  Improved with nitro.  With history of coronary artery disease status post PCI 2012.  Troponins, EKG unremarkable. Follows with Dr. Allyson Sabal. -  EDP talked to Dr. Wyline Mood, will see in consult in a.m. - N.p.o. midnight - ECHO - Aspirin 325 x 1, continue homea;81 mg aspirin and Plavix -Resume Imdur -Nitro as needed

## 2023-05-27 ENCOUNTER — Other Ambulatory Visit (HOSPITAL_COMMUNITY): Payer: Self-pay | Admitting: *Deleted

## 2023-05-27 ENCOUNTER — Other Ambulatory Visit (HOSPITAL_COMMUNITY): Payer: PPO

## 2023-05-27 ENCOUNTER — Encounter (HOSPITAL_COMMUNITY): Payer: Self-pay | Admitting: Internal Medicine

## 2023-05-27 ENCOUNTER — Observation Stay (HOSPITAL_COMMUNITY): Payer: PPO

## 2023-05-27 DIAGNOSIS — R0789 Other chest pain: Secondary | ICD-10-CM

## 2023-05-27 DIAGNOSIS — N1831 Chronic kidney disease, stage 3a: Secondary | ICD-10-CM | POA: Diagnosis not present

## 2023-05-27 DIAGNOSIS — I10 Essential (primary) hypertension: Secondary | ICD-10-CM | POA: Diagnosis not present

## 2023-05-27 DIAGNOSIS — I739 Peripheral vascular disease, unspecified: Secondary | ICD-10-CM | POA: Diagnosis not present

## 2023-05-27 LAB — NM MYOCAR MULTI W/SPECT W/WALL MOTION / EF
LV dias vol: 114 mL (ref 62–150)
LV sys vol: 43 mL
Nuc Stress EF: 62 %
Peak HR: 97 {beats}/min
RATE: 0.4
Rest HR: 64 {beats}/min
Rest Nuclear Isotope Dose: 11 mCi
SDS: 2
SRS: 1
SSS: 3
ST Depression (mm): 0 mm
Stress Nuclear Isotope Dose: 31 mCi
TID: 0.99

## 2023-05-27 MED ORDER — TECHNETIUM TC 99M TETROFOSMIN IV KIT
30.0000 | PACK | Freq: Once | INTRAVENOUS | Status: AC | PRN
  Administered 2023-05-27: 30 via INTRAVENOUS

## 2023-05-27 MED ORDER — RANOLAZINE ER 500 MG PO TB12
500.0000 mg | ORAL_TABLET | Freq: Two times a day (BID) | ORAL | Status: DC
  Administered 2023-05-27: 500 mg via ORAL
  Filled 2023-05-27: qty 1

## 2023-05-27 MED ORDER — METOPROLOL SUCCINATE ER 25 MG PO TB24: 25.0000 mg | ORAL_TABLET | Freq: Every day | ORAL | Status: DC

## 2023-05-27 MED ORDER — TECHNETIUM TC 99M TETROFOSMIN IV KIT
10.0000 | PACK | Freq: Once | INTRAVENOUS | Status: AC | PRN
  Administered 2023-05-27: 11 via INTRAVENOUS

## 2023-05-27 MED ORDER — LISINOPRIL 5 MG PO TABS
5.0000 mg | ORAL_TABLET | Freq: Every day | ORAL | Status: DC
  Administered 2023-05-27: 5 mg via ORAL
  Filled 2023-05-27: qty 1

## 2023-05-27 MED ORDER — ISOSORBIDE MONONITRATE ER 60 MG PO TB24
60.0000 mg | ORAL_TABLET | Freq: Every day | ORAL | Status: DC
  Administered 2023-05-27: 60 mg via ORAL
  Filled 2023-05-27: qty 1

## 2023-05-27 MED ORDER — SODIUM CHLORIDE FLUSH 0.9 % IV SOLN
INTRAVENOUS | Status: AC
  Administered 2023-05-27: 10 mL via INTRAVENOUS
  Filled 2023-05-27: qty 10

## 2023-05-27 MED ORDER — REGADENOSON 0.4 MG/5ML IV SOLN
INTRAVENOUS | Status: AC
  Administered 2023-05-27: 0.4 mg via INTRAVENOUS
  Filled 2023-05-27: qty 5

## 2023-05-27 NOTE — Discharge Summary (Signed)
Physician Discharge Summary   Patient: [name] MRN: [id number] DOB: 08-21-1945  Admit date:     05/26/2023  Discharge date: 05/27/23  Discharge Physician: Onalee Hua Darcell Sabino   PCP: Anabel Halon, MD   Recommendations at discharge:   Please follow up with primary care provider within 1-2 weeks  Please repeat BMP and CBC in one week     Hospital Course: 77 year old male with a history of coronary disease, diabetes mellitus type 2, hypertension, and peripheral arterial disease presenting with chest pain on 05/26/2023.  The patient states that he was washing the dishes when he had some left-sided chest pain that felt like a sharp pinch.  He stated it lasted about 5 seconds and went away.  He decided to take 1 sublingual nitroglycerin anyway.  About 5 to 10 minutes later, he had another episode of left-sided chest pain lasting about 5 seconds.  It had completely resolved.  He decided to take nitroglycerin again after had resolved.  He denied any fevers, chills, shortness breath, nausea, vomiting or diarrhea, diaphoresis.  He denies any recent anginal symptoms.  He denies any worsening dyspnea on exertion.  He is compliant with his aspirin and Plavix. In the ED, the patient was afebrile and hemodynamically stable with oxygen saturation 90% room air.  WBC 5.4, hemoglobin 13.3, platelets 162.  Sodium 136, potassium 3.7, bicarbonate 25, serum creatinine 1.35.  Troponin 8>> 7.  Chest x-ray was negative for any acute findings.  Cardiology was consulted to assist with management.  Assessment and Plan: Chest pain -Appears mostly atypical by history -Troponin 8>> 7 -Cardiology consult appreciated -05/27/23 myoview-normal, low risk>>cleared by cardiology for dc -Continue Imdur -Continue aspirin and Plavix   Coronary artery disease -status post stenting of his first OM branch with a bare-metal stent Dr. Allyson Sabal 08/28/2010  -Myoview stress test performed 06/28/2020 that showed no ischemia  -Continue  aspirin and Plavix    Essential hypertension -Continue Imdur, metoprolol, lisinopril   CKD stage IIIa -Baseline creatinine 1.3-1.4   Peripheral arterial disease Stable. Hx of  Lt SFA PTA 2006.  -Resume aspirin, Plavix, praluent       Consultants: cardiology Procedures performed: none  Disposition: Home Diet recommendation:  Cardiac diet DISCHARGE MEDICATION: Allergies as of 05/27/2023       Reactions   Crestor [rosuvastatin]    myalgia   Jardiance [empagliflozin]    Penile swelling and rash   Propoxyphene N-acetaminophen Nausea Only   Statins Other (See Comments)   Severe muscle cramping/aching/pain/ elevated CK   Tape    Blisters   Zetia [ezetimibe]    Muscle cramping   Zocor [simvastatin]    myalgia   Flomax [tamsulosin Hcl] Rash   Flomax [tamsulosin] Rash   Levaquin [levofloxacin Hemihydrate] Rash   Levofloxacin Rash   Penicillin G Diarrhea, Rash   Penicillins Rash   Broke out in rash 6 years ago, pt recently took penicillin (09/2016) and had no reaction immediate rash, facial/tongue/throat swelling, SOB or lightheadedness with hypotension        Medication List     STOP taking these medications    predniSONE 20 MG tablet Commonly known as: DELTASONE       TAKE these medications    acetaminophen 500 MG tablet Commonly known as: TYLENOL Take 500 mg by mouth every 6 (six) hours as needed for moderate pain (pain score 4-6).   aspirin 81 MG chewable tablet Chew 1 tablet (81 mg total) by mouth daily.   clopidogrel 75 MG  tablet Commonly known as: PLAVIX TAKE ONE TABLET BY MOUTH ONCE DAILY.   cyclobenzaprine 10 MG tablet Commonly known as: FLEXERIL Take 1 tablet by mouth at bedtime.   diclofenac Sodium 1 % Gel Commonly known as: VOLTAREN Apply topically as needed.   docusate sodium 100 MG capsule Commonly known as: COLACE Take 100 mg by mouth every other day.   glipiZIDE-metformin 5-500 MG tablet Commonly known as: METAGLIP TAKE (1)  TABLET BY MOUTH ONCE DAILY AT 12 NOON.   ibuprofen 200 MG tablet Commonly known as: ADVIL Take 200 mg by mouth every 6 (six) hours as needed for mild pain (pain score 1-3).   isosorbide mononitrate 60 MG 24 hr tablet Commonly known as: IMDUR Take 1 tablet (60 mg total) by mouth daily.   lisinopril 5 MG tablet Commonly known as: ZESTRIL Take 5 mg by mouth daily.   metoprolol succinate 25 MG 24 hr tablet Commonly known as: TOPROL-XL TAKE 2 TABLETS BY MOUTH ONCE DAILY.   nitroGLYCERIN 0.4 MG SL tablet Commonly known as: NITROSTAT DISSOLVE 1 TABLET UNDER TONGUE EVERY 5 MINUTES UP TO 15 MIN FOR CHEST PAIN. IF NO RELIEF CALL 911.   omeprazole 20 MG capsule Commonly known as: PRILOSEC Take 20 mg by mouth as needed (acid reflux).   OneTouch Ultra test strip Generic drug: glucose blood USE AS DIRECTED UP TO 3 TIMES DAILY IF NEEDED.   pantoprazole 40 MG tablet Commonly known as: PROTONIX Take 40 mg by mouth daily.   Praluent 75 MG/ML Soaj Generic drug: Alirocumab Inject 1 Dose into the skin every 14 (fourteen) days.   ranolazine 500 MG 12 hr tablet Commonly known as: RANEXA Take 500 mg by mouth 2 (two) times daily.   Rybelsus 7 MG Tabs Generic drug: Semaglutide Take 1 tablet (7 mg total) by mouth daily.   sucralfate 1 GM/10ML suspension Commonly known as: CARAFATE Take 10 mLs (1 g total) by mouth 4 (four) times daily. What changed:  when to take this reasons to take this   Vitamin D3 50 MCG (2000 UT) Tabs Take 3 tablets by mouth daily.        Follow-up Information     Joylene Grapes, NP Follow up on 07/03/2023.   Specialties: Cardiology, Family Medicine Why: Cardiology Follow-up on 07/03/2023 at 1:30 PM with Bernadene Person, NP (works with Dr. Allyson Sabal) Contact information: [address] [place] [address] [phone]                Discharge Exam: Ceasar Mons Weights   05/26/23 1005  Weight: 95.1 kg   HEENT:  [place]/AT, No thrush, no  icterus CV:  RRR, no rub, no S3, no S4 Lung:  CTA, no wheeze, no rhonchi Abd:  soft/+BS, NT Ext:  No edema, no lymphangitis, no synovitis, no rash   Condition at discharge: stable  The results of significant diagnostics from this hospitalization (including imaging, microbiology, ancillary and laboratory) are listed below for reference.   Imaging Studies: NM Myocar Multi W/Spect W/Wall Motion / EF  Result Date: 05/27/2023   The study is normal. There are no perfusion defects consistent with prior infact or current ischemia.  The study is low risk.   No ST deviation was noted.   There is a large moderate intensity inferior wall defect that is most intense in the resting images with normal wall motion. There is adjacent gut radiotracer uptake. Findings consistent with diaphragmatic attenuation and gut artifact.   Left ventricular function is normal. Nuclear  stress EF: 62%. The left ventricular ejection fraction is normal (55-65%). End diastolic cavity size is normal.   DG Chest Port 1 View  Result Date: 05/26/2023 CLINICAL DATA:  Chest pain that began 30 minutes ago EXAM: PORTABLE CHEST 1 VIEW COMPARISON:  X-ray 12/03/2022 FINDINGS: No consolidation, pneumothorax or effusion. No edema. Normal cardiopericardial silhouette. Calcified aorta. Degenerative changes of the spine. IMPRESSION: No acute cardiopulmonary disease. Electronically Signed   By: Karen Kays M.D.   On: 05/26/2023 12:54   CT Renal Stone Study  Result Date: 05/04/2023 CLINICAL DATA:  Acute right flank pain for 1 week. EXAM: CT ABDOMEN AND PELVIS WITHOUT CONTRAST TECHNIQUE: Multidetector CT imaging of the abdomen and pelvis was performed following the standard protocol without IV contrast. RADIATION DOSE REDUCTION: This exam was performed according to the departmental dose-optimization program which includes automated exposure control, adjustment of the mA and/or kV according to patient size and/or use of iterative reconstruction  technique. COMPARISON:  August 09, 2008. FINDINGS: Lower chest: No acute abnormality. Hepatobiliary: No focal liver abnormality is seen. No gallstones, gallbladder wall thickening, or biliary dilatation. Pancreas: Unremarkable. No pancreatic ductal dilatation or surrounding inflammatory changes. Spleen: Normal in size without focal abnormality. Adrenals/Urinary Tract: Adrenal glands are unremarkable. Kidneys are normal, without renal calculi, focal lesion, or hydronephrosis. Bladder is unremarkable. Stomach/Bowel: The stomach is unremarkable. There is no evidence of bowel obstruction or inflammation. The appendix is unremarkable. Vascular/Lymphatic: Aortic atherosclerosis. No enlarged abdominal or pelvic lymph nodes. Reproductive: Mild prostatic enlargement. Other: No abdominal wall hernia or abnormality. No abdominopelvic ascites. Musculoskeletal: No acute or significant osseous findings. IMPRESSION: No acute abnormality seen in the abdomen or pelvis. Aortic Atherosclerosis (ICD10-I70.0). Electronically Signed   By: Lupita Raider M.D.   On: 05/04/2023 16:47   CT L-SPINE NO CHARGE  Result Date: 05/04/2023 CLINICAL DATA:  Right flank pain and right lower back pain. EXAM: CT LUMBAR SPINE WITHOUT CONTRAST TECHNIQUE: Multidetector CT imaging of the lumbar spine was performed without intravenous contrast administration. Multiplanar CT image reconstructions were also generated. RADIATION DOSE REDUCTION: This exam was performed according to the departmental dose-optimization program which includes automated exposure control, adjustment of the mA and/or kV according to patient size and/or use of iterative reconstruction technique. COMPARISON:  None Available. FINDINGS: Segmentation: 5 lumbar type vertebrae. Alignment: Normal. Vertebrae: The bones are mildly osteopenic. There is no acute fracture or dislocation. There is mild disc space narrowing at L3-L4, L4-L5 and L5-S1. Endplate osteophytes are seen at L4-L5.  Paraspinal and other soft tissues: Negative. Disc levels: From T12 through L3 there is no significant disc bulge or central canal/neural foraminal stenosis. L3-L4: Broad-based disc bulge and bilateral facet arthropathy with thickening of the ligamentum flavum. Moderate central canal stenosis. No significant neural foraminal stenosis. L4-L5: Broad-based disc bulge with bilateral facet arthropathy. No central canal stenosis. Mild bilateral neural foraminal stenosis. L5-S1: Bilateral facet arthropathy. Moderate bilateral neural foraminal stenosis. No central canal stenosis. Other: There are atherosclerotic calcifications of the aorta. IMPRESSION: 1. No acute fracture or dislocation of the lumbar spine. 2. Multilevel degenerative changes of the lumbar spine, most prominent at L3-L4. 3. 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 4. Moderate bilateral neural foraminal stenosis at L5-S1 and mild bilateral neural foraminal stenosis at L4-L5. Aortic Atherosclerosis (ICD10-I70.0). Electronically Signed   By: Darliss Cheney M.D.   On: 05/04/2023 16:43    Microbiology: Results for orders placed or performed in visit on 08/19/22  Microscopic Examination     Status: Abnormal  Collection Time: 08/19/22 10:40 AM   Urine  Result Value Ref Range Status   WBC, UA 0-5 0 - 5 /hpf Final   RBC, Urine None seen 0 - 2 /hpf Final   Epithelial Cells (non renal) 0-10 0 - 10 /hpf Final   Casts Present (A) None seen /lpf Final   Cast Type Hyaline casts N/A Final   Bacteria, UA None seen None seen/Few Final    Labs: CBC: Recent Labs  Lab 05/26/23 1018  WBC 5.4  HGB 13.3  HCT 39.8  MCV 99.7  PLT 162   Basic Metabolic Panel: Recent Labs  Lab 05/26/23 1018  NA 136  K 3.7  CL 104  CO2 25  GLUCOSE 147*  BUN 15  CREATININE 1.35*  CALCIUM 9.1   Liver Function Tests: No results for input(s): "AST", "ALT", "ALKPHOS", "BILITOT", "PROT", "ALBUMIN" in the last 168 hours. CBG: Recent Labs  Lab 05/26/23 2113  GLUCAP  136*    Discharge time spent: greater than 30 minutes.  Signed: Catarina Hartshorn, MD [facility] 05/27/2023

## 2023-05-27 NOTE — Progress Notes (Signed)
PROGRESS NOTE  [name]:[id number] DOB: 1945-12-26 DOA: 05/26/2023 PCP: Anabel Halon, MD  Brief History:  77 year old male with a history of coronary disease, diabetes mellitus type 2, hypertension, and peripheral arterial disease presenting with chest pain on 05/26/2023.  The patient states that he was washing the dishes when he had some left-sided chest pain that felt like a sharp pinch.  He stated it lasted about 5 seconds and went away.  He decided to take 1 sublingual nitroglycerin anyway.  About 5 to 10 minutes later, he had another episode of left-sided chest pain lasting about 5 seconds.  It had completely resolved.  He decided to take nitroglycerin again after had resolved.  He denied any fevers, chills, shortness breath, nausea, vomiting or diarrhea, diaphoresis.  He denies any recent anginal symptoms.  He denies any worsening dyspnea on exertion.  He is compliant with his aspirin and Plavix. In the ED, the patient was afebrile and hemodynamically stable with oxygen saturation 90% room air.  WBC 5.4, hemoglobin 13.3, platelets 162.  Sodium 136, potassium 3.7, bicarbonate 25, serum creatinine 1.35.  Troponin 8>> 7.  Chest x-ray was negative for any acute findings.  Cardiology was consulted to assist with management.   Assessment/Plan: Chest pain -Appears mostly atypical by history -Troponin 8>> 7 -Cardiology consult -Echocardiogram -Continue Imdur -Continue aspirin and Plavix  Coronary artery disease -status post stenting of his first OM branch with a bare-metal stent Dr. Allyson Sabal 08/28/2010  -Myoview stress test performed 06/28/2020 that showed no ischemia  -Continue aspirin and Plavix   Essential hypertension -Continue Imdur, metoprolol, lisinopril  CKD stage IIIa -Baseline creatinine 1.3-1.4  Peripheral arterial disease Stable. Hx of  Lt SFA PTA 2006.  -Resume aspirin, Plavix        Family Communication:  no Family at bedside  Consultants:   cardiology  Code Status:  FULL   DVT Prophylaxis:  [place] Lovenox   Procedures: As Listed in Progress Note Above  Antibiotics: None       Subjective: The patient had 2 short episodes of chest pain lasting 5 seconds last night.  He denies fevers, chills, headache, shortness of breath, nausea, vomiting, diarrhea, abdominal pain.  Objective: Vitals:   05/26/23 1929 05/26/23 2111 05/26/23 2320 05/27/23 0426  BP: (!) 142/87 133/75 128/72 131/77  Pulse: (!) 56 (!) 58 (!) 57 65  Resp:  20 18 16   Temp:  98.1 F (36.7 C) 98 F (36.7 C) 98.1 F (36.7 C)  TempSrc:  Oral Oral   SpO2:  97% 98% 96%  Weight:      Height:        Intake/Output Summary (Last 24 hours) at 05/27/2023 0800 Last data filed at 05/27/2023 0300 Gross per 24 hour  Intake 360 ml  Output --  Net 360 ml   Weight change:  Exam:  General:  Pt is alert, follows commands appropriately, not in acute distress HEENT: No icterus, No thrush, No neck mass, [place]/AT Cardiovascular: RRR, S1/S2, no rubs, no gallops Respiratory: CTA bilaterally, no wheezing, no crackles, no rhonchi Abdomen: Soft/+BS, non tender, non distended, no guarding Extremities: No edema, No lymphangitis, No petechiae, No rashes, no synovitis   Data Reviewed: I have personally reviewed following labs and imaging studies Basic Metabolic Panel: Recent Labs  Lab 05/26/23 1018  NA 136  K 3.7  CL 104  CO2 25  GLUCOSE 147*  BUN 15  CREATININE 1.35*  CALCIUM 9.1  Liver Function Tests: No results for input(s): "AST", "ALT", "ALKPHOS", "BILITOT", "PROT", "ALBUMIN" in the last 168 hours. No results for input(s): "LIPASE", "AMYLASE" in the last 168 hours. No results for input(s): "AMMONIA" in the last 168 hours. Coagulation Profile: No results for input(s): "INR", "PROTIME" in the last 168 hours. CBC: Recent Labs  Lab 05/26/23 1018  WBC 5.4  HGB 13.3  HCT 39.8  MCV 99.7  PLT 162   Cardiac Enzymes: No results for input(s): "CKTOTAL",  "CKMB", "CKMBINDEX", "TROPONINI" in the last 168 hours. BNP: Invalid input(s): "POCBNP" CBG: Recent Labs  Lab 05/26/23 2113  GLUCAP 136*   HbA1C: No results for input(s): "HGBA1C" in the last 72 hours. Urine analysis:    Component Value Date/Time   COLORURINE YELLOW 05/04/2023 1200   APPEARANCEUR CLEAR 05/04/2023 1200   APPEARANCEUR Clear 08/19/2022 1040   LABSPEC 1.019 05/04/2023 1200   PHURINE 6.0 05/04/2023 1200   GLUCOSEU NEGATIVE 05/04/2023 1200   HGBUR NEGATIVE 05/04/2023 1200   BILIRUBINUR NEGATIVE 05/04/2023 1200   BILIRUBINUR negative 11/15/2022 0922   BILIRUBINUR Negative 08/19/2022 1040   KETONESUR NEGATIVE 05/04/2023 1200   PROTEINUR NEGATIVE 05/04/2023 1200   UROBILINOGEN 1.0 11/15/2022 0922   UROBILINOGEN 0.2 09/10/2010 1753   NITRITE NEGATIVE 05/04/2023 1200   LEUKOCYTESUR NEGATIVE 05/04/2023 1200   Sepsis Labs: @LABRCNTIP (procalcitonin:4,lacticidven:4) )No results found for this or any previous visit (from the past 240 hour(s)).   Scheduled Meds:  aspirin  81 mg Oral Daily   clopidogrel  75 mg Oral Daily   enoxaparin (LOVENOX) injection  40 mg Subcutaneous Q24H   Continuous Infusions:  Procedures/Studies: DG Chest Port 1 View  Result Date: 05/26/2023 CLINICAL DATA:  Chest pain that began 30 minutes ago EXAM: PORTABLE CHEST 1 VIEW COMPARISON:  X-ray 12/03/2022 FINDINGS: No consolidation, pneumothorax or effusion. No edema. Normal cardiopericardial silhouette. Calcified aorta. Degenerative changes of the spine. IMPRESSION: No acute cardiopulmonary disease. Electronically Signed   By: Karen Kays M.D.   On: 05/26/2023 12:54   CT Renal Stone Study  Result Date: 05/04/2023 CLINICAL DATA:  Acute right flank pain for 1 week. EXAM: CT ABDOMEN AND PELVIS WITHOUT CONTRAST TECHNIQUE: Multidetector CT imaging of the abdomen and pelvis was performed following the standard protocol without IV contrast. RADIATION DOSE REDUCTION: This exam was performed according to  the departmental dose-optimization program which includes automated exposure control, adjustment of the mA and/or kV according to patient size and/or use of iterative reconstruction technique. COMPARISON:  August 09, 2008. FINDINGS: Lower chest: No acute abnormality. Hepatobiliary: No focal liver abnormality is seen. No gallstones, gallbladder wall thickening, or biliary dilatation. Pancreas: Unremarkable. No pancreatic ductal dilatation or surrounding inflammatory changes. Spleen: Normal in size without focal abnormality. Adrenals/Urinary Tract: Adrenal glands are unremarkable. Kidneys are normal, without renal calculi, focal lesion, or hydronephrosis. Bladder is unremarkable. Stomach/Bowel: The stomach is unremarkable. There is no evidence of bowel obstruction or inflammation. The appendix is unremarkable. Vascular/Lymphatic: Aortic atherosclerosis. No enlarged abdominal or pelvic lymph nodes. Reproductive: Mild prostatic enlargement. Other: No abdominal wall hernia or abnormality. No abdominopelvic ascites. Musculoskeletal: No acute or significant osseous findings. IMPRESSION: No acute abnormality seen in the abdomen or pelvis. Aortic Atherosclerosis (ICD10-I70.0). Electronically Signed   By: Lupita Raider M.D.   On: 05/04/2023 16:47   CT L-SPINE NO CHARGE  Result Date: 05/04/2023 CLINICAL DATA:  Right flank pain and right lower back pain. EXAM: CT LUMBAR SPINE WITHOUT CONTRAST TECHNIQUE: Multidetector CT imaging of the lumbar spine was performed without intravenous  contrast administration. Multiplanar CT image reconstructions were also generated. RADIATION DOSE REDUCTION: This exam was performed according to the departmental dose-optimization program which includes automated exposure control, adjustment of the mA and/or kV according to patient size and/or use of iterative reconstruction technique. COMPARISON:  None Available. FINDINGS: Segmentation: 5 lumbar type vertebrae. Alignment: Normal. Vertebrae:  The bones are mildly osteopenic. There is no acute fracture or dislocation. There is mild disc space narrowing at L3-L4, L4-L5 and L5-S1. Endplate osteophytes are seen at L4-L5. Paraspinal and other soft tissues: Negative. Disc levels: From T12 through L3 there is no significant disc bulge or central canal/neural foraminal stenosis. L3-L4: Broad-based disc bulge and bilateral facet arthropathy with thickening of the ligamentum flavum. Moderate central canal stenosis. No significant neural foraminal stenosis. L4-L5: Broad-based disc bulge with bilateral facet arthropathy. No central canal stenosis. Mild bilateral neural foraminal stenosis. L5-S1: Bilateral facet arthropathy. Moderate bilateral neural foraminal stenosis. No central canal stenosis. Other: There are atherosclerotic calcifications of the aorta. IMPRESSION: 1. No acute fracture or dislocation of the lumbar spine. 2. Multilevel degenerative changes of the lumbar spine, most prominent at L3-L4. 3. 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 4. Moderate bilateral neural foraminal stenosis at L5-S1 and mild bilateral neural foraminal stenosis at L4-L5. Aortic Atherosclerosis (ICD10-I70.0). Electronically Signed   By: Darliss Cheney M.D.   On: 05/04/2023 16:43    Catarina Hartshorn, DO  [facility]  If 7PM-7AM, please contact night-coverage [email or web address] Password [email or web address] 05/27/2023, 8:00 AM   LOS: 0 days

## 2023-05-27 NOTE — Hospital Course (Signed)
77 year old male with a history of coronary disease, diabetes mellitus type 2, hypertension, and peripheral arterial disease presenting with chest pain on 05/26/2023.  The patient states that he was washing the dishes when he had some left-sided chest pain that felt like a sharp pinch.  He stated it lasted about 5 seconds and went away.  He decided to take 1 sublingual nitroglycerin anyway.  About 5 to 10 minutes later, he had another episode of left-sided chest pain lasting about 5 seconds.  It had completely resolved.  He decided to take nitroglycerin again after had resolved.  He denied any fevers, chills, shortness breath, nausea, vomiting or diarrhea, diaphoresis.  He denies any recent anginal symptoms.  He denies any worsening dyspnea on exertion.  He is compliant with his aspirin and Plavix. In the ED, the patient was afebrile and hemodynamically stable with oxygen saturation 90% room air.  WBC 5.4, hemoglobin 13.3, platelets 162.  Sodium 136, potassium 3.7, bicarbonate 25, serum creatinine 1.35.  Troponin 8>> 7.  Chest x-ray was negative for any acute findings.  Cardiology was consulted to assist with management.

## 2023-05-27 NOTE — Consult Note (Addendum)
Cardiology Consultation   Patient ID: WENDELL WOLLE MRN: [id number]; DOB: [birth year]  Admit date: 05/26/2023 Date of Consult: 05/27/2023  PCP:  Anabel Halon, MD   [facility] Providers Cardiologist:  Nanetta Batty, MD        Patient Profile:   ESTEVAN SHEELER is a 77 y.o. male with a hx of CAD (s/p BMS to OM1 in 2012, NSTEMI in 08/2017 with occlusion of OM stent and unsuccessful attempt at PCI with medical management recommended), PVD (s/p left SFA stenting), HTN, HLD and Type 2 DM who is being seen 05/27/2023 for the evaluation of chest pain at the request of Dr. Wendall Stade.  History of Present Illness:   Mr. Patton was examined by Dr. Allyson Sabal in 10/2022 and reported occasional episodes of chest pain which were overall atypical and most consistent with reflux as they would improve with Protonix. He had also undergone lower extremity dopplers which showed normal ABI's. No changes were made to his cardiac medications and he was continued on Praluent, Plavix 75 mg daily, Imdur 60 mg daily, Lisinopril 5 mg daily, Toprol-XL 25 mg daily and Ranexa 500 mg twice daily.  He presented to [facility] ED on 05/26/2023 for evaluation of chest pain. In talking with the patient today, he reports he is very active at baseline as he has been renovating a house and has been doing most of the labor himself. He denies any chest pain or dyspnea on exertion with this. Yesterday afternoon, he developed chest discomfort while standing in his kitchen. Reports this was a shooting pain which only lasted for a few seconds and spontaneously resolved. He did take nitroglycerin but reports his pain had resolved prior to taking this. He had recurrent pain a few minutes afterwards which prompted him to seek evaluation. He reports recurrent episodes of a shooting pain yesterday evening and overnight which would again only last for a few seconds and resolve. Denies any associated nausea, vomiting or diaphoresis.  Reports his symptoms do not resemble his prior acid reflux as this would last for hours at a time and he tries to avoid foods that trigger symptoms.  Initial labs showed WBC 5.4, Hgb 13.3, platelets 162, Na+ 136, K+ 3.7 and creatinine 1.35 (baseline 1.2-1.3). Initial and repeat hs Troponin values have been negative at 8 and 7.  CXR with no acute cardiopulmonary disease. EKG shows sinus bradycardia with 1st degree AV Block, heart rate 52 with PAC's and no acute ST abnormalities.   Past Medical History:  Diagnosis Date   CHF (congestive heart failure) (HCC)    Coronary artery disease 08/28/2010   s/p multiple caths 2012, BMS PCI OM1 on August 28, 2010-during NSTEMI; January 2019 non-STEMI- occlusion of OM stent (very late stent thrombosis), initial wire crossed with PTCA, but unable to rewire, PCI aborted--> plan medical therapy, normal EF   Diabetes mellitus    GERD (gastroesophageal reflux disease)    Hyperlipidemia    Hypertension    Non-STEMI (non-ST elevated myocardial infarction) [facility]) January 2012 and 2019   a) Jan 2012: 99% OM1 - BMS PCI; b) Jan 2019: Very late stent thrombosis/100% OM1 -after initially causing him for repeat PTCA restoring flow, unable to recross to place stent. - >  Medical therapy.   NSTEMI (non-ST elevated myocardial infarction) (HCC)    Admitted 08/29/17 with NSTEMI-Troponin peak 8.8, normal LVF   PVD (peripheral vascular disease) (HCC)    left SFA PTA & stenting in 02/2005 (Dr. Erlene Quan)  Vitamin D deficiency disease 05/04/2019    Past Surgical History:  Procedure Laterality Date   BIOPSY  08/02/2019   Procedure: BIOPSY;  Surgeon: Corbin Ade, MD;  Location: [facility];  Service: Endoscopy;;  gastric    CARDIAC CATHETERIZATION  12/23/2004   normal L main, normal LAD, normal L Cfx, RCA with 20% hypodense lesion in first end of vessel (Dr. Erlene Quan)   CARDIAC CATHETERIZATION  08/26/2007   no significant CAD by cath, EF 50% (Dr. Evlyn Courier)   COLONOSCOPY   12/2009   Dr. Louie Casa   COLONOSCOPY N/A 01/30/2017   pancolonic diverticulosis, non-bleeding internal hemorrhoids.   COLONOSCOPY WITH PROPOFOL N/A 09/08/2019   Procedure: COLONOSCOPY WITH PROPOFOL;  Surgeon: Corbin Ade, MD;  Location: [facility];  Service: Endoscopy;  Laterality: N/A;  11:15am   CORONARY BALLOON ANGIOPLASTY N/A 08/30/2017   Procedure: CORONARY BALLOON ANGIOPLASTY;  Surgeon: Runell Gess, MD;  Location: [facility] INVAS[facility] CV LAB;  Service: Cardiovascular;  100% CTO very late stent thrombosis OM1 -> initially crossed with PTCA, but then unable to recross after losing my positioning.  PTCA ABORTED.  UNSUCCESSFUL ATTEMPT   CORONARY BALLOON ANGIOPLASTY  02/10/2011   95% prox in-stent restenosis within OM stent - opened with cutting balloon (Dr. Bishop Limbo)   CORONARY STENT INTERVENTION  07/17/2011   in-stent restenosis - re-stented with Promus 2.25x7mm DES (Dr. Ranae Palms)   CORONARY STENT INTERVENTION  08/28/2010   NSTEMI: OM1 99% BMS PCI 2.0x22mm MiniVision BMS (Dr. 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 Erlene Quan)   LEFT HEART CATH AND CORONARY ANGIOGRAPHY N/A 08/30/2017   Procedure: LEFT HEART CATH AND CORONARY ANGIOGRAPHY;  Surgeon: Runell Gess, MD;  [facility]: [facility] IN[facility] CV LAB;  Service: Cardiovascular;  100% very late stent thrombosis of Overlapped BMS-DES OM1 -> attempted PTCA   LEFT HEART CATH AND CORONARY ANGIOGRAPHY  08/28/2010   NSTEMI: OM1 99% BMS PCI  (Dr.  Erlene Quan)   LEFT HEART CATH AND CORONARY ANGIOGRAPHY  09/11/2010   patent stent (Dr. Ranae Palms)   LEFT HEART CATH AND CORONARY ANGIOGRAPHY  02/10/2011   95% prox in-stent restenosis within OM stent  (Dr. Bishop Limbo)   LEFT HEART CATHETERIZATION WITH CORONARY ANGIOGRAM N/A 07/17/2011   Procedure: LEFT HEART CATHETERIZATION WITH CORONARY ANGIOGRAM;  Surgeon: Marykay Lex, MD;  Location: [facility] CATH LAB;  Service: Cardiovascular;  Laterality: N/A;  Right radial approach;  90% ISR of BMS (5 months post PTCA for ISR) --> DES PCI   left knee arthroscopy  05/2016   NM MYOCAR PERF WALL MOTION  09/08/2013   abnormal lexiscan - low to intermediate risk;    POLYPECTOMY  09/08/2019   Procedure: POLYPECTOMY;  Surgeon: Corbin Ade, MD;  [facility];  Service: Endoscopy;;   TOOTH EXTRACTION Right 06/19/2020   TRANSTHORACIC ECHOCARDIOGRAM  09/01/2017   Normal LV size and function.  EF 66 5%.  Normal wall motion.  GR 1 DD.  Mild aortic sclerosis.  Aortic root mildly dilated at 40 mm.  Home Medications:  Prior to Admission medications   Medication Sig Start Date End Date Taking? Authorizing Provider  acetaminophen (TYLENOL) 500 MG tablet Take 500 mg by mouth every 6 (six) hours as needed for moderate pain (pain score 4-6).   Yes [provider]  Alirocumab (PRALUENT) 75 MG/ML SOAJ Inject 1 Dose into the skin every 14 (fourteen) days.   Yes [provider]  aspirin 81 MG chewable tablet Chew 1 tablet (81 mg total) by mouth daily. 09/03/17  Yes Laverda Page B, NP  Cholecalciferol (VITAMIN D3) 50 MCG (2000 UT) TABS Take 3 tablets by mouth daily.   Yes [provider]  clopidogrel (PLAVIX) 75 MG tablet TAKE ONE TABLET BY MOUTH ONCE DAILY. 12/07/15  Yes Runell Gess, MD  cyclobenzaprine (FLEXERIL) 10 MG tablet Take 1 tablet by mouth at bedtime. 10/31/22  Yes [provider]  diclofenac Sodium (VOLTAREN) 1 % GEL Apply topically as needed. 09/07/20  Yes [provider]  docusate sodium (COLACE) 100 MG capsule Take 100 mg by mouth every other day.   Yes [provider]  glipiZIDE-metformin (METAGLIP) 5-500 MG tablet TAKE (1) TABLET BY MOUTH ONCE DAILY AT 12 NOON. 03/25/23  Yes Anabel Halon, MD  ibuprofen (ADVIL) 200 MG tablet Take 200 mg by mouth every 6 (six) hours as needed for mild pain (pain score 1-3).   Yes [provider]  isosorbide mononitrate (IMDUR) 60 MG 24 hr tablet Take 1 tablet (60 mg total) by mouth daily. 06/21/19  Yes Runell Gess, MD  lisinopril (PRINIVIL,ZESTRIL) 5 MG tablet Take 5 mg by mouth daily.   Yes [provider]  metoprolol succinate (TOPROL-XL) 25 MG 24 hr tablet TAKE 2 TABLETS BY MOUTH ONCE DAILY. 02/23/23  Yes Runell Gess, MD  nitroGLYCERIN (NITROSTAT) 0.4 MG SL tablet DISSOLVE 1 TABLET UNDER TONGUE EVERY 5 MINUTES UP TO 15 MIN FOR CHEST PAIN. IF NO RELIEF CALL 911. 10/27/22  Yes Runell Gess, MD  omeprazole (PRILOSEC) 20 MG capsule Take 20 mg by mouth as needed (acid reflux).   Yes [provider]  pantoprazole (PROTONIX) 40 MG tablet Take 40 mg by mouth daily.   Yes [provider]  ranolazine (RANEXA) 500 MG 12 hr tablet Take 500 mg by mouth 2 (two) times daily.   Yes [provider]  Semaglutide (RYBELSUS) 7 MG TABS Take 1 tablet (7 mg total) by mouth daily. 09/10/22  Yes Anabel Halon, MD  sucralfate (CARAFATE) 1 GM/10ML suspension Take 10 mLs (1 g total) by mouth 4 (four) times daily. Patient taking differently: Take 1 g by mouth as needed (acid reflux). 07/02/22  Yes Gelene Mink, NP  ONETOUCH ULTRA test strip USE AS DIRECTED UP TO 3 TIMES DAILY IF NEEDED. 03/28/20   Wilson Singer, MD  predniSONE (DELTASONE) 20 MG tablet Take 1 tablet (20 mg total) by mouth daily with breakfast. 05/14/23   Anabel Halon, MD    Inpatient Medications: Scheduled Meds:  aspirin  81 mg Oral Daily   clopidogrel  75 mg Oral Daily   enoxaparin (LOVENOX)  injection  40 mg Subcutaneous Q24H   Continuous Infusions:  PRN Meds: hydrALAZINE, nitroGLYCERIN, ondansetron **OR** ondansetron (ZOFRAN) IV, polyethylene glycol  Allergies:    Allergies  Allergen Reactions   Crestor [Rosuvastatin]     myalgia   Jardiance [Empagliflozin]     Penile swelling and rash   Propoxyphene N-Acetaminophen Nausea Only   Statins Other (See Comments)  Severe muscle cramping/aching/pain/ elevated CK   Tape     Blisters   Zetia [Ezetimibe]     Muscle cramping   Zocor [Simvastatin]     myalgia   Flomax [Tamsulosin Hcl] Rash   Flomax [Tamsulosin] Rash   Levaquin [Levofloxacin Hemihydrate] Rash   Levofloxacin Rash   Penicillin G Diarrhea and Rash   Penicillins Rash    Broke out in rash 6 years ago, pt recently took penicillin (09/2016) and had no reaction immediate rash, facial/tongue/throat swelling, SOB or lightheadedness with hypotension    Social History:   Social History   Socioeconomic History   Marital status: Married    Spouse name: [name]   Number of children: 3   Years of education: 12   Highest education level: Some college, no degree  Occupational History   Occupation: Retired    Associate Professor: [employer]  Tobacco Use   Smoking status: Former    Current packs/day: 0.00    Average packs/day: 1 pack/day for 30.0 years (30.0 ttl pk-yrs)    Types: Cigarettes    Start date: 05/11/1969    Quit date: 05/12/1999    Years since quitting: 24.0    Passive exposure: Past   Smokeless tobacco: Never  Vaping Use   Vaping status: Never Used  Substance and Sexual Activity   Alcohol use: No   Drug use: No   Sexual activity: Yes  Other Topics Concern   Not on file  Social History Narrative   Married for 49 years.Lives with wife.Retired,ex-lab Pensions consultant.Ex-Marine,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   Social Determinants of Health   Financial Resource Strain: Low Risk  (09/15/2022)   Overall Financial Resource Strain (CARDIA)    Difficulty of Paying  Living Expenses: Not hard at all  Food Insecurity: No Food Insecurity (05/26/2023)   Hunger Vital Sign    Worried About Running Out of Food in the Last Year: Never true    Ran Out of Food in the Last Year: Never true  Transportation Needs: No Transportation Needs (05/26/2023)   PRAPARE - Administrator, Civil Service (Medical): No    Lack of Transportation (Non-Medical): No  Physical Activity: Insufficiently Active (09/15/2022)   Exercise Vital Sign    Days of Exercise per Week: 3 days    Minutes of Exercise per Session: 30 min  Stress: No Stress Concern Present (09/15/2022)   [employer] of Occupational Health - Occupational Stress Questionnaire    Feeling of Stress : Not at all  Social Connections: Moderately Integrated (09/15/2022)   Social Connection and Isolation Panel [NHANES]    Frequency of Communication with Friends and Family: More than three times a week    Frequency of Social Gatherings with Friends and Family: More than three times a week    Attends Religious Services: More than 4 times per year    Active Member of [employer] or Organizations: No    Attends Banker Meetings: Never    Marital Status: Married  Catering manager Violence: Not At Risk (05/26/2023)   Humiliation, Afraid, Rape, and Kick questionnaire    Fear of Current or Ex-Partner: No    Emotionally Abused: No    Physically Abused: No    Sexually Abused: No    Family History:    Family History  Problem Relation Age of Onset   Arrhythmia Mother 30   Early death Son    Colon cancer Neg Hx    Liver disease Neg Hx    Inflammatory bowel  disease Neg Hx    Colon polyps Neg Hx      ROS:  Please see the history of present illness.   All other ROS reviewed and negative.     Physical Exam/Data:   Vitals:   05/26/23 1929 05/26/23 2111 05/26/23 2320 05/27/23 0426  BP: (!) 142/87 133/75 128/72 131/77  Pulse: (!) 56 (!) 58 (!) 57 65  Resp:  20 18 16   Temp:  98.1 F (36.7 C) 98 F  (36.7 C) 98.1 F (36.7 C)  TempSrc:  Oral Oral   SpO2:  97% 98% 96%  Weight:      Height:        Intake/Output Summary (Last 24 hours) at 05/27/2023 0744 Last data filed at 05/27/2023 0300 Gross per 24 hour  Intake 360 ml  Output --  Net 360 ml      05/26/2023   10:05 AM 05/14/2023    2:30 PM 01/14/2023    2:08 PM  Last 3 Weights  Weight (lbs) 209 lb 10.5 oz 209 lb 12.8 oz 213 lb  Weight (kg) 95.1 kg 95.165 kg 96.616 kg     Body mass index is 28.43 kg/m.  General:  Pleasant male appearing in no acute distress HEENT: normal Neck: no JVD Vascular: No carotid bruits; Distal pulses 2+ bilaterally Cardiac:  normal S1, S2; RRR; no murmur  Lungs:  clear to auscultation bilaterally, no wheezing, rhonchi or rales  Abd: soft, nontender, no hepatomegaly  Ext: no pitting edema Musculoskeletal:  No deformities, BUE and BLE strength normal and equal Skin: warm and dry  Neuro:  CNs 2-12 intact, no focal abnormalities noted Psych:  Normal affect   EKG:  The EKG was personally reviewed and demonstrates: Sinus bradycardia with 1st degree AV Block, heart rate 52 with PAC's and no acute ST abnormalities.  Telemetry:  Telemetry was personally reviewed and demonstrates: Sinus bradycardia, heart rate in 50's to 60's.  Relevant CV Studies:  Cardiac Catheterization: 08/2017 Ost 1st Mrg to 1st Mrg lesion is 100% stenosed. Post intervention, there is a 100% residual stenosis. The left ventricular systolic function is normal. LV end diastolic pressure is normal. The left ventricular ejection fraction is 50-55% by visual estimate. IMPRESSION: Unsuccessful circumflex obtuse marginal Tanaysha Alkins PCI of an occluded previously placed marginal Naeemah Jasmer stent of the right foot in January 2012. I was unable to rewire the vessel after initial angioplasty. He had normal LV function. At this point, I recommend continue medical therapy. He was pain-free at the end of the procedure. The sheath was removed and a TR  band was placed on the right wrist which is patent hemostasis. Angiomax was discontinued.   NST: 06/2020 The left ventricular ejection fraction is mildly decreased (45-54%). Nuclear stress EF: 48%. There was no ST segment deviation noted during stress. No T wave inversion was noted during stress. Defect 1: There is a defect present in the basal inferior, mid inferoseptal, mid inferior, apical inferior and apex location. Defect 2: There is a medium defect of moderate severity present in the basal anterolateral and mid anterolateral location.   There are two defects noted. The inferior/inferoseptal defect is not reversible and similar to the prior study. This has previously been suggested to be artifact, though mild infarct cannot be excluded. The second defect is new compared to prior study and correlates to a wall motion abnormality. This is consistent with infarct. The is no appreciable peri-infarct ischemia.  Laboratory Data:  High Sensitivity Troponin:   Recent Labs  Lab 05/26/23 1018 05/26/23 1157  TROPONINIHS 8 7     Chemistry Recent Labs  Lab 05/26/23 1018  NA 136  K 3.7  CL 104  CO2 25  GLUCOSE 147*  BUN 15  CREATININE 1.35*  CALCIUM 9.1  GFRNONAA 55*  ANIONGAP 7    No results for input(s): "PROT", "ALBUMIN", "AST", "ALT", "ALKPHOS", "BILITOT" in the last 168 hours. Lipids No results for input(s): "CHOL", "TRIG", "HDL", "LABVLDL", "LDLCALC", "CHOLHDL" in the last 168 hours.  Hematology Recent Labs  Lab 05/26/23 1018  WBC 5.4  RBC 3.99*  HGB 13.3  HCT 39.8  MCV 99.7  MCH 33.3  MCHC 33.4  RDW 12.3  PLT 162   Thyroid No results for input(s): "TSH", "FREET4" in the last 168 hours.  BNPNo results for input(s): "BNP", "PROBNP" in the last 168 hours.  DDimer No results for input(s): "DDIMER" in the last 168 hours.   Radiology/Studies:  DG Chest Port 1 View  Result Date: 05/26/2023 CLINICAL DATA:  Chest pain that began 30 minutes ago EXAM: PORTABLE CHEST 1  VIEW COMPARISON:  X-ray 12/03/2022 FINDINGS: No consolidation, pneumothorax or effusion. No edema. Normal cardiopericardial silhouette. Calcified aorta. Degenerative changes of the spine. IMPRESSION: No acute cardiopulmonary disease. Electronically Signed   By: Karen Kays M.D.   On: 05/26/2023 12:54     Assessment and Plan:   1. Chest Pain with Atypical Qualities - Presented with episodes of a shooting chest pain as outlined above which only last for a few seconds and spontaneously resolve. No association with exertion. Troponin values have been negative and EKG is without acute ST changes. Reviewed with Dr. Wyline Mood and will plan for a [facility] for ischemic evaluation given that he continues to have intermittent episodes of chest pain this morning. An echocardiogram has also been ordered by the admitting team and is pending at this time.  2. CAD - He underwent BMS to OM1 in 2012 and did have an NSTEMI in 08/2017 with occlusion of OM stent and unsuccessful attempt at PCI with medical management recommended. - Will plan for repeat stress testing as outlined above. Continue PTA Plavix 75mg  daily, Imdur 60 mg daily, Lisinopril 5 mg daily, Ranexa 500 mg twice daily and ASA 81 mg daily. He has been intolerant to statins and is on Praluent as an outpatient. He is also on Toprol-XL 50 mg daily which has been held at this time given his stress test. Due to bradycardia, can consider reducing dosing to 25 mg daily at discharge.  3. PVD - He previously underwent left SFA stenting and denies any recent claudication. Remains on ASA, Plavix and Praulent.   4. HTN - BP was elevated at 179/91 yesterday, improved to 131/77 on most recent check. He has been continued on Lisinopril 5 mg daily. Toprol-XL was held at the time of admission given plans for stress testing. As discussed above, would consider reducing dosing from 50 mg daily to 25 mg daily given his heart rate in the 50's.  5. HLD - He has been  intolerant to statins and remains on Praluent.   For questions or updates, please contact [facility] Please consult [email or web address] for contact info under    Signed, Ellsworth Lennox, PA-C  05/27/2023 7:44 AM  Attending note Patient seen and discussed with PA Iran Ouch, I agree with her documentation. 77 yo male history of CAD s/p BMS to OM1 in 2012, NSTEMI in 08/2017 with occlusion of OM stent and unsuccessful attempt at  PCI with medical management recommended , PAD with prior stenting, HTN, HLD, DM2, admitted with chest pain   K 3.7 Cr 1.35 BUN 15 WBC 5.4 Hgb 13.3 Plt 162  Trop 8-->7 EKG SR no acute ischemic changes CXR  no acute process   Chest pain/History of CAD -prior history of CAD as outlined above - presented with chest pains, some atypical features.  - no objective evidence of ischemia by EKG or enzymes - nuclear stress test this admit is benign, no ischemia - can d/c echo, no need to hold him longer today to complete  -resume home regimen, can lower toprol to 25mg  daily due to some low HRs here  [facility] for discharge from cardiac standpoint. We will arrange outpatient f/u

## 2023-05-27 NOTE — Plan of Care (Signed)
  Problem: Education: Goal: Knowledge of General Education information will improve Description Including pain rating scale, medication(s)/side effects and non-pharmacologic comfort measures Outcome: Progressing   Problem: Health Behavior/Discharge Planning: Goal: Ability to manage health-related needs will improve Outcome: Progressing   

## 2023-06-11 ENCOUNTER — Encounter: Payer: Self-pay | Admitting: Internal Medicine

## 2023-06-11 ENCOUNTER — Ambulatory Visit (INDEPENDENT_AMBULATORY_CARE_PROVIDER_SITE_OTHER): Payer: PPO | Admitting: Internal Medicine

## 2023-06-11 VITALS — BP 122/67 | HR 69 | Ht 72.0 in | Wt 207.0 lb

## 2023-06-11 DIAGNOSIS — R079 Chest pain, unspecified: Secondary | ICD-10-CM | POA: Diagnosis not present

## 2023-06-11 DIAGNOSIS — Z09 Encounter for follow-up examination after completed treatment for conditions other than malignant neoplasm: Secondary | ICD-10-CM | POA: Diagnosis not present

## 2023-06-11 DIAGNOSIS — N1831 Chronic kidney disease, stage 3a: Secondary | ICD-10-CM | POA: Diagnosis not present

## 2023-06-11 DIAGNOSIS — I2510 Atherosclerotic heart disease of native coronary artery without angina pectoris: Secondary | ICD-10-CM | POA: Diagnosis not present

## 2023-06-11 DIAGNOSIS — I2089 Other forms of angina pectoris: Secondary | ICD-10-CM

## 2023-06-11 DIAGNOSIS — Z9861 Coronary angioplasty status: Secondary | ICD-10-CM

## 2023-06-11 NOTE — Assessment & Plan Note (Signed)
Last BMP showed GFR of 55 during hospital visit, has been around 55 lately Maintain adequate hydration On ACEi Avoid nephrotoxic agents Check BMP

## 2023-06-11 NOTE — Progress Notes (Signed)
Established Patient Office Visit  Subjective:  Patient ID: [name], male    DOB: 10-12-1945  Age: 77 y.o. MRN: [id number]  CC:  Chief Complaint  Patient presents with   Follow-up    ER follow up     HPI [name] is a 77 y.o. male with past medical history of HTN, CAD, PAD, type II DM, GERD, HLD and hypothyroidism who presents for follow-up after recent hospitalization from 05/26/23-05/27/23.  He presented with chest pain on 05/26/2023. The patient states that he was washing the dishes when he had some left-sided chest pain that felt like a sharp pinch. He stated it lasted about 5 seconds and went away. He decided to take 1 sublingual nitroglycerin anyway. About 5 to 10 minutes later, he had another episode of left-sided chest pain lasting about 5 seconds. It had completely resolved. He decided to take nitroglycerin again after had resolved. He denied any fevers, chills, shortness breath, nausea, vomiting or diarrhea, diaphoresis. He denies any recent anginal symptoms. He denies any worsening dyspnea on exertion. He is compliant with his aspirin and Plavix.   Troponin was 8>> 7. Cardiology consulted.  -05/27/23 myoview-normal, low risk>>cleared by cardiology for dc -Continued Imdur, aspirin and Plavix -status post stenting of his first OM branch with a bare-metal stent Dr. Allyson Sabal 08/28/2010.  Today, he denies any chest pain, dyspnea or palpitations.  He denies any recurrent chest pain episode since being discharged.  Past Medical History:  Diagnosis Date   CHF (congestive heart failure) (HCC)    Coronary artery disease 08/28/2010   s/p multiple caths 2012, BMS PCI OM1 on August 28, 2010-during NSTEMI; January 2019 non-STEMI- occlusion of OM stent (very late stent thrombosis), initial wire crossed with PTCA, but unable to rewire, PCI aborted--> plan medical therapy, normal EF   Diabetes mellitus    GERD (gastroesophageal reflux disease)    Hyperlipidemia    Hypertension     Non-STEMI (non-ST elevated myocardial infarction) [facility]) January 2012 and 2019   a) Jan 2012: 99% OM1 - BMS PCI; b) Jan 2019: Very late stent thrombosis/100% OM1 -after initially causing him for repeat PTCA restoring flow, unable to recross to place stent. - >  Medical therapy.   NSTEMI (non-ST elevated myocardial infarction) (HCC)    Admitted 08/29/17 with NSTEMI-Troponin peak 8.8, normal LVF   PVD (peripheral vascular disease) (HCC)    left SFA PTA & stenting in 02/2005 (Dr. Erlene Quan)   Vitamin D deficiency disease 05/04/2019    Past Surgical History:  Procedure Laterality Date   BIOPSY  08/02/2019   Procedure: BIOPSY;  Surgeon: Corbin Ade, MD;  Location: [facility];  Service: Endoscopy;;  gastric    CARDIAC CATHETERIZATION  12/23/2004   normal L main, normal LAD, normal L Cfx, RCA with 20% hypodense lesion in first end of vessel (Dr. Erlene Quan)   CARDIAC CATHETERIZATION  08/26/2007   no significant CAD by cath, EF 50% (Dr. Evlyn Courier)   COLONOSCOPY  12/2009   Dr. Louie Casa   COLONOSCOPY N/A 01/30/2017   pancolonic diverticulosis, non-bleeding internal hemorrhoids.   COLONOSCOPY WITH PROPOFOL N/A 09/08/2019   Procedure: COLONOSCOPY WITH PROPOFOL;  Surgeon: Corbin Ade, MD;  [facility]: [facility];  Service: Endoscopy;  Laterality: N/A;  11:15am   CORONARY BALLOON ANGIOPLASTY N/A 08/30/2017   Procedure: CORONARY BALLOON ANGIOPLASTY;  Surgeon: Runell Gess, MD;  [facility]: [facility];  Service: Cardiovascular;  100% CTO very late stent thrombosis  OM1 -> initially crossed with PTCA, but then unable to recross after losing my positioning.  PTCA ABORTED.  UNSUCCESSFUL ATTEMPT   CORONARY BALLOON ANGIOPLASTY  02/10/2011   95% prox in-stent restenosis within OM stent - opened with cutting balloon (Dr. Bishop Limbo)   CORONARY STENT INTERVENTION  07/17/2011   in-stent restenosis - re-stented with Promus 2.25x33mm DES (Dr. Ranae Palms)   CORONARY STENT INTERVENTION  08/28/2010    NSTEMI: OM1 99% BMS PCI 2.0x31mm MiniVision BMS (Dr. 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 Erlene Quan)   LEFT HEART CATH AND CORONARY ANGIOGRAPHY N/A 08/30/2017   Procedure: LEFT HEART CATH AND CORONARY ANGIOGRAPHY;  Surgeon: Runell Gess, MD;  Location: [facility];  Service: Cardiovascular;  100% very late stent thrombosis of Overlapped BMS-DES OM1 -> attempted PTCA   LEFT HEART CATH AND CORONARY ANGIOGRAPHY  08/28/2010   NSTEMI: OM1 99% BMS PCI  (Dr. Erlene Quan)   LEFT HEART CATH AND CORONARY ANGIOGRAPHY  09/11/2010   patent stent (Dr. Ranae Palms)   LEFT HEART CATH AND CORONARY ANGIOGRAPHY  02/10/2011   95% prox in-stent restenosis within OM stent  (Dr. Bishop Limbo)   LEFT HEART CATHETERIZATION WITH CORONARY ANGIOGRAM N/A 07/17/2011   Procedure: LEFT HEART CATHETERIZATION WITH CORONARY ANGIOGRAM;  Surgeon: Marykay Lex, MD;  Location: [facility] CATH LAB;  Service: Cardiovascular;  Laterality: N/A;  Right radial approach;  90% ISR of BMS (5 months post PTCA for ISR) --> DES PCI   left knee arthroscopy  05/2016   NM MYOCAR PERF WALL MOTION  09/08/2013   abnormal lexiscan - low to intermediate risk;    POLYPECTOMY  09/08/2019   Procedure: POLYPECTOMY;  Surgeon: Corbin Ade, MD;  [facility];  Service: Endoscopy;;   TOOTH EXTRACTION Right 06/19/2020   TRANSTHORACIC ECHOCARDIOGRAM  09/01/2017   Normal LV size and function.  EF 66 5%.  Normal wall motion.  GR 1 DD.  Mild aortic  sclerosis.  Aortic root mildly dilated at 40 mm.    Family History  Problem Relation Age of Onset   Arrhythmia Mother 40   Early death Son    Colon cancer Neg Hx    Liver disease Neg Hx    Inflammatory bowel disease Neg Hx    Colon polyps Neg Hx     Social History   Socioeconomic History   Marital status: Married    Spouse name: [name]   Number of children: 3   Years of education: 12   Highest education level: Some college, no degree  Occupational History   Occupation: Retired    Associate Professor: [employer]  Tobacco Use   Smoking status: Former    Current packs/day: 0.00    Average packs/day: 1 pack/day for 30.0 years (30.0 ttl pk-yrs)    Types: Cigarettes    Start date: 05/11/1969    Quit date: 05/12/1999    Years since quitting: 24.0    Passive exposure: Past   Smokeless tobacco: Never  Vaping Use   Vaping status: Never Used  Substance and Sexual Activity   Alcohol use: No   Drug use: No   Sexual activity: Yes  Other Topics Concern   Not on file  Social History Narrative   Married for 49 years.Lives with wife.Retired,ex-lab Pensions consultant.Ex-Marine,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   Social Determinants of Health   Financial Resource Strain: Low Risk  (09/15/2022)   Overall Financial Resource Strain (CARDIA)    Difficulty of Paying Living Expenses: Not hard at all  Food Insecurity: No Food Insecurity (05/26/2023)   Hunger Vital Sign    Worried About Running Out of Food in the Last Year: Never true    Ran Out of Food in the Last Year: Never true  Transportation Needs: No Transportation Needs (05/26/2023)   PRAPARE - Administrator, Civil Service (Medical): No    Lack of Transportation (Non-Medical): No  Physical Activity: Insufficiently Active (09/15/2022)   Exercise Vital Sign    Days of Exercise per Week: 3 days    Minutes of Exercise per Session: 30 min  Stress: No Stress Concern Present (09/15/2022)   [employer] of Occupational Health - Occupational  Stress Questionnaire    Feeling of Stress : Not at all  Social Connections: Moderately Integrated (09/15/2022)   Social Connection and Isolation Panel [NHANES]    Frequency of Communication with Friends and Family: More than three times a week    Frequency of Social Gatherings with Friends and Family: More than three times a week    Attends Religious Services: More than 4 times per year    Active Member of [employer] or Organizations: No    Attends Banker Meetings: Never    Marital Status: Married  Catering manager Violence: Not At Risk (05/26/2023)   Humiliation, Afraid, Rape, and Kick questionnaire    Fear of Current or Ex-Partner: No    Emotionally Abused: No    Physically Abused: No    Sexually Abused: No    Outpatient Medications Prior to Visit  Medication Sig Dispense Refill   acetaminophen (TYLENOL) 500 MG tablet Take 500 mg by mouth every 6 (six) hours as needed for moderate pain (pain score 4-6).     Alirocumab (PRALUENT) 75 MG/ML SOAJ Inject 1 Dose into the skin every 14 (fourteen) days.     aspirin 81 MG chewable tablet Chew 1 tablet (81 mg total) by mouth daily.     Cholecalciferol (VITAMIN D3) 50 MCG (2000 UT) TABS Take 3 tablets by mouth daily.     clopidogrel (PLAVIX) 75 MG tablet TAKE ONE TABLET BY MOUTH ONCE DAILY. 30 tablet 9   cyclobenzaprine (FLEXERIL) 10 MG tablet Take 1 tablet by mouth at bedtime.     diclofenac Sodium (VOLTAREN) 1 % GEL Apply topically as needed.     docusate sodium (COLACE) 100 MG capsule Take 100 mg by mouth every other day.     glipiZIDE-metformin (METAGLIP) 5-500 MG tablet TAKE (1) TABLET BY MOUTH ONCE DAILY AT 12 NOON. 90 tablet 0   ibuprofen (ADVIL) 200 MG tablet Take 200 mg by mouth every 6 (six) hours as needed for mild pain (pain score 1-3).     isosorbide mononitrate (IMDUR) 60 MG 24 hr tablet Take 1 tablet (60 mg total) by mouth daily. 90 tablet 3   lisinopril (PRINIVIL,ZESTRIL) 5 MG tablet Take 5 mg by mouth daily.      metoprolol succinate (TOPROL-XL) 25 MG 24 hr tablet TAKE 2 TABLETS BY MOUTH ONCE DAILY. 180 tablet 1   nitroGLYCERIN (NITROSTAT)  0.4 MG SL tablet DISSOLVE 1 TABLET UNDER TONGUE EVERY 5 MINUTES UP TO 15 MIN FOR CHEST PAIN. IF NO RELIEF CALL 911. 25 tablet 4   omeprazole (PRILOSEC) 20 MG capsule Take 20 mg by mouth as needed (acid reflux).     ONETOUCH ULTRA test strip USE AS DIRECTED UP TO 3 TIMES DAILY IF NEEDED. 100 strip 3   pantoprazole (PROTONIX) 40 MG tablet Take 40 mg by mouth daily.     ranolazine (RANEXA) 500 MG 12 hr tablet Take 500 mg by mouth 2 (two) times daily.     Semaglutide (RYBELSUS) 7 MG TABS Take 1 tablet (7 mg total) by mouth daily. 90 tablet 3   sucralfate (CARAFATE) 1 GM/10ML suspension Take 10 mLs (1 g total) by mouth 4 (four) times daily. (Patient taking differently: Take 1 g by mouth as needed (acid reflux).) 420 mL 1   No facility-administered medications prior to visit.    Allergies  Allergen Reactions   Crestor [Rosuvastatin]     myalgia   Jardiance [Empagliflozin]     Penile swelling and rash   Propoxyphene N-Acetaminophen Nausea Only   Statins Other (See Comments)    Severe muscle cramping/aching/pain/ elevated CK   Tape     Blisters   Zetia [Ezetimibe]     Muscle cramping   Zocor [Simvastatin]     myalgia   Flomax [Tamsulosin Hcl] Rash   Flomax [Tamsulosin] Rash   Levaquin [Levofloxacin Hemihydrate] Rash   Levofloxacin Rash   Penicillin G Diarrhea and Rash   Penicillins Rash    Broke out in rash 6 years ago, pt recently took penicillin (09/2016) and had no reaction immediate rash, facial/tongue/throat swelling, SOB or lightheadedness with hypotension    ROS Review of Systems  Constitutional:  Negative for chills and fever.  HENT:  Negative for congestion and sore throat.   Eyes:  Negative for pain and discharge.  Respiratory:  Negative for cough and shortness of breath.   Cardiovascular:  Negative for chest pain and palpitations.   Gastrointestinal:  Negative for diarrhea, nausea and vomiting.  Endocrine: Negative for polydipsia and polyuria.  Genitourinary:  Negative for dysuria and hematuria.       Nocturia  Musculoskeletal:  Positive for back pain. Negative for neck pain and neck stiffness.  Skin:  Negative for rash.  Neurological:  Negative for dizziness, weakness, numbness and headaches.  Psychiatric/Behavioral:  Negative for agitation and behavioral problems.       Objective:    Physical Exam Vitals reviewed.  Constitutional:      General: He is not in acute distress.    Appearance: He is not diaphoretic.  HENT:     Head: Normocephalic and atraumatic.     Nose: Nose normal.     Mouth/Throat:     Mouth: Mucous membranes are moist.  Eyes:     General: No scleral icterus.    Extraocular Movements: Extraocular movements intact.  Cardiovascular:     Rate and Rhythm: Normal rate and regular rhythm.     Pulses: Normal pulses.     Heart sounds: Normal heart sounds. No murmur heard. Pulmonary:     Breath sounds: Normal breath sounds. No wheezing or rales.  Abdominal:     Palpations: Abdomen is soft.     Tenderness: There is no abdominal tenderness.  Musculoskeletal:     Cervical back: Neck supple. No tenderness.     Lumbar back: Tenderness (Right paraspinal) present. Negative right straight leg raise test and  negative left straight leg raise test.     Right lower leg: No edema.     Left lower leg: No edema.  Skin:    General: Skin is warm.     Findings: No rash.  Neurological:     General: No focal deficit present.     Mental Status: He is alert and oriented to person, place, and time.     Sensory: No sensory deficit.     Motor: No weakness.  Psychiatric:        Mood and Affect: Mood normal.        Behavior: Behavior normal.     BP 122/67 (BP Location: Left Arm, Patient Position: Sitting, Cuff Size: Large)   Pulse 69   Ht 6' (1.829 m)   Wt 207 lb (93.9 kg)   SpO2 92%   BMI 28.07 kg/m   Wt Readings from Last 3 Encounters:  06/11/23 207 lb (93.9 kg)  05/26/23 209 lb 10.5 oz (95.1 kg)  05/14/23 209 lb 12.8 oz (95.2 kg)    Lab Results  Component Value Date   TSH 0.670 01/14/2023   Lab Results  Component Value Date   WBC 5.4 05/26/2023   HGB 13.3 05/26/2023   HCT 39.8 05/26/2023   MCV 99.7 05/26/2023   PLT 162 05/26/2023   Lab Results  Component Value Date   NA 136 05/26/2023   K 3.7 05/26/2023   CO2 25 05/26/2023   GLUCOSE 147 (H) 05/26/2023   BUN 15 05/26/2023   CREATININE 1.35 (H) 05/26/2023   BILITOT 1.7 (H) 05/04/2023   ALKPHOS 32 (L) 05/04/2023   AST 18 05/04/2023   ALT 17 05/04/2023   PROT 6.9 05/04/2023   ALBUMIN 3.9 05/04/2023   CALCIUM 9.1 05/26/2023   ANIONGAP 7 05/26/2023   EGFR 49 (L) 01/14/2023   Lab Results  Component Value Date   CHOL 159 08/12/2022   Lab Results  Component Value Date   HDL 36 (L) 08/12/2022   Lab Results  Component Value Date   LDLCALC 76 08/12/2022   Lab Results  Component Value Date   TRIG 287 (H) 08/12/2022   Lab Results  Component Value Date   CHOLHDL 4.4 08/12/2022   Lab Results  Component Value Date   HGBA1C 5.5 05/14/2023      Assessment & Plan:   Problem List Items Addressed This Visit       Cardiovascular and Mediastinum   CAD S/P percutaneous coronary angioplasty (Chronic)    S/p stent placement Followed by Dr. Sharlett Iles On aspirin, Plavix and Praluent On beta-blocker, Imdur and Ranexa Denies any chest pain currently      Atypical angina (HCC)    On Ranexa and Imdur - denies chest pain episodes now Has Nitroglycerin PRN Followed by Cardiology        Genitourinary   Stage 3a chronic kidney disease (HCC)    Last BMP showed GFR of 55 during hospital visit, has been around 55 lately Maintain adequate hydration On ACEi Avoid nephrotoxic agents Check BMP      Relevant Orders   CBC   Basic Metabolic Panel (BMET)     Other   Chest pain - Primary    Had an  episode of chest pain before the hospital visit Unlikely to be cardiac in origin, considering Myoview was normal, troponin was also wnl On Imdur for atypical angina Has nitroglycerin as needed for angina       Hospital discharge follow-up    [facility]  chart reviewed, including discharge summary Medications reconciled and reviewed with the patient in detail      Relevant Orders   CBC   Basic Metabolic Panel (BMET)    No orders of the defined types were placed in this encounter.   Follow-up: Return if symptoms worsen or fail to improve.    Anabel Halon, MD

## 2023-06-11 NOTE — Assessment & Plan Note (Signed)
Had an episode of chest pain before the hospital visit Unlikely to be cardiac in origin, considering Myoview was normal, troponin was also wnl On Imdur for atypical angina Has nitroglycerin as needed for angina

## 2023-06-11 NOTE — Assessment & Plan Note (Signed)
Hospital chart reviewed, including discharge summary Medications reconciled and reviewed with the patient in detail 

## 2023-06-11 NOTE — Patient Instructions (Signed)
Please continue to take medications as prescribed.  Please continue to follow low carb diet and perform moderate exercise/walking at least 150 mins/week.  Okay to take additional dose of Pantoprazole or Pepcid as needed for upper abdominal discomfort.

## 2023-06-11 NOTE — Assessment & Plan Note (Signed)
On Ranexa and Imdur - denies chest pain episodes now Has Nitroglycerin PRN Followed by Cardiology

## 2023-06-11 NOTE — Assessment & Plan Note (Signed)
S/p stent placement °Followed by Dr. Berry-cardiology °On aspirin, Plavix and Praluent °On beta-blocker, Imdur and Ranexa °Denies any chest pain currently °

## 2023-06-12 LAB — CBC
Hematocrit: 42 % (ref 37.5–51.0)
Hemoglobin: 13.9 g/dL (ref 13.0–17.7)
MCH: 33.4 pg — ABNORMAL HIGH (ref 26.6–33.0)
MCHC: 33.1 g/dL (ref 31.5–35.7)
MCV: 101 fL — ABNORMAL HIGH (ref 79–97)
Platelets: 166 10*3/uL (ref 150–450)
RBC: 4.16 x10E6/uL (ref 4.14–5.80)
RDW: 12.3 % (ref 11.6–15.4)
WBC: 5.6 10*3/uL (ref 3.4–10.8)

## 2023-06-12 LAB — BASIC METABOLIC PANEL
BUN/Creatinine Ratio: 13 (ref 10–24)
BUN: 16 mg/dL (ref 8–27)
CO2: 22 mmol/L (ref 20–29)
Calcium: 10.4 mg/dL — ABNORMAL HIGH (ref 8.6–10.2)
Chloride: 104 mmol/L (ref 96–106)
Creatinine, Ser: 1.22 mg/dL (ref 0.76–1.27)
Glucose: 85 mg/dL (ref 70–99)
Potassium: 4.2 mmol/L (ref 3.5–5.2)
Sodium: 142 mmol/L (ref 134–144)
eGFR: 62 mL/min/{1.73_m2} (ref >59)

## 2023-06-29 ENCOUNTER — Other Ambulatory Visit: Payer: Self-pay | Admitting: Internal Medicine

## 2023-06-29 DIAGNOSIS — E1151 Type 2 diabetes mellitus with diabetic peripheral angiopathy without gangrene: Secondary | ICD-10-CM

## 2023-07-03 ENCOUNTER — Encounter: Payer: Self-pay | Admitting: Nurse Practitioner

## 2023-07-03 ENCOUNTER — Ambulatory Visit: Payer: PPO | Attending: Nurse Practitioner | Admitting: Nurse Practitioner

## 2023-07-03 VITALS — BP 120/76 | HR 64 | Ht 72.0 in | Wt 209.4 lb

## 2023-07-03 DIAGNOSIS — N1831 Chronic kidney disease, stage 3a: Secondary | ICD-10-CM

## 2023-07-03 DIAGNOSIS — I2510 Atherosclerotic heart disease of native coronary artery without angina pectoris: Secondary | ICD-10-CM

## 2023-07-03 DIAGNOSIS — E785 Hyperlipidemia, unspecified: Secondary | ICD-10-CM | POA: Diagnosis not present

## 2023-07-03 DIAGNOSIS — E119 Type 2 diabetes mellitus without complications: Secondary | ICD-10-CM

## 2023-07-03 DIAGNOSIS — I10 Essential (primary) hypertension: Secondary | ICD-10-CM

## 2023-07-03 DIAGNOSIS — I739 Peripheral vascular disease, unspecified: Secondary | ICD-10-CM | POA: Diagnosis not present

## 2023-07-03 NOTE — Patient Instructions (Signed)
Medication Instructions:  Your physician recommends that you continue on your current medications as directed. Please refer to the Current Medication list given to you today.  *If you need a refill on your cardiac medications before your next appointment, please call your pharmacy*   Lab Work: NONE ordered at this time of appointment    Testing/Procedures: Please schedule lower extremity dopplers. Previously ordered by Dr. Allyson Sabal   Follow-Up: At [facility], you and your health needs are our priority.  As part of our continuing mission to provide you with exceptional heart care, we have created designated Provider Care Teams.  These Care Teams include your primary Cardiologist (physician) and Advanced Practice Providers (APPs -  Physician Assistants and Nurse Practitioners) who all work together to provide you with the care you need, when you need it.  We recommend signing up for the patient portal called "MyChart".  Sign up information is provided on this After Visit Summary.  MyChart is used to connect with patients for Virtual Visits (Telemedicine).  Patients are able to view lab/test results, encounter notes, upcoming appointments, etc.  Non-urgent messages can be sent to your provider as well.   To learn more about what you can do with MyChart, go to [email or web address].    Your next appointment:   6 month(s)  Provider:   Nanetta Batty, MD     Other Instructions

## 2023-07-03 NOTE — Progress Notes (Signed)
Office Visit    Patient Name: [name] Date of Encounter: 07/03/2023  Primary Care Provider:  Anabel Halon, MD Primary Cardiologist:  Nanetta Batty, MD  Chief Complaint    77 year old male with a history of CAD s/p BMS-OM1 in 2012, unsuccessful PCI-OM1 (ISR) in 2019, PVD s/p left SFA stenting, hypertension, hyperlipidemia, CKD stage IIIa, and type 2 diabetes who presents for follow-up related to CAD.  Past Medical History    Past Medical History:  Diagnosis Date   CHF (congestive heart failure) (HCC)    Coronary artery disease 08/28/2010   s/p multiple caths 2012, BMS PCI OM1 on August 28, 2010-during NSTEMI; January 2019 non-STEMI- occlusion of OM stent (very late stent thrombosis), initial wire crossed with PTCA, but unable to rewire, PCI aborted--> plan medical therapy, normal EF   Diabetes mellitus    GERD (gastroesophageal reflux disease)    Hyperlipidemia    Hypertension    Non-STEMI (non-ST elevated myocardial infarction) [facility]) January 2012 and 2019   a) Jan 2012: 99% OM1 - BMS PCI; b) Jan 2019: Very late stent thrombosis/100% OM1 -after initially causing him for repeat PTCA restoring flow, unable to recross to place stent. - >  Medical therapy.   NSTEMI (non-ST elevated myocardial infarction) (HCC)    Admitted 08/29/17 with NSTEMI-Troponin peak 8.8, normal LVF   PVD (peripheral vascular disease) (HCC)    left SFA PTA & stenting in 02/2005 (Dr. Erlene Quan)   Vitamin D deficiency disease 05/04/2019   Past Surgical History:  Procedure Laterality Date   BIOPSY  08/02/2019   Procedure: BIOPSY;  Surgeon: Corbin Ade, MD;  [facility]: [facility];  Service: Endoscopy;;  gastric    CARDIAC CATHETERIZATION  12/23/2004   normal L main, normal LAD, normal L Cfx, RCA with 20% hypodense lesion in first end of vessel (Dr. Erlene Quan)   CARDIAC CATHETERIZATION  08/26/2007   no significant CAD by cath, EF 50% (Dr. Evlyn Courier)   COLONOSCOPY  12/2009   Dr. Louie Casa    COLONOSCOPY N/A 01/30/2017   pancolonic diverticulosis, non-bleeding internal hemorrhoids.   COLONOSCOPY WITH PROPOFOL N/A 09/08/2019   Procedure: COLONOSCOPY WITH PROPOFOL;  Surgeon: Corbin Ade, MD;  [facility]: [facility];  Service: Endoscopy;  Laterality: N/A;  11:15am   CORONARY BALLOON ANGIOPLASTY N/A 08/30/2017   Procedure: CORONARY BALLOON ANGIOPLASTY;  Surgeon: Runell Gess, MD;  Location: [facility];  Service: Cardiovascular;  100% CTO very late stent thrombosis OM1 -> initially crossed with PTCA, but then unable to recross after losing my positioning.  PTCA ABORTED.  UNSUCCESSFUL ATTEMPT   CORONARY BALLOON ANGIOPLASTY  02/10/2011   95% prox in-stent restenosis within OM stent - opened with cutting balloon (Dr. Bishop Limbo)   CORONARY STENT INTERVENTION  07/17/2011   in-stent restenosis - re-stented with Promus 2.25x38mm DES (Dr. Ranae Palms)   CORONARY STENT INTERVENTION  08/28/2010   NSTEMI: OM1 99% BMS PCI 2.0x32mm MiniVision BMS (Dr. 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 Erlene Quan)   LEFT HEART  CATH AND CORONARY ANGIOGRAPHY N/A 08/30/2017   Procedure: LEFT HEART CATH AND CORONARY ANGIOGRAPHY;  Surgeon: Runell Gess, MD;  [facility];  Service: Cardiovascular;  100% very late stent thrombosis of Overlapped BMS-DES OM1 -> attempted PTCA   LEFT HEART CATH AND CORONARY ANGIOGRAPHY  08/28/2010   NSTEMI: OM1 99% BMS PCI  (Dr. Erlene Quan)   LEFT HEART CATH AND  CORONARY ANGIOGRAPHY  09/11/2010   patent stent (Dr. Ranae Palms)   LEFT HEART CATH AND CORONARY ANGIOGRAPHY  02/10/2011   95% prox in-stent restenosis within OM stent  (Dr. Bishop Limbo)   LEFT HEART CATHETERIZATION WITH CORONARY ANGIOGRAM N/A 07/17/2011   Procedure: LEFT HEART CATHETERIZATION WITH CORONARY ANGIOGRAM;  Surgeon: Marykay Lex, MD;  Location: [facility] CATH LAB;  Service: Cardiovascular;  Laterality: N/A;  Right radial approach;  90% ISR of BMS (5 months post PTCA for ISR) --> DES PCI   left knee arthroscopy  05/2016   NM MYOCAR PERF WALL MOTION  09/08/2013   abnormal lexiscan - low to intermediate risk;    POLYPECTOMY  09/08/2019   Procedure: POLYPECTOMY;  Surgeon: Corbin Ade, MD;  [facility];  Service: Endoscopy;;   TOOTH EXTRACTION Right 06/19/2020   TRANSTHORACIC ECHOCARDIOGRAM  09/01/2017   Normal LV size and function.  EF 66 5%.  Normal wall motion.  GR 1 DD.  Mild aortic sclerosis.  Aortic root mildly dilated at 40 mm.    Allergies  Allergies  Allergen Reactions   Crestor [Rosuvastatin]     myalgia   Jardiance [Empagliflozin]     Penile swelling and rash   Propoxyphene N-Acetaminophen Nausea Only   Statins Other (See Comments)    Severe muscle cramping/aching/pain/ elevated CK   Tape     Blisters   Zetia [Ezetimibe]     Muscle cramping   Zocor [Simvastatin]     myalgia   Flomax [Tamsulosin Hcl] Rash   Flomax [Tamsulosin] Rash   Levaquin [Levofloxacin Hemihydrate] Rash   Levofloxacin Rash   Penicillin G Diarrhea and Rash   Penicillins Rash    Broke out in rash 6 years ago, pt recently took penicillin (09/2016) and had no reaction immediate rash, facial/tongue/throat swelling, SOB or lightheadedness with hypotension     Labs/Other Studies Reviewed    The following studies were reviewed today:  Cardiac Studies & Procedures   CARDIAC CATHETERIZATION  CARDIAC CATHETERIZATION 08/30/2017  Narrative Images from the original result were not  included.   Ost 1st Mrg to 1st Mrg lesion is 100% stenosed.  Post intervention, there is a 100% residual stenosis.  The left ventricular systolic function is normal.  LV end diastolic pressure is normal.  The left ventricular ejection fraction is 50-55% by visual estimate.  ABDULLA SARRACINO is a 77 y.o. male   [id number] LOCATION:  FACILITY: [facility] PHYSICIAN: Nanetta Batty, M.D. [birth year]   DATE OF PROCEDURE:  08/30/2017  DATE OF DISCHARGE:     CARDIAC CATHETERIZATION / POBA LXC-OM ISR    History obtained from chart review.77 y.o. male  patient of Dr. Clayborne Dana with CAD s/p muliple PCI to OM1, PVD s/p PCI (SFA, PTA), CKD II, HTN, DM2, GERD, who was [facility] with NSTEMI. Prior history of PCI to OM1 January 2012 with subsequent restenosis for which he has had repeat balloon angioplasty and subsequent PCI July 2012.  Last cath 2012. Low risk nuc 06/2017 with  small inferior defect that was felt to be due  to tissue attenuation.  he developed chest pain on Friday and was admitted to the hospital on Saturday. He was transferred from [facility]. His troponins increased to 9. He had no EKG changes. Because of mild ongoing chest pain with rising troponin Dr. Gala Romney  felt that he should undergo cardiac catheterization today.     PROCEDURE DESCRIPTION:  The patient was brought to the second floor [facility] cath lab in the postabsorptive state. He was not premedicated . His right wristwas prepped and shaved in usual sterile fashion. Xylocaine 1% was used for local anesthesia. A 6 French sheath was inserted into the right radial artery using standard Seldinger technique. The patient received 5000 units  of heparin  intravenously.  A 5 [place] TIG catheter and pigtail catheters were used for selective coronary angiography and left ventriculography respectively. Isovue dye was used for the entirety of the case. Retrograde aorta, left ventricular and pullback  pressures were recorded. Radial cocktail was administered via the SideArm sheath.  The patient received Angiomax bolus followed by infusion with a therapeutic ACT. He received Brilenta 180 mg by mouth. Using XB 3.5; catheter according to a 4.0; catheter, a 0.1 for pro-water guidewire and a 2 mm x 12 mm balloon I was able to cross the occluded first obtuse marginal branch stent with some difficulty and angioplasty within the stent briefly establishing antegrade flow. When I re-angiogram after pulling the wire back vessel occluded again and unfortunately I was unable to rewire the vessel despite multiple different wires.  Impression Unsuccessful circumflex obtuse marginal branch PCI of an occluded previously placed marginal branch stent of the right foot in January 2012. I was unable to rewire the vessel after initial angioplasty. He had normal LV function. At this point, I recommend continue medical therapy. He was pain-free at the end of the procedure. The sheath was removed and a TR band was placed on the right wrist which is patent hemostasis. Angiomax was discontinued.  Nanetta Batty. MD, [facility] 08/30/2017 12:02 PM  Findings Coronary Findings Diagnostic  Dominance: Co-dominant  Left Circumflex  First Obtuse Marginal Branch Ost 1st Mrg to 1st Mrg lesion is 100% stenosed. The lesion was previously treated.  Intervention  Ost 1st Mrg to 1st Mrg lesion Angioplasty Post-Intervention Lesion Assessment There is a 100% residual stenosis post intervention.   STRESS TESTS  NM MYOCAR MULTI W/SPECT W 05/27/2023  Narrative   The study is normal. There are no perfusion defects consistent with prior infact or current ischemia.  The study is low risk.   No ST deviation was noted.   There is a large moderate intensity inferior wall defect that is most intense in the resting images with normal wall motion. There is adjacent gut radiotracer uptake. Findings consistent with diaphragmatic attenuation  and gut artifact.   Left ventricular function is normal. Nuclear stress EF: 62%. The left ventricular ejection fraction is normal (55-65%). End diastolic cavity size is normal.   ECHOCARDIOGRAM  ECHOCARDIOGRAM COMPLETE 09/01/2017  Narrative *[place]* *[facility]* [address] [place], [place] [address] [phone]  ------------------------------------------------------------------- Transthoracic Echocardiography  Patient:    Akai, Weatherby MR #:       [id number] Study Date: 09/01/2017 Gender:     M Age:        71 Height:     182.9 cm Weight:     98.4 kg BSA:        2.26 m^2 Pt. Status: Room:  [facility]  ORDERING     Nanetta Batty, MD REFERRING    Nanetta Batty, MD PERFORMING   [facility], Inpatient ATTENDING    [place], [facility] SONOGRAPHER  Celene Skeen, RDCS ADMITTING    Ernest Mallick  cc:  ------------------------------------------------------------------- LV EF: 60% -   65%  ------------------------------------------------------------------- History:   PMH:  CAD native vessel 414.01.  Coronary artery disease.  Congestive heart failure.  Risk factors:  Hypertension. Diabetes mellitus.  ------------------------------------------------------------------- Study Conclusions  - Left ventricle: The cavity size was normal. There was mild concentric hypertrophy. Systolic function was normal. The estimated ejection fraction was in the range of 60% to 65%. Wall motion was normal; there were no regional wall motion abnormalities. There was an increased relative contribution of atrial contraction to ventricular filling. Doppler parameters are consistent with abnormal left ventricular relaxation (grade 1 diastolic dysfunction). - Aortic valve: Mildly calcified annulus. Trileaflet; mildly thickened, mildly calcified leaflets. - Aorta: Aortic root dimension: 40 mm (ED). - Aortic root: The aortic root was mildly dilated. - Pulmonary arteries: Systolic  pressure could not be accurately estimated.  ------------------------------------------------------------------- Study data:  Comparison was made to the study of 09/08/2013.  Study status:  Routine.  Procedure:  The patient reported no pain pre or post test. Transthoracic echocardiography. Image quality was adequate.  Study completion:  There were no complications. Transthoracic echocardiography.  M-mode, complete 2D, spectral Doppler, and color Doppler.  Birthdate:  Patient birthdate: 1946/04/16.  Age:  Patient is 77 yr old.  Sex:  Gender: male. BMI: 29.4 kg/m^2.  Blood pressure:     118/77  Patient status: Inpatient.  Study date:  Study date: 09/01/2017. Study time: 08:16 AM.  Location:  ICU/CCU  -------------------------------------------------------------------  ------------------------------------------------------------------- Left ventricle:  The cavity size was normal. There was mild concentric hypertrophy. Systolic function was normal. The estimated ejection fraction was in the range of 60% to 65%. Wall motion was normal; there were no regional wall motion abnormalities. There was an increased relative contribution of atrial contraction to ventricular filling. Doppler parameters are consistent with abnormal left ventricular relaxation (grade 1 diastolic dysfunction).  ------------------------------------------------------------------- Aortic valve:   Mildly calcified annulus. Trileaflet; mildly thickened, mildly calcified leaflets. Mobility was not restricted. Doppler:  Transvalvular velocity was within the normal range. There was no stenosis. There was no regurgitation.  ------------------------------------------------------------------- Aorta:  Aortic root: The aortic root was mildly dilated.  ------------------------------------------------------------------- Mitral valve:   Structurally normal valve.   Mobility was not restricted.  Doppler:  Transvalvular velocity  was within the normal range. There was no evidence for stenosis. There was no regurgitation.  ------------------------------------------------------------------- Left atrium:  The atrium was normal in size.  ------------------------------------------------------------------- Right ventricle:  The cavity size was normal. Wall thickness was normal. Systolic function was normal.  ------------------------------------------------------------------- Pulmonic valve:    Structurally normal valve.   Cusp separation was normal.  Doppler:  Transvalvular velocity was within the normal range. There was no evidence for stenosis. There was no regurgitation.  ------------------------------------------------------------------- Tricuspid valve:   Structurally normal valve.    Doppler: Transvalvular velocity was within the normal range. There was no regurgitation.  ------------------------------------------------------------------- Pulmonary artery:   The main pulmonary artery was normal-sized. Systolic pressure could not be accurately estimated.  ------------------------------------------------------------------- Right atrium:  The atrium was normal in size.  ------------------------------------------------------------------- Pericardium:  There was no pericardial effusion.  ------------------------------------------------------------------- Systemic veins: Inferior vena cava: The vessel was normal in size.  ------------------------------------------------------------------- Measurements  Left ventricle  Value        Reference LV ID, ED, PLAX chordal         (L)       42.8  mm     43 - 52 LV ID, ES, PLAX chordal                   27.3  mm     23 - 38 LV fx shortening, PLAX chordal            36    %      >=29 LV PW thickness, ED                       12.9  mm     ---------- IVS/LV PW ratio, ED                       0.98         <=1.3 Stroke volume, 2D                          49    ml     ---------- Stroke volume/bsa, 2D                     22    ml/m^2 ---------- LV e&', lateral                            7.51  cm/s   ---------- LV e&', medial                             9.03  cm/s   ---------- LV e&', average                            8.27  cm/s   ----------  Ventricular septum                        Value        Reference IVS thickness, ED                         12.7  mm     ----------  LVOT                                      Value        Reference LVOT ID, S                                20    mm     ---------- LVOT area                                 3.14  cm^2   ---------- LVOT peak velocity, S                     89.4  cm/s   ---------- LVOT mean velocity, S  58.3  cm/s   ---------- LVOT VTI, S                               15.6  cm     ----------  Aorta                                     Value        Reference Aortic root ID, ED                        40    mm     ----------  Left atrium                               Value        Reference LA ID, A-P, ES                            30    mm     ---------- LA ID/bsa, A-P                            1.33  cm/m^2 <=2.2 LA volume, S                              36.2  ml     ---------- LA volume/bsa, S                          16    ml/m^2 ---------- LA volume, ES, 1-p A4C                    32.4  ml     ---------- LA volume/bsa, ES, 1-p A4C                14.4  ml/m^2 ---------- LA volume, ES, 1-p A2C                    32.8  ml     ---------- LA volume/bsa, ES, 1-p A2C                14.5  ml/m^2 ----------  Right atrium                              Value        Reference RA ID, S-I, ES, A4C                       44.9  mm     34 - 49 RA area, ES, A4C                          12.2  cm^2   8.3 - 19.5 RA volume, ES, A/L                        25.8  ml     ---------- RA volume/bsa, ES, A/L  11.4  ml/m^2 ----------  Right ventricle                            Value        Reference RV ID, minor axis, ED, A4C base           33.2  mm     ---------- RV ID, minor axis, ED, A4C mid            27.4  mm     ---------- RV ID, major axis, ED, A4C                68.1  mm     55 - 91 TAPSE                                     20.4  mm     ---------- RV s&', lateral, S                         10.9  cm/s   ----------  Legend: (L)  and  (H)  mark values outside specified reference range.  ------------------------------------------------------------------- Prepared and Electronically Authenticated by  Armanda Magic, MD 2019-01-22T10:23:54    MONITORS  CARDIAC EVENT MONITOR 07/27/2019  Narrative 1: Sinus rhythm/sinus bradycardia/sinus tachycardia 2: No arrhythmias noted          Recent Labs: 01/14/2023: TSH 0.670 05/04/2023: ALT 17 06/11/2023: BUN 16; Creatinine, Ser 1.22; Hemoglobin 13.9; Platelets 166; Potassium 4.2; Sodium 142  Recent Lipid Panel    Component Value Date/Time   CHOL 159 08/12/2022 0938   TRIG 287 (H) 08/12/2022 0938   HDL 36 (L) 08/12/2022 0938   CHOLHDL 4.4 08/12/2022 0938   CHOLHDL 3.4 12/08/2019 1041   VLDL 27 08/30/2017 0315   LDLCALC 76 08/12/2022 0938   LDLCALC 69 12/08/2019 1041   LDLDIRECT 66 04/13/2013 0732    History of Present Illness    77 year old male with the above past medical history including CAD s/p BMS-OM1 in 2012, unsuccessful PCI-OM1 (ISR) in 2019, PVD s/p left SFA stenting, hypertension, hyperlipidemia, CKD stage IIIa, and type 2 diabetes.   He has a history of CAD s/p DES-OM1 in 2012. Low risk nuc 06/2017 with small inferior defect that was felt to be due to tissue attenuation. Cardiac catheterization in 2019 previously placed marginal branch stent, s/p unsuccessful PCI. Echocardiogram in 2019 of 60 to 65%, no RWMA, G1 DD, mild dilation of the aortic root measuring 40 mm. Lexiscan Myoview in 2021 showed decreased perfusion in 2 areas, fixed defects consistent with prior infarct, not consistent  with ischemia. He has a history of PVD s/p L SFA, PTA/stenting in 2006. Lower extremity Dopplers in January 2024 showed normal ABIs bilaterally, with high-frequency signal in the distal left common femoral artery.  He was last seen in the office on 10/27/2022 and was stable from a cardiac standpoint.  He noted occasional atypical chest pain, improved with Protonix, more typical of reflux. He was hospitalized in October 2024 in the setting of atypical chest pain.  Troponin was negative.  Cardiology was consulted.  He underwent Lexiscan Myoview which was low risk.  Ongoing medical therapy was advised.    He presents today for follow-up.  Since his last visit and since his recent hospitalization done well from a cardiac standpoint.  He denies any symptoms  concerning for angina.  Denies claudication.  Overall, he reports feeling well.  Home Medications    Current Outpatient Medications  Medication Sig Dispense Refill   acetaminophen (TYLENOL) 500 MG tablet Take 500 mg by mouth every 6 (six) hours as needed for moderate pain (pain score 4-6).     Alirocumab (PRALUENT) 75 MG/ML SOAJ Inject 1 Dose into the skin every 14 (fourteen) days.     aspirin 81 MG chewable tablet Chew 1 tablet (81 mg total) by mouth daily.     Cholecalciferol (VITAMIN D3) 50 MCG (2000 UT) TABS Take 3 tablets by mouth daily.     clopidogrel (PLAVIX) 75 MG tablet TAKE ONE TABLET BY MOUTH ONCE DAILY. 30 tablet 9   cyclobenzaprine (FLEXERIL) 10 MG tablet Take 1 tablet by mouth at bedtime.     diclofenac Sodium (VOLTAREN) 1 % GEL Apply topically as needed.     docusate sodium (COLACE) 100 MG capsule Take 100 mg by mouth every other day.     glipiZIDE-metformin (METAGLIP) 5-500 MG tablet TAKE (1) TABLET BY MOUTH ONCE DAILY AT 12 NOON. 90 tablet 0   ibuprofen (ADVIL) 200 MG tablet Take 200 mg by mouth every 6 (six) hours as needed for mild pain (pain score 1-3).     isosorbide mononitrate (IMDUR) 60 MG 24 hr tablet Take 1 tablet (60 mg  total) by mouth daily. 90 tablet 3   lisinopril (PRINIVIL,ZESTRIL) 5 MG tablet Take 5 mg by mouth daily.     loratadine (CLARITIN) 10 MG tablet Take 1 tablet by mouth daily.     metoprolol succinate (TOPROL-XL) 25 MG 24 hr tablet TAKE 2 TABLETS BY MOUTH ONCE DAILY. 180 tablet 1   nitroGLYCERIN (NITROSTAT) 0.4 MG SL tablet DISSOLVE 1 TABLET UNDER TONGUE EVERY 5 MINUTES UP TO 15 MIN FOR CHEST PAIN. IF NO RELIEF CALL 911. 25 tablet 4   omeprazole (PRILOSEC) 20 MG capsule Take 20 mg by mouth as needed (acid reflux).     ONETOUCH ULTRA test strip USE AS DIRECTED UP TO 3 TIMES DAILY IF NEEDED. 100 strip 3   pantoprazole (PROTONIX) 40 MG tablet Take 40 mg by mouth daily.     ranolazine (RANEXA) 500 MG 12 hr tablet Take 500 mg by mouth 2 (two) times daily.     Semaglutide,0.25 or 0.5MG /DOS, (OZEMPIC, 0.25 OR 0.5 MG/DOSE,) 2 MG/1.5ML SOPN Inject 0.25 mg into the skin once a week.     sucralfate (CARAFATE) 1 GM/10ML suspension Take 10 mLs (1 g total) by mouth 4 (four) times daily. (Patient taking differently: Take 1 g by mouth as needed (acid reflux).) 420 mL 1   Semaglutide (RYBELSUS) 7 MG TABS Take 1 tablet (7 mg total) by mouth daily. (Patient not taking: Reported on 07/03/2023) 90 tablet 3   No current facility-administered medications for this visit.     Review of Systems    He denies chest pain, palpitations, dyspnea, pnd, orthopnea, n, v, dizziness, syncope, edema, weight gain, or early satiety. All other systems reviewed and are otherwise negative except as noted above.   Physical Exam    VS:  BP 120/76 (BP Location: Left Arm, Patient Position: Sitting, Cuff Size: Large)   Pulse 64   Ht 6' (1.829 m)   Wt 209 lb 6.4 oz (95 kg)   SpO2 97%   BMI 28.40 kg/m   GEN: Well nourished, well developed, in no acute distress. HEENT: normal. Neck: Supple, no JVD, carotid bruits, or masses. Cardiac: RRR, no murmurs,  rubs, or gallops. No clubbing, cyanosis, edema.  Radials/DP/PT 2+ and equal  bilaterally.  Respiratory:  Respirations regular and unlabored, clear to auscultation bilaterally. GI: Soft, nontender, nondistended, BS + x 4. MS: no deformity or atrophy. Skin: warm and dry, no rash. Neuro:  Strength and sensation are intact. Psych: Normal affect.  Accessory Clinical Findings    ECG personally reviewed by me today - EKG Interpretation Date/Time:  Friday July 03 2023 13:26:37 EST Ventricular Rate:  64 PR Interval:  230 QRS Duration:  78 QT Interval:  386 QTC Calculation: 398 R Axis:   5  Text Interpretation: Sinus rhythm with 1st degree A-V block When compared with ECG of 26-May-2023 17:26, Premature atrial complexes are no longer Present Confirmed by Bernadene Person ([id number]) on 07/03/2023 1:29:36 PM  - no acute changes.   Lab Results  Component Value Date   WBC 5.6 06/11/2023   HGB 13.9 06/11/2023   HCT 42.0 06/11/2023   MCV 101 (H) 06/11/2023   PLT 166 06/11/2023   Lab Results  Component Value Date   CREATININE 1.22 06/11/2023   BUN 16 06/11/2023   NA 142 06/11/2023   K 4.2 06/11/2023   CL 104 06/11/2023   CO2 22 06/11/2023   Lab Results  Component Value Date   ALT 17 05/04/2023   AST 18 05/04/2023   ALKPHOS 32 (L) 05/04/2023   BILITOT 1.7 (H) 05/04/2023   Lab Results  Component Value Date   CHOL 159 08/12/2022   HDL 36 (L) 08/12/2022   LDLCALC 76 08/12/2022   LDLDIRECT 66 04/13/2013   TRIG 287 (H) 08/12/2022   CHOLHDL 4.4 08/12/2022    Lab Results  Component Value Date   HGBA1C 5.5 05/14/2023    Assessment & Plan   1. CAD/atypical chest pain: S/p BMS-OM1 in 2012, unsuccessful PCI-OM1 (ISR) in 2019.  Recent hospitalization in the setting of atypical chest pain. Lexiscan Myoview which was low risk.  Stable with no anginal symptoms.  Recent ischemic evaluation reassuring.  Continue aspirin, Plavix, lisinopril, metoprolol, Ranexa, Imdur, and Praluent.  2. Hypertension: BP well controlled. Continue current antihypertensive regimen.   3.  Hyperlipidemia: LDL was 76 in 08/2022.  Continue Praluent.  4. PAD: S/p left SFA stenting. Lower extremity Dopplers in January 2024 showed normal ABIs bilaterally, with high-frequency signal in the distal left common femoral artery.  Denies claudication.  Repeat lower extremity Dopplers planned for January 2025.  Continue aspirin, Plavix, pilot.  5. CKD stage IIIa: Creatinine was stable at 1.22 in 05/2023.   6. Type 2 diabetes: A1c was 5.5 in 05/2023. Monitored and managed per PCP.   7. Disposition: Follow-up in 6 months, sooner if needed.      Joylene Grapes, NP 07/03/2023, 1:51 PM

## 2023-07-07 ENCOUNTER — Encounter: Payer: Self-pay | Admitting: Gastroenterology

## 2023-07-07 ENCOUNTER — Ambulatory Visit: Payer: PPO | Admitting: Gastroenterology

## 2023-07-07 VITALS — BP 111/64 | HR 67 | Temp 97.8°F | Ht 72.0 in | Wt 206.3 lb

## 2023-07-07 DIAGNOSIS — K219 Gastro-esophageal reflux disease without esophagitis: Secondary | ICD-10-CM

## 2023-07-07 DIAGNOSIS — K5900 Constipation, unspecified: Secondary | ICD-10-CM

## 2023-07-07 NOTE — Progress Notes (Signed)
Gastroenterology Office Note     Primary Care Physician:  Anabel Halon, MD  Primary Gastroenterologist: Dr. Jena Gauss    Chief Complaint   Chief Complaint  Patient presents with   Follow-up    Patient here today for a follow up on his Genella Rife and Constipation. Patient says Genella Rife is controlled on Pantoprazole 40 mg once per day and Carafate 1g up to Qid. Patient says he has been having issues with constipation, he is currently taking a stool softener,but this has not helped much. Patient mentions when straining he gets a pain in his left leg, he reports having a stent in the same location. As far as the IDA. Patient denies any sight of dark or blood stools, denies any shortness of breath, dizzines     History of Present Illness   [name] is a 77 y.o. male presenting today with a history of  GERD, constipation, prior anemia with IDA component in the past, returning for routine follow-up. Last seen in Nov 2023.   Stool softener once daily. Straining noted. BM every 3 days or so. No supplemental fiber. Does not remember taking Linzess in the past. Wants to stick with OTC options. He also would like to return in 1 year.  Pantoprazole 40 mg once daily. Intermittent carafate.    Colonoscopy in 2021: Diverticulosis in the sigmoid and distal ascending colon, single 5 mm polyp (tubular adenoma) removed, cecal AVM ablated.  EGD in 2020 with erythematous mucosa in the stomach consistent with PPI effect.      Past Medical History:  Diagnosis Date   CHF (congestive heart failure) (HCC)    Coronary artery disease 08/28/2010   s/p multiple caths 2012, BMS PCI OM1 on August 28, 2010-during NSTEMI; January 2019 non-STEMI- occlusion of OM stent (very late stent thrombosis), initial wire crossed with PTCA, but unable to rewire, PCI aborted--> plan medical therapy, normal EF   Diabetes mellitus    GERD (gastroesophageal reflux disease)    Hyperlipidemia    Hypertension    Non-STEMI (non-ST  elevated myocardial infarction) [facility]) January 2012 and 2019   a) Jan 2012: 99% OM1 - BMS PCI; b) Jan 2019: Very late stent thrombosis/100% OM1 -after initially causing him for repeat PTCA restoring flow, unable to recross to place stent. - >  Medical therapy.   NSTEMI (non-ST elevated myocardial infarction) (HCC)    Admitted 08/29/17 with NSTEMI-Troponin peak 8.8, normal LVF   PVD (peripheral vascular disease) (HCC)    left SFA PTA & stenting in 02/2005 (Dr. Erlene Quan)   Vitamin D deficiency disease 05/04/2019    Past Surgical History:  Procedure Laterality Date   BIOPSY  08/02/2019   Procedure: BIOPSY;  Surgeon: Corbin Ade, MD;  [facility]: [facility];  Service: Endoscopy;;  gastric    CARDIAC CATHETERIZATION  12/23/2004   normal L main, normal LAD, normal L Cfx, RCA with 20% hypodense lesion in first end of vessel (Dr. Erlene Quan)   CARDIAC CATHETERIZATION  08/26/2007   no significant CAD by cath, EF 50% (Dr. Evlyn Courier)   COLONOSCOPY  12/2009   Dr. Louie Casa   COLONOSCOPY N/A 01/30/2017   pancolonic diverticulosis, non-bleeding internal hemorrhoids.   COLONOSCOPY WITH PROPOFOL N/A 09/08/2019   Procedure: COLONOSCOPY WITH PROPOFOL;  Surgeon: Corbin Ade, MD;  [facility]: [facility];  Service: Endoscopy;  Laterality: N/A;  11:15am   CORONARY BALLOON ANGIOPLASTY N/A 08/30/2017   Procedure: CORONARY BALLOON ANGIOPLASTY;  Surgeon: Runell Gess,  MD;  Location: [facility];  Service: Cardiovascular;  100% CTO very late stent thrombosis OM1 -> initially crossed with PTCA, but then unable to recross after losing my positioning.  PTCA ABORTED.  UNSUCCESSFUL ATTEMPT   CORONARY BALLOON ANGIOPLASTY  02/10/2011   95% prox in-stent restenosis within OM stent - opened with cutting balloon (Dr. Bishop Limbo)   CORONARY STENT INTERVENTION  07/17/2011   in-stent restenosis - re-stented with Promus 2.25x73mm DES (Dr. Ranae Palms)   CORONARY STENT INTERVENTION  08/28/2010   NSTEMI: OM1 99% BMS  PCI 2.0x110mm MiniVision BMS (Dr. 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 Erlene Quan)   LEFT HEART CATH AND CORONARY ANGIOGRAPHY N/A 08/30/2017   Procedure: LEFT HEART CATH AND CORONARY ANGIOGRAPHY;  Surgeon: Runell Gess, MD;  Location: [facility];  Service: Cardiovascular;  100% very late stent thrombosis of Overlapped BMS-DES OM1 -> attempted PTCA   LEFT HEART CATH AND CORONARY ANGIOGRAPHY  08/28/2010   NSTEMI: OM1 99% BMS PCI  (Dr. Erlene Quan)   LEFT HEART CATH AND CORONARY ANGIOGRAPHY  09/11/2010   patent stent (Dr. Ranae Palms)   LEFT HEART CATH AND CORONARY ANGIOGRAPHY  02/10/2011   95% prox in-stent restenosis within OM stent  (Dr. Bishop Limbo)   LEFT HEART CATHETERIZATION WITH CORONARY ANGIOGRAM N/A 07/17/2011   Procedure: LEFT HEART CATHETERIZATION WITH CORONARY ANGIOGRAM;  Surgeon: Marykay Lex, MD;  Location: [facility] CATH [facility];  Service: Cardiovascular;  Laterality: N/A;  Right radial approach;  90% ISR of BMS (5 months post PTCA for ISR) --> DES PCI   left knee arthroscopy  05/2016   NM MYOCAR PERF WALL MOTION  09/08/2013   abnormal lexiscan - low to intermediate risk;    POLYPECTOMY  09/08/2019   Procedure: POLYPECTOMY;  Surgeon: Corbin Ade, MD;  Location: [facility];  Service: Endoscopy;;   TOOTH EXTRACTION Right 06/19/2020   TRANSTHORACIC ECHOCARDIOGRAM  09/01/2017   Normal LV size and function.  EF 66 5%.  Normal wall motion.  GR 1 DD.  Mild aortic sclerosis.  Aortic root  mildly dilated at 40 mm.    Current Outpatient Medications  Medication Sig Dispense Refill   acetaminophen (TYLENOL) 500 MG tablet Take 500 mg by mouth every 6 (six) hours as needed for moderate pain (pain score 4-6).     Alirocumab (PRALUENT) 75 MG/ML SOAJ Inject 1 Dose into the skin every 14 (fourteen) days.     aspirin 81 MG chewable tablet Chew 1 tablet (81 mg total) by mouth daily.     Cholecalciferol (VITAMIN D3) 50 MCG (2000 UT) TABS Take 3 tablets by mouth daily.     clopidogrel (PLAVIX) 75 MG tablet TAKE ONE TABLET BY MOUTH ONCE DAILY. 30 tablet 9   cyclobenzaprine (FLEXERIL) 10 MG tablet Take 1 tablet by mouth at bedtime.     diclofenac Sodium (VOLTAREN) 1 % GEL Apply topically as needed.     glipiZIDE-metformin (METAGLIP) 5-500 MG tablet TAKE (1) TABLET BY MOUTH ONCE DAILY AT 12 NOON. 90 tablet 0  ibuprofen (ADVIL) 200 MG tablet Take 200 mg by mouth every 6 (six) hours as needed for mild pain (pain score 1-3).     isosorbide mononitrate (IMDUR) 60 MG 24 hr tablet Take 1 tablet (60 mg total) by mouth daily. 90 tablet 3   lisinopril (PRINIVIL,ZESTRIL) 5 MG tablet Take 5 mg by mouth daily.     loratadine (CLARITIN) 10 MG tablet Take 1 tablet by mouth daily.     metoprolol succinate (TOPROL-XL) 25 MG 24 hr tablet TAKE 2 TABLETS BY MOUTH ONCE DAILY. 180 tablet 1   nitroGLYCERIN (NITROSTAT) 0.4 MG SL tablet DISSOLVE 1 TABLET UNDER TONGUE EVERY 5 MINUTES UP TO 15 MIN FOR CHEST PAIN. IF NO RELIEF CALL 911. 25 tablet 4   ONETOUCH ULTRA test strip USE AS DIRECTED UP TO 3 TIMES DAILY IF NEEDED. 100 strip 3   pantoprazole (PROTONIX) 40 MG tablet Take 40 mg by mouth daily.     ranolazine (RANEXA) 500 MG 12 hr tablet Take 500 mg by mouth 2 (two) times daily.     Semaglutide,0.25 or 0.5MG /DOS, (OZEMPIC, 0.25 OR 0.5 MG/DOSE,) 2 MG/1.5ML SOPN Inject 0.25 mg into the skin once a week.     sucralfate (CARAFATE) 1 GM/10ML suspension Take 10 mLs (1 g total) by mouth 4 (four) times daily. (Patient  taking differently: Take 1 g by mouth as needed (acid reflux).) 420 mL 1   docusate sodium (COLACE) 100 MG capsule Take 100 mg by mouth every other day. (Patient not taking: Reported on 07/07/2023)     No current facility-administered medications for this visit.    Allergies as of 07/07/2023 - Review Complete 07/07/2023  Allergen Reaction Noted   Crestor [rosuvastatin]  04/26/2020   Jardiance [empagliflozin]  02/19/2022   Propoxyphene n-acetaminophen Nausea Only    Statins Other (See Comments) 07/29/2011   Tape  07/29/2011   Zetia [ezetimibe]  08/30/2017   Zocor [simvastatin]  04/26/2020   Flomax [tamsulosin hcl] Rash 08/16/2014   Flomax [tamsulosin] Rash 11/09/2015   Levaquin [levofloxacin hemihydrate] Rash 07/29/2011   Levofloxacin Rash 11/09/2015   Penicillin g Diarrhea and Rash 11/09/2015   Penicillins Rash     Family History  Problem Relation Age of Onset   Arrhythmia Mother 73   Early death Son    Colon cancer Neg Hx    Liver disease Neg Hx    Inflammatory bowel disease Neg Hx    Colon polyps Neg Hx     Social History   Socioeconomic History   Marital status: Married    Spouse name: [name]   Number of children: 3   Years of education: 12   Highest education level: Some college, no degree  Occupational History   Occupation: Retired    Associate Professor: [employer]  Tobacco Use   Smoking status: Former    Current packs/day: 0.00    Average packs/day: 1 pack/day for 30.0 years (30.0 ttl pk-yrs)    Types: Cigarettes    Start date: 05/11/1969    Quit date: 05/12/1999    Years since quitting: 24.1    Passive exposure: Past   Smokeless tobacco: Never  Vaping Use   Vaping status: Never Used  Substance and Sexual Activity   Alcohol use: No   Drug use: No   Sexual activity: Yes  Other Topics Concern   Not on file  Social History Narrative   Married for 49 years.Lives with wife.Retired,ex-lab Pensions consultant.Ex-Marine,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   Social Determinants of  Health   Financial  Resource Strain: Low Risk  (09/15/2022)   Overall Financial Resource Strain (CARDIA)    Difficulty of Paying Living Expenses: Not hard at all  Food Insecurity: No Food Insecurity (05/26/2023)   Hunger Vital Sign    Worried About Running Out of Food in the Last Year: Never true    Ran Out of Food in the Last Year: Never true  Transportation Needs: No Transportation Needs (05/26/2023)   PRAPARE - Administrator, Civil Service (Medical): No    Lack of Transportation (Non-Medical): No  Physical Activity: Insufficiently Active (09/15/2022)   Exercise Vital Sign    Days of Exercise per Week: 3 days    Minutes of Exercise per Session: 30 min  Stress: No Stress Concern Present (09/15/2022)   [employer] of Occupational Health - Occupational Stress Questionnaire    Feeling of Stress : Not at all  Social Connections: Moderately Integrated (09/15/2022)   Social Connection and Isolation Panel [NHANES]    Frequency of Communication with Friends and Family: More than three times a week    Frequency of Social Gatherings with Friends and Family: More than three times a week    Attends Religious Services: More than 4 times per year    Active Member of [employer] or Organizations: No    Attends Banker Meetings: Never    Marital Status: Married  Catering manager Violence: Not At Risk (05/26/2023)   Humiliation, Afraid, Rape, and Kick questionnaire    Fear of Current or Ex-Partner: No    Emotionally Abused: No    Physically Abused: No    Sexually Abused: No     Review of Systems   Gen: Denies any fever, chills, fatigue, weight loss, lack of appetite.  CV: Denies chest pain, heart palpitations, peripheral edema, syncope.  Resp: Denies shortness of breath at rest or with exertion. Denies wheezing or cough.  GI: Denies dysphagia or odynophagia. Denies jaundice, hematemesis, fecal incontinence. GU : Denies urinary burning, urinary frequency, urinary  hesitancy MS: Denies joint pain, muscle weakness, cramps, or limitation of movement.  Derm: Denies rash, itching, dry skin Psych: Denies depression, anxiety, memory loss, and confusion Heme: Denies bruising, bleeding, and enlarged lymph nodes.   Physical Exam   BP 111/64 (BP Location: Left Arm, Patient Position: Sitting, Cuff Size: Normal)   Pulse 67   Temp 97.8 F (36.6 C) (Temporal)   Ht 6' (1.829 m)   Wt 206 lb 4.8 oz (93.6 kg)   BMI 27.98 kg/m  General:   Alert and oriented. Pleasant and cooperative. Well-nourished and well-developed.  Head:  Normocephalic and atraumatic. Eyes:  Without icterus Abdomen:  +BS, soft, non-tender and non-distended. No HSM noted. No guarding or rebound. No masses appreciated.  Rectal:  Deferred  Msk:  Symmetrical without gross deformities. Normal posture. Extremities:  Without edema. Neurologic:  Alert and  oriented x4;  grossly normal neurologically. Skin:  Intact without significant lesions or rashes. Psych:  Alert and cooperative. Normal mood and affect.  Lab Results  Component Value Date   WBC 5.6 06/11/2023   HGB 13.9 06/11/2023   HCT 42.0 06/11/2023   MCV 101 (H) 06/11/2023   PLT 166 06/11/2023      Assessment/Plan   [name] is a 77 y.o. male presenting today with a history of GERD, constipation, prior anemia with IDA component in the past, returning for routine follow-up. Last seen in Nov 2023.   Constipation not ideally managed and wants to avoid prescription agents.  Will increase stool softener to BID, add fiber, and take Miralax prn on any given day no BM.   Continue pantoprazole daily.   He desires to return in 1 year but will call if needs to be seen sooner or bowel regimen not helpful.      Gelene Mink, PhD, ANP-BC [facility]

## 2023-07-07 NOTE — Patient Instructions (Signed)
Start taking Benefiber each day.   Increase the stool softener to twice a day. On any given day if you don't have a bowel movement, take 1 capful of MIralax in 8 ounces of water.  We will see you back in 1 year! Please call if this regimen doesn't work!  I enjoyed seeing you again today! I value our relationship and want to provide genuine, compassionate, and quality care. You may receive a survey regarding your visit with me, and I welcome your feedback! Thanks so much for taking the time to complete this. I look forward to seeing you again.      Gelene Mink, PhD, ANP-BC [facility]

## 2023-08-13 ENCOUNTER — Ambulatory Visit (HOSPITAL_BASED_OUTPATIENT_CLINIC_OR_DEPARTMENT_OTHER)
Admission: RE | Admit: 2023-08-13 | Discharge: 2023-08-13 | Disposition: A | Discharge status: Home / Self Care | Payer: PPO | Source: Ambulatory Visit | Attending: Cardiovascular Disease | Admitting: Cardiovascular Disease

## 2023-08-13 ENCOUNTER — Ambulatory Visit (HOSPITAL_COMMUNITY)
Admission: RE | Admit: 2023-08-13 | Discharge: 2023-08-13 | Disposition: A | Discharge status: Home / Self Care | Payer: PPO | Source: Ambulatory Visit | Attending: Internal Medicine | Admitting: Internal Medicine

## 2023-08-13 DIAGNOSIS — I739 Peripheral vascular disease, unspecified: Secondary | ICD-10-CM | POA: Insufficient documentation

## 2023-08-13 LAB — VAS US ABI WITH/WO TBI
Left ABI: 1.07
Right ABI: 1.09

## 2023-08-17 NOTE — Progress Notes (Signed)
 History of Present Illness:   Mr Hur returns for followup of BPH.   1st visit w/ us  in 2016--previous evaluation by Dr Javaid in 2015 included a cystoscopy as well as renal U/S by Dr Javaid in 2015. At that time kidneys were normal, prostate volume was 62 mL, pvr volume was 75 mL. He has been tried on Flomax as well as Rapaflo, both of which caused rashes.  1.7.2025: Here for routine check.  He is not currently on any medical therapy for BPH.  Symptoms are minimal to him.  Rare nocturia.  Good daytime stream, no significant urgency or frequency.  Past Medical History:  Diagnosis Date   CHF (congestive heart failure) (HCC)    Coronary artery disease 08/28/2010   s/p multiple caths 2012, BMS PCI OM1 on August 28, 2010-during NSTEMI; January 2019 non-STEMI- occlusion of OM stent (very late stent thrombosis), initial wire crossed with PTCA, but unable to rewire, PCI aborted--> plan medical therapy, normal EF   Diabetes mellitus    GERD (gastroesophageal reflux disease)    Hyperlipidemia    Hypertension    Non-STEMI (non-ST elevated myocardial infarction) [facility]) January 2012 and 2019   a) Jan 2012: 99% OM1 - BMS PCI; b) Jan 2019: Very late stent thrombosis/100% OM1 -after initially causing him for repeat PTCA restoring flow, unable to recross to place stent. - >  Medical therapy.   NSTEMI (non-ST elevated myocardial infarction) (HCC)    Admitted 08/29/17 with NSTEMI-Troponin peak 8.8, normal LVF   PVD (peripheral vascular disease) (HCC)    left SFA PTA & stenting in 02/2005 (Dr. DOROTHA Lesches)   Vitamin D  deficiency disease 05/04/2019    Past Surgical History:  Procedure Laterality Date   BIOPSY  08/02/2019   Procedure: BIOPSY;  Surgeon: Shaaron Lamar HERO, MD;  Location: [facility];  Service: Endoscopy;;  gastric    CARDIAC CATHETERIZATION  12/23/2004   normal L main, normal LAD, normal L Cfx, RCA with 20% hypodense lesion in first end of vessel (Dr. DOROTHA Lesches)   CARDIAC CATHETERIZATION   08/26/2007   no significant CAD by cath, EF 50% (Dr. DOROTHA Schwalbe)   COLONOSCOPY  12/2009   Dr. Yolonda   COLONOSCOPY N/A 01/30/2017   pancolonic diverticulosis, non-bleeding internal hemorrhoids.   COLONOSCOPY WITH PROPOFOL  N/A 09/08/2019   Procedure: COLONOSCOPY WITH PROPOFOL ;  Surgeon: Shaaron Lamar HERO, MD;  Location: [facility];  Service: Endoscopy;  Laterality: N/A;  11:15am   CORONARY BALLOON ANGIOPLASTY N/A 08/30/2017   Procedure: CORONARY BALLOON ANGIOPLASTY;  Surgeon: Lesches Dorn PARAS, MD;  Location: [facility];  Service: Cardiovascular;  100% CTO very late stent thrombosis OM1 -> initially crossed with PTCA, but then unable to recross after losing my positioning.  PTCA ABORTED.  UNSUCCESSFUL ATTEMPT   CORONARY BALLOON ANGIOPLASTY  02/10/2011   95% prox in-stent restenosis within OM stent - opened with cutting balloon (Dr. IVAR Sor)   CORONARY STENT INTERVENTION  07/17/2011   in-stent restenosis - re-stented with Promus 2.25x31mm DES (Dr. CHARM Clay)   CORONARY STENT INTERVENTION  08/28/2010   NSTEMI: OM1 99% BMS PCI 2.0x62mm MiniVision BMS (Dr. 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 DOROTHA Lesches)   LEFT HEART CATH AND CORONARY ANGIOGRAPHY N/A 08/30/2017   Procedure: LEFT HEART CATH AND CORONARY ANGIOGRAPHY;  Surgeon: Lesches Dorn PARAS, MD;  Location: [facility];  Service: Cardiovascular;  100% very late stent thrombosis of Overlapped BMS-DES OM1 -> attempted PTCA   LEFT  HEART CATH AND CORONARY ANGIOGRAPHY  08/28/2010   NSTEMI: OM1 99% BMS PCI  (Dr. DOROTHA Lesches)   LEFT HEART CATH AND CORONARY ANGIOGRAPHY  09/11/2010   patent stent (Dr. CHARM Clay)   LEFT HEART CATH AND CORONARY ANGIOGRAPHY  02/10/2011   95% prox in-stent restenosis within OM stent  (Dr. IVAR Sor)   LEFT HEART CATHETERIZATION WITH CORONARY ANGIOGRAM N/A 07/17/2011   Procedure: LEFT HEART CATHETERIZATION WITH CORONARY ANGIOGRAM;  Surgeon: Alm LELON Clay, MD;  Location: [facility] CATH LAB;  Service: Cardiovascular;  Laterality: N/A;  Right radial approach;  90% ISR of BMS (5 months post PTCA for ISR) --> DES PCI   left knee arthroscopy  05/2016   NM MYOCAR PERF WALL MOTION  09/08/2013   abnormal lexiscan  - low to intermediate risk;    POLYPECTOMY  09/08/2019   Procedure: POLYPECTOMY;  Surgeon: Shaaron Lamar HERO, MD;  Location: [facility];  Service: Endoscopy;;   TOOTH EXTRACTION Right 06/19/2020   TRANSTHORACIC ECHOCARDIOGRAM  09/01/2017   Normal LV size and function.  EF 66 5%.  Normal wall motion.  GR 1 DD.  Mild aortic sclerosis.  Aortic root mildly dilated at 40 mm.    Home Medications:  Allergies as of 08/18/2023       Reactions   Crestor  [rosuvastatin ]    myalgia   Jardiance  [empagliflozin ]    Penile swelling and rash   Propoxyphene N-acetaminophen  Nausea Only   Statins Other (See Comments)   Severe muscle cramping/aching/pain/ elevated CK   Tape    Blisters   Zetia  [ezetimibe ]    Muscle cramping   Zocor  [simvastatin ]    myalgia   Flomax [tamsulosin Hcl] Rash   Flomax [tamsulosin] Rash   Levaquin [levofloxacin Hemihydrate] Rash   Levofloxacin Rash   Penicillin G Diarrhea, Rash   Penicillins Rash   Broke out in rash 6 years ago, pt recently took penicillin (09/2016) and had no reaction immediate rash, facial/tongue/throat swelling, SOB or lightheadedness with hypotension        Medication List        Accurate as of August 17, 2023 10:58 AM. If you have any questions, ask your  nurse or doctor.          acetaminophen  500 MG tablet Commonly known as: TYLENOL  Take 500 mg by mouth every 6 (six) hours as needed for moderate pain (pain score 4-6).   aspirin  81 MG chewable tablet Chew 1 tablet (81 mg total) by mouth daily.   clopidogrel  75 MG tablet Commonly known as: PLAVIX  TAKE ONE TABLET BY MOUTH ONCE DAILY.   cyclobenzaprine  10 MG tablet Commonly known as: FLEXERIL  Take 1 tablet by mouth at bedtime.   diclofenac Sodium 1 % Gel Commonly known as: VOLTAREN Apply topically as needed.   docusate sodium 100 MG capsule Commonly known as: COLACE Take 100 mg by mouth every other day.   glipiZIDE -metformin  5-500 MG tablet Commonly known as: METAGLIP  TAKE (1) TABLET BY MOUTH ONCE DAILY AT 12 NOON.   ibuprofen  200 MG tablet Commonly known as: ADVIL  Take  200 mg by mouth every 6 (six) hours as needed for mild pain (pain score 1-3).   isosorbide  mononitrate 60 MG 24 hr tablet Commonly known as: IMDUR  Take 1 tablet (60 mg total) by mouth daily.   lisinopril  5 MG tablet Commonly known as: ZESTRIL  Take 5 mg by mouth daily.   loratadine  10 MG tablet Commonly known as: CLARITIN  Take 1 tablet by mouth daily.   metoprolol  succinate 25 MG 24 hr tablet Commonly known as: TOPROL -XL TAKE 2 TABLETS BY MOUTH ONCE DAILY.   nitroGLYCERIN  0.4 MG SL tablet Commonly known as: NITROSTAT  DISSOLVE 1 TABLET UNDER TONGUE EVERY 5 MINUTES UP TO 15 MIN FOR CHEST PAIN. IF NO RELIEF CALL 911.   OneTouch Ultra test strip Generic drug: glucose blood USE AS DIRECTED UP TO 3 TIMES DAILY IF NEEDED.   Ozempic (0.25 or 0.5 MG/DOSE) 2 MG/1.5ML Sopn Generic drug: Semaglutide (0.25 or 0.5MG /DOS) Inject 0.25 mg into the skin once a week.   pantoprazole  40 MG tablet Commonly known as: PROTONIX  Take 40 mg by mouth daily.   Praluent 75 MG/ML Soaj Generic drug: Alirocumab Inject 1 Dose into the skin every 14 (fourteen) days.   ranolazine  500 MG 12 hr tablet Commonly known as:  RANEXA  Take 500 mg by mouth 2 (two) times daily.   sucralfate  1 GM/10ML suspension Commonly known as: CARAFATE  Take 10 mLs (1 g total) by mouth 4 (four) times daily. What changed:  when to take this reasons to take this   Vitamin D3 50 MCG (2000 UT) Tabs Take 3 tablets by mouth daily.        Allergies:  Allergies  Allergen Reactions   Crestor  [Rosuvastatin ]     myalgia   Jardiance  [Empagliflozin ]     Penile swelling and rash   Propoxyphene N-Acetaminophen  Nausea Only   Statins Other (See Comments)    Severe muscle cramping/aching/pain/ elevated CK   Tape     Blisters   Zetia  [Ezetimibe ]     Muscle cramping   Zocor  [Simvastatin ]     myalgia   Flomax [Tamsulosin Hcl] Rash   Flomax [Tamsulosin] Rash   Levaquin [Levofloxacin Hemihydrate] Rash   Levofloxacin Rash   Penicillin G Diarrhea and Rash   Penicillins Rash    Broke out in rash 6 years ago, pt recently took penicillin (09/2016) and had no reaction immediate rash, facial/tongue/throat swelling, SOB or lightheadedness with hypotension    Family History  Problem Relation Age of Onset   Arrhythmia Mother 81   Early death Son    Colon cancer Neg Hx    Liver disease Neg Hx    Inflammatory bowel disease Neg Hx    Colon polyps Neg Hx     Social History:  reports that he quit smoking about 24 years ago. His smoking use included cigarettes. He started smoking about 54 years ago. He has a 30 pack-year smoking history. He has been exposed to tobacco smoke. He has never used smokeless tobacco. He reports that he does not drink alcohol and does not use drugs.  ROS: A complete review of systems was performed.  All systems are negative except for pertinent findings as noted.  Physical Exam:  Vital signs in last 24 hours: There were no vitals taken for this visit. Constitutional:  Alert and oriented, No acute distress Cardiovascular: Regular rate  Respiratory: Normal respiratory effort GI: No inguinal hernia  is Genitourinary: Normal male phallus, uncircumcised, testes are descended bilaterally and non-tender and without masses, scrotum is normal in  appearance without lesions or masses, perineum is normal on inspection.  Normal anal sphincter tone prostate is 40 g, symmetric, nonnodular nontender. Lymphatic: No lymphadenopathy Neurologic: Grossly intact, no focal deficits Psychiatric: Normal mood and affect  I have reviewed prior pt notes  I have reviewed urinalysis results  I have independently reviewed prior basic metabolic panel drawn recently-GFR 62  I have reviewed prior PSA results--last year 1.3    Impression/Assessment:  1 BPH with mild symptoms, normal exam today.  2.  Screening for prostate cancer.   3.  Small, nonbothersome right hydrocele  4.  ED, not treated  Plan:  1.  I recommended not to continue with PSA screening, he is fine with this  2.  He would like to come back for recheck in a couple of years.

## 2023-08-18 ENCOUNTER — Encounter: Payer: Self-pay | Admitting: Urology

## 2023-08-18 ENCOUNTER — Ambulatory Visit: Payer: PPO | Admitting: Urology

## 2023-08-18 VITALS — BP 123/61 | HR 75

## 2023-08-18 DIAGNOSIS — N432 Other hydrocele: Secondary | ICD-10-CM

## 2023-08-18 DIAGNOSIS — Z125 Encounter for screening for malignant neoplasm of prostate: Secondary | ICD-10-CM | POA: Diagnosis not present

## 2023-08-18 DIAGNOSIS — R3915 Urgency of urination: Secondary | ICD-10-CM

## 2023-08-18 DIAGNOSIS — N401 Enlarged prostate with lower urinary tract symptoms: Secondary | ICD-10-CM

## 2023-08-18 DIAGNOSIS — R351 Nocturia: Secondary | ICD-10-CM

## 2023-08-18 DIAGNOSIS — N138 Other obstructive and reflux uropathy: Secondary | ICD-10-CM

## 2023-08-18 LAB — URINALYSIS, ROUTINE W REFLEX MICROSCOPIC
Bilirubin, UA: NEGATIVE
Glucose, UA: NEGATIVE
Ketones, UA: NEGATIVE
Leukocytes,UA: NEGATIVE
Nitrite, UA: NEGATIVE
RBC, UA: NEGATIVE
Specific Gravity, UA: 1.03 (ref 1.005–1.030)
Urobilinogen, Ur: 1 mg/dL (ref 0.2–1.0)
pH, UA: 6 (ref 5.0–7.5)

## 2023-09-14 ENCOUNTER — Ambulatory Visit: Payer: PPO | Admitting: Internal Medicine

## 2023-09-14 ENCOUNTER — Encounter: Payer: Self-pay | Admitting: Internal Medicine

## 2023-09-14 VITALS — BP 120/67 | HR 69 | Ht 72.0 in | Wt 200.1 lb

## 2023-09-14 DIAGNOSIS — Z7985 Long-term (current) use of injectable non-insulin antidiabetic drugs: Secondary | ICD-10-CM | POA: Diagnosis not present

## 2023-09-14 DIAGNOSIS — N1831 Chronic kidney disease, stage 3a: Secondary | ICD-10-CM | POA: Diagnosis not present

## 2023-09-14 DIAGNOSIS — I739 Peripheral vascular disease, unspecified: Secondary | ICD-10-CM | POA: Diagnosis not present

## 2023-09-14 DIAGNOSIS — Z9861 Coronary angioplasty status: Secondary | ICD-10-CM | POA: Diagnosis not present

## 2023-09-14 DIAGNOSIS — I2510 Atherosclerotic heart disease of native coronary artery without angina pectoris: Secondary | ICD-10-CM

## 2023-09-14 DIAGNOSIS — Z0001 Encounter for general adult medical examination with abnormal findings: Secondary | ICD-10-CM | POA: Diagnosis not present

## 2023-09-14 DIAGNOSIS — E118 Type 2 diabetes mellitus with unspecified complications: Secondary | ICD-10-CM | POA: Diagnosis not present

## 2023-09-14 DIAGNOSIS — E039 Hypothyroidism, unspecified: Secondary | ICD-10-CM | POA: Diagnosis not present

## 2023-09-14 DIAGNOSIS — N401 Enlarged prostate with lower urinary tract symptoms: Secondary | ICD-10-CM

## 2023-09-14 DIAGNOSIS — I10 Essential (primary) hypertension: Secondary | ICD-10-CM | POA: Diagnosis not present

## 2023-09-14 DIAGNOSIS — E785 Hyperlipidemia, unspecified: Secondary | ICD-10-CM | POA: Diagnosis not present

## 2023-09-14 DIAGNOSIS — R350 Frequency of micturition: Secondary | ICD-10-CM | POA: Diagnosis not present

## 2023-09-14 DIAGNOSIS — Z125 Encounter for screening for malignant neoplasm of prostate: Secondary | ICD-10-CM | POA: Diagnosis not present

## 2023-09-14 DIAGNOSIS — E1151 Type 2 diabetes mellitus with diabetic peripheral angiopathy without gangrene: Secondary | ICD-10-CM | POA: Diagnosis not present

## 2023-09-14 NOTE — Patient Instructions (Signed)
Please do not take Metformin-Glipizide for now.  Please avoid taking Ibuprofen. Okay to take Tylenol arthritis for joint pain.  Please continue to take medications as prescribed.  Please continue to follow low carb diet and perform moderate exercise/walking as tolerated.

## 2023-09-14 NOTE — Assessment & Plan Note (Addendum)
Likely has BPH, has urinary frequency -had rash with Flomax and Rapaflo Followed by Urology

## 2023-09-14 NOTE — Assessment & Plan Note (Addendum)
Physical exam as documented. Fasting blood tests today. Gets vaccinations through [place].

## 2023-09-14 NOTE — Assessment & Plan Note (Addendum)
Last BMP showed GFR of 62, has been around 55 lately Maintain adequate hydration On ACEi Avoid nephrotoxic agents Check CMP

## 2023-09-14 NOTE — Progress Notes (Signed)
Established Patient Office Visit  Subjective:  Patient ID: [name], male    DOB: [birth year]  Age: 78 y.o. MRN: [id number]  CC:  Chief Complaint  Patient presents with   Annual Exam    4 month f/u, reports d/c glipizide-metformin for about 2 weeks, reports blood sugar levels were reading low.     HPI [name] is a 78 y.o. male with past medical history of HTN, CAD, PAD, type II DM, GERD, HLD and ?Hypothyroidism who presents for annual physical.  CAD, PAD and HTN: He has had cardiac stents and stent in LLE.  He follows up with Dr. Allyson Sabal for it.  He takes aspirin, Plavix and Praluent for it.  He takes lisinopril and metoprolol as well.  He denies any chest pain, dyspnea or palpitations currently.  He is on Imdur and Ranexa for angina.  Type II DM: He is on Ozempic 0.5 mg qw currently.  His last HbA1c was 5.5 in 10/24.  He used to take Metaglip, which caused episodes of hypoglycemia since starting Ozempic from [place]. He has stopped taking Metaglip now.  His blood glucose ranges around 120 most of the time now.  He denies any polyuria or polydipsia currently.   Past Medical History:  Diagnosis Date   CHF (congestive heart failure) (HCC)    Coronary artery disease 08/28/2010   s/p multiple caths 2012, BMS PCI OM1 on August 28, 2010-during NSTEMI; January 2019 non-STEMI- occlusion of OM stent (very late stent thrombosis), initial wire crossed with PTCA, but unable to rewire, PCI aborted--> plan medical therapy, normal EF   Diabetes mellitus    GERD (gastroesophageal reflux disease)    Hyperlipidemia    Hypertension    Non-STEMI (non-ST elevated myocardial infarction) [facility]) January 2012 and 2019   a) Jan 2012: 99% OM1 - BMS PCI; b) Jan 2019: Very late stent thrombosis/100% OM1 -after initially causing him for repeat PTCA restoring flow, unable to recross to place stent. - >  Medical therapy.   NSTEMI (non-ST elevated myocardial infarction) (HCC)    Admitted 08/29/17 with  NSTEMI-Troponin peak 8.8, normal LVF   PVD (peripheral vascular disease) (HCC)    left SFA PTA & stenting in 02/2005 (Dr. Erlene Quan)   Vitamin D deficiency disease 05/04/2019    Past Surgical History:  Procedure Laterality Date   BIOPSY  08/02/2019   Procedure: BIOPSY;  Surgeon: Corbin Ade, MD;  [facility];  Service: Endoscopy;;  gastric    CARDIAC CATHETERIZATION  12/23/2004   normal L main, normal LAD, normal L Cfx, RCA with 20% hypodense lesion in first end of vessel (Dr. Erlene Quan)   CARDIAC CATHETERIZATION  08/26/2007   no significant CAD by cath, EF 50% (Dr. Evlyn Courier)   COLONOSCOPY  12/2009   Dr. Louie Casa   COLONOSCOPY N/A 01/30/2017   pancolonic diverticulosis, non-bleeding internal hemorrhoids.   COLONOSCOPY WITH PROPOFOL N/A 09/08/2019   Procedure: COLONOSCOPY WITH PROPOFOL;  Surgeon: Corbin Ade, MD;  [facility];  Service: Endoscopy;  Laterality: N/A;  11:15am   CORONARY BALLOON ANGIOPLASTY N/A 08/30/2017   Procedure: CORONARY BALLOON ANGIOPLASTY;  Surgeon: Runell Gess, MD;  [facility] IN[facility];  Service: Cardiovascular;  100% CTO very late stent thrombosis OM1 -> initially crossed with PTCA, but then unable to recross after losing my positioning.  PTCA ABORTED.  UNSUCCESSFUL ATTEMPT   CORONARY BALLOON ANGIOPLASTY  02/10/2011   95% prox in-stent restenosis within OM stent -  opened with cutting balloon (Dr. Bishop Limbo)   CORONARY STENT INTERVENTION  07/17/2011   in-stent restenosis - re-stented with Promus 2.25x82mm DES (Dr. Ranae Palms)   CORONARY STENT INTERVENTION  08/28/2010   NSTEMI: OM1 99% BMS PCI 2.0x26mm MiniVision BMS (Dr. 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 Erlene Quan)   LEFT HEART CATH AND CORONARY ANGIOGRAPHY N/A 08/30/2017   Procedure: LEFT HEART CATH AND CORONARY ANGIOGRAPHY;  Surgeon: Runell Gess, MD;  [facility];  Service: Cardiovascular;  100% very late stent thrombosis of Overlapped BMS-DES OM1 -> attempted PTCA   LEFT HEART CATH AND CORONARY ANGIOGRAPHY  08/28/2010   NSTEMI: OM1 99% BMS PCI  (Dr. Erlene Quan)   LEFT HEART CATH AND CORONARY ANGIOGRAPHY  09/11/2010   patent stent (Dr. Ranae Palms)   LEFT HEART CATH AND CORONARY ANGIOGRAPHY  02/10/2011   95% prox in-stent restenosis within OM stent  (Dr. Bishop Limbo)   LEFT HEART CATHETERIZATION WITH CORONARY ANGIOGRAM N/A 07/17/2011   Procedure: LEFT HEART CATHETERIZATION WITH CORONARY ANGIOGRAM;  Surgeon: Marykay Lex, MD;  Location: [facility] CATH LAB;  Service: Cardiovascular;  Laterality: N/A;  Right radial approach;  90% ISR of BMS (5 months post PTCA for ISR) --> DES PCI   left knee arthroscopy  05/2016   NM MYOCAR PERF WALL MOTION  09/08/2013   abnormal lexiscan - low to intermediate risk;    POLYPECTOMY  09/08/2019   Procedure: POLYPECTOMY;  Surgeon: Corbin Ade, MD;  [facility];  Service: Endoscopy;;   TOOTH EXTRACTION Right 06/19/2020   TRANSTHORACIC ECHOCARDIOGRAM  09/01/2017   Normal LV size and function.  EF 66 5%.  Normal wall motion.  GR 1 DD.  Mild aortic sclerosis.  Aortic root mildly dilated at 40 mm.    Family History  Problem Relation Age of Onset   Arrhythmia Mother 39   Early death Son    Colon cancer Neg Hx    Liver disease Neg Hx    Inflammatory bowel disease Neg Hx    Colon polyps Neg Hx     Social History   Socioeconomic History   Marital status: Married    Spouse name: [name]   Number of  children: 3   Years of education: 12   Highest education level: Some college, no degree  Occupational History   Occupation: Retired    Associate Professor: [employer]  Tobacco Use   Smoking status: Former    Current packs/day: 0.00    Average packs/day: 1 pack/day for 30.0 years (30.0 ttl pk-yrs)    Types: Cigarettes    Start date: 05/11/1969    Quit date: 05/12/1999    Years since quitting: 24.3    Passive exposure: Past   Smokeless tobacco: Never  Vaping Use   Vaping status: Never Used  Substance and Sexual Activity   Alcohol use: No   Drug use: No   Sexual activity: Yes  Other Topics Concern   Not on file  Social History Narrative   Married for 49  years.Lives with wife.Retired,ex-lab Pensions consultant.Ex-Marine,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   Social Drivers of Health   Financial Resource Strain: Low Risk  (09/15/2022)   Overall Financial Resource Strain (CARDIA)    Difficulty of Paying Living Expenses: Not hard at all  Food Insecurity: No Food Insecurity (05/26/2023)   Hunger Vital Sign    Worried About Running Out of Food in the Last Year: Never true    Ran Out of Food in the Last Year: Never true  Transportation Needs: No Transportation Needs (05/26/2023)   PRAPARE - Administrator, Civil Service (Medical): No    Lack of Transportation (Non-Medical): No  Physical Activity: Insufficiently Active (09/15/2022)   Exercise Vital Sign    Days of Exercise per Week: 3 days    Minutes of Exercise per Session: 30 min  Stress: No Stress Concern Present (09/15/2022)   [employer] of Occupational Health - Occupational Stress Questionnaire    Feeling of Stress : Not at all  Social Connections: Moderately Integrated (09/15/2022)   Social Connection and Isolation Panel [NHANES]    Frequency of Communication with Friends and Family: More than three times a week    Frequency of Social Gatherings with Friends and Family: More than three times a week    Attends Religious Services: More than  4 times per year    Active Member of [employer] or Organizations: No    Attends Banker Meetings: Never    Marital Status: Married  Catering manager Violence: Not At Risk (05/26/2023)   Humiliation, Afraid, Rape, and Kick questionnaire    Fear of Current or Ex-Partner: No    Emotionally Abused: No    Physically Abused: No    Sexually Abused: No    Outpatient Medications Prior to Visit  Medication Sig Dispense Refill   acetaminophen (TYLENOL) 500 MG tablet Take 500 mg by mouth every 6 (six) hours as needed for moderate pain (pain score 4-6).     Alirocumab (PRALUENT) 75 MG/ML SOAJ Inject 1 Dose into the skin every 14 (fourteen) days.     aspirin 81 MG chewable tablet Chew 1 tablet (81 mg total) by mouth daily.     Cholecalciferol (VITAMIN D3) 50 MCG (2000 UT) TABS Take 3 tablets by mouth daily.     clopidogrel (PLAVIX) 75 MG tablet TAKE ONE TABLET BY MOUTH ONCE DAILY. 30 tablet 9   cyclobenzaprine (FLEXERIL) 10 MG tablet Take 1 tablet by mouth at bedtime.     diclofenac Sodium (VOLTAREN) 1 % GEL Apply topically as needed.     docusate sodium (COLACE) 100 MG capsule Take 100 mg by mouth every other day.     isosorbide mononitrate (IMDUR) 60 MG 24 hr tablet Take 1 tablet (60 mg total) by mouth daily. 90 tablet 3   lisinopril (PRINIVIL,ZESTRIL) 5 MG tablet Take 5 mg by mouth daily.     loratadine (CLARITIN) 10 MG tablet Take 1 tablet by mouth daily.     metoprolol succinate (TOPROL-XL) 25 MG 24 hr tablet TAKE 2 TABLETS BY MOUTH ONCE DAILY. 180 tablet 1   nitroGLYCERIN (NITROSTAT) 0.4 MG SL tablet DISSOLVE 1 TABLET UNDER TONGUE EVERY 5 MINUTES UP TO 15 MIN FOR CHEST PAIN. IF NO RELIEF CALL 911. 25 tablet 4   ONETOUCH ULTRA test strip USE AS DIRECTED UP TO 3 TIMES DAILY IF NEEDED. 100 strip 3   pantoprazole (PROTONIX) 40 MG tablet Take 40 mg by mouth daily.     ranolazine (RANEXA) 500 MG 12 hr  tablet Take 500 mg by mouth 2 (two) times daily.     Semaglutide,0.25 or 0.5MG /DOS,  (OZEMPIC, 0.25 OR 0.5 MG/DOSE,) 2 MG/1.5ML SOPN Inject 0.25 mg into the skin once a week.     sucralfate (CARAFATE) 1 GM/10ML suspension Take 10 mLs (1 g total) by mouth 4 (four) times daily. (Patient taking differently: Take 1 g by mouth as needed (acid reflux).) 420 mL 1   ibuprofen (ADVIL) 200 MG tablet Take 200 mg by mouth every 6 (six) hours as needed for mild pain (pain score 1-3).     glipiZIDE-metformin (METAGLIP) 5-500 MG tablet TAKE (1) TABLET BY MOUTH ONCE DAILY AT 12 NOON. (Patient not taking: Reported on 09/14/2023) 90 tablet 0   No facility-administered medications prior to visit.    Allergies  Allergen Reactions   Crestor [Rosuvastatin]     myalgia   Jardiance [Empagliflozin]     Penile swelling and rash   Propoxyphene N-Acetaminophen Nausea Only   Statins Other (See Comments)    Severe muscle cramping/aching/pain/ elevated CK   Tape     Blisters   Zetia [Ezetimibe]     Muscle cramping   Zocor [Simvastatin]     myalgia   Flomax [Tamsulosin Hcl] Rash   Flomax [Tamsulosin] Rash   Levaquin [Levofloxacin Hemihydrate] Rash   Levofloxacin Rash   Penicillin G Diarrhea and Rash   Penicillins Rash    Broke out in rash 6 years ago, pt recently took penicillin (09/2016) and had no reaction immediate rash, facial/tongue/throat swelling, SOB or lightheadedness with hypotension    ROS Review of Systems  Constitutional:  Negative for chills and fever.  HENT:  Negative for congestion and sore throat.   Eyes:  Negative for pain and discharge.  Respiratory:  Negative for cough and shortness of breath.   Cardiovascular:  Negative for chest pain and palpitations.  Gastrointestinal:  Negative for diarrhea, nausea and vomiting.  Endocrine: Negative for polydipsia and polyuria.  Genitourinary:  Negative for dysuria and hematuria.       Nocturia  Musculoskeletal:  Positive for back pain. Negative for neck pain and neck stiffness.  Skin:  Negative for rash.  Neurological:  Negative  for dizziness, weakness, numbness and headaches.  Psychiatric/Behavioral:  Negative for agitation and behavioral problems.       Objective:    Physical Exam Vitals reviewed.  Constitutional:      General: He is not in acute distress.    Appearance: He is not diaphoretic.  HENT:     Head: Normocephalic and atraumatic.     Nose: Nose normal.     Mouth/Throat:     Mouth: Mucous membranes are moist.  Eyes:     General: No scleral icterus.    Extraocular Movements: Extraocular movements intact.  Cardiovascular:     Rate and Rhythm: Normal rate and regular rhythm.     Pulses: Normal pulses.     Heart sounds: Normal heart sounds. No murmur heard. Pulmonary:     Breath sounds: Normal breath sounds. No wheezing or rales.  Abdominal:     Palpations: Abdomen is soft.     Tenderness: There is no abdominal tenderness.  Musculoskeletal:     Cervical back: Neck supple. No tenderness.     Lumbar back: Tenderness (Right paraspinal) present. Negative right straight leg raise test and negative left straight leg raise test.     Right lower leg: No edema.     Left lower leg: No edema.  Skin:  General: Skin is warm.     Findings: No rash.  Neurological:     General: No focal deficit present.     Mental Status: He is alert and oriented to person, place, and time.     Cranial Nerves: No cranial nerve deficit.     Sensory: No sensory deficit.     Motor: No weakness.  Psychiatric:        Mood and Affect: Mood normal.        Behavior: Behavior normal.     BP 120/67   Pulse 69   Ht 6' (1.829 m)   Wt 200 lb 1 oz (90.7 kg)   SpO2 95%   BMI 27.13 kg/m  Wt Readings from Last 3 Encounters:  09/14/23 200 lb 1 oz (90.7 kg)  07/07/23 206 lb 4.8 oz (93.6 kg)  07/03/23 209 lb 6.4 oz (95 kg)    Lab Results  Component Value Date   TSH 0.670 01/14/2023   Lab Results  Component Value Date   WBC 5.6 06/11/2023   HGB 13.9 06/11/2023   HCT 42.0 06/11/2023   MCV 101 (H) 06/11/2023   PLT  166 06/11/2023   Lab Results  Component Value Date   NA 142 06/11/2023   K 4.2 06/11/2023   CO2 22 06/11/2023   GLUCOSE 85 06/11/2023   BUN 16 06/11/2023   CREATININE 1.22 06/11/2023   BILITOT 1.7 (H) 05/04/2023   ALKPHOS 32 (L) 05/04/2023   AST 18 05/04/2023   ALT 17 05/04/2023   PROT 6.9 05/04/2023   ALBUMIN 3.9 05/04/2023   CALCIUM 10.4 (H) 06/11/2023   ANIONGAP 7 05/26/2023   EGFR 62 06/11/2023   Lab Results  Component Value Date   CHOL 159 08/12/2022   Lab Results  Component Value Date   HDL 36 (L) 08/12/2022   Lab Results  Component Value Date   LDLCALC 76 08/12/2022   Lab Results  Component Value Date   TRIG 287 (H) 08/12/2022   Lab Results  Component Value Date   CHOLHDL 4.4 08/12/2022   Lab Results  Component Value Date   HGBA1C 5.5 05/14/2023      Assessment & Plan:   Problem List Items Addressed This Visit       Cardiovascular and Mediastinum   Peripheral vascular disease (HCC) (Chronic)   Has had stent placed in LLE Follows up with interventional cardiology On aspirin, Plavix and Praluent      Relevant Orders   Lipid panel   CAD S/P percutaneous coronary angioplasty (Chronic)   S/p stent placement Followed by Dr. Sharlett Iles On aspirin, Plavix and Praluent On beta-blocker, Imdur and Ranexa Denies any chest pain currently      Relevant Orders   Lipid panel   Essential hypertension (Chronic)   BP Readings from Last 1 Encounters:  09/14/23 120/67   Well-controlled with lisinopril and metoprolol Counseled for compliance with the medications Advised DASH diet and moderate exercise/walking      Relevant Orders   TSH   CMP14+EGFR   CBC with Differential/Platelet     Endocrine   DM (diabetes mellitus), type 2 with complications (HCC)   Lab Results  Component Value Date   HGBA1C 5.5 05/14/2023   Well-controlled Associated with HTN, HLD, CAD and PAD Now on Ozempic 0.5 mg qw through [place] clinic DC glipizide-metformin  5-500 mg QD due to hypoglycemia Had given Jardiance for additional cardiac benefit, but had rash and penile swelling, DC Jardiance Advised to follow diabetic diet  On statin and ACEi F/u CMP and HbA1C Diabetic eye exam: Advised to follow up with Ophthalmology for diabetic eye exam      Relevant Orders   Hemoglobin A1c   CMP14+EGFR   Hypothyroidism   Was started on NP thyroid by previous PCP even though his TSH was wnl Checked TSH and free T4 - DCed NP thyroid to 15 mg once daily later      Relevant Orders   TSH     Genitourinary   Stage 3a chronic kidney disease (HCC)   Last BMP showed GFR of 62, has been around 55 lately Maintain adequate hydration On ACEi Avoid nephrotoxic agents Check CMP      Relevant Orders   VITAMIN D 25 Hydroxy (Vit-D Deficiency, Fractures)   CMP14+EGFR   CBC with Differential/Platelet     Other   Hyperlipidemia with target LDL less than 70 (Chronic)   Lipid profile reviewed from [place] chart On Praluent as he did not tolerate statin in the past      Relevant Orders   Lipid panel   Encounter for general adult medical examination with abnormal findings - Primary   Physical exam as documented. Fasting blood tests today. Gets vaccinations through [place].      Benign prostatic hyperplasia with urinary frequency   Likely has BPH, has urinary frequency -had rash with Flomax and Rapaflo Followed by Urology      Relevant Orders   PSA    No orders of the defined types were placed in this encounter.    Follow-up: Return in about 6 months (around 03/13/2024) for DM and HTN.    Anabel Halon, MD

## 2023-09-14 NOTE — Assessment & Plan Note (Signed)
Has had stent placed in LLE °Follows up with interventional cardiology °On aspirin, Plavix and Praluent °

## 2023-09-14 NOTE — Assessment & Plan Note (Signed)
BP Readings from Last 1 Encounters:  09/14/23 120/67   Well-controlled with lisinopril and metoprolol Counseled for compliance with the medications Advised DASH diet and moderate exercise/walking

## 2023-09-14 NOTE — Assessment & Plan Note (Signed)
[facility] On Praluent as he did not tolerate statin in the past 

## 2023-09-14 NOTE — Assessment & Plan Note (Signed)
Was started on NP thyroid by previous PCP even though his TSH was wnl Checked TSH and free T4 - DCed NP thyroid to 15 mg once daily later

## 2023-09-14 NOTE — Assessment & Plan Note (Signed)
S/p stent placement °Followed by Dr. Berry-cardiology °On aspirin, Plavix and Praluent °On beta-blocker, Imdur and Ranexa °Denies any chest pain currently °

## 2023-09-14 NOTE — Assessment & Plan Note (Addendum)
Lab Results  Component Value Date   HGBA1C 5.5 05/14/2023   Well-controlled Associated with HTN, HLD, CAD and PAD Now on Ozempic 0.5 mg qw through [place] clinic DC glipizide-metformin 5-500 mg QD due to hypoglycemia Had given Jardiance for additional cardiac benefit, but had rash and penile swelling, DC Jardiance Advised to follow diabetic diet On statin and ACEi F/u CMP and HbA1C Diabetic eye exam: Advised to follow up with Ophthalmology for diabetic eye exam

## 2023-09-15 LAB — CBC WITH DIFFERENTIAL/PLATELET
Basophils Absolute: 0 10*3/uL (ref 0.0–0.2)
Basos: 1 %
EOS (ABSOLUTE): 0.1 10*3/uL (ref 0.0–0.4)
Eos: 2 %
Hematocrit: 37.7 % (ref 37.5–51.0)
Hemoglobin: 12.5 g/dL — ABNORMAL LOW (ref 13.0–17.7)
Immature Grans (Abs): 0 10*3/uL (ref 0.0–0.1)
Immature Granulocytes: 0 %
Lymphocytes Absolute: 2.2 10*3/uL (ref 0.7–3.1)
Lymphs: 41 %
MCH: 33.1 pg — ABNORMAL HIGH (ref 26.6–33.0)
MCHC: 33.2 g/dL (ref 31.5–35.7)
MCV: 100 fL — ABNORMAL HIGH (ref 79–97)
Monocytes Absolute: 0.4 10*3/uL (ref 0.1–0.9)
Monocytes: 7 %
Neutrophils Absolute: 2.7 10*3/uL (ref 1.4–7.0)
Neutrophils: 49 %
Platelets: 184 10*3/uL (ref 150–450)
RBC: 3.78 x10E6/uL — ABNORMAL LOW (ref 4.14–5.80)
RDW: 11.9 % (ref 11.6–15.4)
WBC: 5.4 10*3/uL (ref 3.4–10.8)

## 2023-09-15 LAB — CMP14+EGFR
ALT: 12 [IU]/L (ref 0–44)
AST: 18 [IU]/L (ref 0–40)
Albumin: 4.2 g/dL (ref 3.8–4.8)
Alkaline Phosphatase: 48 [IU]/L (ref 44–121)
BUN/Creatinine Ratio: 13 (ref 10–24)
BUN: 15 mg/dL (ref 8–27)
Bilirubin Total: 0.5 mg/dL (ref 0.0–1.2)
CO2: 23 mmol/L (ref 20–29)
Calcium: 9.8 mg/dL (ref 8.6–10.2)
Chloride: 105 mmol/L (ref 96–106)
Creatinine, Ser: 1.19 mg/dL (ref 0.76–1.27)
Globulin, Total: 2.8 g/dL (ref 1.5–4.5)
Glucose: 90 mg/dL (ref 70–99)
Potassium: 4.2 mmol/L (ref 3.5–5.2)
Sodium: 142 mmol/L (ref 134–144)
Total Protein: 7 g/dL (ref 6.0–8.5)
eGFR: 63 mL/min/{1.73_m2} (ref >59)

## 2023-09-15 LAB — VITAMIN D 25 HYDROXY (VIT D DEFICIENCY, FRACTURES): Vit D, 25-Hydroxy: 92.3 ng/mL (ref 30.0–100.0)

## 2023-09-15 LAB — HEMOGLOBIN A1C
Est. average glucose Bld gHb Est-mCnc: 114 mg/dL
Hgb A1c MFr Bld: 5.6 % (ref 4.8–5.6)

## 2023-09-15 LAB — LIPID PANEL
Chol/HDL Ratio: 3 {ratio} (ref 0.0–5.0)
Cholesterol, Total: 125 mg/dL (ref 100–199)
HDL: 41 mg/dL (ref >39)
LDL Chol Calc (NIH): 59 mg/dL (ref 0–99)
Triglycerides: 147 mg/dL (ref 0–149)
VLDL Cholesterol Cal: 25 mg/dL (ref 5–40)

## 2023-09-15 LAB — TSH: TSH: 0.998 u[IU]/mL (ref 0.450–4.500)

## 2023-09-15 LAB — PSA: Prostate Specific Ag, Serum: 1.4 ng/mL (ref 0.0–4.0)

## 2023-10-22 ENCOUNTER — Other Ambulatory Visit: Payer: Self-pay | Admitting: Gastroenterology

## 2023-10-26 NOTE — Telephone Encounter (Signed)
 Pt came to the [address] office asking about a prescription that [name] (RMR pt) was going to send in for him. He uses [facility]. [phone]

## 2023-10-28 ENCOUNTER — Telehealth: Payer: Self-pay | Admitting: *Deleted

## 2023-10-28 NOTE — Telephone Encounter (Signed)
 Pt called and needs Carafate sent to pharmacy. Last OV 07/07/2023

## 2023-11-02 NOTE — Telephone Encounter (Signed)
 [facility] pharmacy called stating that the sulcrafate suspension is too expensive and that the tablet is cheaper. Requesting the tablet be called in instead.

## 2023-11-03 MED ORDER — SUCRALFATE 1 G PO TABS: 1.0000 g | ORAL_TABLET | Freq: Three times a day (TID) | ORAL | 1 refills | Status: AC

## 2023-11-03 NOTE — Addendum Note (Signed)
 Addended by: Gelene Mink on: 11/03/2023 04:00 PM   Modules accepted: Orders

## 2023-11-03 NOTE — Telephone Encounter (Signed)
 Completed.

## 2023-11-09 DIAGNOSIS — E119 Type 2 diabetes mellitus without complications: Secondary | ICD-10-CM | POA: Diagnosis not present

## 2023-11-09 DIAGNOSIS — H2513 Age-related nuclear cataract, bilateral: Secondary | ICD-10-CM | POA: Diagnosis not present

## 2023-11-09 DIAGNOSIS — H40013 Open angle with borderline findings, low risk, bilateral: Secondary | ICD-10-CM | POA: Diagnosis not present

## 2023-11-09 LAB — HM DIABETES EYE EXAM

## 2023-11-11 ENCOUNTER — Ambulatory Visit (INDEPENDENT_AMBULATORY_CARE_PROVIDER_SITE_OTHER)

## 2023-11-11 ENCOUNTER — Encounter: Payer: Self-pay | Admitting: Cardiovascular Disease

## 2023-11-11 ENCOUNTER — Other Ambulatory Visit: Payer: Self-pay | Admitting: Cardiovascular Disease

## 2023-11-11 ENCOUNTER — Ambulatory Visit: Attending: Cardiovascular Disease | Admitting: Cardiovascular Disease

## 2023-11-11 VITALS — BP 108/62 | HR 97 | Ht 72.0 in | Wt 200.8 lb

## 2023-11-11 DIAGNOSIS — I499 Cardiac arrhythmia, unspecified: Secondary | ICD-10-CM

## 2023-11-11 DIAGNOSIS — I2510 Atherosclerotic heart disease of native coronary artery without angina pectoris: Secondary | ICD-10-CM

## 2023-11-11 DIAGNOSIS — I10 Essential (primary) hypertension: Secondary | ICD-10-CM

## 2023-11-11 DIAGNOSIS — I739 Peripheral vascular disease, unspecified: Secondary | ICD-10-CM | POA: Diagnosis not present

## 2023-11-11 DIAGNOSIS — I491 Atrial premature depolarization: Secondary | ICD-10-CM

## 2023-11-11 DIAGNOSIS — I440 Atrioventricular block, first degree: Secondary | ICD-10-CM

## 2023-11-11 DIAGNOSIS — Z9861 Coronary angioplasty status: Secondary | ICD-10-CM

## 2023-11-11 DIAGNOSIS — E785 Hyperlipidemia, unspecified: Secondary | ICD-10-CM

## 2023-11-11 NOTE — Patient Instructions (Addendum)
 Medication Instructions:  NO CHANGES *If you need a refill on your cardiac medications before your next appointment, please call your pharmacy*  Testing/Procedures:  ZIO XT- Long Term Monitor Instructions  Your physician has requested you wear a ZIO patch monitor for 3 days.  This is a single patch monitor. [employer] supplies one patch monitor per enrollment. Additional stickers are not available. Please do not apply patch if you will be having a Nuclear Stress Test,  Echocardiogram, Cardiac CT, MRI, or Chest Xray during the period you would be wearing the  monitor. The patch cannot be worn during these tests. You cannot remove and re-apply the  ZIO XT patch monitor.  Your ZIO patch monitor will be mailed 3 day USPS to your address on file. It may take 3-5 days  to receive your monitor after you have been enrolled.  Once you have received your monitor, please review the enclosed instructions. Your monitor  has already been registered assigning a specific monitor serial # to you.  Billing and Patient Assistance Program Information  We have supplied [employer] with any of your insurance information on file for billing purposes. [employer] offers a sliding scale Patient Assistance Program for patients that do not have  insurance, or whose insurance does not completely cover the cost of the ZIO monitor.  You must apply for the Patient Assistance Program to qualify for this discounted rate.  To apply, please call [employer] at [phone], select option 4, select option 2, ask to apply for  Patient Assistance Program. Meredeth Ide will ask your household income, and how many people  are in your household. They will quote your out-of-pocket cost based on that information.  [employer] will also be able to set up a 32-month, interest-free payment plan if needed.  Applying the monitor   Shave hair from upper left chest.  Hold abrader disc by orange tab. Rub abrader in 40 strokes over the upper left chest as   indicated in your monitor instructions.  Clean area with 4 enclosed alcohol pads. Let dry.  Apply patch as indicated in monitor instructions. Patch will be placed under collarbone on left  side of chest with arrow pointing upward.  Rub patch adhesive wings for 2 minutes. Remove white label marked "1". Remove the white  label marked "2". Rub patch adhesive wings for 2 additional minutes.  While looking in a mirror, press and release button in center of patch. A small green light will  flash 3-4 times. This will be your only indicator that the monitor has been turned on.  Do not shower for the first 24 hours. You may shower after the first 24 hours.  Press the button if you feel a symptom. You will hear a small click. Record Date, Time and  Symptom in the Patient Logbook.  When you are ready to remove the patch, follow instructions on the last 2 pages of Patient  Logbook. Stick patch monitor onto the last page of Patient Logbook.  Place Patient Logbook in the blue and white box. Use locking tab on box and tape box closed  securely. The blue and white box has prepaid postage on it. Please place it in the mailbox as  soon as possible. Your physician should have your test results approximately 7 days after the  monitor has been mailed back to [facility].  Call [facility] Customer Care at [phone] if you have questions regarding  your ZIO XT patch monitor. Call them immediately if you see an orange light  blinking on your  monitor.  If your monitor falls off in less than 4 days, contact our Monitor department at [phone].  If your monitor becomes loose or falls off after 4 days call [employer] at [phone] for  suggestions on securing your monitor  Lower Extremity Arterial Ultrasound of Legs in 1 year  Follow-Up: At [facility], you and your health needs are our priority.  As part of our continuing mission to provide you with exceptional heart care, our providers  are all part of one team.  This team includes your primary Cardiologist (physician) and Advanced Practice Providers or APPs (Physician Assistants and Nurse Practitioners) who all work together to provide you with the care you need, when you need it.  Your next appointment:   12 months with Dr. Allyson Sabal  We recommend signing up for the patient portal called "MyChart".  Sign up information is provided on this After Visit Summary.  MyChart is used to connect with patients for Virtual Visits (Telemedicine).  Patients are able to view lab/test results, encounter notes, upcoming appointments, etc.  Non-urgent messages can be sent to your provider as well.   To learn more about what you can do with MyChart, go to [email or web address].        1st Floor: - Lobby - Registration  - Pharmacy  - Lab - Cafe  2nd Floor: - PV Lab - Diagnostic Testing (echo, CT, nuclear med)  3rd Floor: - Vacant  4th Floor: - TCTS (cardiothoracic surgery) - AFib Clinic - Structural Heart Clinic - Vascular Surgery  - Vascular Ultrasound  5th Floor: - HeartCare Cardiology (general and EP) - Clinical Pharmacy for coumadin, hypertension, lipid, weight-loss medications, and med management appointments    Valet parking services will be available as well.

## 2023-11-11 NOTE — Assessment & Plan Note (Signed)
 History of PAD status post left SFA PTA and stenting by myself July 2006.  He denies claudication.  His most recent Doppler studies revealed normal ABIs and a patent left SFA stent performed 08/13/2023.  Will continue to follow his noninvasive studies on an annual basis.

## 2023-11-11 NOTE — Assessment & Plan Note (Addendum)
 History of hyperlipidemia not on statin (on Praluent) therapy revealing total cholesterol of 125, LDL 59 and HDL of 41.  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

## 2023-11-11 NOTE — Assessment & Plan Note (Signed)
 History of CAD status post catheterization by myself 08/28/2010 with stenting of his first obtuse marginal branch with a bare-metal stent.  He was readmitted 09/09/2010 with recurrent chest pain and had repeat catheterization Dr. Herbie Baltimore via the right radial approach revealing widely patent stent.  He had cath by Dr. Tresa Endo 02/10/2012 revealing 95% proximal in-stent restenosis within the LM stent which was opened using Cutting Balloon atherectomy.  She has had repeat catheterization 08/30/2017 revealing an occluded OM branch which I was unable to recanalize.  Stress test performed 06/28/2020 showed no ischemia.  He was recently hospitalized with atypical chest pain in October 2024 with chest pain and subsequent Myoview stress test performed 05/27/2023 was low risk and nonischemic.  He denies recurrent chest pain.

## 2023-11-11 NOTE — Progress Notes (Unsigned)
 Enrolled for [employer] to mail a ZIO XT long term holter monitor to the patients address on file.

## 2023-11-11 NOTE — Assessment & Plan Note (Signed)
 History of essential hypertension her blood pressure measured today at 108/62.  He is on lisinopril and metoprolol.

## 2023-11-11 NOTE — Progress Notes (Signed)
 11/11/2023 [name]   September 08, 1945  [id number]  Primary Physician Ricky Halon, MD Primary Cardiologist: Ricky Gess MD FACP, [place], [place], [place]  HPI:  [name] is a 78 y.o.   mildly overweight married African American male father of 3 who I last saw in the office 10/27/2022.Marland Kitchen  He did see Ricky Person, NP in the office 10/30/2021.  He has a history of PVOD status post left SFA, PTA and stenting by myself back in July of 2006 with subsequent improvement in his claudication and Dopplers. These were last done in April of last year and showed ABIs of greater than 1 bilaterally. His other problems include hypertension, hyperlipidemia, non-insulin-requiring diabetes and statin intolerance. I catheterized him August 28, 2010 and stented his first OM branch with a bare metal stent. He was readmitted September 09, 2010 with recurrent chest pain and was re-cathed by Dr. Herbie Lucas via the right radial approach revealing a widely patent stent. He was admitted again June 29th through July 3rd with chest pain and ruled out for myocardial infarction. His stress test showed subtle lateral ischemia and cath performed by Dr. Tresa Lucas February 10, 2012 revealed 95% proximal in-stent restenosis within the OM stent which Dr. Tresa Lucas opened up with a cutting balloon. He had done well until December of last year when he was admitted again with chest pain. Dr. Herbie Lucas recathed him revealing again in-stent restenosis which was "restented" with a Promus drug-eluting stent. He was having chest pain when I saw him back in December, however, since that time he has had minimal chest pain and he does walk 3-5 miles a day. His most recent liver profile performed 08/22/15 revealed an LDL of 94 and HDL 47 on Zocor 20 mg a day. He was recently admitted with chest pain/rule out MI 08/30/13. This is either negative. 2-D echo was essentially unremarkable with mild inferoseptal hypokinesia and a Myoview stress test was read as low risk. His  medications were adjusted. Ranexa was added..He has had no recurrent chest pain since I saw him a year ago until recently when he was taking his Ranexa once a day instead of twice a day. Since I saw him a year ago he denies claudication but does complain of new onset chest pain several weeks ago which is exertional Recatheterized him revealing a totally occluded first obtuse marginal branch stent which I was unable to open.  The remainder of his coronary anatomy was free of significant disease and his LV function was normal.  The decision was made to treat him medically at that time with Imdur and regular ranolazine.   Since I saw him in the office 1 year ago he was admitted to the hospital in October of last year with chest pain.  He ruled out for myocardial infarction.  A Myoview stress test performed 05/27/2023 was low risk and nonischemic.  In retrospect, he thought his pain may have been related to reflux.  He said no subsequent symptoms.  He also denies claudication.  His most recent lower extremity arterial Doppler studies performed 08/13/2023 revealed normal ABIs bilaterally with a patent left SFA stent.     Current Meds  Medication Sig   acetaminophen (TYLENOL) 500 MG tablet Take 500 mg by mouth every 6 (six) hours as needed for moderate pain (pain score 4-6).   Alirocumab (PRALUENT) 75 MG/ML SOAJ Inject 1 Dose into the skin every 14 (fourteen) days.   aspirin 81 MG chewable tablet Chew 1  tablet (81 mg total) by mouth daily.   Cholecalciferol (VITAMIN D3) 50 MCG (2000 UT) TABS Take 3 tablets by mouth daily.   clopidogrel (PLAVIX) 75 MG tablet TAKE ONE TABLET BY MOUTH ONCE DAILY.   cyclobenzaprine (FLEXERIL) 10 MG tablet Take 1 tablet by mouth at bedtime.   diclofenac Sodium (VOLTAREN) 1 % GEL Apply topically as needed.   docusate sodium (COLACE) 100 MG capsule Take 100 mg by mouth every other day.   isosorbide mononitrate (IMDUR) 60 MG 24 hr tablet Take 1 tablet (60 mg total) by mouth daily.    lisinopril (PRINIVIL,ZESTRIL) 5 MG tablet Take 5 mg by mouth daily.   loratadine (CLARITIN) 10 MG tablet Take 1 tablet by mouth daily.   metoprolol succinate (TOPROL-XL) 25 MG 24 hr tablet TAKE 2 TABLETS BY MOUTH ONCE DAILY.   nitroGLYCERIN (NITROSTAT) 0.4 MG SL tablet DISSOLVE 1 TABLET UNDER TONGUE EVERY 5 MINUTES UP TO 15 MIN FOR CHEST PAIN. IF NO RELIEF CALL 911.   ONETOUCH ULTRA test strip USE AS DIRECTED UP TO 3 TIMES DAILY IF NEEDED.   pantoprazole (PROTONIX) 40 MG tablet Take 40 mg by mouth daily.   ranolazine (RANEXA) 500 MG 12 hr tablet Take 500 mg by mouth 2 (two) times daily.   Semaglutide,0.25 or 0.5MG /DOS, (OZEMPIC, 0.25 OR 0.5 MG/DOSE,) 2 MG/1.5ML SOPN Inject 0.25 mg into the skin once a week.   sucralfate (CARAFATE) 1 GM/10ML suspension TAKE 10 ML BY MOUTH FOUR TIMES DAILY     Allergies  Allergen Reactions   Crestor [Rosuvastatin]     myalgia   Jardiance [Empagliflozin]     Penile swelling and rash   Propoxyphene N-Acetaminophen Nausea Only   Statins Other (See Comments)    Severe muscle cramping/aching/pain/ elevated CK   Tape     Blisters   Zetia [Ezetimibe]     Muscle cramping   Zocor [Simvastatin]     myalgia   Flomax [Tamsulosin Hcl] Rash   Flomax [Tamsulosin] Rash   Levaquin [Levofloxacin Hemihydrate] Rash   Levofloxacin Rash   Penicillin G Diarrhea and Rash   Penicillins Rash    Broke out in rash 6 years ago, pt recently took penicillin (09/2016) and had no reaction immediate rash, facial/tongue/throat swelling, SOB or lightheadedness with hypotension    Social History   Socioeconomic History   Marital status: Married    Spouse name: [name]   Number of children: 3   Years of education: 12   Highest education level: Some college, no degree  Occupational History   Occupation: Retired    Associate Professor: [employer]  Tobacco Use   Smoking status: Former    Current packs/day: 0.00    Average packs/day: 1 pack/day for 30.0 years (30.0 ttl pk-yrs)     Types: Cigarettes    Start date: 05/11/1969    Quit date: 05/12/1999    Years since quitting: 24.5    Passive exposure: Past   Smokeless tobacco: Never  Vaping Use   Vaping status: Never Used  Substance and Sexual Activity   Alcohol use: No   Drug use: No   Sexual activity: Yes  Other Topics Concern   Not on file  Social History Narrative   Married for 49 years.Lives with wife.Retired,ex-lab Pensions consultant.Ex-Marine,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   Social Drivers of Health   Financial Resource Strain: Low Risk  (09/15/2022)   Overall Financial Resource Strain (CARDIA)    Difficulty of Paying Living Expenses: Not hard at all  Food Insecurity: No Food  Insecurity (05/26/2023)   Hunger Vital Sign    Worried About Running Out of Food in the Last Year: Never true    Ran Out of Food in the Last Year: Never true  Transportation Needs: No Transportation Needs (05/26/2023)   PRAPARE - Administrator, Civil Service (Medical): No    Lack of Transportation (Non-Medical): No  Physical Activity: Insufficiently Active (09/15/2022)   Exercise Vital Sign    Days of Exercise per Week: 3 days    Minutes of Exercise per Session: 30 min  Stress: No Stress Concern Present (09/15/2022)   [employer] of Occupational Health - Occupational Stress Questionnaire    Feeling of Stress : Not at all  Social Connections: Moderately Integrated (09/15/2022)   Social Connection and Isolation Panel [NHANES]    Frequency of Communication with Friends and Family: More than three times a week    Frequency of Social Gatherings with Friends and Family: More than three times a week    Attends Religious Services: More than 4 times per year    Active Member of [employer] or Organizations: No    Attends Banker Meetings: Never    Marital Status: Married  Catering manager Violence: Not At Risk (05/26/2023)   Humiliation, Afraid, Rape, and Kick questionnaire    Fear of Current or Ex-Partner: No    Emotionally  Abused: No    Physically Abused: No    Sexually Abused: No     Review of Systems: General: negative for chills, fever, night sweats or weight changes.  Cardiovascular: negative for chest pain, dyspnea on exertion, edema, orthopnea, palpitations, paroxysmal nocturnal dyspnea or shortness of breath Dermatological: negative for rash Respiratory: negative for cough or wheezing Urologic: negative for hematuria Abdominal: negative for nausea, vomiting, diarrhea, bright red blood per rectum, melena, or hematemesis Neurologic: negative for visual changes, syncope, or dizziness All other systems reviewed and are otherwise negative except as noted above.    Blood pressure 108/62, pulse 97, height 6' (1.829 m), weight 200 lb 12.8 oz (91.1 kg), SpO2 97%.  General appearance: alert and no distress Neck: no adenopathy, no carotid bruit, no JVD, supple, symmetrical, trachea midline, and thyroid not enlarged, symmetric, no tenderness/mass/nodules Lungs: clear to auscultation bilaterally Heart: regular rate and rhythm, S1, S2 normal, no murmur, click, rub or gallop Extremities: extremities normal, atraumatic, no cyanosis or edema Pulses: 2+ and symmetric Skin: Skin color, texture, turgor normal. No rashes or lesions Neurologic: Grossly normal  EKG not performed today      ASSESSMENT AND PLAN:   Peripheral vascular disease (HCC) History of PAD status post left SFA PTA and stenting by myself July 2006.  He denies claudication.  His most recent Doppler studies revealed normal ABIs and a patent left SFA stent performed 08/13/2023.  Will continue to follow his noninvasive studies on an annual basis.  CAD S/P percutaneous coronary angioplasty History of CAD status post catheterization by myself 08/28/2010 with stenting of his first obtuse marginal branch with a bare-metal stent.  He was readmitted 09/09/2010 with recurrent chest pain and had repeat catheterization Dr. Herbie Lucas via the right radial approach  revealing widely patent stent.  He had cath by Dr. Tresa Lucas 02/10/2012 revealing 95% proximal in-stent restenosis within the LM stent which was opened using Cutting Balloon atherectomy.  She has had repeat catheterization 08/30/2017 revealing an occluded OM branch which I was unable to recanalize.  Stress test performed 06/28/2020 showed no ischemia.  He was recently hospitalized with atypical  chest pain in October 2024 with chest pain and subsequent Myoview stress test performed 05/27/2023 was low risk and nonischemic.  He denies recurrent chest pain.  Hyperlipidemia with target LDL less than 70 History of hyperlipidemia not on statin (on Praluent) therapy revealing total cholesterol of 125, LDL 59 and HDL of 41.  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  Essential hypertension History of essential hypertension her blood pressure measured today at 108/62.  He is on lisinopril and metoprolol.     Ricky Gess MD FACP,FACC,FAHA, [facility] 11/11/2023 10:24 AM

## 2023-11-20 ENCOUNTER — Other Ambulatory Visit: Payer: Self-pay | Admitting: Cardiovascular Disease

## 2023-11-26 DIAGNOSIS — I440 Atrioventricular block, first degree: Secondary | ICD-10-CM | POA: Diagnosis not present

## 2023-11-26 DIAGNOSIS — I499 Cardiac arrhythmia, unspecified: Secondary | ICD-10-CM | POA: Diagnosis not present

## 2023-11-29 DIAGNOSIS — I440 Atrioventricular block, first degree: Secondary | ICD-10-CM | POA: Diagnosis not present

## 2023-11-29 DIAGNOSIS — I2510 Atherosclerotic heart disease of native coronary artery without angina pectoris: Secondary | ICD-10-CM

## 2023-11-29 DIAGNOSIS — I499 Cardiac arrhythmia, unspecified: Secondary | ICD-10-CM

## 2023-11-29 DIAGNOSIS — I491 Atrial premature depolarization: Secondary | ICD-10-CM | POA: Diagnosis not present

## 2023-12-03 ENCOUNTER — Other Ambulatory Visit: Payer: Self-pay | Admitting: Gastroenterology

## 2024-01-06 ENCOUNTER — Telehealth: Payer: Self-pay | Admitting: Pharmacy Technician

## 2024-01-06 ENCOUNTER — Other Ambulatory Visit (HOSPITAL_COMMUNITY): Payer: Self-pay

## 2024-01-06 NOTE — Telephone Encounter (Signed)
 Pharmacy Patient Advocate Encounter   Received notification from CoverMyMeds that prior authorization for Cyclobenzaprine  HCl 5MG  tablets is required/requested.   Insurance verification completed.   The patient is insured through [facility] ADVANTAGE/RX ADVANCE .    Please advise if patient is still currently on this medication as I do not see an active prescription for this medication on patient's current medication list? If so please provide new RX and associated diagnosis.

## 2024-01-06 NOTE — Telephone Encounter (Signed)
 Pharmacy Patient Advocate Encounter   Received notification from CoverMyMeds that prior authorization for Cyclobenzaprine  HCl 5MG  tablets is required/requested.   Insurance verification completed.   The patient is insured through [facility] ADVANTAGE/RX ADVANCE .   Per test claim: PA required; PA submitted to above mentioned insurance via CoverMyMeds Key/confirmation #/EOC [id number] Status is pending

## 2024-01-07 ENCOUNTER — Other Ambulatory Visit (HOSPITAL_COMMUNITY): Payer: Self-pay

## 2024-01-07 NOTE — Telephone Encounter (Signed)
 Pharmacy Patient Advocate Encounter  Received notification from [facility] ADVANTAGE/RX ADVANCE that Prior Authorization for Cyclobenzaprine  HCl 5MG  tablets has been APPROVED from 01/06/2024 to 02/03/2024. Ran test claim, Copay is $1.04. This test claim was processed through [facility]- copay amounts may vary at other pharmacies due to pharmacy/plan contracts, or as the patient moves through the different stages of their insurance plan.   PA #/Case ID/Reference #: [id number]

## 2024-01-24 NOTE — Progress Notes (Signed)
 Impression/Assessment:  1 BPH with mild symptoms, normal exam today.  2.  Screening for prostate cancer.   3.  Small, nonbothersome right hydrocele  4.  ED, not treated  Plan:    History of Present Illness:   [name] returns for followup of BPH.   1st visit w/ us  in 2016--previous evaluation by Dr Javaid in 2015 included a cystoscopy as well as renal U/S by Dr Javaid in 2015. At that time kidneys were normal, prostate volume was 62 mL, pvr volume was 75 mL. He has been tried on Flomax as well as Rapaflo, both of which caused rashes.  1.7.2025: Here for routine check.  He is not currently on any medical therapy for BPH.  Symptoms are minimal to him.  Rare nocturia.  Good daytime stream, no significant urgency or frequency.  Past Medical History:  Diagnosis Date   CHF (congestive heart failure) (HCC)    Coronary artery disease 08/28/2010   s/p multiple caths 2012, BMS PCI OM1 on August 28, 2010-during NSTEMI; January 2019 non-STEMI- occlusion of OM stent (very late stent thrombosis), initial wire crossed with PTCA, but unable to rewire, PCI aborted--> plan medical therapy, normal EF   Diabetes mellitus    GERD (gastroesophageal reflux disease)    Hyperlipidemia    Hypertension    Non-STEMI (non-ST elevated myocardial infarction) [facility]) January 2012 and 2019   a) Jan 2012: 99% OM1 - BMS PCI; b) Jan 2019: Very late stent thrombosis/100% OM1 -after initially causing him for repeat PTCA restoring flow, unable to recross to place stent. - >  Medical therapy.   NSTEMI (non-ST elevated myocardial infarction) (HCC)    Admitted 08/29/17 with NSTEMI-Troponin peak 8.8, normal LVF   PVD (peripheral vascular disease) (HCC)    left SFA PTA & stenting in 02/2005 (Dr. Aleda Ammon)   Vitamin D  deficiency disease 05/04/2019    Past Surgical History:  Procedure Laterality Date   BIOPSY  08/02/2019   Procedure: BIOPSY;  Surgeon: Suzette Espy, MD;  Location: [facility];  Service: Endoscopy;;   gastric    CARDIAC CATHETERIZATION  12/23/2004   normal L main, normal LAD, normal L Cfx, RCA with 20% hypodense lesion in first end of vessel (Dr. Aleda Ammon)   CARDIAC CATHETERIZATION  08/26/2007   no significant CAD by cath, EF 50% (Dr. Jammie Mccune)   COLONOSCOPY  12/2009   Dr. Amon Kand   COLONOSCOPY N/A 01/30/2017   pancolonic diverticulosis, non-bleeding internal hemorrhoids.   COLONOSCOPY WITH PROPOFOL  N/A 09/08/2019   Procedure: COLONOSCOPY WITH PROPOFOL ;  Surgeon: Suzette Espy, MD;  Location: [facility];  Service: Endoscopy;  Laterality: N/A;  11:15am   CORONARY BALLOON ANGIOPLASTY N/A 08/30/2017   Procedure: CORONARY BALLOON ANGIOPLASTY;  Surgeon: Avanell Leigh, MD;  Location: [facility];  Service: Cardiovascular;  100% CTO very late stent thrombosis OM1 -> initially crossed with PTCA, but then unable to recross after losing my positioning.  PTCA ABORTED.  UNSUCCESSFUL ATTEMPT   CORONARY BALLOON ANGIOPLASTY  02/10/2011   95% prox in-stent restenosis within OM stent - opened with cutting balloon (Dr. Electa Grieve)   CORONARY STENT INTERVENTION  07/17/2011   in-stent restenosis - re-stented with Promus 2.25x79mm DES (Dr. Truddie Furrow)   CORONARY STENT INTERVENTION  08/28/2010   NSTEMI: OM1 99% BMS PCI 2.0x54mm MiniVision BMS (Dr. 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 Aleda Ammon)   LEFT HEART CATH AND CORONARY ANGIOGRAPHY N/A 08/30/2017   Procedure: LEFT HEART CATH AND CORONARY  ANGIOGRAPHY;  Surgeon: Avanell Leigh, MD;  Location: [facility];  Service: Cardiovascular;  100% very late stent thrombosis of Overlapped BMS-DES OM1 -> attempted PTCA   LEFT HEART CATH AND CORONARY ANGIOGRAPHY  08/28/2010   NSTEMI: OM1 99% BMS PCI  (Dr. Aleda Ammon)   LEFT HEART CATH AND CORONARY ANGIOGRAPHY  09/11/2010   patent stent (Dr. Truddie Furrow)   LEFT HEART CATH AND CORONARY ANGIOGRAPHY  02/10/2011   95% prox in-stent restenosis within OM stent  (Dr. Electa Grieve)   LEFT HEART CATHETERIZATION WITH CORONARY ANGIOGRAM N/A 07/17/2011   Procedure: LEFT HEART CATHETERIZATION WITH CORONARY ANGIOGRAM;  Surgeon: Arleen Lacer, MD;  [facility] C[facility];  Service: Cardiovascular;  Laterality: N/A;  Right radial approach;  90% ISR of BMS (5 months post PTCA for ISR) --> DES PCI   left knee arthroscopy  05/2016   NM MYOCAR PERF WALL MOTION  09/08/2013   abnormal lexiscan  - low to intermediate risk;    POLYPECTOMY  09/08/2019   Procedure: POLYPECTOMY;  Surgeon: Suzette Espy, MD;  Location: [facility];  Service: Endoscopy;;   TOOTH EXTRACTION Right 06/19/2020   TRANSTHORACIC ECHOCARDIOGRAM  09/01/2017   Normal LV size and function.  EF 66 5%.  Normal wall motion.  GR 1 DD.  Mild aortic sclerosis.  Aortic root mildly dilated at 40 mm.    Home Medications:  Allergies as of 01/26/2024       Reactions   Crestor  [rosuvastatin ]    myalgia   Jardiance  [empagliflozin ]    Penile swelling and rash   Propoxyphene N-acetaminophen  Nausea Only   Statins Other (See Comments)   Severe muscle cramping/aching/pain/ elevated CK   Tape    Blisters   Zetia  [ezetimibe ]    Muscle cramping   Zocor  [simvastatin ]    myalgia   Flomax [tamsulosin Hcl] Rash   Flomax [tamsulosin] Rash   Levaquin [levofloxacin Hemihydrate] Rash   Levofloxacin Rash   Penicillin G Diarrhea, Rash   Penicillins Rash   Broke out in rash 6 years ago, pt recently took penicillin (09/2016) and had no reaction immediate rash,  facial/tongue/throat swelling, SOB or lightheadedness with hypotension        Medication List        Accurate as of January 24, 2024  2:46 PM. If you have any questions, ask your nurse or doctor.          acetaminophen  500 MG tablet Commonly known as: TYLENOL  Take 500 mg by mouth every 6 (six) hours as needed for moderate pain (pain score 4-6).   aspirin  81 MG chewable tablet Chew 1 tablet (81 mg total) by mouth daily.   clopidogrel  75 MG tablet Commonly known as: PLAVIX  TAKE ONE TABLET BY MOUTH ONCE DAILY.   cyclobenzaprine  10 MG tablet Commonly known as: FLEXERIL  Take 1 tablet by mouth at bedtime.   diclofenac Sodium 1 % Gel Commonly known as: VOLTAREN Apply topically as needed.   docusate sodium 100 MG capsule Commonly known as: COLACE Take 100  mg by mouth every other day.   isosorbide  mononitrate 60 MG 24 hr tablet Commonly known as: IMDUR  Take 1 tablet (60 mg total) by mouth daily.   lisinopril  5 MG tablet Commonly known as: ZESTRIL  Take 5 mg by mouth daily.   loratadine  10 MG tablet Commonly known as: CLARITIN  Take 1 tablet by mouth daily.   metoprolol  succinate 25 MG 24 hr tablet Commonly known as: TOPROL -XL TAKE 2 TABLETS BY MOUTH ONCE DAILY.   nitroGLYCERIN  0.4 MG SL tablet Commonly known as: NITROSTAT  DISSOLVE 1 TABLET UNDER TONGUE EVERY 5 MINUTES UP TO 15 MIN FOR CHEST PAIN. IF NO RELIEF CALL 911.   OneTouch Ultra test strip Generic drug: glucose blood USE AS DIRECTED UP TO 3 TIMES DAILY IF NEEDED.   Ozempic (0.25 or 0.5 MG/DOSE) 2 MG/1.5ML Sopn Generic drug: Semaglutide (0.25 or 0.5MG /DOS) Inject 0.25 mg into the skin once a week.   pantoprazole  40 MG tablet Commonly known as: PROTONIX  Take 40 mg by mouth daily.   Praluent 75 MG/ML Soaj Generic drug: Alirocumab Inject 1 Dose into the skin every 14 (fourteen) days.   ranolazine  500 MG 12 hr tablet Commonly known as: RANEXA  Take 500 mg by mouth 2 (two) times daily.   sucralfate  1 g  tablet Commonly known as: Carafate  Take 1 tablet (1 g total) by mouth 4 (four) times daily -  with meals and at bedtime.   sucralfate  1 GM/10ML suspension Commonly known as: CARAFATE  TAKE 10 ML BY MOUTH FOUR TIMES DAILY   Vitamin D3 50 MCG (2000 UT) Tabs Take 3 tablets by mouth daily.        Allergies:  Allergies  Allergen Reactions   Crestor  [Rosuvastatin ]     myalgia   Jardiance  [Empagliflozin ]     Penile swelling and rash   Propoxyphene N-Acetaminophen  Nausea Only   Statins Other (See Comments)    Severe muscle cramping/aching/pain/ elevated CK   Tape     Blisters   Zetia  [Ezetimibe ]     Muscle cramping   Zocor  [Simvastatin ]     myalgia   Flomax [Tamsulosin Hcl] Rash   Flomax [Tamsulosin] Rash   Levaquin [Levofloxacin Hemihydrate] Rash   Levofloxacin Rash   Penicillin G Diarrhea and Rash   Penicillins Rash    Broke out in rash 6 years ago, pt recently took penicillin (09/2016) and had no reaction immediate rash, facial/tongue/throat swelling, SOB or lightheadedness with hypotension    Family History  Problem Relation Age of Onset   Arrhythmia Mother 87   Early death Son    Colon cancer Neg Hx    Liver disease Neg Hx    Inflammatory bowel disease Neg Hx    Colon polyps Neg Hx     Social History:  reports that he quit smoking about 24 years ago. His smoking use included cigarettes. He started smoking about 54 years ago. He has a 30 pack-year smoking history. He has been exposed to tobacco smoke. He has never used smokeless tobacco. He reports that he does not drink alcohol and does not use drugs.  ROS: A complete review of systems was performed.  All systems are negative except for pertinent findings as noted.  Physical Exam:  Vital signs in last 24 hours: There were no vitals taken for this visit. Constitutional:  Alert and oriented, No acute distress Cardiovascular: Regular rate  Respiratory: Normal respiratory effort GI: No inguinal hernia  is Genitourinary: Normal male phallus, uncircumcised, testes are descended bilaterally and non-tender and without masses,  scrotum is normal in appearance without lesions or masses, perineum is normal on inspection.  Normal anal sphincter tone prostate is 40 g, symmetric, nonnodular nontender. Lymphatic: No lymphadenopathy Neurologic: Grossly intact, no focal deficits Psychiatric: Normal mood and affect  I have reviewed prior pt notes  I have reviewed urinalysis results  I have independently reviewed prior basic metabolic panel drawn recently-GFR 20  I have reviewed prior PSA results--last year 1.3

## 2024-01-26 ENCOUNTER — Other Ambulatory Visit: Payer: Self-pay | Admitting: Internal Medicine

## 2024-01-26 ENCOUNTER — Ambulatory Visit: Admitting: Urology

## 2024-01-26 VITALS — BP 113/64 | HR 34

## 2024-01-26 DIAGNOSIS — R351 Nocturia: Secondary | ICD-10-CM

## 2024-01-26 DIAGNOSIS — R3915 Urgency of urination: Secondary | ICD-10-CM

## 2024-01-26 DIAGNOSIS — N138 Other obstructive and reflux uropathy: Secondary | ICD-10-CM

## 2024-01-26 DIAGNOSIS — N432 Other hydrocele: Secondary | ICD-10-CM

## 2024-01-26 DIAGNOSIS — N401 Enlarged prostate with lower urinary tract symptoms: Secondary | ICD-10-CM | POA: Diagnosis not present

## 2024-01-26 DIAGNOSIS — M545 Low back pain, unspecified: Secondary | ICD-10-CM

## 2024-01-26 MED ORDER — CYCLOBENZAPRINE HCL 5 MG PO TABS: 5.0000 mg | ORAL_TABLET | Freq: Every evening | ORAL | 1 refills | Status: AC | PRN

## 2024-01-27 LAB — URINALYSIS, ROUTINE W REFLEX MICROSCOPIC
Bilirubin, UA: NEGATIVE
Glucose, UA: NEGATIVE
Ketones, UA: NEGATIVE
Leukocytes,UA: NEGATIVE
Nitrite, UA: NEGATIVE
Protein,UA: NEGATIVE
RBC, UA: NEGATIVE
Specific Gravity, UA: 1.03 (ref 1.005–1.030)
Urobilinogen, Ur: 1 mg/dL (ref 0.2–1.0)
pH, UA: 6 (ref 5.0–7.5)

## 2024-03-16 ENCOUNTER — Ambulatory Visit: Payer: PPO | Admitting: Internal Medicine

## 2024-04-19 ENCOUNTER — Encounter: Payer: Self-pay | Admitting: Internal Medicine

## 2024-04-19 ENCOUNTER — Ambulatory Visit: Payer: Self-pay | Admitting: Internal Medicine

## 2024-04-19 VITALS — BP 111/68 | HR 73 | Ht 72.0 in | Wt 196.2 lb

## 2024-04-19 DIAGNOSIS — I2089 Other forms of angina pectoris: Secondary | ICD-10-CM

## 2024-04-19 DIAGNOSIS — I739 Peripheral vascular disease, unspecified: Secondary | ICD-10-CM | POA: Diagnosis not present

## 2024-04-19 DIAGNOSIS — E118 Type 2 diabetes mellitus with unspecified complications: Secondary | ICD-10-CM | POA: Diagnosis not present

## 2024-04-19 DIAGNOSIS — Z23 Encounter for immunization: Secondary | ICD-10-CM | POA: Diagnosis not present

## 2024-04-19 DIAGNOSIS — N401 Enlarged prostate with lower urinary tract symptoms: Secondary | ICD-10-CM | POA: Diagnosis not present

## 2024-04-19 DIAGNOSIS — E785 Hyperlipidemia, unspecified: Secondary | ICD-10-CM

## 2024-04-19 DIAGNOSIS — R350 Frequency of micturition: Secondary | ICD-10-CM

## 2024-04-19 DIAGNOSIS — N1831 Chronic kidney disease, stage 3a: Secondary | ICD-10-CM

## 2024-04-19 DIAGNOSIS — I10 Essential (primary) hypertension: Secondary | ICD-10-CM

## 2024-04-19 NOTE — Assessment & Plan Note (Signed)
On Ranexa and Imdur - denies chest pain episodes now Has Nitroglycerin PRN Followed by Cardiology

## 2024-04-19 NOTE — Assessment & Plan Note (Addendum)
 Has had stent placed in LLE Follows up with interventional cardiology On Plavix  and Praluent

## 2024-04-19 NOTE — Assessment & Plan Note (Signed)
[facility] On Praluent as he did not tolerate statin in the past 

## 2024-04-19 NOTE — Assessment & Plan Note (Addendum)
 Last BMP showed GFR of 63, was around 55 previously Maintain adequate hydration On ACEi Avoid nephrotoxic agents Check CMP, urine microalbumin/creatinine ratio

## 2024-04-19 NOTE — Assessment & Plan Note (Signed)
 BP Readings from Last 1 Encounters:  04/19/24 111/68   Well-controlled with lisinopril  5 mg QD and metoprolol  50 mg QD Counseled for compliance with the medications Advised DASH diet and moderate exercise/walking

## 2024-04-19 NOTE — Progress Notes (Signed)
 Established Patient Office Visit  Subjective:  Patient ID: [name], male    DOB: [birth year]  Age: 78 y.o. MRN: [id number]  CC:  Chief Complaint  Patient presents with   Diabetes    6 month f/u    Hypertension    6 month f/u     HPI [name] is a 78 y.o. male with past medical history of HTN, CAD, PAD, type II DM, GERD, HLD and ?Hypothyroidism who presents for f/u of his chronic medical conditions.  CAD, PAD and HTN: He has had cardiac stents and stent in LLE.  He follows up with Dr. Court for it.  He takes aspirin , Plavix  and Praluent for it.  He takes lisinopril  and metoprolol  as well.  He denies any chest pain, dyspnea or palpitations currently.  He is on Imdur  and Ranexa  for angina.  Type II DM: He is on Ozempic 0.5 mg qw currently.  His last HbA1c was 5.6 in 02/25.  He used to take Metaglip , which caused episodes of hypoglycemia since starting Ozempic from [place]. He has stopped taking Metaglip  now.  His blood glucose ranges around 80-120 most of the time now.  He denies any polyuria or polydipsia currently.   Past Medical History:  Diagnosis Date   CHF (congestive heart failure) (HCC)    Coronary artery disease 08/28/2010   s/p multiple caths 2012, BMS PCI OM1 on August 28, 2010-during NSTEMI; January 2019 non-STEMI- occlusion of OM stent (very late stent thrombosis), initial wire crossed with PTCA, but unable to rewire, PCI aborted--> plan medical therapy, normal EF   Diabetes mellitus    GERD (gastroesophageal reflux disease)    Hyperlipidemia    Hypertension    Non-STEMI (non-ST elevated myocardial infarction) [facility]) January 2012 and 2019   a) Jan 2012: 99% OM1 - BMS PCI; b) Jan 2019: Very late stent thrombosis/100% OM1 -after initially causing him for repeat PTCA restoring flow, unable to recross to place stent. - >  Medical therapy.   NSTEMI (non-ST elevated myocardial infarction) (HCC)    Admitted 08/29/17 with NSTEMI-Troponin peak 8.8, normal LVF   PVD  (peripheral vascular disease) (HCC)    left SFA PTA & stenting in 02/2005 (Dr. DOROTHA Court)   Vitamin D  deficiency disease 05/04/2019    Past Surgical History:  Procedure Laterality Date   BIOPSY  08/02/2019   Procedure: BIOPSY;  Surgeon: Shaaron Lamar HERO, MD;  [facility];  Service: Endoscopy;;  gastric    CARDIAC CATHETERIZATION  12/23/2004   normal L main, normal LAD, normal L Cfx, RCA with 20% hypodense lesion in first end of vessel (Dr. DOROTHA Court)   CARDIAC CATHETERIZATION  08/26/2007   no significant CAD by cath, EF 50% (Dr. DOROTHA Schwalbe)   COLONOSCOPY  12/2009   Dr. Yolonda   COLONOSCOPY N/A 01/30/2017   pancolonic diverticulosis, non-bleeding internal hemorrhoids.   COLONOSCOPY WITH PROPOFOL  N/A 09/08/2019   Procedure: COLONOSCOPY WITH PROPOFOL ;  Surgeon: Shaaron Lamar HERO, MD;  [facility];  Service: Endoscopy;  Laterality: N/A;  11:15am   CORONARY BALLOON ANGIOPLASTY N/A 08/30/2017   Procedure: CORONARY BALLOON ANGIOPLASTY;  Surgeon: Court Dorn PARAS, MD;  [facility]: [facility];  Service: Cardiovascular;  100% CTO very late stent thrombosis OM1 -> initially crossed with PTCA, but then unable to recross after losing my positioning.  PTCA ABORTED.  UNSUCCESSFUL ATTEMPT   CORONARY BALLOON ANGIOPLASTY  02/10/2011   95% prox in-stent restenosis within OM stent -  opened with cutting balloon (Dr. IVAR Sor)   CORONARY STENT INTERVENTION  07/17/2011   in-stent restenosis - re-stented with Promus 2.25x33mm DES (Dr. CHARM Clay)   CORONARY STENT INTERVENTION  08/28/2010   NSTEMI: OM1 99% BMS PCI 2.0x8mm MiniVision BMS (Dr. 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 DOROTHA Lesches)   LEFT HEART CATH AND CORONARY ANGIOGRAPHY N/A 08/30/2017   Procedure: LEFT HEART CATH AND CORONARY ANGIOGRAPHY;  Surgeon: Lesches Dorn PARAS, MD;  Location: [facility];  Service: Cardiovascular;  100% very late stent thrombosis of Overlapped BMS-DES OM1 -> attempted PTCA   LEFT HEART CATH AND CORONARY ANGIOGRAPHY  08/28/2010   NSTEMI: OM1 99% BMS PCI  (Dr. DOROTHA Lesches)   LEFT HEART CATH AND CORONARY ANGIOGRAPHY  09/11/2010   patent stent (Dr. CHARM Clay)   LEFT HEART CATH AND CORONARY ANGIOGRAPHY  02/10/2011   95% prox in-stent restenosis within OM stent  (Dr. IVAR Sor)   LEFT HEART CATHETERIZATION WITH CORONARY ANGIOGRAM N/A 07/17/2011   Procedure: LEFT HEART CATHETERIZATION WITH CORONARY ANGIOGRAM;  Surgeon: Alm LELON Clay, MD;  Location: [facility] CATH LAB;  Service: Cardiovascular;  Laterality: N/A;  Right radial approach;  90% ISR of BMS (5 months post PTCA for ISR) --> DES PCI   left knee arthroscopy  05/2016   NM MYOCAR PERF WALL MOTION  09/08/2013   abnormal lexiscan  - low to intermediate risk;    POLYPECTOMY  09/08/2019   Procedure: POLYPECTOMY;  Surgeon: Shaaron Lamar HERO, MD;  [facility];  Service: Endoscopy;;   TOOTH EXTRACTION Right 06/19/2020   TRANSTHORACIC ECHOCARDIOGRAM  09/01/2017   Normal LV size and function.  EF 66 5%.  Normal wall motion.  GR 1 DD.  Mild aortic sclerosis.  Aortic root mildly dilated at 40 mm.    Family History  Problem Relation Age of Onset   Arrhythmia Mother 4   Early death Son    Colon cancer Neg Hx    Liver disease Neg Hx    Inflammatory bowel disease Neg Hx    Colon polyps Neg Hx     Social History   Socioeconomic History   Marital status: Married    Spouse name: Elijah   Number of children: 3   Years of education: 12    Highest education level: Some college, no degree  Occupational History   Occupation: Retired    Associate Professor: [employer]  Tobacco Use   Smoking status: Former    Current packs/day: 0.00    Average packs/day: 1 pack/day for 30.0 years (30.0 ttl pk-yrs)    Types: Cigarettes    Start date: 05/11/1969    Quit date: 05/12/1999    Years since quitting: 24.9    Passive exposure: Past   Smokeless tobacco: Never  Vaping Use   Vaping status: Never Used  Substance and Sexual Activity   Alcohol use: No   Drug use: No   Sexual activity: Yes  Other Topics Concern   Not on file  Social History Narrative   Married for 49  years.Lives with wife.Retired,ex-lab Pensions consultant.Ex-Marine,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   Social Drivers of Health   Financial Resource Strain: Low Risk  (09/15/2022)   Overall Financial Resource Strain (CARDIA)    Difficulty of Paying Living Expenses: Not hard at all  Food Insecurity: No Food Insecurity (05/26/2023)   Hunger Vital Sign    Worried About Running Out of Food in the Last Year: Never true    Ran Out of Food in the Last Year: Never true  Transportation Needs: No Transportation Needs (05/26/2023)   PRAPARE - Administrator, Civil Service (Medical): No    Lack of Transportation (Non-Medical): No  Physical Activity: Insufficiently Active (09/15/2022)   Exercise Vital Sign    Days of Exercise per Week: 3 days    Minutes of Exercise per Session: 30 min  Stress: No Stress Concern Present (09/15/2022)   [employer] of Occupational Health - Occupational Stress Questionnaire    Feeling of Stress : Not at all  Social Connections: Moderately Integrated (09/15/2022)   Social Connection and Isolation Panel    Frequency of Communication with Friends and Family: More than three times a week    Frequency of Social Gatherings with Friends and Family: More than three times a week    Attends Religious Services: More than 4 times per year    Active Member of [employer] or  Organizations: No    Attends Banker Meetings: Never    Marital Status: Married  Catering manager Violence: Not At Risk (05/26/2023)   Humiliation, Afraid, Rape, and Kick questionnaire    Fear of Current or Ex-Partner: No    Emotionally Abused: No    Physically Abused: No    Sexually Abused: No    Outpatient Medications Prior to Visit  Medication Sig Dispense Refill   acetaminophen  (TYLENOL ) 500 MG tablet Take 500 mg by mouth every 6 (six) hours as needed for moderate pain (pain score 4-6).     Alirocumab (PRALUENT) 75 MG/ML SOAJ Inject 1 Dose into the skin every 14 (fourteen) days.     aspirin  81 MG chewable tablet Chew 1 tablet (81 mg total) by mouth daily.     Cholecalciferol (VITAMIN D3) 50 MCG (2000 UT) TABS Take 3 tablets by mouth daily.     clopidogrel  (PLAVIX ) 75 MG tablet TAKE ONE TABLET BY MOUTH ONCE DAILY. 30 tablet 9   cyclobenzaprine  (FLEXERIL ) 5 MG tablet Take 1 tablet (5 mg total) by mouth at bedtime as needed for muscle spasms. 30 tablet 1   diclofenac Sodium (VOLTAREN) 1 % GEL Apply topically as needed.     docusate sodium (COLACE) 100 MG capsule Take 100 mg by mouth every other day.     isosorbide  mononitrate (IMDUR ) 60 MG 24 hr tablet Take 1 tablet (60 mg total) by mouth daily. 90 tablet 3   lisinopril  (PRINIVIL ,ZESTRIL ) 5 MG tablet Take 5 mg by mouth daily.     loratadine  (CLARITIN ) 10 MG tablet Take 1 tablet by mouth daily.     metoprolol  succinate (TOPROL -XL) 25 MG 24 hr tablet TAKE 2 TABLETS BY MOUTH ONCE DAILY. 180 tablet 3   nitroGLYCERIN  (NITROSTAT ) 0.4 MG SL tablet DISSOLVE 1 TABLET UNDER TONGUE EVERY 5 MINUTES UP TO 15 MIN FOR CHEST PAIN. IF NO RELIEF CALL 911. 25 tablet 4   ONETOUCH ULTRA test strip USE AS DIRECTED UP TO 3 TIMES DAILY IF NEEDED. 100 strip 3   pantoprazole  (PROTONIX ) 40 MG tablet Take 40 mg by mouth daily.  ranolazine  (RANEXA ) 500 MG 12 hr tablet Take 500 mg by mouth 2 (two) times daily.     Semaglutide ,0.25 or 0.5MG /DOS,  (OZEMPIC, 0.25 OR 0.5 MG/DOSE,) 2 MG/1.5ML SOPN Inject 0.25 mg into the skin once a week.     sucralfate  (CARAFATE ) 1 g tablet Take 1 tablet (1 g total) by mouth 4 (four) times daily -  with meals and at bedtime. 120 tablet 1   sucralfate  (CARAFATE ) 1 GM/10ML suspension TAKE 10 ML BY MOUTH FOUR TIMES DAILY 420 mL 1   No facility-administered medications prior to visit.    Allergies  Allergen Reactions   Crestor  [Rosuvastatin ]     myalgia   Jardiance  [Empagliflozin ]     Penile swelling and rash   Propoxyphene N-Acetaminophen  Nausea Only   Statins Other (See Comments)    Severe muscle cramping/aching/pain/ elevated CK   Tape     Blisters   Zetia  [Ezetimibe ]     Muscle cramping   Zocor  [Simvastatin ]     myalgia   Flomax [Tamsulosin Hcl] Rash   Flomax [Tamsulosin] Rash   Levaquin [Levofloxacin Hemihydrate] Rash   Levofloxacin Rash   Penicillin G Diarrhea and Rash   Penicillins Rash    Broke out in rash 6 years ago, pt recently took penicillin (09/2016) and had no reaction immediate rash, facial/tongue/throat swelling, SOB or lightheadedness with hypotension    ROS Review of Systems  Constitutional:  Negative for chills and fever.  HENT:  Negative for congestion and sore throat.   Eyes:  Negative for pain and discharge.  Respiratory:  Negative for cough and shortness of breath.   Cardiovascular:  Negative for chest pain and palpitations.  Gastrointestinal:  Negative for diarrhea, nausea and vomiting.  Endocrine: Negative for polydipsia and polyuria.  Genitourinary:  Negative for dysuria and hematuria.       Nocturia  Musculoskeletal:  Positive for back pain. Negative for neck pain and neck stiffness.  Skin:  Negative for rash.  Neurological:  Negative for dizziness, weakness, numbness and headaches.  Psychiatric/Behavioral:  Negative for agitation and behavioral problems.       Objective:    Physical Exam Vitals reviewed.  Constitutional:      General: He is not in  acute distress.    Appearance: He is not diaphoretic.  HENT:     Head: Normocephalic and atraumatic.     Nose: Nose normal.     Mouth/Throat:     Mouth: Mucous membranes are moist.  Eyes:     General: No scleral icterus.    Extraocular Movements: Extraocular movements intact.  Cardiovascular:     Rate and Rhythm: Normal rate and regular rhythm.     Pulses: Normal pulses.     Heart sounds: Normal heart sounds. No murmur heard. Pulmonary:     Breath sounds: Normal breath sounds. No wheezing or rales.  Abdominal:     Palpations: Abdomen is soft.     Tenderness: There is no abdominal tenderness.  Musculoskeletal:     Cervical back: Neck supple. No tenderness.     Lumbar back: Tenderness (Right paraspinal) present. Negative right straight leg raise test and negative left straight leg raise test.     Right lower leg: No edema.     Left lower leg: No edema.  Skin:    General: Skin is warm.     Findings: No rash.  Neurological:     General: No focal deficit present.     Mental Status: He is alert and oriented  to person, place, and time.     Cranial Nerves: No cranial nerve deficit.     Sensory: No sensory deficit.     Motor: No weakness.  Psychiatric:        Mood and Affect: Mood normal.        Behavior: Behavior normal.     BP 111/68   Pulse 73   Ht 6' (1.829 m)   Wt 196 lb 3.2 oz (89 kg)   SpO2 96%   BMI 26.61 kg/m  Wt Readings from Last 3 Encounters:  04/19/24 196 lb 3.2 oz (89 kg)  11/11/23 200 lb 12.8 oz (91.1 kg)  09/14/23 200 lb 1 oz (90.7 kg)    Lab Results  Component Value Date   TSH 0.998 09/14/2023   Lab Results  Component Value Date   WBC 5.4 09/14/2023   HGB 12.5 (L) 09/14/2023   HCT 37.7 09/14/2023   MCV 100 (H) 09/14/2023   PLT 184 09/14/2023   Lab Results  Component Value Date   NA 142 09/14/2023   K 4.2 09/14/2023   CO2 23 09/14/2023   GLUCOSE 90 09/14/2023   BUN 15 09/14/2023   CREATININE 1.19 09/14/2023   BILITOT 0.5 09/14/2023    ALKPHOS 48 09/14/2023   AST 18 09/14/2023   ALT 12 09/14/2023   PROT 7.0 09/14/2023   ALBUMIN 4.2 09/14/2023   CALCIUM  9.8 09/14/2023   ANIONGAP 7 05/26/2023   EGFR 63 09/14/2023   Lab Results  Component Value Date   CHOL 125 09/14/2023   Lab Results  Component Value Date   HDL 41 09/14/2023   Lab Results  Component Value Date   LDLCALC 59 09/14/2023   Lab Results  Component Value Date   TRIG 147 09/14/2023   Lab Results  Component Value Date   CHOLHDL 3.0 09/14/2023   Lab Results  Component Value Date   HGBA1C 5.6 09/14/2023      Assessment & Plan:   Problem List Items Addressed This Visit       Cardiovascular and Mediastinum   Peripheral vascular disease (HCC) (Chronic)   Has had stent placed in LLE Follows up with interventional cardiology On Plavix  and Praluent      Essential hypertension - Primary (Chronic)   BP Readings from Last 1 Encounters:  04/19/24 111/68   Well-controlled with lisinopril  5 mg QD and metoprolol  50 mg QD Counseled for compliance with the medications Advised DASH diet and moderate exercise/walking      Atypical angina (HCC)   On Ranexa  and Imdur  - denies chest pain episodes now Has Nitroglycerin  PRN Followed by Cardiology        Endocrine   DM (diabetes mellitus), type 2 with complications (HCC)   Lab Results  Component Value Date   HGBA1C 5.6 09/14/2023   Well-controlled Associated with HTN, HLD, CAD and PAD Now on Ozempic 0.5 mg qw through [place] [facility] DCed glipizide -metformin  5-500 mg QD due to hypoglycemia in the last visit Had given Jardiance  for additional cardiac benefit, but had rash and penile swelling Advised to follow diabetic diet On statin and ACEi F/u CMP and HbA1C Diabetic eye exam: Advised to follow up with Ophthalmology for diabetic eye exam      Relevant Orders   CMP14+EGFR   Hemoglobin A1c   Urine Microalbumin w/creat. ratio     Genitourinary   Stage 3a chronic kidney disease (HCC)   Last  BMP showed GFR of 63, was around 55 previously Maintain adequate  hydration On ACEi Avoid nephrotoxic agents Check CMP, urine microalbumin/creatinine ratio        Other   Hyperlipidemia with target LDL less than 70 (Chronic)   Lipid profile reviewed from [place] chart On Praluent as he did not tolerate statin in the past      Benign prostatic hyperplasia with urinary frequency   Likely has BPH, has urinary frequency -had rash with Flomax and Rapaflo Followed by Urology      Other Visit Diagnoses       Encounter for immunization       Relevant Orders   Flu vaccine HIGH DOSE PF(Fluzone Trivalent) (Completed)        No orders of the defined types were placed in this encounter.    Follow-up: Return in about 5 months (around 09/19/2024) for HTN and DM.    Suzzane MARLA Blanch, MD

## 2024-04-19 NOTE — Assessment & Plan Note (Addendum)
 Lab Results  Component Value Date   HGBA1C 5.6 09/14/2023   Well-controlled Associated with HTN, HLD, CAD and PAD Now on Ozempic 0.5 mg qw through [place] clinic DCed glipizide -metformin  5-500 mg QD due to hypoglycemia in the last visit Had given Jardiance  for additional cardiac benefit, but had rash and penile swelling Advised to follow diabetic diet On statin and ACEi F/u CMP and HbA1C Diabetic eye exam: Advised to follow up with Ophthalmology for diabetic eye exam

## 2024-04-19 NOTE — Patient Instructions (Signed)
 Please continue to take medications as prescribed.  Please continue to follow low carb diet and perform moderate exercise/walking as tolerated.

## 2024-04-19 NOTE — Assessment & Plan Note (Signed)
 Likely has BPH, has urinary frequency -had rash with Flomax and Rapaflo Followed by Urology

## 2024-04-20 ENCOUNTER — Ambulatory Visit: Payer: Self-pay | Admitting: Internal Medicine

## 2024-04-20 LAB — CMP14+EGFR
ALT: 18 IU/L (ref 0–44)
AST: 20 IU/L (ref 0–40)
Albumin: 4.1 g/dL (ref 3.8–4.8)
Alkaline Phosphatase: 51 IU/L (ref 44–121)
BUN/Creatinine Ratio: 11 (ref 10–24)
BUN: 14 mg/dL (ref 8–27)
Bilirubin Total: 1 mg/dL (ref 0.0–1.2)
CO2: 25 mmol/L (ref 20–29)
Calcium: 9.9 mg/dL (ref 8.6–10.2)
Chloride: 103 mmol/L (ref 96–106)
Creatinine, Ser: 1.23 mg/dL (ref 0.76–1.27)
Globulin, Total: 2.7 g/dL (ref 1.5–4.5)
Glucose: 81 mg/dL (ref 70–99)
Potassium: 4.2 mmol/L (ref 3.5–5.2)
Sodium: 139 mmol/L (ref 134–144)
Total Protein: 6.8 g/dL (ref 6.0–8.5)
eGFR: 61 mL/min/1.73 (ref >59)

## 2024-04-20 LAB — HEMOGLOBIN A1C
Est. average glucose Bld gHb Est-mCnc: 114 mg/dL
Hgb A1c MFr Bld: 5.6 % (ref 4.8–5.6)

## 2024-04-21 LAB — MICROALBUMIN / CREATININE URINE RATIO
Creatinine, Urine: 129.5 mg/dL
Microalb/Creat Ratio: 3 mg/g{creat} (ref 0–29)
Microalbumin, Urine: 3.3 ug/mL

## 2024-04-25 ENCOUNTER — Ambulatory Visit
Admission: EM | Admit: 2024-04-25 | Discharge: 2024-04-25 | Disposition: A | Discharge status: Home / Self Care | Attending: Family Medicine | Admitting: Family Medicine

## 2024-04-25 ENCOUNTER — Other Ambulatory Visit: Payer: Self-pay

## 2024-04-25 DIAGNOSIS — U071 COVID-19: Secondary | ICD-10-CM

## 2024-04-25 LAB — POC SOFIA SARS ANTIGEN FIA: SARS Coronavirus 2 Ag: POSITIVE — AB

## 2024-04-25 MED ORDER — MOLNUPIRAVIR EUA 200MG CAPSULE: 4.0000 | ORAL_CAPSULE | Freq: Two times a day (BID) | ORAL | 0 refills | Status: AC

## 2024-04-25 MED ORDER — BENZONATATE 200 MG PO CAPS: 200.0000 mg | ORAL_CAPSULE | Freq: Three times a day (TID) | ORAL | 0 refills | Status: AC | PRN

## 2024-04-25 MED ORDER — AZELASTINE HCL 0.1 % NA SOLN: 1.0000 | Freq: Two times a day (BID) | NASAL | 0 refills | Status: AC

## 2024-04-25 NOTE — Discharge Instructions (Signed)
 In addition to the prescribed medications, you may use Coricidin HBP, plain Mucinex , Flonase, saline sinus rinses.  Follow-up for worsening or unresolving symptoms

## 2024-04-25 NOTE — ED Triage Notes (Signed)
 Pt being seen in UC for sinus congestion and drainage, scratchy throat, body aches, cough, some SOB, and headache that began on Saturday. Pt denies n/v/d, fever, and CP. Pt able to speak in complete sentences in triage. Pt denies tylenol  and motrin , but reports taking nyquil last night.

## 2024-04-25 NOTE — ED Provider Notes (Signed)
 [facility]    CSN: [id number] Arrival date & time: 04/25/24  0810      History   Chief Complaint Chief Complaint  Patient presents with   Nasal Congestion   Headache    HPI [name] is a 78 y.o. male.   Patient presenting today with 2-day history of congestion, sinus drainage, scratchy throat, body aches, cough, headache, body aches, fatigue.  Denies chest pain, shortness of breath, abdominal pain, vomiting, diarrhea.  So far trying DayQuil last night with minimal relief, otherwise not try anything over-the-counter for symptoms.  No known history of chronic pulmonary disease but does have multiple heart conditions.  On Ranexa  for anticoagulation.    Past Medical History:  Diagnosis Date   CHF (congestive heart failure) (HCC)    Coronary artery disease 08/28/2010   s/p multiple caths 2012, BMS PCI OM1 on August 28, 2010-during NSTEMI; January 2019 non-STEMI- occlusion of OM stent (very late stent thrombosis), initial wire crossed with PTCA, but unable to rewire, PCI aborted--> plan medical therapy, normal EF   Diabetes mellitus    GERD (gastroesophageal reflux disease)    Hyperlipidemia    Hypertension    Non-STEMI (non-ST elevated myocardial infarction) [facility]) January 2012 and 2019   a) Jan 2012: 99% OM1 - BMS PCI; b) Jan 2019: Very late stent thrombosis/100% OM1 -after initially causing him for repeat PTCA restoring flow, unable to recross to place stent. - >  Medical therapy.   NSTEMI (non-ST elevated myocardial infarction) (HCC)    Admitted 08/29/17 with NSTEMI-Troponin peak 8.8, normal LVF   PVD (peripheral vascular disease) (HCC)    left SFA PTA & stenting in 02/2005 (Dr. 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   Constipation 08/24/2019   IDA (iron deficiency anemia) 05/26/2019   Vitamin D  deficiency disease 05/04/2019   GERD (gastroesophageal reflux disease) 01/09/2017   Hepatomegaly 11/08/2014   Essential hypertension 09/07/2013   CAD S/P percutaneous coronary angioplasty 07/16/2011   DM (diabetes mellitus), 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 DOROTHA Lesches)   CARDIAC CATHETERIZATION  08/26/2007   no significant CAD by cath, EF 50% (Dr. DOROTHA Schwalbe)   COLONOSCOPY  12/2009   Dr. Yolonda   COLONOSCOPY N/A 01/30/2017   pancolonic diverticulosis, non-bleeding internal hemorrhoids.   COLONOSCOPY WITH PROPOFOL  N/A 09/08/2019   Procedure: COLONOSCOPY WITH PROPOFOL ;  Surgeon: Shaaron Lamar HERO, MD;  [facility]: [facility];  Service: Endoscopy;  Laterality: N/A;  11:15am   CORONARY BALLOON ANGIOPLASTY N/A 08/30/2017  Procedure: CORONARY BALLOON ANGIOPLASTY;  Surgeon: Court Dorn PARAS, MD;  Location: [facility];  Service: Cardiovascular;  100% CTO very late stent thrombosis OM1 -> initially crossed with PTCA, but then unable to recross after losing my positioning.  PTCA ABORTED.  UNSUCCESSFUL ATTEMPT   CORONARY BALLOON ANGIOPLASTY  02/10/2011   95% prox  in-stent restenosis within OM stent - opened with cutting balloon (Dr. IVAR Sor)   CORONARY STENT INTERVENTION  07/17/2011   in-stent restenosis - re-stented with Promus 2.25x38mm DES (Dr. CHARM Clay)   CORONARY STENT INTERVENTION  08/28/2010   NSTEMI: OM1 99% BMS PCI 2.0x66mm MiniVision BMS (Dr. 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 DOROTHA Court)   LEFT HEART CATH AND CORONARY ANGIOGRAPHY N/A 08/30/2017   Procedure: LEFT HEART CATH AND CORONARY ANGIOGRAPHY;  Surgeon: Court Dorn PARAS, MD;  Location: [facility];  Service: Cardiovascular;  100% very late stent thrombosis of Overlapped BMS-DES OM1 -> attempted PTCA   LEFT HEART CATH AND CORONARY ANGIOGRAPHY  08/28/2010   NSTEMI: OM1 99% BMS PCI  (Dr. DOROTHA Court)   LEFT HEART CATH AND CORONARY ANGIOGRAPHY  09/11/2010   patent stent (Dr. CHARM Clay)   LEFT HEART CATH AND CORONARY ANGIOGRAPHY  02/10/2011   95% prox in-stent restenosis within OM stent  (Dr. IVAR Sor)   LEFT HEART CATHETERIZATION WITH CORONARY ANGIOGRAM N/A 07/17/2011   Procedure: LEFT HEART CATHETERIZATION WITH CORONARY ANGIOGRAM;  Surgeon: Alm LELON Clay, MD;  Location: [facility] C[facility];  Service: Cardiovascular;  Laterality: N/A;  Right radial approach;  90% ISR of BMS (5 months post PTCA for ISR) --> DES PCI   left knee arthroscopy  05/2016   NM MYOCAR PERF WALL MOTION  09/08/2013   abnormal lexiscan  - low to intermediate risk;    POLYPECTOMY  09/08/2019   Procedure: POLYPECTOMY;   Surgeon: Shaaron Lamar HERO, MD;  [facility]: [facility];  Service: Endoscopy;;   TOOTH EXTRACTION Right 06/19/2020   TRANSTHORACIC ECHOCARDIOGRAM  09/01/2017   Normal LV size and function.  EF 66 5%.  Normal wall motion.  GR 1 DD.  Mild aortic sclerosis.  Aortic root mildly dilated at 40 mm.       Home Medications    Prior to Admission medications   Medication Sig Start Date End Date Taking? Authorizing Provider  azelastine  (ASTELIN ) 0.1 % nasal spray Place 1 spray into both nostrils 2 (two) times daily. Use in each nostril as directed 04/25/24  Yes Stuart Vernell Norris, PA-C  benzonatate  (TESSALON ) 200 MG capsule Take 1 capsule (200 mg total) by mouth 3 (three) times daily as needed for cough. 04/25/24  Yes Stuart Vernell Norris, PA-C  molnupiravir  EUA (LAGEVRIO ) 200 mg CAPS capsule Take 4 capsules (800 mg total) by mouth 2 (two) times daily for 5 days. 04/25/24 04/30/24 Yes Stuart Vernell Norris, PA-C  acetaminophen  (TYLENOL ) 500 MG tablet Take 500 mg by mouth every 6 (six) hours as needed for moderate pain (pain score 4-6).    [provider]  Alirocumab (PRALUENT) 75 MG/ML SOAJ  Inject 1 Dose into the skin every 14 (fourteen) days.    [provider]  aspirin  81 MG chewable tablet Chew 1 tablet (81 mg total) by mouth daily. Patient not taking: Reported on 04/25/2024 09/03/17   Henry Shaver B, NP  Cholecalciferol (VITAMIN D3) 50 MCG (2000 UT) TABS Take 3 tablets by mouth daily.    [provider]  clopidogrel  (PLAVIX ) 75 MG tablet TAKE ONE TABLET BY MOUTH ONCE DAILY. 12/07/15   Court Dorn PARAS, MD  cyclobenzaprine  (FLEXERIL ) 5 MG tablet Take 1 tablet (5 mg total) by mouth at bedtime as needed for muscle spasms. 01/26/24   Patel, Rutwik K, MD  diclofenac Sodium (VOLTAREN) 1 % GEL Apply topically as needed. 09/07/20   [provider]  docusate sodium (COLACE) 100 MG capsule Take 100 mg by mouth every other day.    [provider]  isosorbide   mononitrate (IMDUR ) 60 MG 24 hr tablet Take 1 tablet (60 mg total) by mouth daily. 06/21/19   Court Dorn PARAS, MD  lisinopril  (PRINIVIL ,ZESTRIL ) 5 MG tablet Take 5 mg by mouth daily.    [provider]  loratadine  (CLARITIN ) 10 MG tablet Take 1 tablet by mouth daily. 05/29/23   [provider]  metoprolol  succinate (TOPROL -XL) 25 MG 24 hr tablet TAKE 2 TABLETS BY MOUTH ONCE DAILY. 11/20/23   Court Dorn PARAS, MD  nitroGLYCERIN  (NITROSTAT ) 0.4 MG SL tablet DISSOLVE 1 TABLET UNDER TONGUE EVERY 5 MINUTES UP TO 15 MIN FOR CHEST PAIN. IF NO RELIEF CALL 911. 10/27/22   Court Dorn PARAS, MD  [facility] ULTRA test strip USE AS DIRECTED UP TO 3 TIMES DAILY IF NEEDED. 03/28/20   Caswell Flank C, MD  pantoprazole  (PROTONIX ) 40 MG tablet Take 40 mg by mouth daily.    [provider]  ranolazine  (RANEXA ) 500 MG 12 hr tablet Take 500 mg by mouth 2 (two) times daily.    [provider]  Semaglutide ,0.25 or 0.5MG /DOS, (OZEMPIC, 0.25 OR 0.5 MG/DOSE,) 2 MG/1.5ML SOPN Inject 0.25 mg into the skin once a week.    [provider]  sucralfate  (CARAFATE ) 1 g tablet Take 1 tablet (1 g total) by mouth 4 (four) times daily -  with meals and at bedtime. 11/03/23   Shirlean Therisa ORN, NP  sucralfate  (CARAFATE ) 1 GM/10ML suspension TAKE 10 ML BY MOUTH FOUR TIMES DAILY 12/03/23   Shirlean Therisa ORN, NP    Family History Family History  Problem Relation Age of Onset   Arrhythmia Mother 45   Early death Son    Colon cancer Neg Hx    Liver disease Neg Hx    Inflammatory bowel disease Neg Hx    Colon polyps Neg Hx     Social History Social History   Tobacco Use   Smoking status: Former    Current packs/day: 0.00    Average packs/day: 1 pack/day for 30.0 years (30.0 ttl pk-yrs)    Types: Cigarettes    Start date: 05/11/1969    Quit date: 05/12/1999    Years since quitting: 24.9    Passive exposure: Past   Smokeless tobacco: Never  Vaping Use   Vaping status: Never Used  Substance  Use Topics   Alcohol use: No   Drug use: No     Allergies   Crestor  [rosuvastatin ], Jardiance  [empagliflozin ], Propoxyphene n-acetaminophen , Statins, Tape, Zetia  [ezetimibe ], Zocor  [simvastatin ], Flomax [tamsulosin hcl], Flomax [tamsulosin], Levaquin [levofloxacin hemihydrate], Levofloxacin, Penicillin g, and Penicillins   Review of Systems Review of Systems  Per HPI  Physical Exam Triage Vital Signs ED Triage Vitals  Encounter Vitals Group     BP 04/25/24 0828 111/65     Girls Systolic BP Percentile --      Girls Diastolic BP Percentile --      Boys Systolic BP Percentile --      Boys Diastolic BP Percentile --      Pulse Rate 04/25/24 0828 76     Resp 04/25/24 0828 19     Temp 04/25/24 0828 98.3 F (36.8 C)     Temp Source 04/25/24 0828 Oral     SpO2 04/25/24 0828 95 %     Weight --      Height --      Head Circumference --      Peak Flow --      Pain Score 04/25/24 0829 4     Pain Loc --      Pain Education --      Exclude from Growth Chart --    No data found.  Updated Vital Signs BP 111/65 (BP Location: Right Arm)   Pulse 76   Temp 98.3 F (36.8 C) (Oral)   Resp 19   SpO2 95%   Visual Acuity Right Eye Distance:   Left Eye Distance:   Bilateral Distance:    Right Eye Near:   Left Eye Near:    Bilateral Near:     Physical Exam Vitals and nursing note reviewed.  Constitutional:      Appearance: He is well-developed.  HENT:     Head: Atraumatic.     Right Ear: External ear normal.     Left Ear: External ear normal.     Nose: Rhinorrhea present.     Mouth/Throat:     Pharynx: Posterior oropharyngeal erythema present. No oropharyngeal exudate.  Eyes:     Conjunctiva/sclera: Conjunctivae normal.     Pupils: Pupils are equal, round, and reactive to light.  Cardiovascular:     Rate and Rhythm: Normal rate.  Pulmonary:     Effort: Pulmonary effort is normal. No respiratory distress.     Breath sounds: No wheezing or rales.  Musculoskeletal:         General: Normal range of motion.     Cervical back: Normal range of motion and neck supple.  Lymphadenopathy:     Cervical: No cervical adenopathy.  Skin:    General: Skin is warm and dry.  Neurological:     Mental Status: He is alert and oriented to person, place, and time.  Psychiatric:        Behavior: Behavior normal.      UC Treatments / Results  Labs (all labs ordered are listed, but only abnormal results are displayed) Labs Reviewed  POC SOFIA SARS ANTIGEN FIA - Abnormal; Notable for the following components:      Result Value   SARS Coronavirus 2 Ag Positive (*)    All other components within normal limits    EKG   Radiology No results found.  Procedures Procedures (including critical care time)  Medications Ordered in UC Medications - No data to display  Initial Impression / Assessment and Plan / UC Course  I have reviewed the triage vital signs and the nursing notes.  Pertinent labs & imaging results that were available during my care of the patient were reviewed by me and considered in my medical decision making (see chart for details).     Vital signs and exam overall very  reassuring, consistent with rapid COVID-positive test result.  Will treat with molnupiravir , Tessalon , Astelin , supportive over-the-counter medications at home care.  Return for worsening symptoms.  Final Clinical Impressions(s) / UC Diagnoses   Final diagnoses:  COVID-19     Discharge Instructions      In addition to the prescribed medications, you may use Coricidin HBP, plain Mucinex , Flonase, saline sinus rinses.  Follow-up for worsening or unresolving symptoms    ED Prescriptions     Medication Sig Dispense Auth. Provider   molnupiravir  EUA (LAGEVRIO ) 200 mg CAPS capsule Take 4 capsules (800 mg total) by mouth 2 (two) times daily for 5 days. 40 capsule Stuart Vernell Norris, PA-C   benzonatate  (TESSALON ) 200 MG capsule Take 1 capsule (200 mg total) by mouth 3 (three)  times daily as needed for cough. 20 capsule Stuart Vernell Norris, PA-C   azelastine  (ASTELIN ) 0.1 % nasal spray Place 1 spray into both nostrils 2 (two) times daily. Use in each nostril as directed 30 mL Stuart Vernell Norris, PA-C      PDMP not reviewed this encounter.   Stuart Vernell [place], [place] 04/25/24 [phone]

## 2024-05-04 ENCOUNTER — Ambulatory Visit: Payer: Self-pay

## 2024-05-04 NOTE — Telephone Encounter (Signed)
  Reason for Disposition  Health information question, no triage required and triager able to answer question  Answer Assessment - Initial Assessment Questions 1. REASON FOR CALL: What is the main reason for your call? or How can I best help you?  Patient tested positive for covid on 04/25/24. States he feels great but is still testing positive and wants to information about isolation requirements.  This RN reviewed current CDC recommendations including:  -You may end isolation once symptoms are improving and you are fever free for 24 hours without the use of ibuprofen  or tylenol  -Wear a mask around others or in public for an additional 5 days.  This RN also recommended additional masking or distancing if around anyone immune compromised, pregnant, babies and the elderly until fully symptom free.  Patient advised to call if he experiences a return of symptoms. Pt verbalized understanding.  Protocols used: Information Only Call - No Triage-[facility]

## 2024-05-16 ENCOUNTER — Other Ambulatory Visit: Payer: Self-pay

## 2024-05-16 DIAGNOSIS — Z955 Presence of coronary angioplasty implant and graft: Secondary | ICD-10-CM

## 2024-05-16 MED ORDER — NITROGLYCERIN 0.4 MG SL SUBL: SUBLINGUAL_TABLET | SUBLINGUAL | 4 refills | Status: AC

## 2024-05-18 ENCOUNTER — Encounter: Payer: Self-pay | Admitting: Gastroenterology

## 2024-06-09 ENCOUNTER — Ambulatory Visit

## 2024-06-24 DIAGNOSIS — H2513 Age-related nuclear cataract, bilateral: Secondary | ICD-10-CM | POA: Diagnosis not present

## 2024-06-24 DIAGNOSIS — H40013 Open angle with borderline findings, low risk, bilateral: Secondary | ICD-10-CM | POA: Diagnosis not present

## 2024-06-24 DIAGNOSIS — E119 Type 2 diabetes mellitus without complications: Secondary | ICD-10-CM | POA: Diagnosis not present

## 2024-07-21 ENCOUNTER — Telehealth: Payer: Self-pay

## 2024-07-21 NOTE — Telephone Encounter (Signed)
 Copied from CRM #[id number]. Topic: General - Call Back - No Documentation >> Jul 21, 2024  1:43 PM Lauren C wrote: Reason for CRM: Pt says he is returning a call to [facility] for an annual wellness visit, no AWV seen scheduled for today. Please return call at [phone]

## 2024-08-31 ENCOUNTER — Ambulatory Visit: Payer: Self-pay

## 2024-08-31 VITALS — BP 113/64 | Ht 72.0 in | Wt 192.0 lb

## 2024-08-31 DIAGNOSIS — Z0000 Encounter for general adult medical examination without abnormal findings: Secondary | ICD-10-CM

## 2024-08-31 NOTE — Progress Notes (Signed)
 "  Chief Complaint  Patient presents with   Medicare Wellness     Subjective:   [name] is a 79 y.o. male who presents for a Medicare Annual Wellness Visit.  Visit info / Clinical Intake: Medicare Wellness Visit Type:: Subsequent Annual Wellness Visit Persons participating in visit and providing information:: patient Medicare Wellness Visit Mode:: Telephone If telephone:: video declined Since this visit was completed virtually, some vitals may be partially provided or unavailable. Missing vitals are due to the limitations of the virtual format.: Documented vitals are patient reported If Telephone or Video please confirm:: I connected with patient using audio/video enable telemedicine. I verified patient identity with two identifiers, discussed telehealth limitations, and patient agreed to proceed. Patient Location:: home Provider Location:: office Interpreter Needed?: No Pre-visit prep was completed: yes AWV questionnaire completed by patient prior to visit?: no Living arrangements:: lives with spouse/significant other Patient's Overall Health Status Rating: very good Typical amount of pain: none Does pain affect daily life?: no Are you currently prescribed opioids?: no  Dietary Habits and Nutritional Risks How many meals a day?: (!) 1 Eats fruit and vegetables daily?: yes Most meals are obtained by: preparing own meals In the last 2 weeks, have you had any of the following?: none Diabetic:: (!) yes Any non-healing wounds?: no How often do you check your BS?: 2 Would you like to be referred to a Nutritionist or for Diabetic Management? : no  Functional Status Activities of Daily Living (to include ambulation/medication): Independent Ambulation: Independent Medication Administration: Independent Home Management (perform basic housework or laundry): Independent Manage your own finances?: yes Primary transportation is: driving Concerns about vision?: no *vision  screening is required for WTM* Concerns about hearing?: no  Fall Screening Falls in the past year?: 0 Number of falls in past year: 0 Was there an injury with Fall?: 0 Fall Risk Category Calculator: 0 Patient Fall Risk Level: Low Fall Risk  Fall Risk Patient at Risk for Falls Due to: No Fall Risks Fall risk Follow up: Falls evaluation completed; Education provided; Falls prevention discussed  Home and Transportation Safety: All rugs have non-skid backing?: yes All stairs or steps have railings?: yes Grab bars in the bathtub or shower?: (!) no Have non-skid surface in bathtub or shower?: yes Good home lighting?: yes Regular seat belt use?: yes Hospital stays in the last year:: no  Cognitive Assessment Difficulty concentrating, remembering, or making decisions? : no Will 6CIT or Mini Cog be Completed: yes What year is it?: 0 points What month is it?: 0 points Give patient an address phrase to remember (5 components): [address] [place] About what time is it?: 0 points Count backwards from 20 to 1: 0 points Say the months of the year in reverse: 0 points Repeat the address phrase from earlier: 0 points 6 CIT Score: 0 points  Advance Directives (For Healthcare) Does Patient Have a Medical Advance Directive?: No Would patient like information on creating a medical advance directive?: Yes (MAU/Ambulatory/Procedural Areas - Information given)  Reviewed/Updated  Reviewed/Updated: Reviewed All (Medical, Surgical, Family, Medications, Allergies, Care Teams, Patient Goals)    Allergies (verified) Crestor  [rosuvastatin ], Jardiance  [empagliflozin ], Propoxyphene n-acetaminophen , Statins, Tape, Zetia  [ezetimibe ], Zocor  [simvastatin ], Flomax [tamsulosin hcl], Flomax [tamsulosin], Levaquin [levofloxacin hemihydrate], Levofloxacin, Penicillin g, and Penicillins   Current Medications (verified) Outpatient Encounter Medications as of 08/31/2024  Medication Sig   acetaminophen   (TYLENOL ) 500 MG tablet Take 500 mg by mouth every 6 (six) hours as needed for moderate pain (  pain score 4-6).   Alirocumab (PRALUENT) 75 MG/ML SOAJ Inject 1 Dose into the skin every 14 (fourteen) days.   aspirin  81 MG chewable tablet Chew 1 tablet (81 mg total) by mouth daily.   azelastine  (ASTELIN ) 0.1 % nasal spray Place 1 spray into both nostrils 2 (two) times daily. Use in each nostril as directed   benzonatate  (TESSALON ) 200 MG capsule Take 1 capsule (200 mg total) by mouth 3 (three) times daily as needed for cough.   Cholecalciferol (VITAMIN D3) 50 MCG (2000 UT) TABS Take 3 tablets by mouth daily.   clopidogrel  (PLAVIX ) 75 MG tablet TAKE ONE TABLET BY MOUTH ONCE DAILY.   cyclobenzaprine  (FLEXERIL ) 5 MG tablet Take 1 tablet (5 mg total) by mouth at bedtime as needed for muscle spasms.   diclofenac Sodium (VOLTAREN) 1 % GEL Apply topically as needed.   docusate sodium (COLACE) 100 MG capsule Take 100 mg by mouth every other day.   isosorbide  mononitrate (IMDUR ) 60 MG 24 hr tablet Take 1 tablet (60 mg total) by mouth daily.   lisinopril  (PRINIVIL ,ZESTRIL ) 5 MG tablet Take 5 mg by mouth daily.   loratadine  (CLARITIN ) 10 MG tablet Take 1 tablet by mouth daily.   metoprolol  succinate (TOPROL -XL) 25 MG 24 hr tablet TAKE 2 TABLETS BY MOUTH ONCE DAILY.   nitroGLYCERIN  (NITROSTAT ) 0.4 MG SL tablet DISSOLVE 1 TABLET UNDER TONGUE EVERY 5 MINUTES UP TO 15 MIN FOR CHEST PAIN. IF NO RELIEF CALL 911.   ONETOUCH ULTRA test strip USE AS DIRECTED UP TO 3 TIMES DAILY IF NEEDED.   pantoprazole  (PROTONIX ) 40 MG tablet Take 40 mg by mouth daily.   ranolazine  (RANEXA ) 500 MG 12 hr tablet Take 500 mg by mouth 2 (two) times daily.   Semaglutide ,0.25 or 0.5MG /DOS, (OZEMPIC, 0.25 OR 0.5 MG/DOSE,) 2 MG/1.5ML SOPN Inject 0.25 mg into the skin once a week.   sucralfate  (CARAFATE ) 1 g tablet Take 1 tablet (1 g total) by mouth 4 (four) times daily -  with meals and at bedtime.   sucralfate  (CARAFATE ) 1 GM/10ML suspension  TAKE 10 ML BY MOUTH FOUR TIMES DAILY   No facility-administered encounter medications on file as of 08/31/2024.    History: Past Medical History:  Diagnosis Date   CHF (congestive heart failure) (HCC)    Coronary artery disease 08/28/2010   s/p multiple caths 2012, BMS PCI OM1 on August 28, 2010-during NSTEMI; January 2019 non-STEMI- occlusion of OM stent (very late stent thrombosis), initial wire crossed with PTCA, but unable to rewire, PCI aborted--> plan medical therapy, normal EF   Diabetes mellitus    GERD (gastroesophageal reflux disease)    Hyperlipidemia    Hypertension    Non-STEMI (non-ST elevated myocardial infarction) [facility]) January 2012 and 2019   a) Jan 2012: 99% OM1 - BMS PCI; b) Jan 2019: Very late stent thrombosis/100% OM1 -after initially causing him for repeat PTCA restoring flow, unable to recross to place stent. - >  Medical therapy.   NSTEMI (non-ST elevated myocardial infarction) (HCC)    Admitted 08/29/17 with NSTEMI-Troponin peak 8.8, normal LVF   PVD (peripheral vascular disease)    left SFA PTA & stenting in 02/2005 (Dr. DOROTHA Lesches)   Vitamin D  deficiency disease 05/04/2019   Past Surgical History:  Procedure Laterality Date   BIOPSY  08/02/2019   Procedure: BIOPSY;  Surgeon: Shaaron Lamar HERO, MD;  [facility];  Service: Endoscopy;;  gastric    CARDIAC CATHETERIZATION  12/23/2004   normal L  main, normal LAD, normal L Cfx, RCA with 20% hypodense lesion in first end of vessel (Dr. DOROTHA Lesches)   CARDIAC CATHETERIZATION  08/26/2007   no significant CAD by cath, EF 50% (Dr. DOROTHA Schwalbe)   COLONOSCOPY  12/2009   Dr. Yolonda   COLONOSCOPY N/A 01/30/2017   pancolonic diverticulosis, non-bleeding internal hemorrhoids.   COLONOSCOPY WITH PROPOFOL  N/A 09/08/2019   Procedure: COLONOSCOPY WITH PROPOFOL ;  Surgeon: Shaaron Lamar HERO, MD;  [facility];  Service: Endoscopy;  Laterality: N/A;  11:15am   CORONARY BALLOON ANGIOPLASTY N/A 08/30/2017    Procedure: CORONARY BALLOON ANGIOPLASTY;  Surgeon: Lesches Dorn PARAS, MD;  [facility]: [facility];  Service: Cardiovascular;  100% CTO very late stent thrombosis OM1 -> initially crossed with PTCA, but then unable to recross after losing my positioning.  PTCA ABORTED.  UNSUCCESSFUL ATTEMPT   CORONARY BALLOON ANGIOPLASTY  02/10/2011   95% prox in-stent restenosis within OM stent - opened with cutting balloon (Dr. IVAR Sor)   CORONARY STENT INTERVENTION  07/17/2011   in-stent restenosis - re-stented with Promus 2.25x70mm DES (Dr. CHARM Clay)   CORONARY STENT INTERVENTION  08/28/2010   NSTEMI: OM1 99% BMS PCI 2.0x5mm MiniVision BMS (Dr. 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 DOROTHA Lesches)   LEFT HEART CATH AND CORONARY ANGIOGRAPHY N/A 08/30/2017   Procedure: LEFT HEART CATH AND CORONARY ANGIOGRAPHY;  Surgeon: Lesches Dorn PARAS, MD;  Location: [facility];  Service: Cardiovascular;  100% very late stent thrombosis of Overlapped BMS-DES OM1 -> attempted PTCA   LEFT HEART CATH AND CORONARY ANGIOGRAPHY  08/28/2010   NSTEMI: OM1 99% BMS PCI  (Dr. DOROTHA Lesches)   LEFT HEART CATH AND CORONARY ANGIOGRAPHY  09/11/2010   patent stent (Dr. CHARM Clay)   LEFT HEART CATH AND CORONARY ANGIOGRAPHY  02/10/2011   95% prox in-stent restenosis within OM stent  (Dr. IVAR Sor)   LEFT HEART CATHETERIZATION WITH CORONARY ANGIOGRAM N/A 07/17/2011   Procedure: LEFT HEART CATHETERIZATION WITH CORONARY ANGIOGRAM;  Surgeon:  Alm LELON Clay, MD;  Location: [facility] CATH LAB;  Service: Cardiovascular;  Laterality: N/A;  Right radial approach;  90% ISR of BMS (5 months post PTCA for ISR) --> DES PCI   left knee arthroscopy  05/2016   NM MYOCAR PERF WALL MOTION  09/08/2013   abnormal lexiscan  - low to intermediate risk;    POLYPECTOMY  09/08/2019   Procedure: POLYPECTOMY;  Surgeon: Shaaron Lamar HERO, MD;  [facility];  Service: Endoscopy;;   TOOTH EXTRACTION Right 06/19/2020   TRANSTHORACIC ECHOCARDIOGRAM  09/01/2017   Normal LV size and function.  EF 66 5%.  Normal wall motion.  GR 1 DD.  Mild aortic sclerosis.  Aortic root mildly dilated at 40 mm.   Family History  Problem Relation Age of Onset   Arrhythmia Mother 1   Early death Son    Colon cancer Neg Hx    Liver disease Neg Hx    Inflammatory bowel disease Neg Hx    Colon polyps Neg Hx  Social History   Occupational History   Occupation: Retired    Associate Professor: [employer]  Tobacco Use   Smoking status: Former    Current packs/day: 0.00    Average packs/day: 1 pack/day for 30.0 years (30.0 ttl pk-yrs)    Types: Cigarettes    Start date: 05/11/1969    Quit date: 05/12/1999    Years since quitting: 25.3    Passive exposure: Past   Smokeless tobacco: Never  Vaping Use   Vaping status: Never Used  Substance and Sexual Activity   Alcohol use: No   Drug use: No   Sexual activity: Yes   Tobacco Counseling Counseling given: Yes  SDOH Screenings   Food Insecurity: No Food Insecurity (08/31/2024)  Housing: Low Risk (08/31/2024)  Transportation Needs: No Transportation Needs (08/31/2024)  Utilities: Not At Risk (08/31/2024)  Alcohol Screen: Low Risk (09/15/2022)  Depression (PHQ2-9): Low Risk (08/31/2024)  Financial Resource Strain: Low Risk (09/15/2022)  Physical Activity: Sufficiently Active (08/31/2024)  Social Connections: Moderately Integrated (08/31/2024)  Stress: No Stress Concern Present (08/31/2024)  Tobacco Use: Medium Risk (08/31/2024)   Health Literacy: Adequate Health Literacy (08/31/2024)   See flowsheets for full screening details  Depression Screen PHQ 2 & 9 Depression Scale- Over the past 2 weeks, how often have you been bothered by any of the following problems? Little interest or pleasure in doing things: 0 Feeling down, depressed, or hopeless (PHQ Adolescent also includes...irritable): 0 PHQ-2 Total Score: 0 Trouble falling or staying asleep, or sleeping too much: 0 Feeling tired or having little energy: 0 Poor appetite or overeating (PHQ Adolescent also includes...weight loss): 0 Feeling bad about yourself - or that you are a failure or have let yourself or your family down: 0 Trouble concentrating on things, such as reading the newspaper or watching television (PHQ Adolescent also includes...like school work): 0 Moving or speaking so slowly that other people could have noticed. Or the opposite - being so fidgety or restless that you have been moving around a lot more than usual: 0 Thoughts that you would be better off dead, or of hurting yourself in some way: 0 PHQ-9 Total Score: 0 If you checked off any problems, how difficult have these problems made it for you to do your work, take care of things at home, or get along with other people?: Not difficult at all  Depression Treatment Depression Interventions/Treatment : [id number] Score <4 Follow-up Not Indicated     Goals Addressed               This Visit's Progress     I just want to be as healthy as possible, keep moving around and keep my mobility (pt-stated)        Stay healthy             Objective:    Today's Vitals   08/31/24 0845  BP: 113/64  Weight: 192 lb (87.1 kg)  Height: 6' (1.829 m)   Body mass index is 26.04 kg/m.  Hearing/Vision screen Hearing Screening - Comments:: Patient denies any hearing difficulties.   Vision Screening - Comments:: Wears rx glasses - up to date with routine eye exams with  [facility] Gaudy in  [place] Immunizations and Health Maintenance Health Maintenance  Topic Date Due   Medicare Annual Wellness (AWV)  09/16/2023   COVID-19 Vaccine (8 - 2025-26 season) 04/11/2024   Diabetic kidney evaluation - Urine ACR  10/17/2024   HEMOGLOBIN A1C  10/17/2024   OPHTHALMOLOGY EXAM  11/08/2024  Diabetic kidney evaluation - eGFR measurement  04/19/2025   FOOT EXAM  04/19/2025   DTaP/Tdap/Td (3 - Td or Tdap) 06/20/2030   Pneumococcal Vaccine: 50+ Years  Completed   Influenza Vaccine  Completed   Hepatitis C Screening  Completed   Zoster Vaccines- Shingrix  Completed   Meningococcal B Vaccine  Aged Out   Colonoscopy  Discontinued        Assessment/Plan:  This is a routine wellness examination for [name].  Patient Care Team: Tobie Suzzane POUR, MD as PCP - General (Internal Medicine) Octavia Bruckner, MD as Consulting Physician (Ophthalmology) [facility] Court Dorn PARAS, MD as Consulting Physician (Cardiology)  I have personally reviewed and noted the following in the patients chart:   Medical and social history Use of alcohol, tobacco or illicit drugs  Current medications and supplements including opioid prescriptions. Functional ability and status Nutritional status Physical activity Advanced directives List of other physicians Hospitalizations, surgeries, and ER visits in previous 12 months Vitals Screenings to include cognitive, depression, and falls Referrals and appointments  No orders of the defined types were placed in this encounter.  In addition, I have reviewed and discussed with patient certain preventive protocols, quality metrics, and best practice recommendations. A written personalized care plan for preventive services as well as general preventive health recommendations were provided to patient.   Athenia Rys, CMA   08/31/2024   Return September 01, 2025 at 1:10pm, for In office Medicare Well Visit w  Wellness Nurse.  After Visit  Summary: (MyChart) Due to this being a telephonic visit, the after visit summary with patients personalized plan was offered to patient via MyChart    "

## 2024-08-31 NOTE — Patient Instructions (Signed)
 [name],  Thank you for taking the time for your Medicare Wellness Visit. I appreciate your continued commitment to your health goals. Please review the care plan we discussed, and feel free to reach out if I can assist you further.  Please note that Annual Wellness Visits do not include a physical exam. Some assessments may be limited, especially if the visit was conducted virtually. If needed, we may recommend an in-person follow-up with your provider.  Ongoing Care Seeing your primary care provider every 3 to 6 months helps us  monitor your health and provide consistent, personalized care.   Referrals If a referral was made during today's visit and you haven't received any updates within two weeks, please contact the referred provider directly to check on the status.  Recommended Screenings:  Health Maintenance  Topic Date Due   Medicare Annual Wellness Visit  09/16/2023   COVID-19 Vaccine (8 - 2025-26 season) 04/11/2024   Kidney health urinalysis for diabetes  10/17/2024   Hemoglobin A1C  10/17/2024   Eye exam for diabetics  11/08/2024   Yearly kidney function blood test for diabetes  04/19/2025   Complete foot exam   04/19/2025   DTaP/Tdap/Td vaccine (3 - Td or Tdap) 06/20/2030   Pneumococcal Vaccine for age over 68  Completed   Flu Shot  Completed   Hepatitis C Screening  Completed   Zoster (Shingles) Vaccine  Completed   Meningitis B Vaccine  Aged Out   Colon Cancer Screening  Discontinued       08/31/2024    8:49 AM  Advanced Directives  Does Patient Have a Medical Advance Directive? No  Would patient like information on creating a medical advance directive? Yes (MAU/Ambulatory/Procedural Areas - Information given)    Vision: Annual vision screenings are recommended for early detection of glaucoma, cataracts, and diabetic retinopathy. These exams can also reveal signs of chronic conditions such as diabetes and high blood pressure.  Dental: Annual dental screenings  help detect early signs of oral cancer, gum disease, and other conditions linked to overall health, including heart disease and diabetes.  Please see the attached documents for additional preventive care recommendations.

## 2024-09-20 ENCOUNTER — Ambulatory Visit: Admitting: Internal Medicine

## 2024-11-10 ENCOUNTER — Encounter (HOSPITAL_COMMUNITY)

## 2025-09-01 ENCOUNTER — Ambulatory Visit: Payer: Self-pay
